# Patient Record
Sex: Female | Born: 1945
Health system: Southern US, Community
[De-identification: ages and names within clinical notes are randomized; demographics above are authoritative.]

## PROBLEM LIST (undated history)

## (undated) DIAGNOSIS — K219 Gastro-esophageal reflux disease without esophagitis: Secondary | ICD-10-CM

## (undated) DIAGNOSIS — C801 Malignant (primary) neoplasm, unspecified: Secondary | ICD-10-CM

## (undated) DIAGNOSIS — R112 Nausea with vomiting, unspecified: Secondary | ICD-10-CM

## (undated) DIAGNOSIS — F419 Anxiety disorder, unspecified: Secondary | ICD-10-CM

## (undated) DIAGNOSIS — J189 Pneumonia, unspecified organism: Secondary | ICD-10-CM

## (undated) DIAGNOSIS — R7303 Prediabetes: Secondary | ICD-10-CM

## (undated) DIAGNOSIS — K635 Polyp of colon: Secondary | ICD-10-CM

## (undated) DIAGNOSIS — E785 Hyperlipidemia, unspecified: Secondary | ICD-10-CM

## (undated) DIAGNOSIS — K5790 Diverticulosis of intestine, part unspecified, without perforation or abscess without bleeding: Secondary | ICD-10-CM

## (undated) DIAGNOSIS — Z9889 Other specified postprocedural states: Secondary | ICD-10-CM

## (undated) DIAGNOSIS — R002 Palpitations: Secondary | ICD-10-CM

## (undated) DIAGNOSIS — R000 Tachycardia, unspecified: Secondary | ICD-10-CM

## (undated) DIAGNOSIS — T7840XA Allergy, unspecified, initial encounter: Secondary | ICD-10-CM

## (undated) DIAGNOSIS — K222 Esophageal obstruction: Secondary | ICD-10-CM

## (undated) DIAGNOSIS — D649 Anemia, unspecified: Secondary | ICD-10-CM

## (undated) HISTORY — PX: UPPER GASTROINTESTINAL ENDOSCOPY: SHX188

## (undated) HISTORY — DX: Anxiety disorder, unspecified: F41.9

## (undated) HISTORY — DX: Prediabetes: R73.03

## (undated) HISTORY — PX: OTHER SURGICAL HISTORY: SHX169

## (undated) HISTORY — PX: COLONOSCOPY: SHX174

## (undated) HISTORY — DX: Tachycardia, unspecified: R00.0

## (undated) HISTORY — DX: Anemia, unspecified: D64.9

## (undated) HISTORY — PX: POLYPECTOMY: SHX149

## (undated) HISTORY — DX: Allergy, unspecified, initial encounter: T78.40XA

## (undated) HISTORY — DX: Hyperlipidemia, unspecified: E78.5

## (undated) HISTORY — PX: BREAST BIOPSY: SHX20

## (undated) HISTORY — PX: EYE SURGERY: SHX253

## (undated) HISTORY — PX: TONSILLECTOMY: SUR1361

## (undated) HISTORY — DX: Malignant (primary) neoplasm, unspecified: C80.1

## (undated) HISTORY — DX: Gastro-esophageal reflux disease without esophagitis: K21.9

## (undated) HISTORY — DX: Esophageal obstruction: K22.2

## (undated) HISTORY — DX: Polyp of colon: K63.5

## (undated) HISTORY — DX: Diverticulosis of intestine, part unspecified, without perforation or abscess without bleeding: K57.90

## (undated) HISTORY — DX: Palpitations: R00.2

---

## 1998-10-20 ENCOUNTER — Other Ambulatory Visit: Admission: RE | Admit: 1998-10-20 | Discharge: 1998-10-20 | Payer: Self-pay | Admitting: Obstetrics and Gynecology

## 1998-12-25 ENCOUNTER — Encounter: Payer: Self-pay | Admitting: Obstetrics and Gynecology

## 1998-12-25 ENCOUNTER — Ambulatory Visit (HOSPITAL_COMMUNITY): Admission: RE | Admit: 1998-12-25 | Discharge: 1998-12-25 | Payer: Self-pay | Admitting: Obstetrics and Gynecology

## 1999-05-16 ENCOUNTER — Encounter: Payer: Self-pay | Admitting: Obstetrics and Gynecology

## 1999-05-16 ENCOUNTER — Ambulatory Visit (HOSPITAL_COMMUNITY): Admission: RE | Admit: 1999-05-16 | Discharge: 1999-05-16 | Payer: Self-pay | Admitting: Obstetrics and Gynecology

## 1999-06-29 ENCOUNTER — Encounter (INDEPENDENT_AMBULATORY_CARE_PROVIDER_SITE_OTHER): Payer: Self-pay

## 1999-06-29 ENCOUNTER — Other Ambulatory Visit: Admission: RE | Admit: 1999-06-29 | Discharge: 1999-06-29 | Payer: Self-pay | Admitting: Obstetrics and Gynecology

## 1999-12-17 ENCOUNTER — Other Ambulatory Visit: Admission: RE | Admit: 1999-12-17 | Discharge: 1999-12-17 | Payer: Self-pay | Admitting: Obstetrics and Gynecology

## 2000-12-18 ENCOUNTER — Other Ambulatory Visit: Admission: RE | Admit: 2000-12-18 | Discharge: 2000-12-18 | Payer: Self-pay | Admitting: Obstetrics and Gynecology

## 2001-12-21 ENCOUNTER — Other Ambulatory Visit: Admission: RE | Admit: 2001-12-21 | Discharge: 2001-12-21 | Payer: Self-pay | Admitting: Obstetrics and Gynecology

## 2002-03-17 ENCOUNTER — Encounter: Payer: Self-pay | Admitting: Internal Medicine

## 2002-12-15 ENCOUNTER — Encounter: Payer: Self-pay | Admitting: Internal Medicine

## 2002-12-15 ENCOUNTER — Ambulatory Visit (HOSPITAL_COMMUNITY): Admission: RE | Admit: 2002-12-15 | Discharge: 2002-12-15 | Payer: Self-pay | Admitting: Internal Medicine

## 2002-12-23 ENCOUNTER — Ambulatory Visit (HOSPITAL_COMMUNITY): Admission: RE | Admit: 2002-12-23 | Discharge: 2002-12-23 | Payer: Self-pay | Admitting: Internal Medicine

## 2002-12-23 ENCOUNTER — Encounter: Payer: Self-pay | Admitting: Internal Medicine

## 2002-12-27 ENCOUNTER — Other Ambulatory Visit: Admission: RE | Admit: 2002-12-27 | Discharge: 2002-12-27 | Payer: Self-pay | Admitting: Obstetrics and Gynecology

## 2004-03-01 ENCOUNTER — Other Ambulatory Visit: Admission: RE | Admit: 2004-03-01 | Discharge: 2004-03-01 | Payer: Self-pay | Admitting: Obstetrics and Gynecology

## 2005-03-27 ENCOUNTER — Encounter: Payer: Self-pay | Admitting: Internal Medicine

## 2005-09-04 ENCOUNTER — Other Ambulatory Visit: Admission: RE | Admit: 2005-09-04 | Discharge: 2005-09-04 | Payer: Self-pay | Admitting: Obstetrics and Gynecology

## 2006-09-08 ENCOUNTER — Other Ambulatory Visit: Admission: RE | Admit: 2006-09-08 | Discharge: 2006-09-08 | Payer: Self-pay | Admitting: Obstetrics & Gynecology

## 2007-01-12 ENCOUNTER — Encounter (HOSPITAL_COMMUNITY): Admission: RE | Admit: 2007-01-12 | Discharge: 2007-03-25 | Payer: Self-pay | Admitting: Internal Medicine

## 2007-09-11 ENCOUNTER — Other Ambulatory Visit: Admission: RE | Admit: 2007-09-11 | Discharge: 2007-09-11 | Payer: Self-pay | Admitting: Obstetrics & Gynecology

## 2008-04-08 ENCOUNTER — Ambulatory Visit (HOSPITAL_COMMUNITY): Admission: RE | Admit: 2008-04-08 | Discharge: 2008-04-08 | Payer: Self-pay | Admitting: Internal Medicine

## 2008-04-08 ENCOUNTER — Encounter (INDEPENDENT_AMBULATORY_CARE_PROVIDER_SITE_OTHER): Payer: Self-pay | Admitting: *Deleted

## 2008-09-15 ENCOUNTER — Other Ambulatory Visit: Admission: RE | Admit: 2008-09-15 | Discharge: 2008-09-15 | Payer: Self-pay | Admitting: Obstetrics & Gynecology

## 2009-06-28 ENCOUNTER — Ambulatory Visit: Payer: Self-pay | Admitting: Internal Medicine

## 2009-06-28 DIAGNOSIS — K219 Gastro-esophageal reflux disease without esophagitis: Secondary | ICD-10-CM

## 2009-06-28 DIAGNOSIS — Z860100 Personal history of colon polyps, unspecified: Secondary | ICD-10-CM | POA: Insufficient documentation

## 2009-06-29 ENCOUNTER — Encounter: Payer: Self-pay | Admitting: Internal Medicine

## 2009-06-29 ENCOUNTER — Ambulatory Visit: Payer: Self-pay | Admitting: Internal Medicine

## 2009-06-30 ENCOUNTER — Encounter: Payer: Self-pay | Admitting: Internal Medicine

## 2010-06-25 ENCOUNTER — Ambulatory Visit (HOSPITAL_COMMUNITY): Admission: EM | Admit: 2010-06-25 | Discharge: 2010-06-25 | Payer: Self-pay | Admitting: Emergency Medicine

## 2010-06-25 ENCOUNTER — Ambulatory Visit: Payer: Self-pay | Admitting: Gastroenterology

## 2010-06-26 ENCOUNTER — Telehealth: Payer: Self-pay | Admitting: Internal Medicine

## 2010-06-26 ENCOUNTER — Telehealth (INDEPENDENT_AMBULATORY_CARE_PROVIDER_SITE_OTHER): Payer: Self-pay | Admitting: *Deleted

## 2010-06-27 ENCOUNTER — Encounter (INDEPENDENT_AMBULATORY_CARE_PROVIDER_SITE_OTHER): Payer: Self-pay | Admitting: *Deleted

## 2010-06-27 ENCOUNTER — Encounter: Payer: Self-pay | Admitting: Gastroenterology

## 2010-06-27 DIAGNOSIS — K222 Esophageal obstruction: Secondary | ICD-10-CM | POA: Insufficient documentation

## 2010-07-12 ENCOUNTER — Encounter: Payer: Self-pay | Admitting: Gastroenterology

## 2010-07-12 ENCOUNTER — Ambulatory Visit (HOSPITAL_COMMUNITY): Admission: RE | Admit: 2010-07-12 | Discharge: 2010-07-12 | Payer: Self-pay | Admitting: Gastroenterology

## 2010-07-12 ENCOUNTER — Encounter (INDEPENDENT_AMBULATORY_CARE_PROVIDER_SITE_OTHER): Payer: Self-pay | Admitting: *Deleted

## 2010-08-24 ENCOUNTER — Ambulatory Visit: Payer: Self-pay | Admitting: Internal Medicine

## 2011-01-08 NOTE — Assessment & Plan Note (Signed)
Summary: GERD and stricture dilation followup   History of Present Illness Visit Type: Follow-up Visit Primary GI MD: Yancey Flemings MD Primary Provider: Lucky Cowboy, MD Requesting Provider: n/a Chief Complaint: follow up after procedure, pt began taking only one prilosec daily while on a cruise.  She is doing well on only one prilosec History of Present Illness:   65 year old female withhistory of hypertension, hyperlipidemia, and GERD. She was seen by Dr. Christella Hartigan on an emergent basis June 24, 2010 with an esophageal meat impaction. This was treated endoscopically. She does have chronic GERD symptoms and was placed on Prilosec. She subsequently underwent upper endoscopy with balloon dilation to 18 mm, by Dr. Christella Hartigan, on August 4. Since that time she has done well without difficulty swallowing. She continues on Prilosec 20 mg daily. She is glad to report no heartburn or indigestion as well as new recurrent dysphagia. Her most recent colonoscopy was performed July 2010. Hyperplastic polyp and mild diverticulosis only.   GI Review of Systems    Reports acid reflux.      Denies abdominal pain, belching, bloating, chest pain, dysphagia with liquids, dysphagia with solids, heartburn, loss of appetite, nausea, vomiting, vomiting blood, weight loss, and  weight gain.      Reports diverticulosis.     Denies anal fissure, black tarry stools, change in bowel habit, constipation, diarrhea, fecal incontinence, heme positive stool, hemorrhoids, irritable bowel syndrome, jaundice, light color stool, liver problems, rectal bleeding, and  rectal pain.    Current Medications (verified): 1)  Atenolol 25 Mg Tabs (Atenolol) .... 1/2 Tablet By Mouth Once Daily 2)  Klonopin 1 Mg Tabs (Clonazepam) .... One Tablet By Mouth At Night 3)  Calcium 500 Mg Tabs (Calcium Carbonate) .... One Tablet By Mouth Once Daily 4)  Vitamin D 400 Unit Tabs (Cholecalciferol) .... One Tablet By Mouth Once Daily 5)  Fish Oil   Oil (Fish  Oil) .... One Tablet By Mouth Once Daily 6)  Multivitamins   Tabs (Multiple Vitamin) .... One Tablet By Mouth Once Daily 7)  Zetia 10 Mg Tabs (Ezetimibe) .... One Tablet By Mouth Once Daily 8)  Anafranil 50 Mg Caps (Clomipramine Hcl) .... Take 1 Capsule By Mouth Once A Day 9)  Prilosec 20 Mg Cpdr (Omeprazole) .... Take 1 Capsule By Mouth Once A Day 10)  Magnesium 30 Mg Tabs (Magnesium) .... Take 1 Tablet By Mouth Once A Day  Allergies (verified): 1)  ! Penicillin  Past History:  Past Medical History: Adenomatous Colon Polyps Hypertension Hyperthyroidism Irritable Bowel Syndrome Urinary Tract Infection Esophageal Stricture  Past Surgical History: c-section eye lift  Tonsillectomy  Family History: Reviewed history from 06/28/2009 and no changes required. Family History of Colon Cancer:Maternal Aunt and Paternal Aunt  Family History of Diabetes: Half sister  Family History of Kidney Disease:Father  Family History of Heart Disease: Mother   Social History: Reviewed history from 06/28/2009 and no changes required. Occupation: Retired Married 1 boy Patient has never smoked.  Alcohol Use - no Daily Caffeine Use Illicit Drug Use - no Patient does not get regular exercise.   Review of Systems  The patient denies allergy/sinus, anemia, anxiety-new, arthritis/joint pain, back pain, blood in urine, breast changes/lumps, confusion, cough, coughing up blood, depression-new, fainting, fatigue, fever, headaches-new, hearing problems, heart murmur, heart rhythm changes, itching, menstrual pain, muscle pains/cramps, night sweats, nosebleeds, pregnancy symptoms, shortness of breath, skin rash, sleeping problems, sore throat, swelling of feet/legs, swollen lymph glands, thirst - excessive, urination - excessive, urination changes/pain,  urine leakage, vision changes, and voice change.    Vital Signs:  Patient profile:   65 year old female Height:      67 inches Weight:      166  pounds BMI:     26.09 Pulse rate:   76 / minute Pulse rhythm:   regular BP sitting:   86 / 60  (left arm) Cuff size:   regular  Vitals Entered By: Francee Piccolo CMA Duncan Dull) (August 24, 2010 2:39 PM)  Physical Exam  General:  Well developed, well nourished, no acute distress. Head:  Normocephalic and atraumatic. Eyes:  PERRLA, no icterus. Nose:  No deformity, discharge,  or lesions. Mouth:  No deformity or lesions, dentition normal. Neck:  Supple; no masses or thyromegaly. Lungs:  Clear throughout to auscultation. Heart:  Regular rate and rhythm; no murmurs, rubs,  or bruits. Abdomen:  Soft, nontender and nondistended. No masses, hepatosplenomegaly or hernias noted. Normal bowel sounds. Msk:  normal posture Pulses:  Normal pulses noted. Extremities:  no edema Neurologic:  Alert and  oriented x4. Skin:  Intact without significant lesions or rashes. Psych:  Alert and cooperative. Normal mood and affect.   Impression & Recommendations:  Problem # 1:  GERD (ICD-530.81) chronic GERD complicated by peptic stricture. Symptoms of heartburn resolved on PPI therapy.  Plan: #1. Omeprazole 20 mg daily. A prescription with multiple refills submitted electronically #2. Discussion on the pathophysiology and treatment of GERD #3. Literature on GERD provided for review #4. Reflux precautions #5. Routine followup in one year. Sooner for breakthrough symptoms or other problems  Problem # 2:  ESOPHAGEAL STRICTURE (ICD-530.3) peptic stricture complicated by a food impaction. Status post endoscopic removal of food impaction followed by endoscopic balloon dilation of the esophagus  Plan: #1. Continue PPI daily to reduce the risk of premature stricture recurrence #2. Chew food well #3. Contact the office should problems with dysphagia return  Problem # 3:  SCREENING COLORECTAL-CANCER (ICD-V76.51) up-to-date. Hyperplastic polyp and mild diverticulosis on colonoscopy July 2010  Patient  Instructions: 1)  Omeprazole 20 mg #30 x 11 RFs. take 1 by mouth once daily 2)  GI Reflux brochure given.  3)  Esophagitis and Stricture brochure given. 4)  Copy sent to : Lucky Cowboy, MD 5)  The medication list was reviewed and reconciled.  All changed / newly prescribed medications were explained.  A complete medication list was provided to the patient / caregiver. Prescriptions: PRILOSEC 20 MG CPDR (OMEPRAZOLE) Take 1 capsule by mouth once a day  #30 x 11   Entered by:   Milford Cage NCMA   Authorized by:   Hilarie Fredrickson MD   Signed by:   Milford Cage NCMA on 08/24/2010   Method used:   Electronically to        [facility]* (retail)       [address]       [place], [place]  [id number]       Ph: [phone]       Fax: [phone]   RxID:   [id number]

## 2011-01-08 NOTE — Progress Notes (Signed)
Summary: [facility]  ---- Converted from flag ---- ---- 06/26/2010 1:42 PM, Hilarie Fredrickson MD wrote: yes, thank you. Make sure she stays on PPI twice daily. Schedule her for EGD with dilation and [facility] in 2-4 weeks. Thanks  ---- 06/26/2010 1:29 PM, Chales Abrahams CMA (AAMA) wrote: Lorain Childes:  Dr Marina Goodell this is a pt of yours that Dr Christella Hartigan saw in the hospital would you like to have her scheduled with you.  ---- 06/26/2010 12:55 PM, Rachael Fee MD wrote: yes, can you show him the EGD report, he will probably want to dilate her but often does these at [facility].  I wasn't aware Marina Goodell was her doc.  ---- 06/26/2010 9:09 AM, Chales Abrahams CMA (AAMA) wrote: Dr Christella Hartigan this is a Dr Marina Goodell pt do you want to schedule the repeat EGD DIL with you or with Dr Marina Goodell? ------------------------------

## 2011-01-08 NOTE — Letter (Signed)
Summary: EGD Instructions  [facility]  [address] [place], [place] [address]   Phone: [phone]  Fax: [phone]       [name]    21-Jul-1946    MRN: [id number]       Procedure Day /Date:07/12/10     Arrival Time: 8am     Procedure Time:9am     Location of Procedure:                    _  _ [facility] (4th Floor) _ X _ [facility] ( Outpatient Registration) _  _ [facility] (Short Stay on [address]   PREPARATION FOR ENDOSCOPY   On Thursday THE DAY OF THE PROCEDURE:  1.   No solid foods, milk or milk products are allowed after midnight the night before your procedure.  2.   Do not drink anything colored red or purple.  Avoid juices with pulp.  No orange juice.  3.  You may drink clear liquids unti 5 a.m. which is 4 hours before your procedure.                                                                                                CLEAR LIQUIDS INCLUDE: Water Jello Ice Popsicles Tea (sugar ok, no milk/cream) Powdered fruit flavored drinks Coffee (sugar ok, no milk/cream) Gatorade Juice: apple, white grape, white cranberry  Lemonade Clear bullion, consomm, broth Carbonated beverages (any kind) Strained chicken noodle soup Hard Candy   MEDICATION INSTRUCTIONS  Unless otherwise instructed, you should take regular prescription medications with a small sip of water as early as possible the morning of your procedure.            OTHER INSTRUCTIONS  You will need a responsible adult at least 64 years of age to accompany you and drive you home.   This person must remain in the waiting room during your procedure.  Wear loose fitting clothing that is easily removed.  Leave jewelry and other valuables at home.  However, you may wish to bring a book to read or an iPod/MP3 player to listen to music as you wait for your procedure to start.  Remove all body piercing jewelry and leave at  home.  Total time from sign-in until discharge is approximately 2-3 hours.  You should go home directly after your procedure and rest.  You can resume normal activities the day after your procedure.  The day of your procedure you should not:   Drive   Make legal decisions   Operate machinery   Drink alcohol   Return to work  You will receive specific instructions about eating, activities and medications before you leave.    The above instructions have been reviewed and explained to me by   Fischer Halley-mailed to pt.-06/27/10

## 2011-01-08 NOTE — Letter (Signed)
Summary: Appt Reminder 2  [facility]  [address] [place], [place] [address]   Phone: [phone]  Fax: [phone]        July 12, 2010 MRN: [id number]    Donna Dillon [address] [place], [place]  [address]    Dear [name],   You have a return appointment with Dr. Marina Goodell on 08/20/10 at 10:15 am.  Please remember to bring a complete list of the medicines you are taking, your insurance card and your co-pay.  If you have to cancel or reschedule this appointment, please call before 5:00 pm the evening before to avoid a cancellation fee.  If you have any questions or concerns, please call [phone].    Sincerely,    Chales Abrahams CMA (AAMA)  Appended Document: Appt Reminder 2 letter mailed

## 2011-01-08 NOTE — Procedures (Signed)
Summary: Upper Endoscopy  Patient: Donna Dillon Note: All result statuses are Final unless otherwise noted.  Tests: (1) Upper Endoscopy (EGD)   EGD Upper Endoscopy       DONE     [facility]     [address]     [place], [place]  [address]           ENDOSCOPY PROCEDURE REPORT           PATIENT:  [name], [name]  MR#:  [id number]     BIRTHDATE:  01-22-1946  GENDER:  female     ENDOSCOPIST:  Rachael Fee, MD     PROCEDURE DATE:  06/25/2010     PROCEDURE:  EGD with foreign body removal     ASA CLASS:  Class II     INDICATIONS:  esophageal food impaction     MEDICATIONS:  Versed 6 mg IV, Fentanyl 50 mcg IV, Benadryl 50 mg     IV     TOPICAL ANESTHETIC:  Exactacain Spray           DESCRIPTION OF PROCEDURE:   After the risks benefits and     alternatives of the procedure were thoroughly explained, informed     consent was obtained.  The  endoscope was introduced through the     mouth and advanced to the second portion of the duodenum, without     limitations.  The instrument was slowly withdrawn as the mucosa     was fully examined.     <<PROCEDUREIMAGES>>           There was a large amount of soft, whitish meat impacted in her mid     to distal esophagus. This was gently pushed into the stomach with     passage of gastroscope. There was noted to be a 3-4cm hiatal     hernia. Above the hernia, there was a thick Schatzki's ring vs a     focal peptic stricture. The GE junction was quite tortuous and     there was clearly inflammed GE junction, very friable from recent     food impaction (see image1, image2, image3, image4, image5,     image6, and image7).  Otherwise the examination was normal (see     image8, image9, image10, and image11).    Retroflexed views     revealed no abnormalities.    The scope was then withdrawn from     the patient and the procedure completed.           COMPLICATIONS:  None           ENDOSCOPIC IMPRESSION:     1) Food impaction  in esophagus, treated.   Schaztki's ring vs.     thick peptic stricture.  Hiatal hernia.     2) Otherwise normal examination           RECOMMENDATIONS:     Liquid diet for next 24-48 hours.     Twice daily prilosec (OTC), best taken 20-30 min before     breakfast and dinner meals.     EAT SLOWLY AND CHEW VERY WELL.     Dr. Christella Hartigan office will get in touch to repeat EGD with dilation     in 2-3 weeks at [facility].           ______________________________     Rachael Fee, MD           cc:  Chrissie Noa  [place]           n.     eSIGNED:   Rachael Fee at 06/25/2010 09:35 PM           Page 2 of 3   [place], [id number]  Note: An exclamation mark (!) indicates a result that was not dispersed into the flowsheet. Document Creation Date: 06/25/2010 9:50 PM _______________________________________________________________________  (1) Order result status: Final Collection or observation date-time: 06/25/2010 21:26 Requested date-time:  Receipt date-time:  Reported date-time:  Referring Physician:   Ordering Physician: Rob Bunting [phone]) Specimen Source:  Source: Launa Grill Order Number: [id number] Lab site:   Appended Document: Upper Endoscopy Dr Marina Goodell pt see phone note from 06/26/10

## 2011-01-08 NOTE — Miscellaneous (Signed)
Summary: egd/dil  Clinical Lists Changes  Problems: Added new problem of ESOPHAGEAL STRICTURE (ICD-530.3) Orders: Added new Test order of ZEGD Balloon Dil (ZEGD Balloon) - Signed

## 2011-01-08 NOTE — Progress Notes (Signed)
Summary: f/u EGD  Phone Note Call from Patient Call back at Home Phone [phone]   Caller: Patient Call For: Dr. Marina Goodell Reason for Call: Talk to Nurse Summary of Call: would like to sch f/u EGD asap Initial call taken by: Vallarie Mare,  June 26, 2010 2:55 PM  Follow-up for Phone Call        I scheduled this pt. for next available which is 07/30/10 in [facility] however she is going out of  the country on 07/23/10 and also is saying it has to be done at hospital because she had to pay $600.00 when you did her colonoscopy here. Follow-up by: Teryl Lucy RN,  June 26, 2010 3:18 PM  Additional Follow-up for Phone Call Additional follow up Details #1::        have our business people look into the payment issues to see if the [facility] is an option. I have no routine hospital availability until October. Additional Follow-up by: Hilarie Fredrickson MD,  June 26, 2010 3:33 PM    Additional Follow-up for Phone Call Additional follow up Details #2::    I checked with [facility] in pre-cert. and pt. has $600.00 deductible regardless of  whether procedure is done here or at [facility] procedure and her deductible just started over 06/08/2010. Pt. is flying to [place] then a 3 week cruise in the [place] and is asking if someone else could do dil. as she is very concerned that if choking would re-occur on ship she would not get adequate care  there or in another country. Follow-up by: Teryl Lucy RN,  June 26, 2010 4:04 PM  Additional Follow-up for Phone Call Additional follow up Details #3:: Details for Additional Follow-up Action Taken: I guess that you could check with Dr. Christella Hartigan since he saw her recently Additional Follow-up by: Hilarie Fredrickson MD,  June 26, 2010 4:17 PM  Dr.Jacobs would you consider doing this pt's. dil before she leaves the country on 812/11 since Dr.Cayenne Breault has no openings?    I can do this on thursday, august 4th, EGD with balloon dilation at [facility]. Rachael Fee MD  June 27, 2010 10:42  AM  Appended Document: f/u EGD Pt. scheduled at [facility] for dil with Dr.Jacobs.Is aware and instructions reviwed and mailed to pt.

## 2011-01-08 NOTE — Procedures (Signed)
Summary: Upper Endoscopy  Patient: Donna Dillon Note: All result statuses are Final unless otherwise noted.  Tests: (1) Upper Endoscopy (EGD)   EGD Upper Endoscopy       DONE     [facility]     [address] [place], [place]  [address]           ENDOSCOPY PROCEDURE REPORT           PATIENT:  Donna Dillon, Donna Dillon  MR#:  [id number]     BIRTHDATE:  1946-07-14, 64 yrs. old  GENDER:  female     ENDOSCOPIST:  Rachael Fee, MD     PROCEDURE DATE:  07/12/2010     PROCEDURE:  EGD with balloon dilatation     ASA CLASS:  Class II     INDICATIONS:  intermittent dysphagia, chronic GERD; recent food     impaction 3 weeks ago treated endoscopically     MEDICATIONS:  Fentanyl 75 mcg IV, Versed 8 mg IV, Benadryl 50 mg     IV     TOPICAL ANESTHETIC:  Cetacaine Spray           DESCRIPTION OF PROCEDURE:   After the risks benefits and     alternatives of the procedure were thoroughly explained, informed     consent was obtained.  The  endoscope was introduced through the     mouth and advanced to the stomach antrum, without limitations.     The instrument was slowly withdrawn as the mucosa was fully     examined.     <<PROCEDUREIMAGES>>     A Schatzki's ring was found. This was dilated with CRE TTS balloon     held inflated to 18mm for 60 seconds. There was the usual minor     superficial mucosal tear and self limited oozing of blood (see     image3, image10, and image11).  A hiatal hernia was found. This     was 4-5cm (see image5 and image2). There was a moderate amount of     retained solid food in stomach without anatomic gastric outlet     obstruction (see image4).    Retroflexed views revealed no     abnormalities.    The scope was then withdrawn from the patient     and the procedure completed.     COMPLICATIONS:  None           ENDOSCOPIC IMPRESSION:     1) Schatzki's ring, dilated up to 18mm with CRE TTS balloon     2) 4-5cm hiatal hernia     3) Moderate amount of  retained solid food in stomach without     anatomic gastric outlet obstruction           RECOMMENDATIONS:     Continue on twice daily PPI (prilosec taken 20-30 min prior to     breakfast and dinner meals).     Continue to chew food well, eat slowly.     Will arrange for follow up appt with Dr. Marina Goodell in the office in     5-6 weeks to check on response to this dilation and schedule     repeat dilation if needed.  This will be after your vacation trip.                 ______________________________     Rachael Fee, MD           cc: Yancey Flemings,  MD; Lucky Cowboy, MD           n.     Rosalie Doctor:   Rachael Fee at 07/12/2010 08:51 AM           [place], [id number]  Note: An exclamation mark (!) indicates a result that was not dispersed into the flowsheet. Document Creation Date: 07/12/2010 8:52 AM _______________________________________________________________________  (1) Order result status: Final Collection or observation date-time: 07/12/2010 08:42 Requested date-time:  Receipt date-time:  Reported date-time:  Referring Physician:   Ordering Physician: Rob Bunting [phone]) Specimen Source:  Source: Launa Grill Order Number: [id number] Lab site:   Appended Document: Upper Endoscopy patty, can you set up ROV with Dr. Marina Goodell in 5-6 weeks  Appended Document: Upper Endoscopy Appt scheduled and letter mailed to pt

## 2011-02-23 LAB — COMPREHENSIVE METABOLIC PANEL
AST: 16 U/L (ref 0–37)
Albumin: 4.1 g/dL (ref 3.5–5.2)
Alkaline Phosphatase: 73 U/L (ref 39–117)
Chloride: 105 mEq/L (ref 96–112)
GFR calc Af Amer: >60 mL/min (ref >60)
Potassium: 3.6 mEq/L (ref 3.5–5.1)
Total Bilirubin: 0.5 mg/dL (ref 0.3–1.2)
Total Protein: 7 g/dL (ref 6.0–8.3)

## 2011-02-23 LAB — URINALYSIS, ROUTINE W REFLEX MICROSCOPIC
Bilirubin Urine: NEGATIVE
Glucose, UA: NEGATIVE mg/dL
Hgb urine dipstick: NEGATIVE
Ketones, ur: NEGATIVE mg/dL
pH: 8.5 — ABNORMAL HIGH (ref 5.0–8.0)

## 2011-02-23 LAB — URINE CULTURE
Colony Count: NO GROWTH
Culture: NO GROWTH

## 2011-02-23 LAB — POCT CARDIAC MARKERS
Myoglobin, poc: 52 ng/mL (ref 12–200)
Myoglobin, poc: 58.5 ng/mL (ref 12–200)
Troponin i, poc: <0.05 ng/mL (ref 0.00–0.09)

## 2011-02-23 LAB — DIFFERENTIAL
Basophils Absolute: 0 10*3/uL (ref 0.0–0.1)
Basophils Relative: 1 % (ref 0–1)
Eosinophils Relative: 2 % (ref 0–5)
Lymphocytes Relative: 31 % (ref 12–46)
Monocytes Absolute: 0.5 10*3/uL (ref 0.1–1.0)
Monocytes Relative: 8 % (ref 3–12)

## 2011-02-23 LAB — CBC
Platelets: 217 10*3/uL (ref 150–400)
RBC: 4.51 MIL/uL (ref 3.87–5.11)
RDW: 13.1 % (ref 11.5–15.5)
WBC: 6.1 10*3/uL (ref 4.0–10.5)

## 2011-07-30 ENCOUNTER — Ambulatory Visit (HOSPITAL_COMMUNITY)
Admission: RE | Admit: 2011-07-30 | Discharge: 2011-07-30 | Disposition: A | Discharge status: Home / Self Care | Payer: BC Managed Care – PPO | Source: Ambulatory Visit | Attending: Internal Medicine | Admitting: Internal Medicine

## 2011-07-30 ENCOUNTER — Other Ambulatory Visit (HOSPITAL_COMMUNITY): Payer: Self-pay | Admitting: Internal Medicine

## 2011-07-30 DIAGNOSIS — W19XXXA Unspecified fall, initial encounter: Secondary | ICD-10-CM | POA: Insufficient documentation

## 2011-07-30 DIAGNOSIS — T1490XA Injury, unspecified, initial encounter: Secondary | ICD-10-CM

## 2011-07-30 DIAGNOSIS — S8000XA Contusion of unspecified knee, initial encounter: Secondary | ICD-10-CM | POA: Insufficient documentation

## 2011-07-30 DIAGNOSIS — M25569 Pain in unspecified knee: Secondary | ICD-10-CM | POA: Insufficient documentation

## 2011-09-15 ENCOUNTER — Other Ambulatory Visit: Payer: Self-pay | Admitting: Internal Medicine

## 2011-12-20 IMAGING — CR DG CHEST 1V PORT
1 series · 1 of 1 positions shown · non-contrast
Comparison: None.

CLINICAL DATA: Chest pain

PORTABLE CHEST - 1 VIEW

[view not recorded]
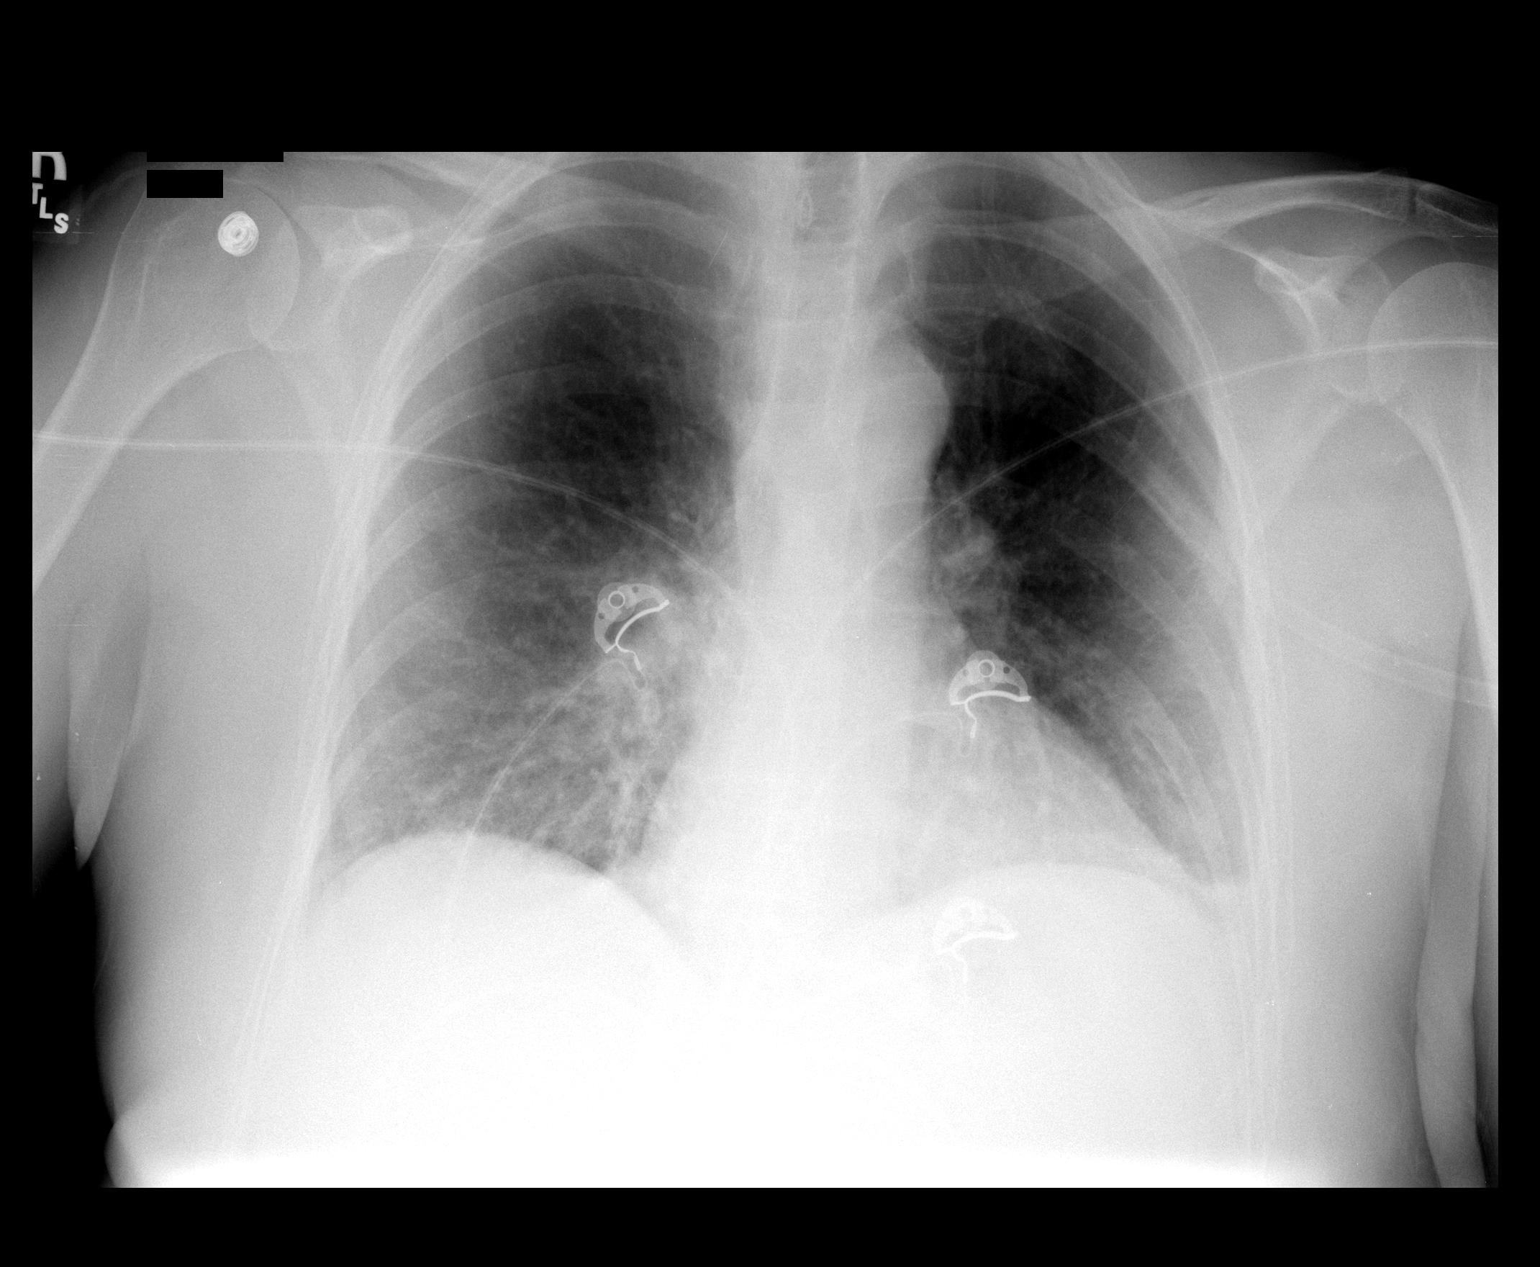

[1 of 1 positions shown; findings below may reference images not displayed]

FINDINGS: Heart size within normal limits.  Accentuated basilar
markings which are likely chronic.  No definite heart failure or
pneumonia in one-view.
IMPRESSION: Prominent basilar markings - most likely chronic.  No definite
active disease in one-view.

## 2012-01-22 LAB — HM PAP SMEAR

## 2012-07-13 ENCOUNTER — Other Ambulatory Visit: Payer: Self-pay | Admitting: Internal Medicine

## 2012-07-17 ENCOUNTER — Other Ambulatory Visit: Payer: Self-pay

## 2012-07-17 MED ORDER — OMEPRAZOLE MAGNESIUM 20 MG PO TBEC: 20.0000 mg | DELAYED_RELEASE_TABLET | Freq: Every day | ORAL | Status: DC

## 2012-07-17 NOTE — Telephone Encounter (Signed)
Refilled Prilosec

## 2012-08-28 ENCOUNTER — Telehealth: Payer: Self-pay | Admitting: Internal Medicine

## 2012-08-28 NOTE — Telephone Encounter (Signed)
Pt states that she has been having rectal bleeding. States that she has had some episodes of bleeding after long walks, just bright red blood. Pt states she has some external hemorrhoids but they are not irritated. Pt very concerned. Scheduled her to see Dr. Marina Goodell 08/31/12@11am . Pt aware of appt date and time.

## 2012-08-31 ENCOUNTER — Encounter: Payer: Self-pay | Admitting: Internal Medicine

## 2012-08-31 ENCOUNTER — Ambulatory Visit (INDEPENDENT_AMBULATORY_CARE_PROVIDER_SITE_OTHER): Payer: BC Managed Care – PPO | Admitting: Internal Medicine

## 2012-08-31 VITALS — BP 100/62 | HR 68 | Ht 67.0 in | Wt 157.2 lb

## 2012-08-31 DIAGNOSIS — K625 Hemorrhage of anus and rectum: Secondary | ICD-10-CM

## 2012-08-31 DIAGNOSIS — Z86010 Personal history of colonic polyps: Secondary | ICD-10-CM

## 2012-08-31 DIAGNOSIS — K219 Gastro-esophageal reflux disease without esophagitis: Secondary | ICD-10-CM

## 2012-08-31 DIAGNOSIS — K222 Esophageal obstruction: Secondary | ICD-10-CM

## 2012-08-31 MED ORDER — HYDROCORTISONE ACETATE 25 MG RE SUPP: 25.0000 mg | Freq: Every evening | RECTAL | Status: DC | PRN

## 2012-08-31 NOTE — Patient Instructions (Addendum)
We have sent the following medications to your pharmacy for you to pick up at your convenience: Anusol Suppositories  

## 2012-08-31 NOTE — Progress Notes (Signed)
HISTORY OF PRESENT ILLNESS:  Donna Dillon is a 66 y.o. female who is followed in this office for GERD complicated by peptic stricture and colon polyps. She presents today with a chief complaint of intermittent rectal bleeding that she has had for several years. Actually, had colonoscopy in 2010 to evaluate the same. She does have a history of colonic adenomas on prior colonoscopy with Dr. Barnett Abu in 2003. Her followup with him was in 2006. As mentioned, last colonoscopy July 2010 with hyperplastic polyp. She mentions know that she will noticed some red blood on her undergarments as well as fecal soilage after taking long walks. She generally walks to 3 miles per day. She occasionally has diarrhea. She denies abdominal pain or weight loss. For GERD she continues on Prilosec. No dysphagia. No breakthrough symptoms  REVIEW OF SYSTEMS:  All non-GI ROS negative except for  No past medical history on file.  No past surgical history on file.  Social History Normie Birks  reports that she has never smoked. She has never used smokeless tobacco. She reports that she does not drink alcohol or use illicit drugs.  family history is not on file.  Allergies  Allergen Reactions  . Penicillins   . Prednisone     Altered mental state       PHYSICAL EXAMINATION: Vital signs: BP 100/62  Pulse 68  Ht 5\' 7"  (1.702 m)  Wt 157 lb 3.2 oz (71.305 kg)  BMI 24.62 kg/m2  Constitutional: generally well-appearing, no acute distress Psychiatric: alert and oriented x3, cooperative Eyes: extraocular movements intact, anicteric, conjunctiva pink Mouth: oral pharynx moist, no lesions Neck: supple no lymphadenopathy Cardiovascular: heart regular rate and rhythm, no murmur Lungs: clear to auscultation bilaterally Abdomen: soft, nontender, nondistended, no obvious ascites, no peritoneal signs, normal bowel sounds, no organomegaly Rectal:external hemorrhoids. No tenderness or mass. Stools brown and  Hemoccult negative Extremities: no lower extremity edema bilaterally Skin: no lesions on visible extremities Neuro: No focal deficits.   ASSESSMENT:  #1. Chronic minor rectal bleeding due to hemorrhoids #2. History of adenomatous colon polyps #3. GERD complicated by peptic stricture   PLAN:  #1. Metamucil 1 tablespoon daily #2. Anusol-HC suppositories at night as needed. Prescribed #3. Reflux precautions #4. Continue PPI #5. Surveillance colonoscopy around July 2015. Interval followup as needed

## 2012-09-11 ENCOUNTER — Telehealth: Payer: Self-pay | Admitting: Internal Medicine

## 2012-09-11 NOTE — Telephone Encounter (Signed)
Patient is still having rectal bleeding.  She was started on anusol suppositories on 08/31/12.  Patient also reports that she is not able to completely evacuate her bowels and has noticed mucous in the last few days.  She wants to come back and discuss a colonoscopy.  She is offered an appt for 09/25/12 with Dr. Marina Goodell or come see Willette Cluster RNP on 09/14/12.  She will come in and be seen on 09/14/12 1:30 with Willette Cluster RNP

## 2012-09-14 ENCOUNTER — Ambulatory Visit: Payer: Medicare Other | Admitting: Nurse Practitioner

## 2012-10-06 ENCOUNTER — Other Ambulatory Visit: Payer: Self-pay

## 2012-10-06 MED ORDER — OMEPRAZOLE MAGNESIUM 20 MG PO TBEC: 20.0000 mg | DELAYED_RELEASE_TABLET | Freq: Every day | ORAL | Status: DC

## 2012-10-06 NOTE — Telephone Encounter (Signed)
Refilled Prilosec

## 2012-12-31 LAB — HM MAMMOGRAPHY

## 2013-02-24 ENCOUNTER — Encounter: Payer: Self-pay | Admitting: Obstetrics & Gynecology

## 2013-02-24 ENCOUNTER — Encounter: Payer: Self-pay | Admitting: Obstetrics and Gynecology

## 2013-02-25 ENCOUNTER — Ambulatory Visit (INDEPENDENT_AMBULATORY_CARE_PROVIDER_SITE_OTHER): Payer: 59 | Admitting: Obstetrics and Gynecology

## 2013-02-25 ENCOUNTER — Encounter: Payer: Self-pay | Admitting: Obstetrics and Gynecology

## 2013-02-25 VITALS — BP 122/78 | Ht 66.5 in | Wt 162.0 lb

## 2013-02-25 DIAGNOSIS — M8580 Other specified disorders of bone density and structure, unspecified site: Secondary | ICD-10-CM | POA: Insufficient documentation

## 2013-02-25 NOTE — Patient Instructions (Signed)

## 2013-02-25 NOTE — Progress Notes (Signed)
Patient ID: Donna Dillon, female   DOB: [birth year], 67 y.o.   MRN: [id number]  67 y.o. MarriedCaucasian female   G1P1001 here for annual exam.  Denies problems.  No bladder or bowel function problems.  Has rectal bleeding after exercise.  Saw Dr. Marina Goodell, GI, who did anoscopy.  Dx with hemorrhoids.  Patient's last menstrual period was 12/09/1994.          Sexually active: no  Husband with health problems.  Patient with vaginal dryness.  Used vaginal estrogen in the past.   The current method of family planning is post menopausal status.    Exercising: yes  Home exercise routine includes treadmill.  Will start weights. Last mammogram:  12/31/12 WNL.  Does not do self breast exam.  Wants check of left breast. Last pap smear:  01/16/12 WNL.  No HR HPV done. Last colonoscopy: 5 - 6 years ago, Dr. Marina Goodell Last Bone Density:  October 2008 - osteopenia.  Urine:    Health Maintenance  Topic Date Due  . Influenza Vaccine  08/10/1946  . Pneumococcal Polysaccharide Vaccine Age 59 And Over  02/19/2012  . Mammogram  12/31/2014  . Tetanus/tdap  12/10/2015  . Colonoscopy  06/30/2019  . Zostavax  Completed    Family History  Problem Relation Age of Onset  . Kidney cancer Father   . Hypertension Mother   . Heart disease Mother     Patient Active Problem List  Diagnosis  . ESOPHAGEAL STRICTURE  . GERD  . RECTAL BLEEDING  . FLATULENCE-GAS-BLOATING  . PERSONAL HX COLONIC POLYPS    Past Medical History  Diagnosis Date  . Hyperlipidemia   . GERD (gastroesophageal reflux disease)   . Esophageal stricture   . Diverticulosis   . Colon polyp     hyperplastic  . Anemia     mild  . Tachycardia     Past Surgical History  Procedure Laterality Date  . Tonsillectomy    . Breast biopsy Left   . Cesarean section      x1    Allergies: Penicillins and Prednisone  Current Outpatient Prescriptions  Medication Sig Dispense Refill  . atenolol (TENORMIN) 25 MG tablet Take 12.5 mg by mouth  daily.      . Calcium 1500 MG tablet Take 1,500 mg by mouth.      . cholecalciferol (VITAMIN D) 1000 UNITS tablet Take 1,000 Units by mouth daily.      . clomiPRAMINE (ANAFRANIL) 50 MG capsule Take 50 mg by mouth at bedtime.      . clonazePAM (KLONOPIN) 1 MG tablet Take 1 mg by mouth at bedtime.      Marland Kitchen ezetimibe (ZETIA) 10 MG tablet Take 10 mg by mouth daily.      . fish oil-omega-3 fatty acids 1000 MG capsule Take 1 g by mouth daily.      . Loratadine (CLARITIN PO) Take by mouth as needed.      . magnesium 30 MG tablet Take 30 mg by mouth 2 (two) times daily.      . Multiple Vitamin (MULTIVITAMIN) tablet Take 1 tablet by mouth daily.      Marland Kitchen omeprazole (PRILOSEC OTC) 20 MG tablet Take 1 tablet (20 mg total) by mouth daily.  30 tablet  3  . vitamin C (ASCORBIC ACID) 500 MG tablet Take 500 mg by mouth daily.      . hydrocortisone (ANUSOL-HC) 25 MG suppository Place 1 suppository (25 mg total) rectally at bedtime as needed for  hemorrhoids.  30 suppository  4   No current facility-administered medications for this visit.    ROS: Pertinent items are noted in HPI.  Exam:    BP 122/78  Ht 5' 6.5" (1.689 m)  Wt 162 lb (73.483 kg)  BMI 25.76 kg/m2  LMP 12/09/1994   Wt Readings from Last 3 Encounters:  02/25/13 162 lb (73.483 kg)  08/31/12 157 lb 3.2 oz (71.305 kg)  08/24/10 166 lb (75.297 kg)     Ht Readings from Last 3 Encounters:  02/25/13 5' 6.5" (1.689 m)  08/31/12 5\' 7"  (1.702 m)  08/24/10 5\' 7"  (1.702 m)    General appearance: alert, cooperative and appears stated age Head: Normocephalic, without obvious abnormality, atraumatic Neck: no adenopathy, supple, symmetrical, trachea midline and thyroid not enlarged, symmetric, no tenderness/mass/nodules Lungs: clear to auscultation bilaterally Breasts: Inspection negative, No nipple retraction or dimpling, No nipple discharge or bleeding, No axillary or supraclavicular adenopathy, Normal to palpation without dominant masses Heart:  regular rate and rhythm Abdomen: soft, non-tender; bowel sounds normal; no masses,  no organomegaly Extremities: extremities normal, atraumatic, no cyanosis or edema Skin: Skin color, texture, turgor normal. No rashes or lesions Lymph nodes: Cervical, supraclavicular, and axillary nodes normal. No abnormal inguinal nodes palpated Neurologic: Grossly normal   Pelvic: External genitalia:  no lesions              Urethra:  normal appearing urethra with no masses, tenderness or lesions              Bartholins and Skenes: normal                 Vagina: normal appearing vagina with normal color and discharge, no lesions              Cervix: normal appearance              Pap taken: no        Bimanual Exam:  Uterus:  uterus is normal size, shape, consistency and nontender                                      Adnexa: normal adnexa in size, nontender and no masses                                      Rectovaginal: Confirms                                      Anus:  normal sphincter tone, no lesions  Urine dip - negative.  A: well woman     Osteopenia  P: Self breast exams monthly. Bone density at [facility]. return annually or prn     An After Visit Summary was printed and given to the patient.

## 2013-03-03 ENCOUNTER — Telehealth: Payer: Self-pay | Admitting: *Deleted

## 2013-03-03 NOTE — Telephone Encounter (Signed)
Per Amy faxed form to [facility] for appt. For Bone Density. Diagnosis Osteopenia. Form faxed 03/02/2013 @ 1350. [employer] to notify patient of date and time.

## 2013-03-08 ENCOUNTER — Telehealth: Payer: Self-pay

## 2013-03-08 MED ORDER — OMEPRAZOLE MAGNESIUM 20 MG PO TBEC: 20.0000 mg | DELAYED_RELEASE_TABLET | Freq: Every day | ORAL | Status: DC

## 2013-03-08 NOTE — Telephone Encounter (Signed)
Refilled Omeprazole through [facility]

## 2013-08-16 ENCOUNTER — Telehealth: Payer: Self-pay

## 2013-08-16 MED ORDER — OMEPRAZOLE MAGNESIUM 20 MG PO TBEC: 20.0000 mg | DELAYED_RELEASE_TABLET | Freq: Every day | ORAL | Status: DC

## 2013-08-16 NOTE — Telephone Encounter (Signed)
Refilled Omeprazole 

## 2013-11-09 ENCOUNTER — Telehealth: Payer: Self-pay | Admitting: Internal Medicine

## 2013-11-09 ENCOUNTER — Other Ambulatory Visit: Payer: Self-pay | Admitting: Physician Assistant

## 2013-11-09 MED ORDER — ATENOLOL 25 MG PO TABS: ORAL_TABLET | ORAL | Status: DC

## 2013-11-09 NOTE — Telephone Encounter (Signed)
New rx of Atenolol 25 mg 1/2 to 1 tab daily 30 day qty. To [facility].  No longer using [facility] mail order. Please advise pt and pharm.  Next appt: 03-08-14 CPE Dr Madison Hickman  Thanks Katrina

## 2013-12-07 ENCOUNTER — Ambulatory Visit (INDEPENDENT_AMBULATORY_CARE_PROVIDER_SITE_OTHER): Payer: 59 | Admitting: Internal Medicine

## 2013-12-07 ENCOUNTER — Encounter: Payer: Self-pay | Admitting: Internal Medicine

## 2013-12-07 VITALS — BP 116/70 | HR 72 | Temp 98.4°F | Resp 16 | Wt 162.0 lb

## 2013-12-07 DIAGNOSIS — J0100 Acute maxillary sinusitis, unspecified: Secondary | ICD-10-CM

## 2013-12-07 MED ORDER — LEVOFLOXACIN 500 MG PO TABS: 500.0000 mg | ORAL_TABLET | Freq: Every day | ORAL | Status: AC

## 2013-12-07 MED ORDER — DEXAMETHASONE 0.5 MG PO TABS: ORAL_TABLET | ORAL | Status: DC

## 2013-12-07 NOTE — Patient Instructions (Signed)

## 2013-12-07 NOTE — Progress Notes (Signed)
   Subjective:    Patient ID: Donna Dillon, female    DOB: 02-27-1946, 67 y.o.   MRN: [id number]  Sinusitis This is a chronic problem. The current episode started 1 to 4 weeks ago. The problem has been rapidly improving since onset. There has been no fever. Her pain is at a severity of 4/10. Associated symptoms include congestion, coughing, headaches, sinus pressure and a sore throat. Pertinent negatives include no chills, diaphoresis, ear pain, hoarse voice, neck pain, shortness of breath, sneezing or swollen glands. Past treatments include nothing.  Ear Fullness  Associated symptoms include coughing, headaches and a sore throat. Pertinent negatives include no neck pain or rhinorrhea.  Cough Associated symptoms include headaches and a sore throat. Pertinent negatives include no chills, ear pain, fever, postnasal drip, rhinorrhea, shortness of breath or wheezing.      Review of Systems  Constitutional: Negative.  Negative for fever, chills and diaphoresis.  HENT: Positive for congestion, sinus pressure and sore throat. Negative for ear pain, hoarse voice, mouth sores, nosebleeds, postnasal drip, rhinorrhea, sneezing, tinnitus, trouble swallowing and voice change.   Respiratory: Positive for cough. Negative for chest tightness, shortness of breath and wheezing.   Cardiovascular: Negative.   Musculoskeletal: Negative for neck pain.  Neurological: Positive for dizziness and headaches. Negative for light-headedness and numbness.       Objective:   Physical Exam  Constitutional: She appears well-developed and well-nourished.  HENT:  Head: Normocephalic.  Right Ear: External ear normal.  Left Ear: External ear normal.  Nose: Nose normal.  Mouth/Throat: Oropharynx is clear and moist. No oropharyngeal exudate.  Bilat tender over frontal and maxillary areas  Eyes: EOM are normal. Pupils are equal, round, and reactive to light. Right eye exhibits no discharge. Left eye exhibits no  discharge.  Neck: Normal range of motion. Neck supple. No JVD present. No thyromegaly present.  Cardiovascular: Normal rate, regular rhythm and normal heart sounds.   No murmur heard. Pulmonary/Chest: Effort normal. She has no wheezes. She has rales.  Lymphadenopathy:    She has no cervical adenopathy.  Skin: She is not diaphoretic.          Assessment & Plan:     1. Acute maxillary sinusitis  Plan Tx with Levaquin 500 mg x2 weeks , pulse /taper steroids with decadron and recc mucolytics

## 2014-02-06 ENCOUNTER — Other Ambulatory Visit: Payer: Self-pay | Admitting: Internal Medicine

## 2014-03-01 DIAGNOSIS — R7309 Other abnormal glucose: Secondary | ICD-10-CM | POA: Insufficient documentation

## 2014-03-01 DIAGNOSIS — E785 Hyperlipidemia, unspecified: Secondary | ICD-10-CM | POA: Insufficient documentation

## 2014-03-01 DIAGNOSIS — R000 Tachycardia, unspecified: Secondary | ICD-10-CM | POA: Insufficient documentation

## 2014-03-08 ENCOUNTER — Ambulatory Visit (INDEPENDENT_AMBULATORY_CARE_PROVIDER_SITE_OTHER): Payer: Medicare Other | Admitting: Internal Medicine

## 2014-03-08 ENCOUNTER — Encounter: Payer: Self-pay | Admitting: Internal Medicine

## 2014-03-08 VITALS — BP 106/64 | HR 76 | Temp 97.9°F | Resp 16 | Ht 67.25 in | Wt 163.6 lb

## 2014-03-08 DIAGNOSIS — E559 Vitamin D deficiency, unspecified: Secondary | ICD-10-CM | POA: Insufficient documentation

## 2014-03-08 DIAGNOSIS — I10 Essential (primary) hypertension: Secondary | ICD-10-CM

## 2014-03-08 DIAGNOSIS — R7303 Prediabetes: Secondary | ICD-10-CM

## 2014-03-08 DIAGNOSIS — E785 Hyperlipidemia, unspecified: Secondary | ICD-10-CM

## 2014-03-08 DIAGNOSIS — Z789 Other specified health status: Secondary | ICD-10-CM

## 2014-03-08 DIAGNOSIS — Z1212 Encounter for screening for malignant neoplasm of rectum: Secondary | ICD-10-CM

## 2014-03-08 DIAGNOSIS — Z79899 Other long term (current) drug therapy: Secondary | ICD-10-CM

## 2014-03-08 DIAGNOSIS — Z1331 Encounter for screening for depression: Secondary | ICD-10-CM

## 2014-03-08 LAB — CBC WITH DIFFERENTIAL/PLATELET
BASOS ABS: 0 10*3/uL (ref 0.0–0.1)
Basophils Relative: 0 % (ref 0–1)
Eosinophils Absolute: 0.2 10*3/uL (ref 0.0–0.7)
Eosinophils Relative: 3 % (ref 0–5)
HCT: 39.2 % (ref 36.0–46.0)
Hemoglobin: 13.4 g/dL (ref 12.0–15.0)
LYMPHS PCT: 32 % (ref 12–46)
Lymphs Abs: 1.8 10*3/uL (ref 0.7–4.0)
MCH: 28.8 pg (ref 26.0–34.0)
MCHC: 34.2 g/dL (ref 30.0–36.0)
MCV: 84.1 fL (ref 78.0–100.0)
Monocytes Absolute: 0.4 10*3/uL (ref 0.1–1.0)
Monocytes Relative: 7 % (ref 3–12)
NEUTROS ABS: 3.2 10*3/uL (ref 1.7–7.7)
NEUTROS PCT: 58 % (ref 43–77)
PLATELETS: 236 10*3/uL (ref 150–400)
RBC: 4.66 MIL/uL (ref 3.87–5.11)
RDW: 14.1 % (ref 11.5–15.5)
WBC: 5.5 10*3/uL (ref 4.0–10.5)

## 2014-03-08 MED ORDER — TRIAMCINOLONE ACETONIDE 0.1 % EX CREA: TOPICAL_CREAM | Freq: Four times a day (QID) | CUTANEOUS | Status: DC

## 2014-03-08 NOTE — Progress Notes (Signed)
Patient ID: Donna Dillon, female   DOB: 1946/10/12, 68 y.o.   MRN: [id number]   Annual Comprehensive Examination  This very nice 68 y.o. MWF presents for complete physical.  Patient has been followed for labile HTN, Diabetes  Prediabetes, Hyperlipidemia, and Vitamin D Deficiency.    Labile HTN predates since 2010. Patient's BP has been controlled at home. Today's BP: 106/64 mmHg. Patient denies any cardiac symptoms as chest pain, palpitations, shortness of breath, dizziness or ankle swelling.   Patient's hyperlipidemia is controlled with diet. Patient denies myalgias or other medication SE's. Last cholesterol last visit was 184, triglycerides 177, HDL 55 and LDL 94 in Mar 2014 - at goal.     Patient has prediabetes with A1c 5.5% and elevated insulin 50 in  2009 and A1c 6.0% in 2012  and  last A1c was 5.7%  In Mar 2014. Patient denies reactive hypoglycemic symptoms, visual blurring, diabetic polys, or paresthesias.    Patient has Chronic Anxiety and Depression and is followed by Dr Letta Moynahan.   Finally, patient has history of Vitamin D Deficiency of 36 in 2008 with last vitamin D 61 in Mar 2014.  Medication Sig  . atenolol (TENORMIN) 25 MG tablet Take 1/2 to 1 pill daily for blood pressure  . Calcium 1500 MG tablet Take 1,500 mg by mouth.  . cholecalciferol (VITAMIN D) 1000 UNITS tablet Take 1,000 Units by mouth daily.  . clomiPRAMINE (ANAFRANIL) 50 MG capsule Take 50 mg by mouth at bedtime.  . clonazePAM (KLONOPIN) 1 MG tablet Take 1 mg by mouth at bedtime.  Marland Kitchen ezetimibe (ZETIA) 10 MG tablet Take 10 mg by mouth daily.  . fish oil-omega-3 fatty acids 1000 MG capsule Take 1 g by mouth daily.  . magnesium 30 MG tablet Take 30 mg by mouth 2 (two) times daily.  . Multiple Vitamin (MULTIVITAMIN) tablet Take 1 tablet by mouth daily.  Marland Kitchen omeprazole (PRILOSEC OTC) 20 MG tablet Take 1 tablet (20 mg total) by mouth daily.  . vitamin C (ASCORBIC ACID) 500 MG tablet Take 500 mg by mouth  daily.    Allergies  Allergen Reactions  . Asa [Aspirin] Diarrhea  . Macrolides And Ketolides Nausea And Vomiting  . Paxil [Paroxetine Hcl] Nausea And Vomiting  . Penicillins Hives  . Prednisone     Altered mental state  . Tetracyclines & Related Nausea And Vomiting  . Zoloft [Sertraline Hcl] Nausea And Vomiting  . Sulfa Antibiotics Rash  . Trimethoprim Rash    Past Medical History  Diagnosis Date  . GERD (gastroesophageal reflux disease)   . Esophageal stricture   . Diverticulosis   . Colon polyp     hyperplastic  . Tachycardia   . Anemia     mild  . Hyperlipidemia   . Hypertension   . Prediabetes   . Palpitations   . Anxiety     Past Surgical History  Procedure Laterality Date  . Tonsillectomy    . Breast biopsy Left   . Cesarean section      x1    Family History  Problem Relation Age of Onset  . Kidney cancer Father   . Hypertension Mother   . Heart disease Mother     History  Substance Use Topics  . Smoking status: Never Smoker   . Smokeless tobacco: Never Used  . Alcohol Use: No    ROS Constitutional: Denies fever, chills, weight loss/gain, headaches, insomnia, fatigue, night sweats, and change in appetite. Eyes: Denies redness,  blurred vision, diplopia, discharge, itchy, watery eyes.  ENT: Denies discharge, congestion, post nasal drip, epistaxis, sore throat, earache, hearing loss, dental pain, Tinnitus, Vertigo, Sinus pain, snoring.  Cardio: Denies chest pain, palpitations, irregular heartbeat, syncope, dyspnea, diaphoresis, orthopnea, PND, claudication, edema Respiratory: denies cough, dyspnea, DOE, pleurisy, hoarseness, laryngitis, wheezing.  Gastrointestinal: Denies dysphagia, heartburn, reflux, water brash, pain, cramps, nausea, vomiting, bloating, diarrhea, constipation, hematemesis, melena, hematochezia, jaundice, hemorrhoids Genitourinary: Denies dysuria, frequency, urgency, nocturia, hesitancy, discharge, hematuria, flank  pain Breast:Breast lumps, nipple discharge, bleeding.  Musculoskeletal: Denies arthralgia, myalgia, stiffness, Jt. Swelling, pain, limp, and strain/sprain. Skin: Denies puritis, rash, hives, warts, acne, eczema, changing in skin lesion Neuro: No weakness, tremor, incoordination, spasms, paresthesia, pain Psychiatric: Denies confusion, memory loss, sensory loss Endocrine: Denies change in weight, skin, hair change, nocturia, and paresthesia, diabetic polys, visual blurring, hyper / hypo glycemic episodes.  Heme/Lymph: No excessive bleeding, bruising, enlarged lymph nodes.   Physical Exam  BP 106/64  Pulse 76  Temp 97.9 F   Resp 16  Ht 5' 7.25"   Wt 163 lb 9.6 oz    BMI 25.44 kg/m2  LMP 12/09/1994  General Appearance: Well nourished, in no apparent distress. Eyes: PERRLA, EOMs, conjunctiva no swelling or erythema, normal fundi and vessels. Sinuses: No frontal/maxillary tenderness ENT/Mouth: EACs patent / TMs  nl. Nares clear without erythema, swelling, mucoid exudates. Oral hygiene is good. No erythema, swelling, or exudate. Tongue normal, non-obstructing. Tonsils not swollen or erythematous. Hearing normal.  Neck: Supple, thyroid normal. No bruits, nodes or JVD. Respiratory: Respiratory effort normal.  BS equal and clear bilateral without rales, rhonci, wheezing or stridor. Cardio: Heart sounds are normal with regular rate and rhythm and no murmurs, rubs or gallops. Peripheral pulses are normal and equal bilaterally without edema. No aortic or femoral bruits. Chest: symmetric with normal excursions and percussion. Breasts: Symmetric, without lumps, nipple discharge, retractions, or fibrocystic changes.  Abdomen: Flat, soft, with bowl sounds. Nontender, no guarding, rebound, hernias, masses, or organomegaly.  Lymphatics: Non tender without lymphadenopathy.  Genitourinary:  Musculoskeletal: Full ROM all peripheral extremities, joint stability, 5/5 strength, and normal gait. Skin: Warm  and dry without rashes, lesions, cyanosis, clubbing or  ecchymosis.  Neuro: Cranial nerves intact, reflexes equal bilaterally. Normal muscle tone, no cerebellar symptoms. Sensation intact.  Pysch: Awake and oriented X 3, normal affect, Insight and Judgment appropriate.   Assessment and Plan  1. Annual Comprehensive Examination 2. Hypertension, labile  3. Hyperlipidemia, diet controlled 4. Pre Diabetes, diet controlled 5. Vitamin D Deficiency  Continue prudent diet as discussed, weight control, BP monitoring, regular exercise, and medications. Discussed med's effects and SE's. Screening labs and tests as requested with regular follow-up as recommended.

## 2014-03-08 NOTE — Patient Instructions (Signed)

## 2014-03-09 LAB — HEPATIC FUNCTION PANEL
ALBUMIN: 4.2 g/dL (ref 3.5–5.2)
ALT: 14 U/L (ref 0–35)
AST: 10 U/L (ref 0–37)
Alkaline Phosphatase: 69 U/L (ref 39–117)
BILIRUBIN TOTAL: 0.4 mg/dL (ref 0.2–1.2)
Bilirubin, Direct: 0.1 mg/dL (ref 0.0–0.3)
Indirect Bilirubin: 0.3 mg/dL (ref 0.2–1.2)
Total Protein: 6.3 g/dL (ref 6.0–8.3)

## 2014-03-09 LAB — LIPID PANEL
Cholesterol: 171 mg/dL (ref 0–200)
HDL: 61 mg/dL (ref >39)
LDL Cholesterol: 86 mg/dL (ref 0–99)
TRIGLYCERIDES: 122 mg/dL (ref <150)
Total CHOL/HDL Ratio: 2.8 Ratio
VLDL: 24 mg/dL (ref 0–40)

## 2014-03-09 LAB — BASIC METABOLIC PANEL WITH GFR
BUN: 13 mg/dL (ref 6–23)
CHLORIDE: 105 meq/L (ref 96–112)
CO2: 28 mEq/L (ref 19–32)
CREATININE: 0.77 mg/dL (ref 0.50–1.10)
Calcium: 9.2 mg/dL (ref 8.4–10.5)
GFR, EST NON AFRICAN AMERICAN: 80 mL/min
GFR, Est African American: >89 mL/min
Glucose, Bld: 91 mg/dL (ref 70–99)
Potassium: 4.1 mEq/L (ref 3.5–5.3)
Sodium: 139 mEq/L (ref 135–145)

## 2014-03-09 LAB — TSH: TSH: 1.218 u[IU]/mL (ref 0.350–4.500)

## 2014-03-09 LAB — URINALYSIS, MICROSCOPIC ONLY
BACTERIA UA: NONE SEEN
Casts: NONE SEEN
Crystals: NONE SEEN
Squamous Epithelial / LPF: NONE SEEN

## 2014-03-09 LAB — VITAMIN D 25 HYDROXY (VIT D DEFICIENCY, FRACTURES): VIT D 25 HYDROXY: 59 ng/mL (ref 30–89)

## 2014-03-09 LAB — MAGNESIUM: Magnesium: 2 mg/dL (ref 1.5–2.5)

## 2014-03-09 LAB — HEMOGLOBIN A1C
Hgb A1c MFr Bld: 5.8 % — ABNORMAL HIGH (ref <5.7)
Mean Plasma Glucose: 120 mg/dL — ABNORMAL HIGH (ref <117)

## 2014-03-09 LAB — INSULIN, FASTING: INSULIN FASTING, SERUM: 28 u[IU]/mL (ref 3–28)

## 2014-03-09 LAB — MICROALBUMIN / CREATININE URINE RATIO
CREATININE, URINE: 48.6 mg/dL
MICROALB UR: 0.5 mg/dL (ref 0.00–1.89)
MICROALB/CREAT RATIO: 10.3 mg/g (ref 0.0–30.0)

## 2014-04-25 ENCOUNTER — Other Ambulatory Visit (INDEPENDENT_AMBULATORY_CARE_PROVIDER_SITE_OTHER): Payer: 59

## 2014-04-25 DIAGNOSIS — Z1212 Encounter for screening for malignant neoplasm of rectum: Secondary | ICD-10-CM

## 2014-04-25 LAB — POC HEMOCCULT BLD/STL (HOME/3-CARD/SCREEN)
Card #2 Fecal Occult Blod, POC: NEGATIVE
Card #3 Fecal Occult Blood, POC: NEGATIVE
FECAL OCCULT BLD: NEGATIVE

## 2014-06-09 ENCOUNTER — Ambulatory Visit (INDEPENDENT_AMBULATORY_CARE_PROVIDER_SITE_OTHER): Payer: Medicare Other | Admitting: Physician Assistant

## 2014-06-09 ENCOUNTER — Encounter: Payer: Self-pay | Admitting: Internal Medicine

## 2014-06-09 VITALS — BP 106/62 | HR 72 | Temp 97.9°F | Resp 16 | Ht 66.75 in | Wt 164.2 lb

## 2014-06-09 DIAGNOSIS — Z79899 Other long term (current) drug therapy: Secondary | ICD-10-CM

## 2014-06-09 DIAGNOSIS — I10 Essential (primary) hypertension: Secondary | ICD-10-CM

## 2014-06-09 DIAGNOSIS — R7303 Prediabetes: Secondary | ICD-10-CM

## 2014-06-09 DIAGNOSIS — E785 Hyperlipidemia, unspecified: Secondary | ICD-10-CM

## 2014-06-09 DIAGNOSIS — R7309 Other abnormal glucose: Secondary | ICD-10-CM

## 2014-06-09 DIAGNOSIS — E559 Vitamin D deficiency, unspecified: Secondary | ICD-10-CM

## 2014-06-09 NOTE — Patient Instructions (Signed)
Knee Bracing Knee braces are supports to help stabilize and protect an injured or painful knee. They come in many different styles. They should support and protect the knee without increasing the chance of other injuries to yourself or others. It is important not to have a false sense of security when using a brace. Knee braces that help you to keep using your knee:  Do not restore normal knee stability under high stress forces.  May decrease some aspects of athletic performance. Some of the different types of knee braces are:  Prophylactic knee braces are designed to prevent or reduce the severity of knee injuries during sports that make injury to the knee more likely.  Rehabilitative knee braces are designed to allow protected motion of:  Injured knees.  Knees that have been treated with or without surgery. There is no evidence that the use of a supportive knee brace protects the graft following a successful anterior cruciate ligament (ACL) reconstruction. However, braces are sometimes used to:   Protect injured ligaments.  Control knee movement during the initial healing period. They may be used as part of the treatment program for the various injured ligaments or cartilage of the knee including the:  Anterior cruciate ligament.  Medial collateral ligament.  Medial or lateral cartilage (meniscus).  Posterior cruciate ligament.  Lateral collateral ligament. Rehabilitative knee braces are most commonly used:  During crutch-assisted walking right after injury.  During crutch-assisted walking right after surgery to repair the cartilage and/or cruciate ligament injury.  For a short period of time, 2-8 weeks, after the injury or surgery. The value of a rehabilitative brace as opposed to a cast or splint includes the:  Ability to adjust the brace for swelling.  Ability to remove the brace for examinations, icing or showering.  Ability to allow for movement in a controlled  range of motion. Functional knee braces give support to knees that have already been injured. They are designed to provide stability for the injured knee and provide protection after repair. Functional knee braces may not affect performance much. Lower extremity muscle strengthening, flexibility, and improvement in technique are more important than bracing in treating ligamentous knee injuries. Functional braces are not a substitute for rehabilitation or surgical procedures. Unloader/offloader braces are designed to provide pain relief in arthritic knees. Patients with wear and tear arthritis from growing old or from an old cartilage injury (osteoarthritis) of the knee, and bow legged (varus) or knock knee (valgus) deformities, often develop increased pain in the arthritic side due to increased loading. Unloader/offloader braces are made to reduce uneven loading in such knees. There is reduction in bowing out movement in bow legged knees when the correct unloader brace is used. Patients with advanced osteoarthritis or severe varus or valgus alignment problems would not likely benefit from bracing. Patellofemoral braces help the kneecap to move smoothly and well centered over the end of the femur in the knee.  Most people who wear knee braces feel that they help. However, there is a lack of scientific evidence that knee braces are helpful at the level needed for athletic participation to prevent injury. In spite of this, athletes report an increase in knee stability, pain relief, performance improvement, and confidence during athletics when using a brace.  Different knee problems require different knee braces:  Your caregiver may suggest one kind of knee brace after knee surgery.  A caregiver may choose another kind of knee brace for support instead of surgery for some types of torn ligaments.  You may also need one for pain in the front of your knee that is not getting better with strengthening and  flexibility exercises. Get your caregiver's advice if you want to try a knee brace. The caregiver will advise you on where to get them and provide a prescription when it is needed to fashion and/or fit the brace. Knee braces are the least important part of preventing knee injuries or getting better following injury. Stretching, strengthening and technique improvement are far more important in caring for and preventing knee injuries. When strengthening your knee, increase your activities a little at a time so as not to develop injuries from over use. Work out an exercise plan with your caregiver and/or physical therapist to get the best program for you. Do not let a knee brace become a crutch. Always remember, there are no braces which support the knee as well as your original ligaments and cartilage you were born with. Conditioning, proper warm-up and stretching remain the most important parts of keeping your knees healthy. HOW TO USE A KNEE BRACE  During sports, knee braces should be used as directed by your caregiver.  Make sure that the hinges are where the knee bends.  Straps, tapes, or hook-and-loop tapes should be fastened around your leg as instructed.  You should check the placement of the brace during activities to make sure that it has not moved. Poorly positioned braces can hurt rather than help you.  To work well, a knee brace should be worn during all activities that put you at risk of knee injury.  Warm up properly before beginning athletic activities. HOME CARE INSTRUCTIONS  Knee braces often get damaged during normal use. Replace worn-out braces for maximum benefit.  Clean regularly with soap and water.  Inspect your brace often for wear and tear.  Cover exposed metal to protect others from injury.  Durable materials may cost more, but last longer. SEEK IMMEDIATE MEDICAL CARE IF:   Your knee seems to be getting worse rather than better.  You have increasing pain or  swelling in the knee.  You have problems caused by the knee brace.  You have increased swelling or inflammation (redness or soreness) in your knee.  Your knee becomes warm and more painful and you develop an unexplained temperature over 101 F (38.3 C). MAKE SURE YOU:   Understand these instructions.  Will watch your condition.  Will get help right away if you are not doing well or get worse. See your caregiver, physical therapist or orthopedic surgeon for additional information. Document Released: 02/15/2004 Document Revised: 02/17/2012 Document Reviewed: 05/24/2009 [facility] Patient Information 2015 [place], [place]. This information is not intended to replace advice given to you by your health care provider. Make sure you discuss any questions you have with your health care provider.  Back Exercises Back exercises help treat and prevent back injuries. The goal of back exercises is to increase the strength of your abdominal and back muscles and the flexibility of your back. These exercises should be started when you no longer have back pain. Back exercises include:  Pelvic Tilt. Lie on your back with your knees bent. Tilt your pelvis until the lower part of your back is against the floor. Hold this position 5 to 10 sec and repeat 5 to 10 times.  Knee to Chest. Pull first 1 knee up against your chest and hold for 20 to 30 seconds, repeat this with the other knee, and then both knees. This may be  done with the other leg straight or bent, whichever feels better.  Sit-Ups or Curl-Ups. Bend your knees 90 degrees. Start with tilting your pelvis, and do a partial, slow sit-up, lifting your trunk only 30 to 45 degrees off the floor. Take at least 2 to 3 seconds for each sit-up. Do not do sit-ups with your knees out straight. If partial sit-ups are difficult, simply do the above but with only tightening your abdominal muscles and holding it as directed.  Hip-Lift. Lie on your back with your knees  flexed 90 degrees. Push down with your feet and shoulders as you raise your hips a couple inches off the floor; hold for 10 seconds, repeat 5 to 10 times.  Back arches. Lie on your stomach, propping yourself up on bent elbows. Slowly press on your hands, causing an arch in your low back. Repeat 3 to 5 times. Any initial stiffness and discomfort should lessen with repetition over time.  Shoulder-Lifts. Lie face down with arms beside your body. Keep hips and torso pressed to floor as you slowly lift your head and shoulders off the floor. Do not overdo your exercises, especially in the beginning. Exercises may cause you some mild back discomfort which lasts for a few minutes; however, if the pain is more severe, or lasts for more than 15 minutes, do not continue exercises until you see your caregiver. Improvement with exercise therapy for back problems is slow.  See your caregivers for assistance with developing a proper back exercise program. Document Released: 01/02/2005 Document Revised: 02/17/2012 Document Reviewed: 09/26/2011 [facility] Patient Information 2015 [place], [place]. This information is not intended to replace advice given to you by your health care provider. Make sure you discuss any questions you have with your health care provider.

## 2014-06-09 NOTE — Progress Notes (Signed)
Assessment and Plan:  Hypertension: Continue medication, monitor blood pressure at home. Continue DASH diet. Cholesterol: Continue diet and exercise. Check cholesterol.  Pre-diabetes-Continue diet and exercise. Check A1C Vitamin D Def- check level and continue medications. Left knee pain/giving way with walking- stretch lower back, wear knee brace   Continue diet and meds as discussed. Further disposition pending results of labs.  HPI 68 y.o. female  presents for 3 month follow up with hypertension, hyperlipidemia, prediabetes and vitamin D. Her blood pressure has been controlled at home, today their BP is BP: 106/62 mmHg She does workout. She denies chest pain, shortness of breath, dizziness.  She is on cholesterol medication, zetia, and denies myalgias. Her cholesterol is at goal. The cholesterol last visit was:   Lab Results  Component Value Date   CHOL 171 03/08/2014   HDL 61 03/08/2014   LDLCALC 86 03/08/2014   TRIG 122 03/08/2014   CHOLHDL 2.8 03/08/2014   She has been working on diet and exercise for prediabetes, and denies polydipsia and polyuria. Last A1C in the office was:  Lab Results  Component Value Date   HGBA1C 5.8* 03/08/2014   Patient is on Vitamin D supplement.   Lab Results  Component Value Date   VD25OH 59 03/08/2014       Current Medications:  Current Outpatient Prescriptions on File Prior to Visit  Medication Sig Dispense Refill  . atenolol (TENORMIN) 25 MG tablet Take 1/2 to 1 pill daily for blood pressure  30 tablet  1  . Calcium 1500 MG tablet Take 1,500 mg by mouth.      . cholecalciferol (VITAMIN D) 1000 UNITS tablet Take 1,000 Units by mouth daily.      . clomiPRAMINE (ANAFRANIL) 50 MG capsule Take 50 mg by mouth at bedtime.      . clonazePAM (KLONOPIN) 1 MG tablet Take 1 mg by mouth at bedtime.      Marland Kitchen ezetimibe (ZETIA) 10 MG tablet Take 10 mg by mouth daily.      . fish oil-omega-3 fatty acids 1000 MG capsule Take 1 g by mouth daily.      . magnesium  30 MG tablet Take 30 mg by mouth 2 (two) times daily.      . Multiple Vitamin (MULTIVITAMIN) tablet Take 1 tablet by mouth daily.      Marland Kitchen omeprazole (PRILOSEC OTC) 20 MG tablet Take 1 tablet (20 mg total) by mouth daily.  90 tablet  1  . triamcinolone cream (KENALOG) 0.1 % Apply topically 4 (four) times daily.  85.2 g  99  . vitamin C (ASCORBIC ACID) 500 MG tablet Take 500 mg by mouth daily.       No current facility-administered medications on file prior to visit.   Medical History:  Past Medical History  Diagnosis Date  . GERD (gastroesophageal reflux disease)   . Esophageal stricture   . Diverticulosis   . Colon polyp     hyperplastic  . Tachycardia   . Anemia     mild  . Hyperlipidemia   . Hypertension   . Prediabetes   . Palpitations   . Anxiety    Allergies:  Allergies  Allergen Reactions  . Asa [Aspirin] Diarrhea  . Macrolides And Ketolides Nausea And Vomiting  . Paxil [Paroxetine Hcl] Nausea And Vomiting  . Penicillins Hives  . Prednisone     Altered mental state  . Tetracyclines & Related Nausea And Vomiting  . Zoloft [Sertraline Hcl] Nausea And Vomiting  .  Sulfa Antibiotics Rash  . Trimethoprim Rash     Review of Systems: [X]  = complains of  [ ]  = denies  General: Fatigue [ ]  Fever [ ]  Chills [ ]  Weakness [ ]   Insomnia [ ]  Eyes: Redness [ ]  Blurred vision [ ]  Diplopia [ ]   ENT: Congestion [ ]  Sinus Pain [ ]  Post Nasal Drip [ ]  Sore Throat [ ]  Earache [ ]   Cardiac: Chest pain/pressure [ ]  SOB [ ]  Orthopnea [ ]   Palpitations [ ]   Paroxysmal nocturnal dyspnea[ ]  Claudication [ ]  Edema [ ]   Pulmonary: Cough [ ]  Wheezing[ ]   SOB [ ]   Snoring [ ]   GI: Nausea [ ]  Vomiting[ ]  Dysphagia[ ]  Heartburn[ ]  Abdominal pain [ ]  Constipation [ ] ; Diarrhea [ ] ; BRBPR [ ]  Melena[ ]  GU: Hematuria[ ]  Dysuria [ ]  Nocturia[ ]  Urgency [ ]   Hesitancy [ ]  Discharge [ ]  Neuro: Headaches[ ]  Vertigo[ ]  Paresthesias[ ]  Spasm [ ]  Speech changes [ ]  Incoordination [ ]   Ortho: Arthritis [ ]   Joint pain Valu.Nieves ] Muscle pain [ ]  Joint swelling [ ]  Back Pain [ ]  Skin:  Rash [ ]   Pruritis [ ]  Change in skin lesion [ ]   Psych: Depression[ ]  Anxiety[ ]  Confusion [ ]  Memory loss [ ]   Heme/Lypmh: Bleeding [ ]  Bruising [ ]  Enlarged lymph nodes [ ]   Endocrine: Visual blurring [ ]  Paresthesia [ ]  Polyuria [ ]  Polydypsea [ ]    Heat/cold intolerance [ ]  Hypoglycemia [ ]   Family history- Review and unchanged Social history- Review and unchanged Physical Exam: BP 106/62  Pulse 72  Temp(Src) 97.9 F (36.6 C) (Temporal)  Resp 16  Ht 5' 6.75" (1.695 m)  Wt 164 lb 3.2 oz (74.481 kg)  BMI 25.92 kg/m2  LMP 12/09/1994 Wt Readings from Last 3 Encounters:  06/09/14 164 lb 3.2 oz (74.481 kg)  03/08/14 163 lb 9.6 oz (74.208 kg)  12/07/13 162 lb (73.483 kg)   General Appearance: Well nourished, in no apparent distress. Eyes: PERRLA, EOMs, conjunctiva no swelling or erythema Sinuses: No Frontal/maxillary tenderness ENT/Mouth: Ext aud canals clear, TMs without erythema, bulging. No erythema, swelling, or exudate on post pharynx.  Tonsils not swollen or erythematous. Hearing normal.  Neck: Supple, thyroid normal.  Respiratory: Respiratory effort normal, BS equal bilaterally without rales, rhonchi, wheezing or stridor.  Cardio: RRR with no MRGs. Brisk peripheral pulses without edema.  Abdomen: Soft, + BS.  Non tender, no guarding, rebound, hernias, masses. Lymphatics: Non tender without lymphadenopathy.  Musculoskeletal: Full ROM, 5/5 strength, normal gait. Left knee no laxity, + crepitus and mild medial joint line tenderness Skin: Warm, dry without rashes, lesions, ecchymosis.  Neuro: Cranial nerves intact. Normal muscle tone, no cerebellar symptoms. Sensation intact.  Psych: Awake and oriented X 3, normal affect, Insight and Judgment appropriate.    Vicie Mutters 5:10 PM

## 2014-06-10 LAB — BASIC METABOLIC PANEL WITH GFR
BUN: 19 mg/dL (ref 6–23)
CHLORIDE: 105 meq/L (ref 96–112)
CO2: 26 mEq/L (ref 19–32)
CREATININE: 0.89 mg/dL (ref 0.50–1.10)
Calcium: 9.2 mg/dL (ref 8.4–10.5)
GFR, EST AFRICAN AMERICAN: 78 mL/min
GFR, Est Non African American: 67 mL/min
Glucose, Bld: 82 mg/dL (ref 70–99)
Potassium: 4.2 mEq/L (ref 3.5–5.3)
Sodium: 139 mEq/L (ref 135–145)

## 2014-06-10 LAB — LIPID PANEL
Cholesterol: 190 mg/dL (ref 0–200)
HDL: 54 mg/dL (ref >39)
LDL Cholesterol: 113 mg/dL — ABNORMAL HIGH (ref 0–99)
Total CHOL/HDL Ratio: 3.5 Ratio
Triglycerides: 114 mg/dL (ref <150)
VLDL: 23 mg/dL (ref 0–40)

## 2014-06-10 LAB — HEPATIC FUNCTION PANEL
ALBUMIN: 4.1 g/dL (ref 3.5–5.2)
ALK PHOS: 68 U/L (ref 39–117)
ALT: 13 U/L (ref 0–35)
AST: 9 U/L (ref 0–37)
Bilirubin, Direct: 0.1 mg/dL (ref 0.0–0.3)
Indirect Bilirubin: 0.4 mg/dL (ref 0.2–1.2)
TOTAL PROTEIN: 6.3 g/dL (ref 6.0–8.3)
Total Bilirubin: 0.5 mg/dL (ref 0.2–1.2)

## 2014-06-10 LAB — CBC WITH DIFFERENTIAL/PLATELET
BASOS ABS: 0.1 10*3/uL (ref 0.0–0.1)
Basophils Relative: 1 % (ref 0–1)
Eosinophils Absolute: 0.3 10*3/uL (ref 0.0–0.7)
Eosinophils Relative: 6 % — ABNORMAL HIGH (ref 0–5)
HEMATOCRIT: 39.4 % (ref 36.0–46.0)
Hemoglobin: 13.3 g/dL (ref 12.0–15.0)
Lymphocytes Relative: 34 % (ref 12–46)
Lymphs Abs: 1.9 10*3/uL (ref 0.7–4.0)
MCH: 28.9 pg (ref 26.0–34.0)
MCHC: 33.8 g/dL (ref 30.0–36.0)
MCV: 85.5 fL (ref 78.0–100.0)
Monocytes Absolute: 0.6 10*3/uL (ref 0.1–1.0)
Monocytes Relative: 10 % (ref 3–12)
Neutro Abs: 2.7 10*3/uL (ref 1.7–7.7)
Neutrophils Relative %: 49 % (ref 43–77)
Platelets: 216 10*3/uL (ref 150–400)
RBC: 4.61 MIL/uL (ref 3.87–5.11)
RDW: 14 % (ref 11.5–15.5)
WBC: 5.6 10*3/uL (ref 4.0–10.5)

## 2014-06-10 LAB — INSULIN, FASTING: INSULIN FASTING, SERUM: 25 u[IU]/mL (ref 3–28)

## 2014-06-10 LAB — VITAMIN D 25 HYDROXY (VIT D DEFICIENCY, FRACTURES): VIT D 25 HYDROXY: 63 ng/mL (ref 30–89)

## 2014-06-10 LAB — HEMOGLOBIN A1C
Hgb A1c MFr Bld: 5.9 % — ABNORMAL HIGH (ref <5.7)
MEAN PLASMA GLUCOSE: 123 mg/dL — AB (ref <117)

## 2014-06-10 LAB — TSH: TSH: 1.322 u[IU]/mL (ref 0.350–4.500)

## 2014-06-10 LAB — MAGNESIUM: Magnesium: 2 mg/dL (ref 1.5–2.5)

## 2014-06-22 ENCOUNTER — Encounter: Payer: Self-pay | Admitting: Internal Medicine

## 2014-09-15 ENCOUNTER — Ambulatory Visit: Payer: Self-pay | Admitting: Internal Medicine

## 2014-09-23 ENCOUNTER — Other Ambulatory Visit: Payer: Self-pay

## 2014-10-10 ENCOUNTER — Encounter: Payer: Self-pay | Admitting: Internal Medicine

## 2014-10-11 ENCOUNTER — Ambulatory Visit: Payer: Self-pay | Admitting: Internal Medicine

## 2014-10-13 ENCOUNTER — Ambulatory Visit (INDEPENDENT_AMBULATORY_CARE_PROVIDER_SITE_OTHER): Payer: Medicare Other | Admitting: Physician Assistant

## 2014-10-13 ENCOUNTER — Encounter: Payer: Self-pay | Admitting: Physician Assistant

## 2014-10-13 VITALS — BP 114/70 | HR 80 | Temp 97.8°F | Resp 18 | Ht 67.0 in | Wt 163.0 lb

## 2014-10-13 DIAGNOSIS — J0100 Acute maxillary sinusitis, unspecified: Secondary | ICD-10-CM

## 2014-10-13 MED ORDER — AZITHROMYCIN 250 MG PO TABS: ORAL_TABLET | ORAL | Status: DC

## 2014-10-13 NOTE — Progress Notes (Signed)
Subjective:    Patient ID: Donna Dillon, female    DOB: May 05, 1946, 68 y.o.   MRN: [id number]  Sinusitis This is a new problem. Episode onset: 1 and 1/2 weeks ago when she was on a cruise.  She states that when she gets exposed to hot and cold temperatures back and forth, she gets sinus infections. The problem is unchanged. There has been no fever. She is experiencing no pain. Associated symptoms include congestion, headaches and sinus pressure. Pertinent negatives include no chills, coughing, diaphoresis, ear pain, hoarse voice, neck pain, shortness of breath, sneezing, sore throat or swollen glands. (No cough but states she did have green mucous and it had a "sick taste".) Treatments tried: Patient came walking in with old prescription bottle for Levaquin  that was not finished (tablets in bottle). The treatment provided no relief.   Patient states that Levaquin causes her joints to hurt.    Review of Systems  Constitutional: Positive for fatigue. Negative for fever, chills, diaphoresis and appetite change.  HENT: Positive for congestion and sinus pressure. Negative for dental problem, ear discharge, ear pain, facial swelling, hoarse voice, postnasal drip, rhinorrhea, sneezing, sore throat, tinnitus, trouble swallowing and voice change.   Eyes: Negative.   Respiratory: Negative for cough, chest tightness, shortness of breath, wheezing and stridor.   Cardiovascular: Negative.  Negative for chest pain.  Gastrointestinal: Negative.  Negative for nausea, vomiting, abdominal pain, diarrhea and constipation.  Genitourinary: Negative.   Musculoskeletal: Negative.  Negative for neck pain.  Skin: Negative.  Negative for rash.  Neurological: Positive for headaches.  Hematological: Negative.   Psychiatric/Behavioral: Negative.    Past Medical History  Diagnosis Date  . GERD (gastroesophageal reflux disease)   . Esophageal stricture   . Diverticulosis   . Colon polyp     hyperplastic  .  Tachycardia   . Anemia     mild  . Hyperlipidemia   . Hypertension   . Prediabetes   . Palpitations   . Anxiety    Current Outpatient Prescriptions on File Prior to Visit  Medication Sig Dispense Refill  . atenolol (TENORMIN) 25 MG tablet Take 1/2 to 1 pill daily for blood pressure 30 tablet 1  . Calcium 1500 MG tablet Take 1,500 mg by mouth.    . cholecalciferol (VITAMIN D) 1000 UNITS tablet Take 1,000 Units by mouth daily.    . clomiPRAMINE (ANAFRANIL) 50 MG capsule Take 50 mg by mouth at bedtime.    . clonazePAM (KLONOPIN) 1 MG tablet Take 1 mg by mouth at bedtime.    Marland Kitchen ezetimibe (ZETIA) 10 MG tablet Take 10 mg by mouth daily.    . fish oil-omega-3 fatty acids 1000 MG capsule Take 1 g by mouth daily.    . magnesium 30 MG tablet Take 30 mg by mouth 2 (two) times daily.    . Multiple Vitamin (MULTIVITAMIN) tablet Take 1 tablet by mouth daily.    Marland Kitchen triamcinolone cream (KENALOG) 0.1 % Apply topically 4 (four) times daily. 85.2 g 99  . vitamin C (ASCORBIC ACID) 500 MG tablet Take 500 mg by mouth daily.     No current facility-administered medications on file prior to visit.   Allergies  Allergen Reactions  . Asa [Aspirin] Diarrhea  . Levaquin [Levofloxacin] Other (See Comments)    Joint Pain  . Macrolides And Ketolides Nausea And Vomiting  . Paxil [Paroxetine Hcl] Nausea And Vomiting  . Penicillins Hives  . Prednisone     Altered mental state  .  Tetracyclines & Related Nausea And Vomiting  . Zoloft [Sertraline Hcl] Nausea And Vomiting  . Sulfa Antibiotics Rash  . Trimethoprim Rash     BP 114/70 mmHg  Pulse 80  Temp(Src) 97.8 F (36.6 C) (Temporal)  Resp 18  Ht 5\' 7"  (1.702 m)  Wt 163 lb (73.936 kg)  BMI 25.52 kg/m2  SpO2 96%  LMP 12/09/1994 Objective:   Physical Exam  Constitutional: She is oriented to person, place, and time. She appears well-developed and well-nourished. She has a sickly appearance. No distress.  HENT:  Head: Normocephalic and atraumatic.  Right  Ear: Tympanic membrane, external ear and ear canal normal. No drainage, swelling or tenderness. Tympanic membrane is not injected, not scarred, not perforated, not erythematous, not retracted and not bulging. No middle ear effusion.  Left Ear: External ear normal. No drainage, swelling or tenderness. Tympanic membrane is not injected, not scarred, not perforated, not erythematous, not retracted and not bulging.  No middle ear effusion.  Nose: No mucosal edema, rhinorrhea or sinus tenderness. Right sinus exhibits maxillary sinus tenderness. Right sinus exhibits no frontal sinus tenderness. Left sinus exhibits maxillary sinus tenderness. Left sinus exhibits no frontal sinus tenderness.  Mouth/Throat: Uvula is midline and mucous membranes are normal. Mucous membranes are not pale and not dry. No uvula swelling. Posterior oropharyngeal erythema present. No oropharyngeal exudate, posterior oropharyngeal edema or tonsillar abscesses.  Turbinates were mildly erythematous bilaterally.  Mild posterior oropharyngeal erythema.  Eyes: Conjunctivae and lids are normal. Pupils are equal, round, and reactive to light. Right eye exhibits no discharge. Left eye exhibits no discharge. No scleral icterus.  Neck: Trachea normal, normal range of motion and phonation normal. Neck supple. No tracheal tenderness present. No tracheal deviation present.  Cardiovascular: Normal rate, regular rhythm, S1 normal, S2 normal, normal heart sounds, intact distal pulses and normal pulses.  Exam reveals no gallop, no distant heart sounds and no friction rub.   No murmur heard. Pulmonary/Chest: Effort normal and breath sounds normal. No accessory muscle usage or stridor. No tachypnea and no bradypnea. No respiratory distress. She has no decreased breath sounds. She has no wheezes. She has no rhonchi. She has no rales. She exhibits no tenderness.  Abdominal: Soft. She exhibits no distension and no mass. There is no tenderness. There is no  rebound and no guarding.  Musculoskeletal: Normal range of motion.  Lymphadenopathy:       Head (right side): No submental, no submandibular, no tonsillar, no preauricular, no posterior auricular and no occipital adenopathy present.       Head (left side): No submental, no submandibular, no tonsillar, no preauricular, no posterior auricular and no occipital adenopathy present.    She has no cervical adenopathy.       Right: No supraclavicular adenopathy present.       Left: No supraclavicular adenopathy present.  Neurological: She is alert and oriented to person, place, and time. She has normal strength.  Skin: Skin is warm, dry and intact. No rash noted. She is not diaphoretic. No cyanosis. Nails show no clubbing.  Psychiatric: She has a normal mood and affect. Her speech is normal and behavior is normal. Judgment and thought content normal. Cognition and memory are normal.  Vitals reviewed.     Assessment & Plan:  1. Acute maxillary sinusitis, recurrence not specified -Take the Z-Pak as prescribed- times 2.  Patient states she tolerated the Z-Pak before and does not have any side effects.  - azithromycin (ZITHROMAX Z-PAK) 250 MG tablet; Take  2 tablets PO on day 1, then take 1 tablet PO QDaily for 4 days.  Dispense: 6 tablet; Refill: 1 -Continue Claritin for allergies. -While drinking fluids, pinch and hold nose close and swallow.  This will help open up your eustachian tubes to drain the fluid behind your ear drums. -Try steam showers to open your nasal passages.  Drink lots of water to stay hydrated and to thin mucous. -Saline nasal spray- Take 2 sprays in each nostril at bedtime.  Make sure you spray towards the outside of each nostril towards the outer corner of your eye, hold nose close and tilt head back.   -Discussed medication effects and SE's.  Pt agreed to treatment plan. -It can take up to 2 weeks to feel better.  Sinusitis is mostly caused by viruses. If you are not better in  10-14 days after 2 Z-Paks, then it was most likely a virus. Please call the office if you have any questions.  Meleane Selinger, Stephani Police, PA-C 5:20 PM [facility]

## 2014-10-13 NOTE — Patient Instructions (Addendum)
- Continue Claritin for allergies -While drinking fluids, pinch and hold nose close and swallow.  This will help open up your eustachian tubes to drain the fluid behind your ear drums. -Try steam showers to open your nasal passages.  Drink lots of water to stay hydrated and to thin mucous. -Saline nasal spray- Take 2 sprays in each nostril at bedtime.  Make sure you spray towards the outside of each nostril towards the outer corner of your eye, hold nose close and tilt head back.   -It can take up to 2 weeks to feel better.  Sinusitis is mostly caused by viruses.  Take Z-Pak as prescribed.  If you are not better in 10-14 days after 2 Z-Paks, then it was most likely a virus.  Please call the office if you have any questions.  Sinusitis Sinusitis is redness, soreness, and inflammation of the paranasal sinuses. Paranasal sinuses are air pockets within the bones of your face (beneath the eyes, the middle of the forehead, or above the eyes). In healthy paranasal sinuses, mucus is able to drain out, and air is able to circulate through them by way of your nose. However, when your paranasal sinuses are inflamed, mucus and air can become trapped. This can allow bacteria and other germs to grow and cause infection. Sinusitis can develop quickly and last only a short time (acute) or continue over a long period (chronic). Sinusitis that lasts for more than 12 weeks is considered chronic.  CAUSES  Causes of sinusitis include:  Allergies.  Structural abnormalities, such as displacement of the cartilage that separates your nostrils (deviated septum), which can decrease the air flow through your nose and sinuses and affect sinus drainage.  Functional abnormalities, such as when the small hairs (cilia) that line your sinuses and help remove mucus do not work properly or are not present. SIGNS AND SYMPTOMS  Symptoms of acute and chronic sinusitis are the same. The primary symptoms are pain and pressure around  the affected sinuses. Other symptoms include:  Upper toothache.  Earache.  Headache.  Bad breath.  Decreased sense of smell and taste.  A cough, which worsens when you are lying flat.  Fatigue.  Fever.  Thick drainage from your nose, which often is green and may contain pus (purulent).  Swelling and warmth over the affected sinuses. DIAGNOSIS  Your health care provider will perform a physical exam. During the exam, your health care provider may:  Look in your nose for signs of abnormal growths in your nostrils (nasal polyps).  Tap over the affected sinus to check for signs of infection.  View the inside of your sinuses (endoscopy) using an imaging device that has a light attached (endoscope). If your health care provider suspects that you have chronic sinusitis, one or more of the following tests may be recommended:  Allergy tests.  Nasal culture. A sample of mucus is taken from your nose, sent to a lab, and screened for bacteria.  Nasal cytology. A sample of mucus is taken from your nose and examined by your health care provider to determine if your sinusitis is related to an allergy. TREATMENT  Most cases of acute sinusitis are related to a viral infection and will resolve on their own within 10 days. Sometimes medicines are prescribed to help relieve symptoms (pain medicine, decongestants, nasal steroid sprays, or saline sprays).  However, for sinusitis related to a bacterial infection, your health care provider will prescribe antibiotic medicines. These are medicines that will help kill  the bacteria causing the infection.  Rarely, sinusitis is caused by a fungal infection. In theses cases, your health care provider will prescribe antifungal medicine. For some cases of chronic sinusitis, surgery is needed. Generally, these are cases in which sinusitis recurs more than 3 times per year, despite other treatments. HOME CARE INSTRUCTIONS   Drink plenty of water. Water helps  thin the mucus so your sinuses can drain more easily.  Use a humidifier.  Inhale steam 3 to 4 times a day (for example, sit in the bathroom with the shower running).  Apply a warm, moist washcloth to your face 3 to 4 times a day, or as directed by your health care provider.  Use saline nasal sprays to help moisten and clean your sinuses.  Take medicines only as directed by your health care provider.  If you were prescribed either an antibiotic or antifungal medicine, finish it all even if you start to feel better. SEEK IMMEDIATE MEDICAL CARE IF:  You have increasing pain or severe headaches.  You have nausea, vomiting, or drowsiness.  You have swelling around your face.  You have vision problems.  You have a stiff neck.  You have difficulty breathing. MAKE SURE YOU:   Understand these instructions.  Will watch your condition.  Will get help right away if you are not doing well or get worse. Document Released: 11/25/2005 Document Revised: 04/11/2014 Document Reviewed: 12/10/2011 [facility] Patient Information 2015 [place], [place]. This information is not intended to replace advice given to you by your health care provider. Make sure you discuss any questions you have with your health care provider.

## 2014-11-10 ENCOUNTER — Ambulatory Visit (INDEPENDENT_AMBULATORY_CARE_PROVIDER_SITE_OTHER): Payer: Medicare Other | Admitting: Internal Medicine

## 2014-11-10 ENCOUNTER — Encounter: Payer: Self-pay | Admitting: Internal Medicine

## 2014-11-10 VITALS — BP 100/62 | HR 68 | Temp 98.2°F | Resp 16 | Ht 67.0 in | Wt 164.2 lb

## 2014-11-10 DIAGNOSIS — E785 Hyperlipidemia, unspecified: Secondary | ICD-10-CM

## 2014-11-10 DIAGNOSIS — R7309 Other abnormal glucose: Secondary | ICD-10-CM

## 2014-11-10 DIAGNOSIS — I10 Essential (primary) hypertension: Secondary | ICD-10-CM

## 2014-11-10 DIAGNOSIS — R7303 Prediabetes: Secondary | ICD-10-CM

## 2014-11-10 DIAGNOSIS — Z79899 Other long term (current) drug therapy: Secondary | ICD-10-CM

## 2014-11-10 DIAGNOSIS — E559 Vitamin D deficiency, unspecified: Secondary | ICD-10-CM

## 2014-11-10 DIAGNOSIS — Z23 Encounter for immunization: Secondary | ICD-10-CM

## 2014-11-10 NOTE — Patient Instructions (Signed)

## 2014-11-11 LAB — BASIC METABOLIC PANEL WITH GFR
BUN: 17 mg/dL (ref 6–23)
CO2: 26 mEq/L (ref 19–32)
Calcium: 9.2 mg/dL (ref 8.4–10.5)
Chloride: 106 mEq/L (ref 96–112)
Creat: 0.69 mg/dL (ref 0.50–1.10)
GLUCOSE: 122 mg/dL — AB (ref 70–99)
Potassium: 4.4 mEq/L (ref 3.5–5.3)
SODIUM: 139 meq/L (ref 135–145)

## 2014-11-11 LAB — LIPID PANEL
Cholesterol: 167 mg/dL (ref 0–200)
HDL: 60 mg/dL (ref >39)
LDL Cholesterol: 82 mg/dL (ref 0–99)
TRIGLYCERIDES: 126 mg/dL (ref <150)
Total CHOL/HDL Ratio: 2.8 Ratio
VLDL: 25 mg/dL (ref 0–40)

## 2014-11-11 LAB — CBC WITH DIFFERENTIAL/PLATELET
BASOS ABS: 0.1 10*3/uL (ref 0.0–0.1)
BASOS PCT: 1 % (ref 0–1)
EOS ABS: 0.2 10*3/uL (ref 0.0–0.7)
Eosinophils Relative: 4 % (ref 0–5)
HEMATOCRIT: 40.6 % (ref 36.0–46.0)
Hemoglobin: 13.4 g/dL (ref 12.0–15.0)
Lymphocytes Relative: 37 % (ref 12–46)
Lymphs Abs: 2.1 10*3/uL (ref 0.7–4.0)
MCH: 29.2 pg (ref 26.0–34.0)
MCHC: 33 g/dL (ref 30.0–36.0)
MCV: 88.5 fL (ref 78.0–100.0)
MONOS PCT: 6 % (ref 3–12)
MPV: 9.4 fL (ref 9.4–12.4)
Monocytes Absolute: 0.3 10*3/uL (ref 0.1–1.0)
NEUTROS PCT: 52 % (ref 43–77)
Neutro Abs: 3 10*3/uL (ref 1.7–7.7)
PLATELETS: 210 10*3/uL (ref 150–400)
RBC: 4.59 MIL/uL (ref 3.87–5.11)
RDW: 13.7 % (ref 11.5–15.5)
WBC: 5.7 10*3/uL (ref 4.0–10.5)

## 2014-11-11 LAB — VITAMIN D 25 HYDROXY (VIT D DEFICIENCY, FRACTURES): VIT D 25 HYDROXY: 44 ng/mL (ref 30–100)

## 2014-11-11 LAB — HEPATIC FUNCTION PANEL
ALT: 13 U/L (ref 0–35)
AST: 9 U/L (ref 0–37)
Albumin: 3.8 g/dL (ref 3.5–5.2)
Alkaline Phosphatase: 65 U/L (ref 39–117)
BILIRUBIN DIRECT: 0.1 mg/dL (ref 0.0–0.3)
Indirect Bilirubin: 0.3 mg/dL (ref 0.2–1.2)
Total Bilirubin: 0.4 mg/dL (ref 0.2–1.2)
Total Protein: 6.4 g/dL (ref 6.0–8.3)

## 2014-11-11 LAB — INSULIN, FASTING: INSULIN FASTING, SERUM: 24 u[IU]/mL — AB (ref 2.0–19.6)

## 2014-11-11 LAB — HEMOGLOBIN A1C
HEMOGLOBIN A1C: 5.7 % — AB (ref <5.7)
MEAN PLASMA GLUCOSE: 117 mg/dL — AB (ref <117)

## 2014-11-11 LAB — TSH: TSH: 1.872 u[IU]/mL (ref 0.350–4.500)

## 2014-11-11 LAB — MAGNESIUM: MAGNESIUM: 2 mg/dL (ref 1.5–2.5)

## 2014-11-14 DIAGNOSIS — Z23 Encounter for immunization: Secondary | ICD-10-CM

## 2014-11-14 NOTE — Addendum Note (Signed)
Addended by: Melbourne Abts C on: 11/14/2014 08:20 AM   Modules accepted: Orders

## 2014-11-14 NOTE — Progress Notes (Signed)
Patient ID: Donna Dillon, female   DOB: 01-02-1946, 68 y.o.   MRN: [id number]   This very nice 68 y.o.MWF presents for 3 month follow up with Hypertension, Hyperlipidemia, Pre-Diabetes and Vitamin D Deficiency.    Patient is treated for HTN & BP has been controlled at home. Today's BP: 100/62 mmHg. Patient has had no complaints of any cardiac type chest pain, palpitations, dyspnea/orthopnea/PND, dizziness, claudication, or dependent edema.   Hyperlipidemia is controlled with diet & meds. Patient denies myalgias or other med SE's. Last Lipids were near goal  Total Chol 190; HDL 54; LDL 113; Triglycerides 114 in July 2015.    Also, the patient has history of  PreDiabetes and has had no symptoms of reactive hypoglycemia, diabetic polys, paresthesias or visual blurring.  Last A1c was 5.9% in July 2015.    Further, the patient also has history of Vitamin D Deficiency and supplements vitamin D without any suspected side-effects. Last vitamin D was 63 in July 2015.   Medication List   atenolol 25 MG tablet  Commonly known as:  TENORMIN  Take 1/2 to 1 pill daily for blood pressure     Calcium 1500 MG tablet  Take 1,500 mg by mouth.     cholecalciferol 1000 UNITS tablet  Commonly known as:  VITAMIN D  Take 1,000 Units by mouth daily.     clonazePAM 1 MG tablet  Commonly known as:  KLONOPIN  Take 1 mg by mouth at bedtime.     ezetimibe 10 MG tablet  Commonly known as:  ZETIA  Take 10 mg by mouth daily.     fish oil-omega-3 fatty acids 1000 MG capsule  Take 1 g by mouth daily.     magnesium 30 MG tablet  Take 30 mg by mouth 2 (two) times daily.     multivitamin tablet  Take 1 tablet by mouth daily.     triamcinolone cream 0.1 %  Commonly known as:  KENALOG  Apply topically 4 (four) times daily.     vitamin C 500 MG tablet  Commonly known as:  ASCORBIC ACID  Take 500 mg by mouth daily.     Allergies  Allergen Reactions  . Asa [Aspirin] Diarrhea  . Levaquin [Levofloxacin]  Other (See Comments)    Joint Pain  . Macrolides And Ketolides Nausea And Vomiting  . Paxil [Paroxetine Hcl] Nausea And Vomiting  . Penicillins Hives  . Prednisone     Altered mental state  . Tetracyclines & Related Nausea And Vomiting  . Zoloft [Sertraline Hcl] Nausea And Vomiting  . Sulfa Antibiotics Rash  . Trimethoprim Rash    PMHx:   Past Medical History  Diagnosis Date  . GERD (gastroesophageal reflux disease)   . Esophageal stricture   . Diverticulosis   . Colon polyp     hyperplastic  . Tachycardia   . Anemia     mild  . Hyperlipidemia   . Hypertension   . Prediabetes   . Palpitations   . Anxiety    Immunization History  Administered Date(s) Administered  . Pneumococcal Polysaccharide-23 02/14/2009  . Td 12/11/2006  . Tdap 12/09/2005  . Zoster 02/27/2011   Past Surgical History  Procedure Laterality Date  . Tonsillectomy    . Breast biopsy Left   . Cesarean section      x1   FHx:    Reviewed / unchanged  SHx:    Reviewed / unchanged  Systems Review:  Constitutional: Denies fever, chills, wt changes,  headaches, insomnia, fatigue, night sweats, change in appetite. Eyes: Denies redness, blurred vision, diplopia, discharge, itchy, watery eyes.  ENT: Denies discharge, congestion, post nasal drip, epistaxis, sore throat, earache, hearing loss, dental pain, tinnitus, vertigo, sinus pain, snoring.  CV: Denies chest pain, palpitations, irregular heartbeat, syncope, dyspnea, diaphoresis, orthopnea, PND, claudication or edema. Respiratory: denies cough, dyspnea, DOE, pleurisy, hoarseness, laryngitis, wheezing.  Gastrointestinal: Denies dysphagia, odynophagia, heartburn, reflux, water brash, abdominal pain or cramps, nausea, vomiting, bloating, diarrhea, constipation, hematemesis, melena, hematochezia  or hemorrhoids. Genitourinary: Denies dysuria, frequency, urgency, nocturia, hesitancy, discharge, hematuria or flank pain. Musculoskeletal: Denies arthralgias,  myalgias, stiffness, jt. swelling, pain, limping or strain/sprain.  Skin: Denies pruritus, rash, hives, warts, acne, eczema or change in skin lesion(s). Neuro: No weakness, tremor, incoordination, spasms, paresthesia or pain. Psychiatric: Denies confusion, memory loss or sensory loss. Endo: Denies change in weight, skin or hair change.  Heme/Lymph: No excessive bleeding, bruising or enlarged lymph nodes.  Physical Exam  BP 100/62   Pulse 68  Temp 98.2 F   Resp 16  Ht 5\' 7"    Wt 164 lb 3.2 oz  BMI 25.71   LMP 1996  Appears over nourished with central obesity and in no distress.  Eyes: PERRLA, EOMs, conjunctiva no swelling or erythema. Sinuses: No frontal/maxillary tenderness ENT/Mouth: EAC's clear, TM's nl w/o erythema, bulging. Nares clear w/o erythema, swelling, exudates. Oropharynx clear without erythema or exudates. Oral hygiene is good. Tongue normal, non obstructing. Hearing intact.  Neck: Supple. Thyroid nl. Car 2+/2+ without bruits, nodes or JVD. Chest: Respirations nl with BS clear & equal w/o rales, rhonchi, wheezing or stridor.  Cor: Heart sounds normal w/ regular rate and rhythm without sig. murmurs, gallops, clicks, or rubs. Peripheral pulses normal and equal  without edema.  Abdomen: Soft, Obese w/ bowel sounds normal. Non-tender w/o guarding, rebound, hernias, masses, or organomegaly.  Lymphatics: Unremarkable.  Musculoskeletal: Full ROM all peripheral extremities, joint stability, 5/5 strength, and normal gait.  Skin: Warm, dry without exposed rashes, lesions or ecchymosis apparent.  Neuro: Cranial nerves intact, reflexes equal bilaterally. Sensory-motor testing grossly intact. Tendon reflexes grossly intact.  Pysch: Alert & oriented x 3.  Insight and judgement nl & appropriate. No ideations.  Assessment and Plan:  1. Hypertension - Continue monitor blood pressure at home. Continue diet/meds same.  2. Hyperlipidemia - Continue diet/meds, exercise,& lifestyle  modifications. Continue monitor periodic cholesterol/liver & renal functions   3. Pre-Diabetes - Continue diet, exercise, lifestyle modifications. Monitor appropriate labs.  4. Vitamin D Deficiency - Continue supplementation.   Recommended regular exercise, BP monitoring, weight control, and discussed med and SE's. Recommended labs to assess and monitor clinical status. Further disposition pending results of labs.

## 2014-12-07 ENCOUNTER — Encounter: Payer: Self-pay | Admitting: Physician Assistant

## 2014-12-07 ENCOUNTER — Ambulatory Visit (INDEPENDENT_AMBULATORY_CARE_PROVIDER_SITE_OTHER): Payer: Medicare Other | Admitting: Physician Assistant

## 2014-12-07 VITALS — BP 118/64 | HR 78 | Temp 98.0°F | Resp 16 | Ht 67.0 in | Wt 163.0 lb

## 2014-12-07 DIAGNOSIS — J339 Nasal polyp, unspecified: Secondary | ICD-10-CM

## 2014-12-07 DIAGNOSIS — Z91048 Other nonmedicinal substance allergy status: Secondary | ICD-10-CM

## 2014-12-07 DIAGNOSIS — Z9109 Other allergy status, other than to drugs and biological substances: Secondary | ICD-10-CM

## 2014-12-07 MED ORDER — MOMETASONE FUROATE 50 MCG/ACT NA SUSP: 2.0000 | Freq: Every day | NASAL | Status: DC

## 2014-12-07 NOTE — Patient Instructions (Signed)
-Continue Claritin as prescribed.  -Take Nasonex as prescribed.  -Nasonex-  Take 2 sprays in each nostril at bedtime.  Make sure you spray towards the outside of each nostril towards the outer corner of your eye, hold nose close and tilt head back.  This will help the medication get into your sinuses.  If you do not like this medication, then use saline nasal sprays same directions as above for Nasonex.  Donna Dillon will call you with date and time for appt with Dr. Radene Journey.   Allergies Allergies may happen from anything your body is sensitive to. This may be food, medicines, pollens, chemicals, and nearly anything around you in everyday life that produces allergens. An allergen is anything that causes an allergy producing substance. Heredity is often a factor in causing these problems. This means you may have some of the same allergies as your parents. Food allergies happen in all age groups. Food allergies are some of the most severe and life threatening. Some common food allergies are cow's milk, seafood, eggs, nuts, wheat, and soybeans. SYMPTOMS   Swelling around the mouth.  An itchy red rash or hives.  Vomiting or diarrhea.  Difficulty breathing. SEVERE ALLERGIC REACTIONS ARE LIFE-THREATENING. This reaction is called anaphylaxis. It can cause the mouth and throat to swell and cause difficulty with breathing and swallowing. In severe reactions only a trace amount of food (for example, peanut oil in a salad) may cause death within seconds. Seasonal allergies occur in all age groups. These are seasonal because they usually occur during the same season every year. They may be a reaction to molds, grass pollens, or tree pollens. Other causes of problems are house dust mite allergens, pet dander, and mold spores. The symptoms often consist of nasal congestion, a runny itchy nose associated with sneezing, and tearing itchy eyes. There is often an associated itching of the mouth and ears. The  problems happen when you come in contact with pollens and other allergens. Allergens are the particles in the air that the body reacts to with an allergic reaction. This causes you to release allergic antibodies. Through a chain of events, these eventually cause you to release histamine into the blood stream. Although it is meant to be protective to the body, it is this release that causes your discomfort. This is why you were given anti-histamines to feel better. If you are unable to pinpoint the offending allergen, it may be determined by skin or blood testing. Allergies cannot be cured but can be controlled with medicine. Hay fever is a collection of all or some of the seasonal allergy problems. It may often be treated with simple over-the-counter medicine such as diphenhydramine. Take medicine as directed. Do not drink alcohol or drive while taking this medicine. Check with your caregiver or package insert for child dosages. If these medicines are not effective, there are many new medicines your caregiver can prescribe. Stronger medicine such as nasal spray, eye drops, and corticosteroids may be used if the first things you try do not work well. Other treatments such as immunotherapy or desensitizing injections can be used if all else fails. Follow up with your caregiver if problems continue. These seasonal allergies are usually not life threatening. They are generally more of a nuisance that can often be handled using medicine. HOME CARE INSTRUCTIONS   If unsure what causes a reaction, keep a diary of foods eaten and symptoms that follow. Avoid foods that cause reactions.  If hives or rash are  present:  Take medicine as directed.  You may use an over-the-counter antihistamine (diphenhydramine) for hives and itching as needed.  Apply cold compresses (cloths) to the skin or take baths in cool water. Avoid hot baths or showers. Heat will make a rash and itching worse.  If you are severely  allergic:  Following a treatment for a severe reaction, hospitalization is often required for closer follow-up.  Wear a medic-alert bracelet or necklace stating the allergy.  You and your family must learn how to give adrenaline or use an anaphylaxis kit.  If you have had a severe reaction, always carry your anaphylaxis kit or EpiPen with you. Use this medicine as directed by your caregiver if a severe reaction is occurring. Failure to do so could have a fatal outcome. SEEK MEDICAL CARE IF:  You suspect a food allergy. Symptoms generally happen within 30 minutes of eating a food.  Your symptoms have not gone away within 2 days or are getting worse.  You develop new symptoms.  You want to retest yourself or your child with a food or drink you think causes an allergic reaction. Never do this if an anaphylactic reaction to that food or drink has happened before. Only do this under the care of a caregiver. SEEK IMMEDIATE MEDICAL CARE IF:   You have difficulty breathing, are wheezing, or have a tight feeling in your chest or throat.  You have a swollen mouth, or you have hives, swelling, or itching all over your body.  You have had a severe reaction that has responded to your anaphylaxis kit or an EpiPen. These reactions may return when the medicine has worn off. These reactions should be considered life threatening. MAKE SURE YOU:   Understand these instructions.  Will watch your condition.  Will get help right away if you are not doing well or get worse. Document Released: 02/18/2003 Document Revised: 03/22/2013 Document Reviewed: 07/25/2008 [facility] Patient Information 2015 [place], [place]. This information is not intended to replace advice given to you by your health care provider. Make sure you discuss any questions you have with your health care provider.  Sinusitis Sinusitis is redness, soreness, and inflammation of the paranasal sinuses. Paranasal sinuses are air pockets  within the bones of your face (beneath the eyes, the middle of the forehead, or above the eyes). In healthy paranasal sinuses, mucus is able to drain out, and air is able to circulate through them by way of your nose. However, when your paranasal sinuses are inflamed, mucus and air can become trapped. This can allow bacteria and other germs to grow and cause infection. Sinusitis can develop quickly and last only a short time (acute) or continue over a long period (chronic). Sinusitis that lasts for more than 12 weeks is considered chronic.  CAUSES  Causes of sinusitis include:  Allergies.  Structural abnormalities, such as displacement of the cartilage that separates your nostrils (deviated septum), which can decrease the air flow through your nose and sinuses and affect sinus drainage.  Functional abnormalities, such as when the small hairs (cilia) that line your sinuses and help remove mucus do not work properly or are not present. SIGNS AND SYMPTOMS  Symptoms of acute and chronic sinusitis are the same. The primary symptoms are pain and pressure around the affected sinuses. Other symptoms include:  Upper toothache.  Earache.  Headache.  Bad breath.  Decreased sense of smell and taste.  A cough, which worsens when you are lying flat.  Fatigue.  Fever.  Thick drainage from your nose, which often is green and may contain pus (purulent).  Swelling and warmth over the affected sinuses. DIAGNOSIS  Your health care provider will perform a physical exam. During the exam, your health care provider may:  Look in your nose for signs of abnormal growths in your nostrils (nasal polyps).  Tap over the affected sinus to check for signs of infection.  View the inside of your sinuses (endoscopy) using an imaging device that has a light attached (endoscope). If your health care provider suspects that you have chronic sinusitis, one or more of the following tests may be  recommended:  Allergy tests.  Nasal culture. A sample of mucus is taken from your nose, sent to a lab, and screened for bacteria.  Nasal cytology. A sample of mucus is taken from your nose and examined by your health care provider to determine if your sinusitis is related to an allergy. TREATMENT  Most cases of acute sinusitis are related to a viral infection and will resolve on their own within 10 days. Sometimes medicines are prescribed to help relieve symptoms (pain medicine, decongestants, nasal steroid sprays, or saline sprays).  However, for sinusitis related to a bacterial infection, your health care provider will prescribe antibiotic medicines. These are medicines that will help kill the bacteria causing the infection.  Rarely, sinusitis is caused by a fungal infection. In theses cases, your health care provider will prescribe antifungal medicine. For some cases of chronic sinusitis, surgery is needed. Generally, these are cases in which sinusitis recurs more than 3 times per year, despite other treatments. HOME CARE INSTRUCTIONS   Drink plenty of water. Water helps thin the mucus so your sinuses can drain more easily.  Use a humidifier.  Inhale steam 3 to 4 times a day (for example, sit in the bathroom with the shower running).  Apply a warm, moist washcloth to your face 3 to 4 times a day, or as directed by your health care provider.  Use saline nasal sprays to help moisten and clean your sinuses.  Take medicines only as directed by your health care provider.  If you were prescribed either an antibiotic or antifungal medicine, finish it all even if you start to feel better. SEEK IMMEDIATE MEDICAL CARE IF:  You have increasing pain or severe headaches.  You have nausea, vomiting, or drowsiness.  You have swelling around your face.  You have vision problems.  You have a stiff neck.  You have difficulty breathing. MAKE SURE YOU:   Understand these  instructions.  Will watch your condition.  Will get help right away if you are not doing well or get worse. Document Released: 11/25/2005 Document Revised: 04/11/2014 Document Reviewed: 12/10/2011 [facility] Patient Information 2015 [place], [place]. This information is not intended to replace advice given to you by your health care provider. Make sure you discuss any questions you have with your health care provider.

## 2014-12-07 NOTE — Progress Notes (Signed)
Subjective:    Patient ID: Donna Dillon, female    DOB: 1946-08-22, 68 y.o.   MRN: [id number]  HPI  A Caucasian 68yo female presents to the office today due to possible nasal polyp in right nostril.  Patient states she just noticed it on 12/05/14.  Patient states she feels like she has something in there and is rubbing it all the time.  She states that weather changes make her sinus issues worse.  Patient is concerned that it can be cancer.  Reassured patient that it most likely is not and most likely from allergies.  Patient states she has been taking Claritin over the counter and denies environmental allergies, except for dust. GFR= >89 on 11/10/14 Review of Systems  Constitutional: Negative.   HENT: Positive for congestion, ear pain and rhinorrhea. Negative for postnasal drip, sinus pressure, sore throat, trouble swallowing and voice change.        Congestion in throat. Right ear pain.  Eyes: Negative.   Respiratory: Negative.   Cardiovascular: Negative.   Gastrointestinal: Negative.   Skin: Negative.  Negative for rash.  Neurological: Positive for headaches. Negative for dizziness and light-headedness.   Past Medical History  Diagnosis Date  . GERD (gastroesophageal reflux disease)   . Esophageal stricture   . Diverticulosis   . Colon polyp     hyperplastic  . Tachycardia   . Anemia     mild  . Hyperlipidemia   . Hypertension   . Prediabetes   . Palpitations   . Anxiety    Current Outpatient Prescriptions on File Prior to Visit  Medication Sig Dispense Refill  . atenolol (TENORMIN) 25 MG tablet Take 1/2 to 1 pill daily for blood pressure 30 tablet 1  . Calcium 1500 MG tablet Take 1,500 mg by mouth.    . cholecalciferol (VITAMIN D) 1000 UNITS tablet Take 1,000 Units by mouth daily.    . clomiPRAMINE (ANAFRANIL) 50 MG capsule Take 50 mg by mouth at bedtime.    . clonazePAM (KLONOPIN) 1 MG tablet Take 1 mg by mouth at bedtime.    Marland Kitchen ezetimibe (ZETIA) 10 MG tablet Take 10  mg by mouth daily.    . fish oil-omega-3 fatty acids 1000 MG capsule Take 1 g by mouth daily.    . magnesium 30 MG tablet Take 30 mg by mouth 2 (two) times daily.    . Multiple Vitamin (MULTIVITAMIN) tablet Take 1 tablet by mouth daily.    . vitamin C (ASCORBIC ACID) 500 MG tablet Take 500 mg by mouth daily.    Marland Kitchen triamcinolone cream (KENALOG) 0.1 % Apply topically 4 (four) times daily. (Patient not taking: Reported on 12/07/2014) 85.2 g 99   No current facility-administered medications on file prior to visit.   Allergies  Allergen Reactions  . Asa [Aspirin] Diarrhea  . Levaquin [Levofloxacin] Other (See Comments)    Joint Pain  . Macrolides And Ketolides Nausea And Vomiting  . Paxil [Paroxetine Hcl] Nausea And Vomiting  . Penicillins Hives  . Prednisone     Altered mental state  . Tetracyclines & Related Nausea And Vomiting  . Zoloft [Sertraline Hcl] Nausea And Vomiting  . Sulfa Antibiotics Rash  . Trimethoprim Rash     BP 118/64 mmHg  Pulse 78  Temp(Src) 98 F (36.7 C) (Temporal)  Resp 16  Ht 5\' 7"  (1.702 m)  Wt 163 lb (73.936 kg)  BMI 25.52 kg/m2  SpO2 98%  LMP 12/09/1994 Wt Readings from Last 3 Encounters:  12/07/14 163 lb (73.936 kg)  11/10/14 164 lb 3.2 oz (74.481 kg)  10/13/14 163 lb (73.936 kg)   Objective:   Physical Exam  Constitutional: She is oriented to person, place, and time. She appears well-developed and well-nourished. She does not have a sickly appearance. No distress.  HENT:  Head: Normocephalic.  Right Ear: Tympanic membrane, external ear and ear canal normal.  Left Ear: Tympanic membrane, external ear and ear canal normal.  Nose: Mucosal edema present. Right sinus exhibits no maxillary sinus tenderness and no frontal sinus tenderness. Left sinus exhibits no maxillary sinus tenderness and no frontal sinus tenderness.  Mouth/Throat: Uvula is midline, oropharynx is clear and moist and mucous membranes are normal. Mucous membranes are not pale and not  dry. No trismus in the jaw. No uvula swelling. No oropharyngeal exudate, posterior oropharyngeal edema or posterior oropharyngeal erythema.  Nasal polyp seen in right nare on lateral side.  1-8mm closed comedone seen in right nare on upper rim on medial side.  Eyes: Conjunctivae and lids are normal. Pupils are equal, round, and reactive to light. Right eye exhibits no discharge. Left eye exhibits no discharge. No scleral icterus.  Neck: Trachea normal, normal range of motion and phonation normal. Neck supple. No tracheal tenderness present. No tracheal deviation present.  Cardiovascular: Normal rate, regular rhythm, S1 normal, S2 normal, normal heart sounds and normal pulses.  Exam reveals no gallop, no distant heart sounds and no friction rub.   No murmur heard. Pulmonary/Chest: Effort normal and breath sounds normal. No stridor. No respiratory distress. She has no decreased breath sounds. She has no wheezes. She has no rhonchi. She has no rales. She exhibits no tenderness.  Abdominal: Soft. Bowel sounds are normal.  Lymphadenopathy:  No tenderness or LAD.  Neurological: She is alert and oriented to person, place, and time. Gait normal.  Skin: Skin is warm, dry and intact. No rash noted. She is not diaphoretic. No pallor.  Psychiatric: She has a normal mood and affect. Her speech is normal and behavior is normal. Judgment and thought content normal. Cognition and memory are normal.  Vitals reviewed.  Assessment & Plan:  1. Nasal polyp and allergies? - Ambulatory referral to ENT (Dr. Radene Journey)- due to nasal polyp and chronic sinusitis -Take Nasonex as prescribed for inflammation-  mometasone (NASONEX) 50 MCG/ACT nasal spray; Place 2 sprays into the nose daily.  Dispense: 17 g; Refill: 2 Continue Claritin OTC- follow directions on container.  Discussed medication effects and SE's.  Pt agreed to treatment plan. If you are not feeling better in 10-14 days, then please call the office. Please  keep your physical appt on 03/09/15.  Burrell Hodapp, Stephani Police, PA-C 11:32 AM [facility]

## 2015-02-24 ENCOUNTER — Other Ambulatory Visit: Payer: Self-pay | Admitting: Internal Medicine

## 2015-03-09 ENCOUNTER — Encounter: Payer: Self-pay | Admitting: Internal Medicine

## 2015-03-09 ENCOUNTER — Ambulatory Visit (INDEPENDENT_AMBULATORY_CARE_PROVIDER_SITE_OTHER): Payer: Medicare Other | Admitting: Internal Medicine

## 2015-03-09 VITALS — BP 102/64 | HR 64 | Temp 97.7°F | Resp 16 | Ht 66.75 in | Wt 156.8 lb

## 2015-03-09 DIAGNOSIS — R7303 Prediabetes: Secondary | ICD-10-CM

## 2015-03-09 DIAGNOSIS — Z79899 Other long term (current) drug therapy: Secondary | ICD-10-CM

## 2015-03-09 DIAGNOSIS — Z1331 Encounter for screening for depression: Secondary | ICD-10-CM

## 2015-03-09 DIAGNOSIS — Z1212 Encounter for screening for malignant neoplasm of rectum: Secondary | ICD-10-CM

## 2015-03-09 DIAGNOSIS — Z0000 Encounter for general adult medical examination without abnormal findings: Secondary | ICD-10-CM

## 2015-03-09 DIAGNOSIS — E559 Vitamin D deficiency, unspecified: Secondary | ICD-10-CM

## 2015-03-09 DIAGNOSIS — I10 Essential (primary) hypertension: Secondary | ICD-10-CM

## 2015-03-09 DIAGNOSIS — K219 Gastro-esophageal reflux disease without esophagitis: Secondary | ICD-10-CM

## 2015-03-09 DIAGNOSIS — Z9181 History of falling: Secondary | ICD-10-CM

## 2015-03-09 DIAGNOSIS — R6889 Other general symptoms and signs: Secondary | ICD-10-CM

## 2015-03-09 DIAGNOSIS — Z0001 Encounter for general adult medical examination with abnormal findings: Secondary | ICD-10-CM

## 2015-03-09 DIAGNOSIS — F32A Depression, unspecified: Secondary | ICD-10-CM

## 2015-03-09 DIAGNOSIS — M8580 Other specified disorders of bone density and structure, unspecified site: Secondary | ICD-10-CM

## 2015-03-09 DIAGNOSIS — F329 Major depressive disorder, single episode, unspecified: Secondary | ICD-10-CM

## 2015-03-09 DIAGNOSIS — E785 Hyperlipidemia, unspecified: Secondary | ICD-10-CM

## 2015-03-09 DIAGNOSIS — R7309 Other abnormal glucose: Secondary | ICD-10-CM

## 2015-03-09 LAB — BASIC METABOLIC PANEL WITH GFR
BUN: 18 mg/dL (ref 6–23)
CHLORIDE: 104 meq/L (ref 96–112)
CO2: 25 mEq/L (ref 19–32)
CREATININE: 0.69 mg/dL (ref 0.50–1.10)
Calcium: 9.3 mg/dL (ref 8.4–10.5)
GFR, Est African American: >89 mL/min
GFR, Est Non African American: >89 mL/min
GLUCOSE: 85 mg/dL (ref 70–99)
Potassium: 4 mEq/L (ref 3.5–5.3)
Sodium: 138 mEq/L (ref 135–145)

## 2015-03-09 LAB — HEPATIC FUNCTION PANEL
ALBUMIN: 4.3 g/dL (ref 3.5–5.2)
ALK PHOS: 65 U/L (ref 39–117)
ALT: 13 U/L (ref 0–35)
AST: 9 U/L (ref 0–37)
Bilirubin, Direct: 0.1 mg/dL (ref 0.0–0.3)
Indirect Bilirubin: 0.3 mg/dL (ref 0.2–1.2)
Total Bilirubin: 0.4 mg/dL (ref 0.2–1.2)
Total Protein: 6.5 g/dL (ref 6.0–8.3)

## 2015-03-09 LAB — CBC WITH DIFFERENTIAL/PLATELET
BASOS ABS: 0.1 10*3/uL (ref 0.0–0.1)
BASOS PCT: 1 % (ref 0–1)
EOS ABS: 0.2 10*3/uL (ref 0.0–0.7)
EOS PCT: 4 % (ref 0–5)
HCT: 41.2 % (ref 36.0–46.0)
HEMOGLOBIN: 13.4 g/dL (ref 12.0–15.0)
LYMPHS ABS: 2.1 10*3/uL (ref 0.7–4.0)
Lymphocytes Relative: 38 % (ref 12–46)
MCH: 28.8 pg (ref 26.0–34.0)
MCHC: 32.5 g/dL (ref 30.0–36.0)
MCV: 88.4 fL (ref 78.0–100.0)
MPV: 9.6 fL (ref 8.6–12.4)
Monocytes Absolute: 0.5 10*3/uL (ref 0.1–1.0)
Monocytes Relative: 9 % (ref 3–12)
NEUTROS ABS: 2.6 10*3/uL (ref 1.7–7.7)
NEUTROS PCT: 48 % (ref 43–77)
PLATELETS: 219 10*3/uL (ref 150–400)
RBC: 4.66 MIL/uL (ref 3.87–5.11)
RDW: 13.7 % (ref 11.5–15.5)
WBC: 5.4 10*3/uL (ref 4.0–10.5)

## 2015-03-09 LAB — INSULIN, RANDOM: Insulin: 17.2 u[IU]/mL (ref 2.0–19.6)

## 2015-03-09 LAB — LIPID PANEL
CHOLESTEROL: 176 mg/dL (ref 0–200)
HDL: 51 mg/dL (ref >=46)
LDL Cholesterol: 107 mg/dL — ABNORMAL HIGH (ref 0–99)
Total CHOL/HDL Ratio: 3.5 Ratio
Triglycerides: 91 mg/dL (ref <150)
VLDL: 18 mg/dL (ref 0–40)

## 2015-03-09 LAB — MAGNESIUM: Magnesium: 2.2 mg/dL (ref 1.5–2.5)

## 2015-03-09 NOTE — Patient Instructions (Signed)
Recommend the book "The END of DIETING" by Dr Excell Seltzer   & the book "The END of DIABETES " by Dr Excell Seltzer  At [email or web address] - get book & Audio CD's      Being diabetic has a  300% increased risk for heart attack, stroke, cancer, and alzheimer- type vascular dementia. It is very important that you work harder with diet by avoiding all foods that are white. Avoid white rice (brown & wild rice is OK), white potatoes (sweetpotatoes in moderation is OK), White bread or wheat bread or anything made out of white flour like bagels, donuts, rolls, buns, biscuits, cakes, pastries, cookies, pizza crust, and pasta (made from white flour & egg whites) - vegetarian pasta or spinach or wheat pasta is OK. Multigrain breads like Arnold's or [employer], or multigrain sandwich thins or flatbreads.  Diet, exercise and weight loss can reverse and cure diabetes in the early stages.  Diet, exercise and weight loss is very important in the control and prevention of complications of diabetes which affects every system in your body, ie. Brain - dementia/stroke, eyes - glaucoma/blindness, heart - heart attack/heart failure, kidneys - dialysis, stomach - gastric paralysis, intestines - malabsorption, nerves - severe painful neuritis, circulation - gangrene & loss of a leg(s), and finally cancer and Alzheimers.    I recommend avoid fried & greasy foods,  sweets/candy, white rice (brown or wild rice or Quinoa is OK), white potatoes (sweet potatoes are OK) - anything made from white flour - bagels, doughnuts, rolls, buns, biscuits,white and wheat breads, pizza crust and traditional pasta made of white flour & egg white(vegetarian pasta or spinach or wheat pasta is OK).  Multi-grain bread is OK - like multi-grain flat bread or sandwich thins. Avoid alcohol in excess. Exercise is also important.    Eat all the vegetables you want - avoid meat, especially red meat and dairy - especially cheese.  Cheese is the most  concentrated form of trans-fats which is the worst thing to clog up our arteries. Veggie cheese is OK which can be found in the fresh produce section at [employer] or [employer] or [employer]  Preventive Care for Adults A healthy lifestyle and preventive care can promote health and wellness. Preventive health guidelines for men include the following key practices:  A routine yearly physical is a good way to check with your health care provider about your health and preventative screening. It is a chance to share any concerns and updates on your health and to receive a thorough exam.  Visit your dentist for a routine exam and preventative care every 6 months. Brush your teeth twice a day and floss once a day. Good oral hygiene prevents tooth decay and gum disease.  The frequency of eye exams is based on your age, health, family medical history, use of contact lenses, and other factors. Follow your health care provider's recommendations for frequency of eye exams.  Eat a healthy diet. Foods such as vegetables, fruits, whole grains, low-fat dairy products, and lean protein foods contain the nutrients you need without too many calories. Decrease your intake of foods high in solid fats, added sugars, and salt. Eat the right amount of calories for you.Get information about a proper diet from your health care provider, if necessary.  Regular physical exercise is one of the most important things you can do for your health. Most adults should get at least 150 minutes of moderate-intensity exercise (any activity that increases your heart rate  and causes you to sweat) each week. In addition, most adults need muscle-strengthening exercises on 2 or more days a week.  Maintain a healthy weight. The body mass index (BMI) is a screening tool to identify possible weight problems. It provides an estimate of body fat based on height and weight. Your health care provider can find your BMI and can help you achieve or  maintain a healthy weight.For adults 20 years and older:  A BMI below 18.5 is considered underweight.  A BMI of 18.5 to 24.9 is normal.  A BMI of 25 to 29.9 is considered overweight.  A BMI of 30 and above is considered obese.  Maintain normal blood lipids and cholesterol levels by exercising and minimizing your intake of saturated fat. Eat a balanced diet with plenty of fruit and vegetables. Blood tests for lipids and cholesterol should begin at age 47 and be repeated every 5 years. If your lipid or cholesterol levels are high, you are over 50, or you are at high risk for heart disease, you may need your cholesterol levels checked more frequently.Ongoing high lipid and cholesterol levels should be treated with medicines if diet and exercise are not working.  If you smoke, find out from your health care provider how to quit. If you do not use tobacco, do not start.  Lung cancer screening is recommended for adults aged 40-80 years who are at high risk for developing lung cancer because of a history of smoking. A yearly low-dose CT scan of the lungs is recommended for people who have at least a 30-pack-year history of smoking and are a current smoker or have quit within the past 15 years. A pack year of smoking is smoking an average of 1 pack of cigarettes a day for 1 year (for example: 1 pack a day for 30 years or 2 packs a day for 15 years). Yearly screening should continue until the smoker has stopped smoking for at least 15 years. Yearly screening should be stopped for people who develop a health problem that would prevent them from having lung cancer treatment.  If you choose to drink alcohol, do not have more than 2 drinks per day. One drink is considered to be 12 ounces (355 mL) of beer, 5 ounces (148 mL) of wine, or 1.5 ounces (44 mL) of liquor.  Avoid use of street drugs. Do not share needles with anyone. Ask for help if you need support or instructions about stopping the use of  drugs.  High blood pressure causes heart disease and increases the risk of stroke. Your blood pressure should be checked at least every 1-2 years. Ongoing high blood pressure should be treated with medicines, if weight loss and exercise are not effective.  If you are 58-53 years old, ask your health care provider if you should take aspirin to prevent heart disease.  Diabetes screening involves taking a blood sample to check your fasting blood sugar level. Testing should be considered at a younger age or be carried out more frequently if you are overweight and have at least 1 risk factor for diabetes.  Colorectal cancer can be detected and often prevented. Most routine colorectal cancer screening begins at the age of 17 and continues through age 37. However, your health care provider may recommend screening at an earlier age if you have risk factors for colon cancer. On a yearly basis, your health care provider may provide home test kits to check for hidden blood in the stool. Use of  a small camera at the end of a tube to directly examine the colon (sigmoidoscopy or colonoscopy) can detect the earliest forms of colorectal cancer. Talk to your health care provider about this at age 39, when routine screening begins. Direct exam of the colon should be repeated every 5-10 years through age 42, unless early forms of precancerous polyps or small growths are found.  Hepatitis C blood testing is recommended for all people born from [birth year] through 1965 and any individual with known risks for hepatitis C.  Screening for abdominal aortic aneurysm (AAA)  by ultrasound is recommended for people who have history of high blood pressure or who are current or former smokers.  Healthy men should  receive prostate-specific antigen (PSA) blood tests as part of routine cancer screening. Talk with your health care provider about prostate cancer screening.  Testicular cancer screening is  recommended for adult males.  Screening includes self-exam, a health care provider exam, and other screening tests. Consult with your health care provider about any symptoms you have or any concerns you have about testicular cancer.  Use sunscreen. Apply sunscreen liberally and repeatedly throughout the day. You should seek shade when your shadow is shorter than you. Protect yourself by wearing long sleeves, pants, a wide-brimmed hat, and sunglasses year round, whenever you are outdoors.  Once a month, do a whole-body skin exam, using a mirror to look at the skin on your back. Tell your health care provider about new moles, moles that have irregular borders, moles that are larger than a pencil eraser, or moles that have changed in shape or color.  Stay current with required vaccines (immunizations).  Influenza vaccine. All adults should be immunized every year.  Tetanus, diphtheria, and acellular pertussis (Td, Tdap) vaccine. An adult who has not previously received Tdap or who does not know his vaccine status should receive 1 dose of Tdap. This initial dose should be followed by tetanus and diphtheria toxoids (Td) booster doses every 10 years. Adults with an unknown or incomplete history of completing a 3-dose immunization series with Td-containing vaccines should begin or complete a primary immunization series including a Tdap dose. Adults should receive a Td booster every 10 years.  Zoster vaccine. One dose is recommended for adults aged 90 years or older unless certain conditions are present.    PREVNAR - Pneumococcal 13-valent conjugate (PCV13) vaccine. When indicated, a person who is uncertain of his immunization history and has no record of immunization should receive the PCV13 vaccine. An adult aged 13 years or older who has certain medical conditions and has not been previously immunized should receive 1 dose of PCV13 vaccine. This PCV13 should be followed with a dose of pneumococcal polysaccharide (PPSV23) vaccine. The  PPSV23 vaccine dose should be obtained at least 8 weeks after the dose of PCV13 vaccine. An adult aged 43 years or older who has certain medical conditions and previously received 1 or more doses of PPSV23 vaccine should receive 1 dose of PCV13. The PCV13 vaccine dose should be obtained 1 or more years after the last PPSV23 vaccine dose.    PNEUMOVAX - Pneumococcal polysaccharide (PPSV23) vaccine. When PCV13 is also indicated, PCV13 should be obtained first. All adults aged 57 years and older should be immunized. An adult younger than age 102 years who has certain medical conditions should be immunized. Any person who resides in a nursing home or long-term care facility should be immunized. An adult smoker should be immunized. People with an immunocompromised condition  and certain other conditions should receive both PCV13 and PPSV23 vaccines. People with human immunodeficiency virus (HIV) infection should be immunized as soon as possible after diagnosis. Immunization during chemotherapy or radiation therapy should be avoided. Routine use of PPSV23 vaccine is not recommended for American Indians, [place] Natives, or people younger than 65 years unless there are medical conditions that require PPSV23 vaccine. When indicated, people who have unknown immunization and have no record of immunization should receive PPSV23 vaccine. One-time revaccination 5 years after the first dose of PPSV23 is recommended for people aged 19-64 years who have chronic kidney failure, nephrotic syndrome, asplenia, or immunocompromised conditions. People who received 1-2 doses of PPSV23 before age 66 years should receive another dose of PPSV23 vaccine at age 3 years or later if at least 5 years have passed since the previous dose. Doses of PPSV23 are not needed for people immunized with PPSV23 at or after age 11 years.    Hepatitis A vaccine. Adults who wish to be protected from this disease, have certain high-risk conditions, work  with hepatitis A-infected animals, work in hepatitis A research labs, or travel to or work in countries with a high rate of hepatitis A should be immunized. Adults who were previously unvaccinated and who anticipate close contact with an international adoptee during the first 60 days after arrival in the [place] from a country with a high rate of hepatitis A should be immunized.    Hepatitis B vaccine. Adults should be immunized if they wish to be protected from this disease, have certain high-risk conditions, may be exposed to blood or other infectious body fluids, are household contacts or sex partners of hepatitis B positive people, are clients or workers in certain care facilities, or travel to or work in countries with a high rate of hepatitis B.   Preventive Service / Frequency   Ages 45 and over  Blood pressure check.  Lipid and cholesterol check.  Lung cancer screening. / Every year if you are aged 88-80 years and have a 30-pack-year history of smoking and currently smoke or have quit within the past 15 years. Yearly screening is stopped once you have quit smoking for at least 15 years or develop a health problem that would prevent you from having lung cancer treatment.  Fecal occult blood test (FOBT) of stool. You may not have to do this test if you get a colonoscopy every 10 years.  Flexible sigmoidoscopy** or colonoscopy.** / Every 5 years for a flexible sigmoidoscopy or every 10 years for a colonoscopy beginning at age 70 and continuing until age 60.  Hepatitis C blood test.** / For all people born from [birth year] through 1964 and any individual with known risks for hepatitis C.  Abdominal aortic aneurysm (AAA) screening./ Screening current or former smokers or have Hypertension.  Skin self-exam. / Monthly.  Influenza vaccine. / Every year.  Tetanus, diphtheria, and acellular pertussis (Tdap/Td) vaccine.** / 1 dose of Td every 10 years.   Zoster vaccine.** / 1 dose for  adults aged 35 years or older.         Pneumococcal 13-valent conjugate (PCV13) vaccine.    Pneumococcal polysaccharide (PPSV23) vaccine.     Hepatitis A vaccine.** / Consult your health care provider.  Hepatitis B vaccine.** / Consult your health care provider. Screening for abdominal aortic aneurysm (AAA)  by ultrasound is recommended for people who have history of high blood pressure or who are current or former smokers.

## 2015-03-09 NOTE — Progress Notes (Signed)
Patient ID: Donna Dillon, female   DOB: 12/29/1945, 69 y.o.   MRN: [id number]  [facility] VISIT AND CPE  Assessment:   1. Essential hypertension  - Microalbumin / creatinine urine ratio - EKG 12-Lead - TSH - [place], retroperitnl abd,  ltd  2. Hyperlipidemia  - Lipid panel  3. Prediabetes  - Hemoglobin A1c - Insulin, random  4. Vitamin D deficiency  - Vit D  25 hydroxy (rtn osteoporosis monitoring)  5. Gastroesophageal reflux disease, esophagitis presence not specified   6. Osteopenia   7. Screening for rectal cancer  - POC Hemoccult Bld/Stl (3-Cd Home Screen); Future  8. Depression screen   9. At low risk for fall   10. Medication management  - Urine Microscopic - CBC with Differential/Platelet - BASIC METABOLIC PANEL WITH GFR - Hepatic function panel - Magnesium  11. Routine general medical examination at a health care facility   12. Depression, controlled   Plan:   During the course of the visit the patient was educated and counseled about appropriate screening and preventive services including:    Pneumococcal vaccine   Influenza vaccine  Td vaccine  Screening electrocardiogram  Bone densitometry screening  Colorectal cancer screening  Diabetes screening  Glaucoma screening  Nutrition counseling   Advanced directives: requested  Screening recommendations, referrals: Vaccinations:  Immunization History  Administered Date(s) Administered  . Influenza, High Dose Seasonal PF 11/14/2014  . Pneumococcal Polysaccharide-23 02/14/2009  . Td 12/11/2006  . Tdap 12/09/2005  . Zoster 02/27/2011  Prevnar vaccine ordered Hep B vaccine not indicated  Nutrition assessed and recommended  Colonoscopy 02/2009 - > 10 yr f/u Recommended yearly ophthalmology/optometry visit for glaucoma screening and checkup Recommended yearly dental visit for hygiene and checkup Advanced directives - no - offered forms  Conditions/risks  identified: BMI: Discussed weight loss, diet, and increase physical activity.  Increase physical activity: AHA recommends 150 minutes of physical activity a week.  Medications reviewed PreDiabetes is not at goal, ACE/ARB therapy: No Indicated Urinary Incontinence is not an issue: discussed non pharmacology and pharmacology options.  Fall risk: low- discussed PT, home fall assessment, medications.   Subjective:    Donna Dillon  presents for [employer] Visit and complete physical.  Prior medicare wellness visit is unknown. This very nice 69 y.o. MWF presents for follow up with Hypertension, Hyperlipidemia, Pre-Diabetes and Vitamin D Deficiency.    Patient is treated for palpitations with labile HTN x years controlled with her atenolol  & BP has been controlled and today's BP: 102/64 mmHg. Patient has had no complaints of any cardiac type chest pain, palpitations, dyspnea/orthopnea/PND, dizziness, claudication, or dependent edema. She does exercise daily on a treadmill x 45 min/day.    Hyperlipidemia is controlled with diet & meds. Patient denies myalgias or other med SE's. Last Lipids were near goal w/ total Chol 176; HDL 51; sl elevated LDL 107; Trig 91 on 03/09/2015.   Also, the patient has history of PreDiabetes and has had no symptoms of reactive hypoglycemia, diabetic polys, paresthesias or visual blurring.  Last A1c was 5.8% on 03/09/2015.   Further, the patient also has history of Vitamin D Deficiency of 36 in 2008  and supplements vitamin D without any suspected side-effects. Last vitamin D was  49 on 03/09/2015.  Names of Other Physician/Practitioners you currently use: 1. [facility] here for primary care 2. Dr Katy Fitch, eye doctor, last visit 2015 3. Dr Jerold Coombe, dentist, last visit 2015  Patient Care Team: Unk Pinto, MD as PCP - General (Internal Medicine) Clent Jacks, MD as Consulting Physician (Ophthalmology) Irene Shipper, MD as Consulting Physician (Gastroenterology) Milus Banister, MD as Attending Physician (Gastroenterology) Megan Salon, MD as Consulting Physician (Gynecology)  Medication Review: Medication Sig  . atenolol (TENORMIN) 25 MG tablet Take 1/2 to 1 pill daily for blood pressure  . Calcium 1500 MG tablet Take 1,500 mg by mouth.  . cholecalciferol (VITAMIN D) 1000 UNITS tablet Take 1,000 Units by mouth daily.  . clomiPRAMINE (ANAFRANIL) 50 MG capsule Take 50 mg by mouth at bedtime.  . clonazePAM (KLONOPIN) 1 MG tablet Take 1 mg by mouth at bedtime.  . fish oil-omega-3 fatty acids 1000 MG capsule Take 1 g by mouth daily.  . magnesium 30 MG tablet Take 30 mg by mouth 2 (two) times daily.  . mometasone (NASONEX) 50 MCG/ACT nasal spray Place 2 sprays into the nose daily.  . Multiple Vitamin (MULTIVITAMIN) tablet Take 1 tablet by mouth daily.  Marland Kitchen triamcinolone cream (KENALOG) 0.1 % Apply topically 4 (four) times daily.  . vitamin C (ASCORBIC ACID) 500 MG tablet Take 500 mg by mouth daily.  Marland Kitchen ZETIA 10 MG tablet Take 1 tablet by mouth  every day for cholesterol   Current Problems (verified) Patient Active Problem List   Diagnosis Date Noted  . Medication management 11/10/2014  . Vitamin D deficiency 03/08/2014  . Hyperlipidemia   . Hypertension   . Prediabetes   . Palpitations   . Anxiety   . Osteopenia 02/25/2013  . ESOPHAGEAL STRICTURE 06/27/2010  . GERD 06/28/2009  . PERSONAL HX COLONIC POLYPS 06/28/2009   Screening Tests Health Maintenance  Topic Date Due  . PNA vac Low Risk Adult (1 of 2 - PCV13) 02/19/2012  . INFLUENZA VACCINE  07/10/2015  . MAMMOGRAM  01/14/2016  . TETANUS/TDAP  12/11/2016  . COLONOSCOPY  06/30/2019  . DEXA SCAN  Completed  . ZOSTAVAX  Completed   Immunization History  Administered Date(s) Administered  . Influenza, High Dose Seasonal PF 11/14/2014  . Pneumococcal Polysaccharide-23 02/14/2009  . Td 12/11/2006  . Tdap 12/09/2005  . Zoster  02/27/2011    Preventative care: Last colonoscopy: 02/2009- recc 10 yr f/u  History reviewed: allergies, current medications, past family history, past medical history, past social history, past surgical history and problem list  Risk Factors: Tobacco History  Substance Use Topics  . Smoking status: Never Smoker   . Smokeless tobacco: Never Used  . Alcohol Use: No   She does not smoke.  Patient is not a former smoker. Are there smokers in your home (other than you)?  No Alcohol Current alcohol use: none  Caffeine Current caffeine use: coffee 3 - 4 cups /day  Exercise Current exercise: walking and treadmill x 45 min every day  Nutrition/Diet Current diet: in general, a "healthy" diet    Cardiac risk factors: advanced age (older than 58 for men, 73 for women), dyslipidemia and hypertension.  Depression Screen (Note: if answer to either of the following is "Yes", a more complete depression screening is indicated)   Q1: Over the past two weeks, have you felt down, depressed or hopeless? No  Q2: Over the past two weeks, have you felt little interest or pleasure in doing things? No  Have you lost interest or pleasure in daily life? No  Do you often feel hopeless? No  Do you cry easily over simple problems? No  Activities of Daily Living In  your present state of health, do you have any difficulty performing the following activities?:  Driving? No Managing money?  No Feeding yourself? No Getting from bed to chair? No Climbing a flight of stairs? No Preparing food and eating?: No Bathing or showering? No Getting dressed: No Getting to the toilet? No Using the toilet:No Moving around from place to place: No In the past year have you fallen or had a near fall?:No   Are you sexually active?  Yes  Do you have more than one partner?  No  Vision Difficulties: No  Hearing Difficulties: No Do you often ask people to speak up or repeat themselves? No Do you experience  ringing or noises in your ears? No Do you have difficulty understanding soft or whispered voices? Sometimes.  Cognition  Do you feel that you have a problem with memory?No  Do you often misplace items? No  Do you feel safe at home?  Yes  Advanced directives Does patient have a [place]? No- offered forms Does patient have a Living Will? No - offered forms  Past Medical History  Diagnosis Date  . GERD (gastroesophageal reflux disease)   . Esophageal stricture   . Diverticulosis   . Colon polyp     hyperplastic  . Tachycardia   . Anemia     mild  . Hyperlipidemia   . Hypertension   . Prediabetes   . Palpitations   . Anxiety    Past Surgical History  Procedure Laterality Date  . Tonsillectomy    . Breast biopsy Left   . Cesarean section      x1    ROS: Constitutional: Denies fever, chills, weight loss/gain, headaches, insomnia, fatigue, night sweats, and change in appetite. Eyes: Denies redness, blurred vision, diplopia, discharge, itchy, watery eyes.  ENT: Denies discharge, congestion, post nasal drip, epistaxis, sore throat, earache, hearing loss, dental pain, Tinnitus, Vertigo, Sinus pain, snoring.  Cardio: Denies chest pain, palpitations, irregular heartbeat, syncope, dyspnea, diaphoresis, orthopnea, PND, claudication, edema Respiratory: denies cough, dyspnea, DOE, pleurisy, hoarseness, laryngitis, wheezing.  Gastrointestinal: Denies dysphagia, heartburn, reflux, water brash, pain, cramps, nausea, vomiting, bloating, diarrhea, constipation, hematemesis, melena, hematochezia, jaundice, hemorrhoids Genitourinary: Denies dysuria, frequency, urgency, nocturia, hesitancy, discharge, hematuria, flank pain Breast: Breast lumps, nipple discharge, bleeding.  Musculoskeletal: Denies arthralgia, myalgia, stiffness, Jt. Swelling, pain, limp, and strain/sprain. Denies falls. Skin: Denies puritis, rash, hives, warts, acne, eczema, changing in skin lesion Neuro:  No weakness, tremor, incoordination, spasms, paresthesia, pain Psychiatric: Denies confusion, memory loss, sensory loss. Denies Depression. Endocrine: Denies change in weight, skin, hair change, nocturia, and paresthesia, diabetic polys, visual blurring, hyper / hypo glycemic episodes.  Heme/Lymph: No excessive bleeding, bruising, enlarged lymph nodes  Objective:     BP 102/64   Pulse 64  Temp 97.7 F   Resp 16  Ht 5' 6.75"   Wt 156 lb 12.8 oz      BMI 24.76   LMP 1996  General Appearance: Well nourished, alert, WD/WN, female and in no apparent distress. Eyes: PERRLA, EOMs, conjunctiva no swelling or erythema, normal fundi and vessels. Sinuses: No frontal/maxillary tenderness ENT/Mouth: EACs patent / TMs  nl. Nares clear without erythema, swelling, mucoid exudates. Oral hygiene is good. No erythema, swelling, or exudate. Tongue normal, non-obstructing. Tonsils not swollen or erythematous. Hearing normal.  Neck: Supple, thyroid normal. No bruits, nodes or JVD. Respiratory: Respiratory effort normal.  BS equal and clear bilateral without rales, rhonci, wheezing or stridor. Cardio: Heart sounds are  normal with regular rate and rhythm and no murmurs, rubs or gallops. Peripheral pulses are normal and equal bilaterally without edema. No aortic or femoral bruits. Chest: symmetric with normal excursions and percussion. Breasts: Symmetric, without lumps, nipple discharge, retractions, or fibrocystic changes.  Abdomen: Flat, soft  with nl bowel sounds. Nontender, no guarding, rebound, hernias, masses, or organomegaly.  Lymphatics: Non tender without lymphadenopathy.  Musculoskeletal: Full ROM all peripheral extremities, joint stability, 5/5 strength, and normal gait. Skin: Warm and dry without rashes, lesions, cyanosis, clubbing or  ecchymosis.  Neuro: Cranial nerves intact, reflexes equal bilaterally. Normal muscle tone, no cerebellar symptoms. Sensation intact.  Pysch: Awake and oriented X 3,  normal affect, Insight and Judgment appropriate.   Cognitive Testing  Alert? Yes  Normal Appearance?Yes  Oriented to person? Yes  Place? Yes   Time? Yes  Recall of three objects?  Yes  Can perform simple calculations? Yes  Displays appropriate judgment? Yes  Can read the correct time from a watch/clock?Yes  Medicare Attestation I have personally reviewed: The patient's medical and social history Their use of alcohol, tobacco or illicit drugs Their current medications and supplements The patient's functional ability including ADLs,fall risks, home safety risks, cognitive, and hearing and visual impairment Diet and physical activities Evidence for depression or mood disorders  The patient's weight, height, BMI, and visual acuity have been recorded in the chart.  I have made referrals, counseling, and provided education to the patient based on review of the above and I have provided the patient with a written personalized care plan for preventive services.  Over 40 minutes of exam, counseling, chart review was performed.   Dahmir Epperly DAVID, MD   03/12/2015

## 2015-03-10 LAB — URINALYSIS, MICROSCOPIC ONLY
Bacteria, UA: NONE SEEN
CRYSTALS: NONE SEEN
Casts: NONE SEEN
SQUAMOUS EPITHELIAL / LPF: NONE SEEN

## 2015-03-10 LAB — HEMOGLOBIN A1C
HEMOGLOBIN A1C: 5.8 % — AB (ref <5.7)
Mean Plasma Glucose: 120 mg/dL — ABNORMAL HIGH (ref <117)

## 2015-03-10 LAB — TSH: TSH: 1.554 u[IU]/mL (ref 0.350–4.500)

## 2015-03-10 LAB — MICROALBUMIN / CREATININE URINE RATIO
Creatinine, Urine: 17.8 mg/dL
Microalb Creat Ratio: 16.9 mg/g (ref 0.0–30.0)
Microalb, Ur: 0.3 mg/dL (ref <2.0)

## 2015-03-10 LAB — VITAMIN D 25 HYDROXY (VIT D DEFICIENCY, FRACTURES): Vit D, 25-Hydroxy: 49 ng/mL (ref 30–100)

## 2015-04-10 ENCOUNTER — Other Ambulatory Visit: Payer: Self-pay | Admitting: *Deleted

## 2015-04-10 DIAGNOSIS — Z1212 Encounter for screening for malignant neoplasm of rectum: Secondary | ICD-10-CM

## 2015-04-10 LAB — POC HEMOCCULT BLD/STL (HOME/3-CARD/SCREEN)
Card #3 Fecal Occult Blood, POC: NEGATIVE
FECAL OCCULT BLD: NEGATIVE
Fecal Occult Blood, POC: NEGATIVE

## 2015-06-02 ENCOUNTER — Other Ambulatory Visit: Payer: Self-pay | Admitting: Internal Medicine

## 2015-06-05 ENCOUNTER — Encounter: Payer: Self-pay | Admitting: Internal Medicine

## 2015-06-05 ENCOUNTER — Other Ambulatory Visit: Payer: Self-pay

## 2015-06-15 ENCOUNTER — Ambulatory Visit (INDEPENDENT_AMBULATORY_CARE_PROVIDER_SITE_OTHER): Payer: Medicare Other | Admitting: Internal Medicine

## 2015-06-15 ENCOUNTER — Encounter: Payer: Self-pay | Admitting: Internal Medicine

## 2015-06-15 VITALS — BP 104/62 | HR 70 | Temp 98.0°F | Resp 16 | Ht 67.0 in | Wt 157.0 lb

## 2015-06-15 DIAGNOSIS — R7309 Other abnormal glucose: Secondary | ICD-10-CM

## 2015-06-15 DIAGNOSIS — I10 Essential (primary) hypertension: Secondary | ICD-10-CM

## 2015-06-15 DIAGNOSIS — R7303 Prediabetes: Secondary | ICD-10-CM

## 2015-06-15 DIAGNOSIS — E559 Vitamin D deficiency, unspecified: Secondary | ICD-10-CM

## 2015-06-15 DIAGNOSIS — Z79899 Other long term (current) drug therapy: Secondary | ICD-10-CM

## 2015-06-15 DIAGNOSIS — E785 Hyperlipidemia, unspecified: Secondary | ICD-10-CM

## 2015-06-15 DIAGNOSIS — E669 Obesity, unspecified: Secondary | ICD-10-CM

## 2015-06-15 LAB — CBC WITH DIFFERENTIAL/PLATELET
Basophils Absolute: 0.1 10*3/uL (ref 0.0–0.1)
Basophils Relative: 1 % (ref 0–1)
Eosinophils Absolute: 0.3 10*3/uL (ref 0.0–0.7)
Eosinophils Relative: 4 % (ref 0–5)
HCT: 42.1 % (ref 36.0–46.0)
Hemoglobin: 13.8 g/dL (ref 12.0–15.0)
LYMPHS ABS: 2.6 10*3/uL (ref 0.7–4.0)
LYMPHS PCT: 36 % (ref 12–46)
MCH: 29 pg (ref 26.0–34.0)
MCHC: 32.8 g/dL (ref 30.0–36.0)
MCV: 88.4 fL (ref 78.0–100.0)
MPV: 9.2 fL (ref 8.6–12.4)
Monocytes Absolute: 0.6 10*3/uL (ref 0.1–1.0)
Monocytes Relative: 8 % (ref 3–12)
NEUTROS ABS: 3.7 10*3/uL (ref 1.7–7.7)
NEUTROS PCT: 51 % (ref 43–77)
PLATELETS: 244 10*3/uL (ref 150–400)
RBC: 4.76 MIL/uL (ref 3.87–5.11)
RDW: 13.6 % (ref 11.5–15.5)
WBC: 7.2 10*3/uL (ref 4.0–10.5)

## 2015-06-15 LAB — BASIC METABOLIC PANEL WITH GFR
BUN: 18 mg/dL (ref 6–23)
CHLORIDE: 101 meq/L (ref 96–112)
CO2: 27 meq/L (ref 19–32)
Calcium: 9.7 mg/dL (ref 8.4–10.5)
Creat: 0.76 mg/dL (ref 0.50–1.10)
GFR, Est Non African American: 81 mL/min
Glucose, Bld: 94 mg/dL (ref 70–99)
Potassium: 4.1 mEq/L (ref 3.5–5.3)
SODIUM: 140 meq/L (ref 135–145)

## 2015-06-15 LAB — LIPID PANEL
CHOLESTEROL: 203 mg/dL — AB (ref 0–200)
HDL: 66 mg/dL (ref >=46)
LDL CALC: 102 mg/dL — AB (ref 0–99)
Total CHOL/HDL Ratio: 3.1 Ratio
Triglycerides: 174 mg/dL — ABNORMAL HIGH (ref <150)
VLDL: 35 mg/dL (ref 0–40)

## 2015-06-15 LAB — MAGNESIUM: Magnesium: 2.2 mg/dL (ref 1.5–2.5)

## 2015-06-15 LAB — HEPATIC FUNCTION PANEL
ALBUMIN: 4.3 g/dL (ref 3.5–5.2)
ALK PHOS: 72 U/L (ref 39–117)
ALT: 14 U/L (ref 0–35)
AST: 9 U/L (ref 0–37)
BILIRUBIN INDIRECT: 0.4 mg/dL (ref 0.2–1.2)
BILIRUBIN TOTAL: 0.5 mg/dL (ref 0.2–1.2)
Bilirubin, Direct: 0.1 mg/dL (ref 0.0–0.3)
Total Protein: 6.8 g/dL (ref 6.0–8.3)

## 2015-06-15 NOTE — Patient Instructions (Signed)
We want weight loss that will last so you should lose 1-2 pounds a week.  THAT IS IT! Please pick THREE things a month to change. Once it is a habit check off the item. Then pick another three items off the list to become habits.  If you are already doing a habit on the list GREAT!  Cross that item off! o Don't drink your calories. Ie, alcohol, soda, fruit juice, and sweet tea.  o Drink more water. Drink a glass when you feel hungry or before each meal.  o Eat breakfast - Complex carb and protein (likeDannon light and fit yogurt, oatmeal, fruit, eggs, [place] bacon). o Measure your cereal.  Eat no more than one cup a day. (ie [place]) o Eat an apple a day. o Add a vegetable a day. o Try a new vegetable a month. o Use Pam! Stop using oil or butter to cook. o Don't finish your plate or use smaller plates. o Share your dessert. o Eat sugar free Jello for dessert or frozen grapes. o Don't eat 2-3 hours before bed. o Switch to whole wheat bread, pasta, and brown rice. o Make healthier choices when you eat out. No fries! o Pick baked chicken, NOT fried. o Don't forget to SLOW DOWN when you eat. It is not going anywhere.  o Take the stairs. o Park far away in the parking lot o [employer] (or weights) for 10 minutes while watching TV. o Walk at work for 10 minutes during break. o Walk outside 1 time a week with your friend, kids, dog, or significant other. o Start a walking group at [place] the mall as much as you can tolerate.  o Keep a food diary. o Weigh yourself daily. o Walk for 15 minutes 3 days per week. o Cook at home more often and eat out less.  If life happens and you go back to old habits, it is okay.  Just start over. You can do it!   If you experience chest pain, get short of breath, or tired during the exercise, please stop immediately and inform your doctor.   Ways to cut 100 calories  1. Eat your eggs with hot sauce OR salsa instead of cheese.  Eggs are great for  breakfast, but many people consider eggs and cheese to be BFFs. Instead of cheese-1 oz. of cheddar has 114 calories-top your eggs with hot sauce, which contains no calories and helps with satiety and metabolism. Salsa is also a great option!!  2. Top your toast, waffles or pancakes with mashed berries instead of jelly or syrup. Half a cup of berries-fresh, frozen or thawed-has about 40 calories, compared with 2 tbsp. of maple syrup or jelly, which both have about 100 calories. The berries will also give you a good punch of fiber, which helps keep you full and satisfied and won't spike blood sugar quickly like the jelly or syrup. 3. Swap the non-fat latte for black coffee with a splash of half-and-half. Contrary to its name, that non-fat latte has 130 calories and a startling 19g of carbohydrates per 16 oz. serving. Replacing that 'light' drinkable dessert with a black coffee with a splash of half-and-half saves you more than 100 calories per 16 oz. serving. 4. Sprinkle salads with freeze-dried raspberries instead of dried cranberries. If you want a sweet addition to your nutritious salad, stay away from dried cranberries. They have a whopping 130 calories per  cup and 30g carbohydrates. Instead, sprinkle  freeze-dried raspberries guilt-free and save more than 100 calories per  cup serving, adding 3g of belly-filling fiber. 5. Go for mustard in place of mayo on your sandwich. Mustard can add really nice flavor to any sandwich, and there are tons of varieties, from spicy to honey. A serving of mayo is 95 calories, versus 10 calories in a serving of mustard. 6. Choose a DIY salad dressing instead of the store-bought kind. Mix Dijon or whole grain mustard with low-fat Kefir or red wine vinegar and garlic. 7. Use hummus as a spread instead of a dip. Use hummus as a spread on a high-fiber cracker or tortilla with a sandwich and save on calories without sacrificing taste. 8. Pick just one salad  "accessory." Salad isn't automatically a calorie winner. It's easy to over-accessorize with toppings. Instead of topping your salad with nuts, avocado and cranberries (all three will clock in at 313 calories), just pick one. The next day, choose a different accessory, which will also keep your salad interesting. You don't wear all your jewelry every day, right? 9. Ditch the white pasta in favor of spaghetti squash. One cup of cooked spaghetti squash has about 40 calories, compared with traditional spaghetti, which comes with more than 200. Spaghetti squash is also nutrient-dense. It's a good source of fiber and Vitamins A and C, and it can be eaten just like you would eat pasta-with a great tomato sauce and [place] meatballs or with pesto, tofu and spinach, for example. 10. Dress up your chili, soups and stews with non-fat [place] yogurt instead of sour cream. Just a 'dollop' of sour cream can set you back 115 calories and a whopping 12g of fat-seven of which are of the artery-clogging variety. Added bonus: [place] yogurt is packed with muscle-building protein, calcium and B Vitamins. 11. Mash cauliflower instead of mashed potatoes. One cup of traditional mashed potatoes-in all their creamy goodness-has more than 200 calories, compared to mashed cauliflower, which you can typically eat for less than 100 calories per 1 cup serving. Cauliflower is a great source of the antioxidant indole-3-carbinol (I3C), which may help reduce the risk of some cancers, like breast cancer. 12. Ditch the ice cream sundae in favor of a [place] yogurt parfait. Instead of a cup of ice cream or fro-yo for dessert, try 1 cup of nonfat Greek yogurt topped with fresh berries and a sprinkle of cacao nibs. Both toppings are packed with antioxidants, which can help reduce cellular inflammation and oxidative damage. And the comparison is a no-brainer: One cup of ice cream has about 275 calories; one cup of frozen yogurt has about 230; and a cup  of Greek yogurt has just 130, plus twice the protein, so you're less likely to return to the freezer for a second helping. 13. Put olive oil in a spray container instead of using it directly from the bottle. Each tablespoon of olive oil is 120 calories and 15g of fat. Use a mister instead of pouring it straight into the pan or onto a salad. This allows for portion control and will save you more than 100 calories. 14. When baking, substitute canned pumpkin for butter or oil. Canned pumpkin-not pumpkin pie mix-is loaded with Vitamin A, which is important for skin and eye health, as well as immunity. And the comparisons are pretty crazy:  cup of canned pumpkin has about 40 calories, compared to butter or oil, which has more than 800 calories. Yes, 800 calories. Applesauce and mashed banana can also serve as  good substitutions for butter or oil, usually in a 1:1 ratio. 15. Top casseroles with high-fiber cereal instead of breadcrumbs. Breadcrumbs are typically made with white bread, while breakfast cereals contain 5-9g of fiber per serving. Not only will you save more than 150 calories per  cup serving, the swap will also keep you more full and you'll get a metabolism boost from the added fiber. 16. Snack on pistachios instead of macadamia nuts. Believe it or not, you get the same amount of calories from 35 pistachios (100 calories) as you would from only five macadamia nuts. 17. Chow down on kale chips rather than potato chips. This is my favorite 'don't knock it 'till you try it' swap. Kale chips are so easy to make at home, and you can spice them up with a little grated parmesan or chili powder. Plus, they're a mere fraction of the calories of potato chips, but with the same crunch factor we crave so often. 18. Add seltzer and some fruit slices to your cocktail instead of soda or fruit juice. One cup of soda or fruit juice can pack on as much as 140 calories. Instead, use seltzer and fruit slices. The  fruit provides valuable phytochemicals, such as flavonoids and anthocyanins, which help to combat cancer and stave off the aging process.

## 2015-06-15 NOTE — Progress Notes (Signed)
Patient ID: Donna Dillon, female   DOB: 1946/11/29, 69 y.o.   MRN: [id number]  Assessment and Plan:  Hypertension:  -Continue medication,  -monitor blood pressure at home.  -Continue DASH diet.   -Reminder to go to the ER if any CP, SOB, nausea, dizziness, severe HA, changes vision/speech, left arm numbness and tingling, and jaw pain.  Cholesterol: -Continue diet and exercise.  -Check cholesterol.   Pre-diabetes: -Continue diet and exercise.  -Check A1C  Vitamin D Def: -check level -continue medications.   Continue diet and meds as discussed. Further disposition pending results of labs.  HPI 69 y.o. female  presents for 3 month follow up with hypertension, hyperlipidemia, prediabetes and vitamin D.   Her blood pressure has been controlled at home, today their BP is BP: 104/62 mmHg.   She does workout. She denies chest pain, shortness of breath, dizziness.   She is on cholesterol medication and denies myalgias. Her cholesterol is not at goal. The cholesterol last visit was:   Lab Results  Component Value Date   CHOL 176 03/09/2015   HDL 51 03/09/2015   LDLCALC 107* 03/09/2015   TRIG 91 03/09/2015   CHOLHDL 3.5 03/09/2015     She has been working on diet and exercise for prediabetes, and denies foot ulcerations, hyperglycemia, hypoglycemia , increased appetite, nausea, paresthesia of the feet, polydipsia, polyuria, visual disturbances, vomiting and weight loss. Last A1C in the office was:  Lab Results  Component Value Date   HGBA1C 5.8* 03/09/2015    Patient is on Vitamin D supplement.  Lab Results  Component Value Date   VD25OH 49 03/09/2015      Current Medications:  Current Outpatient Prescriptions on File Prior to Visit  Medication Sig Dispense Refill  . atenolol (TENORMIN) 25 MG tablet Take 1/2 to 1 pill daily for blood pressure 30 tablet 1  . Calcium 1500 MG tablet Take 1,500 mg by mouth.    . cholecalciferol (VITAMIN D) 1000 UNITS tablet Take 1,000 Units  by mouth daily.    . clomiPRAMINE (ANAFRANIL) 50 MG capsule Take 50 mg by mouth at bedtime.    . clonazePAM (KLONOPIN) 1 MG tablet Take 1 mg by mouth at bedtime.    . fish oil-omega-3 fatty acids 1000 MG capsule Take 1 g by mouth daily.    . magnesium 30 MG tablet Take 30 mg by mouth 2 (two) times daily.    . mometasone (NASONEX) 50 MCG/ACT nasal spray Place 2 sprays into the nose daily. 17 g 2  . Multiple Vitamin (MULTIVITAMIN) tablet Take 1 tablet by mouth daily.    Marland Kitchen triamcinolone cream (KENALOG) 0.1 % Apply topically 4 (four) times daily. 85.2 g 99  . vitamin C (ASCORBIC ACID) 500 MG tablet Take 500 mg by mouth daily.    Marland Kitchen ZETIA 10 MG tablet Take 1 tablet by mouth  every day for cholesterol 90 tablet 1   No current facility-administered medications on file prior to visit.    Medical History:  Past Medical History  Diagnosis Date  . GERD (gastroesophageal reflux disease)   . Esophageal stricture   . Diverticulosis   . Colon polyp     hyperplastic  . Tachycardia   . Anemia     mild  . Hyperlipidemia   . Hypertension   . Prediabetes   . Palpitations   . Anxiety     Allergies:  Allergies  Allergen Reactions  . Asa [Aspirin] Diarrhea  . Levaquin [Levofloxacin] Other (See  Comments)    Joint Pain  . Macrolides And Ketolides Nausea And Vomiting  . Paxil [Paroxetine Hcl] Nausea And Vomiting  . Penicillins Hives  . Prednisone     Altered mental state  . Tetracyclines & Related Nausea And Vomiting  . Zoloft [Sertraline Hcl] Nausea And Vomiting  . Sulfa Antibiotics Rash  . Trimethoprim Rash     Review of Systems:  Review of Systems  Constitutional: Negative for fever, chills and malaise/fatigue.  Respiratory: Negative for cough, shortness of breath and wheezing.   Cardiovascular: Negative for chest pain, palpitations and leg swelling.  Gastrointestinal: Positive for constipation. Negative for heartburn, diarrhea, blood in stool and melena.  Genitourinary: Negative.    Skin: Negative.   Neurological: Negative for dizziness and loss of consciousness.  Psychiatric/Behavioral: Negative for depression. The patient is not nervous/anxious and does not have insomnia.     Family history- Review and unchanged  Social history- Review and unchanged  Physical Exam: BP 104/62 mmHg  Pulse 70  Temp(Src) 98 F (36.7 C) (Temporal)  Resp 16  Ht '5\' 7"'$  (1.702 m)  Wt 157 lb (71.215 kg)  BMI 24.58 kg/m2  LMP 12/09/1994 Wt Readings from Last 3 Encounters:  06/15/15 157 lb (71.215 kg)  03/09/15 156 lb 12.8 oz (71.124 kg)  12/07/14 163 lb (73.936 kg)    General Appearance: Well nourished well developed, in no apparent distress. Eyes: PERRLA, EOMs, conjunctiva no swelling or erythema ENT/Mouth: Ear canals normal without obstruction, swelling, erythma, discharge.  TMs normal bilaterally.  Oropharynx moist, clear, without exudate, or postoropharyngeal swelling. Neck: Supple, thyroid normal,no cervical adenopathy  Respiratory: Respiratory effort normal, Breath sounds clear A&P without rhonchi, wheeze, or rale.  No retractions, no accessory usage. Cardio: RRR with no MRGs. Brisk peripheral pulses without edema.  Abdomen: Soft, + BS,  Non tender, no guarding, rebound, hernias, masses. Musculoskeletal: Full ROM, 5/5 strength, Normal gait Skin: Warm, dry without rashes, lesions, ecchymosis.  Neuro: Awake and oriented X 3, Cranial nerves intact. Normal muscle tone, no cerebellar symptoms. Psych: Normal affect, Insight and Judgment appropriate.    Starlyn Skeans, PA-C 4:54 PM [facility]

## 2015-06-16 LAB — INSULIN, RANDOM: Insulin: 4 u[IU]/mL (ref 2.0–19.6)

## 2015-06-16 LAB — HEMOGLOBIN A1C
Hgb A1c MFr Bld: 5.7 % — ABNORMAL HIGH (ref <5.7)
MEAN PLASMA GLUCOSE: 117 mg/dL — AB (ref <117)

## 2015-06-16 LAB — TSH: TSH: 1.466 u[IU]/mL (ref 0.350–4.500)

## 2015-06-16 LAB — VITAMIN D 25 HYDROXY (VIT D DEFICIENCY, FRACTURES): Vit D, 25-Hydroxy: 47 ng/mL (ref 30–100)

## 2015-07-28 ENCOUNTER — Other Ambulatory Visit: Payer: Self-pay | Admitting: Internal Medicine

## 2015-07-28 DIAGNOSIS — I10 Essential (primary) hypertension: Secondary | ICD-10-CM

## 2015-07-28 MED ORDER — ATENOLOL 25 MG PO TABS: ORAL_TABLET | ORAL | Status: DC

## 2015-08-01 ENCOUNTER — Other Ambulatory Visit: Payer: Self-pay | Admitting: Physician Assistant

## 2015-08-01 DIAGNOSIS — I10 Essential (primary) hypertension: Secondary | ICD-10-CM

## 2015-08-01 MED ORDER — ATENOLOL 25 MG PO TABS: ORAL_TABLET | ORAL | Status: DC

## 2015-09-21 ENCOUNTER — Encounter: Payer: Self-pay | Admitting: Internal Medicine

## 2015-09-21 ENCOUNTER — Ambulatory Visit (INDEPENDENT_AMBULATORY_CARE_PROVIDER_SITE_OTHER): Payer: Medicare Other | Admitting: Internal Medicine

## 2015-09-21 VITALS — BP 96/62 | HR 72 | Temp 97.7°F | Resp 16 | Ht 66.75 in | Wt 151.2 lb

## 2015-09-21 DIAGNOSIS — I10 Essential (primary) hypertension: Secondary | ICD-10-CM | POA: Diagnosis not present

## 2015-09-21 DIAGNOSIS — Z6824 Body mass index (BMI) 24.0-24.9, adult: Secondary | ICD-10-CM | POA: Insufficient documentation

## 2015-09-21 DIAGNOSIS — E559 Vitamin D deficiency, unspecified: Secondary | ICD-10-CM

## 2015-09-21 DIAGNOSIS — E785 Hyperlipidemia, unspecified: Secondary | ICD-10-CM

## 2015-09-21 DIAGNOSIS — K219 Gastro-esophageal reflux disease without esophagitis: Secondary | ICD-10-CM | POA: Diagnosis not present

## 2015-09-21 DIAGNOSIS — Z79899 Other long term (current) drug therapy: Secondary | ICD-10-CM | POA: Diagnosis not present

## 2015-09-21 DIAGNOSIS — R7303 Prediabetes: Secondary | ICD-10-CM

## 2015-09-21 DIAGNOSIS — Z23 Encounter for immunization: Secondary | ICD-10-CM | POA: Diagnosis not present

## 2015-09-21 DIAGNOSIS — Z6823 Body mass index (BMI) 23.0-23.9, adult: Secondary | ICD-10-CM | POA: Diagnosis not present

## 2015-09-21 LAB — LIPID PANEL
CHOL/HDL RATIO: 3.1 ratio (ref <=5.0)
CHOLESTEROL: 185 mg/dL (ref 125–200)
HDL: 60 mg/dL (ref >=46)
LDL Cholesterol: 99 mg/dL (ref <130)
Triglycerides: 131 mg/dL (ref <150)
VLDL: 26 mg/dL (ref <30)

## 2015-09-21 LAB — MAGNESIUM: MAGNESIUM: 2.1 mg/dL (ref 1.5–2.5)

## 2015-09-21 LAB — CBC WITH DIFFERENTIAL/PLATELET
BASOS PCT: 0 % (ref 0–1)
Basophils Absolute: 0 10*3/uL (ref 0.0–0.1)
EOS ABS: 0.2 10*3/uL (ref 0.0–0.7)
EOS PCT: 4 % (ref 0–5)
HCT: 41.1 % (ref 36.0–46.0)
Hemoglobin: 13.7 g/dL (ref 12.0–15.0)
Lymphocytes Relative: 36 % (ref 12–46)
Lymphs Abs: 2.1 10*3/uL (ref 0.7–4.0)
MCH: 29.3 pg (ref 26.0–34.0)
MCHC: 33.3 g/dL (ref 30.0–36.0)
MCV: 88 fL (ref 78.0–100.0)
MONO ABS: 0.6 10*3/uL (ref 0.1–1.0)
MONOS PCT: 10 % (ref 3–12)
MPV: 9.2 fL (ref 8.6–12.4)
Neutro Abs: 2.9 10*3/uL (ref 1.7–7.7)
Neutrophils Relative %: 50 % (ref 43–77)
PLATELETS: 237 10*3/uL (ref 150–400)
RBC: 4.67 MIL/uL (ref 3.87–5.11)
RDW: 13.5 % (ref 11.5–15.5)
WBC: 5.8 10*3/uL (ref 4.0–10.5)

## 2015-09-21 LAB — HEPATIC FUNCTION PANEL
ALT: 13 U/L (ref 6–29)
AST: 9 U/L — ABNORMAL LOW (ref 10–35)
Albumin: 4.1 g/dL (ref 3.6–5.1)
Alkaline Phosphatase: 69 U/L (ref 33–130)
BILIRUBIN DIRECT: 0.1 mg/dL (ref <=0.2)
BILIRUBIN TOTAL: 0.5 mg/dL (ref 0.2–1.2)
Indirect Bilirubin: 0.4 mg/dL (ref 0.2–1.2)
Total Protein: 6.4 g/dL (ref 6.1–8.1)

## 2015-09-21 LAB — BASIC METABOLIC PANEL WITH GFR
BUN: 16 mg/dL (ref 7–25)
CALCIUM: 9.2 mg/dL (ref 8.6–10.4)
CO2: 25 mmol/L (ref 20–31)
CREATININE: 0.73 mg/dL (ref 0.50–0.99)
Chloride: 104 mmol/L (ref 98–110)
GFR, Est African American: >89 mL/min (ref >=60)
GFR, Est Non African American: 85 mL/min (ref >=60)
Glucose, Bld: 105 mg/dL — ABNORMAL HIGH (ref 65–99)
Potassium: 4.2 mmol/L (ref 3.5–5.3)
SODIUM: 137 mmol/L (ref 135–146)

## 2015-09-21 LAB — TSH: TSH: 1.227 u[IU]/mL (ref 0.350–4.500)

## 2015-09-21 NOTE — Progress Notes (Signed)
Patient ID: Donna Dillon, female   DOB: 27-Oct-1946, 69 y.o.   MRN: [id number]   This very nice 69 y.o. MWF presents for 6 month follow up with Hypertension, palpitations,  Hyperlipidemia, Pre-Diabetes and Vitamin D Deficiency.  Labile   Patient is treated for labile HTN and palpitations circa 2008 & BP has been controlled at home. Today's BP: 96/62 mmHg. Patient has had no complaints of any cardiac type chest pain, no recent palpitations, dyspnea/orthopnea/PND, dizziness, claudication, or dependent edema.   Hyperlipidemia is controlled with diet & meds. Patient denies myalgias or other med SE's. Last Lipids were were at goal with Cholesterol 185; HDL 60; LDL  99; & Triglycerides 131 on 09/21/2015   Also, the patient has history of PreDiabetes since 2010 with A1c 5.7% and has had no symptoms of reactive hypoglycemia, diabetic polys, paresthesias or visual blurring.  Last A1c still was 5.7% on 06/15/2015.   Further, the patient also has history of Vitamin D Deficiency of 36 in 2008 and supplements vitamin D without any suspected side-effects. Last vitamin D was 47 on 06/15/2015.  Medication Sig  . atenolol  25 MG tablet Take 1/2 to 1 pill daily for blood pressure & palpitations  . Calcium 1500 MG tablet Take 1,500 mg by mouth.  Marland Kitchen VITAMIN D 1000 UNITS Take 1,000 Units by mouth daily.  . clomiPRAMINE (ANAFRANIL) 50 MG  Take 50 mg by mouth at bedtime.  . clonazePAM  1 MG tablet Take 1 mg by mouth at bedtime.  . fish oil 1000 MG capsule Take 1 g by mouth daily.  . magnesium 30 MG tablet Take 30 mg by mouth 2 (two) times daily.  Marland Kitchen NASONEX nasal spray Place 2 sprays into the nose daily.  . Multiple Vitamin  Take 1 tablet by mouth daily.  Marland Kitchen triamcinolone crm (KENALOG) 0.1 % Apply topically 4 (four) times daily.  . vitamin C  500 MG tablet Take 500 mg by mouth daily.  Marland Kitchen ZETIA 10 MG tablet Take 1 tablet by mouth  every day for cholesterol   Allergies  Allergen Reactions  . Asa [Aspirin] Diarrhea  .  Levaquin [Levofloxacin] Other (See Comments)    Joint Pain  . Macrolides And Ketolides Nausea And Vomiting  . Paxil [Paroxetine Hcl] Nausea And Vomiting  . Penicillins Hives  . Prednisone     Altered mental state  . Tetracyclines & Related Nausea And Vomiting  . Zoloft [Sertraline Hcl] Nausea And Vomiting  . Sulfa Antibiotics Rash  . Trimethoprim Rash   PMHx:   Past Medical History  Diagnosis Date  . GERD (gastroesophageal reflux disease)   . Esophageal stricture   . Diverticulosis   . Colon polyp     hyperplastic  . Tachycardia   . Anemia     mild  . Hyperlipidemia   . Hypertension   . Prediabetes   . Palpitations   . Anxiety    Immunization History  Administered Date(s) Administered  . Influenza, High Dose Seasonal PF 11/14/2014  . Pneumococcal Conjugate-13 09/21/2015  . Pneumococcal Polysaccharide-23 02/14/2009  . Td 12/11/2006  . Tdap 12/09/2005  . Zoster 02/27/2011   Past Surgical History  Procedure Laterality Date  . Tonsillectomy    . Breast biopsy Left   . Cesarean section      x1   FHx:    Reviewed / unchanged  SHx:    Reviewed / unchanged  Systems Review:  Constitutional: Denies fever, chills, wt changes, headaches, insomnia, fatigue, night sweats,  change in appetite. Eyes: Denies redness, blurred vision, diplopia, discharge, itchy, watery eyes.  ENT: Denies discharge, congestion, post nasal drip, epistaxis, sore throat, earache, hearing loss, dental pain, tinnitus, vertigo, sinus pain, snoring.  CV: Denies chest pain, palpitations, irregular heartbeat, syncope, dyspnea, diaphoresis, orthopnea, PND, claudication or edema. Respiratory: denies cough, dyspnea, DOE, pleurisy, hoarseness, laryngitis, wheezing.  Gastrointestinal: Denies dysphagia, odynophagia, heartburn, reflux, water brash, abdominal pain or cramps, nausea, vomiting, bloating, diarrhea, constipation, hematemesis, melena, hematochezia  or hemorrhoids. Genitourinary: Denies dysuria,  frequency, urgency, nocturia, hesitancy, discharge, hematuria or flank pain. Musculoskeletal: Denies arthralgias, myalgias, stiffness, jt. swelling, pain, limping or strain/sprain.  Skin: Denies pruritus, rash, hives, warts, acne, eczema or change in skin lesion(s). Neuro: No weakness, tremor, incoordination, spasms, paresthesia or pain. Psychiatric: Denies confusion, memory loss or sensory loss. Endo: Denies change in weight, skin or hair change.  Heme/Lymph: No excessive bleeding, bruising or enlarged lymph nodes.  Physical Exam  BP 96/62   Pulse 72  Temp 97.7 F   Resp 16  Ht 5' 6.75"   Wt 151 lb     BMI 23.87  Appears well nourished and in no distress. Eyes: PERRLA, EOMs, conjunctiva no swelling or erythema. Sinuses: No frontal/maxillary tenderness ENT/Mouth: EAC's clear, TM's nl w/o erythema, bulging. Nares clear w/o erythema, swelling, exudates. Oropharynx clear without erythema or exudates. Oral hygiene is good. Tongue normal, non obstructing. Hearing intact.  Neck: Supple. Thyroid nl. Car 2+/2+ without bruits, nodes or JVD. Chest: Respirations nl with BS clear & equal w/o rales, rhonchi, wheezing or stridor.  Cor: Heart sounds normal w/ regular rate and rhythm without sig. murmurs, gallops, clicks, or rubs. Peripheral pulses normal and equal  without edema.  Abdomen: Soft & bowel sounds normal. Non-tender w/o guarding, rebound, hernias, masses, or organomegaly.  Lymphatics: Unremarkable.  Musculoskeletal: Full ROM all peripheral extremities, joint stability, 5/5 strength, and normal gait.  Skin: Warm, dry without exposed rashes, lesions or ecchymosis apparent.  Neuro: Cranial nerves intact, reflexes equal bilaterally. Sensory-motor testing grossly intact. Tendon reflexes grossly intact.  Pysch: Alert & oriented x 3.  Insight and judgement nl & appropriate. No ideations.  Assessment and Plan:  1. Essential hypertension  - TSH  2. Hyperlipidemia  - Lipid panel  3.  Prediabetes  - Hemoglobin A1c - Insulin, random  4. Vitamin D deficiency  - Vit D  25 hydroxy   5. Gastroesophageal reflux disease   6. Medication management  - CBC with Differential/Platelet - BASIC METABOLIC PANEL WITH GFR - Hepatic function panel - Magnesium  7. Need for prophylactic vaccination against Streptococcus pneumoniae (pneumococcus)  - Pneumococcal conjugate vaccine 13-valent   Recommended regular exercise, BP monitoring, weight control, and discussed med and SE's. Recommended labs to assess and monitor clinical status. Further disposition pending results of labs. Over 30 minutes of exam, counseling, chart review was performed

## 2015-09-21 NOTE — Patient Instructions (Signed)
Recommend Adult Low Dose Aspirin or   coated  Aspirin 81 mg daily   To reduce risk of Colon Cancer 20 %,   Skin Cancer 26 % ,   Melanoma 46%   and   Pancreatic cancer 60%   ++++++++++++++++++++++++++++++++++++++++++++++++++++++  Vitamin D goal   is between 70-100.   Please make sure that you are taking your Vitamin D as directed.   It is very important as a natural anti-inflammatory   helping hair, skin, and nails, as well as reducing stroke and heart attack risk.   It helps your bones and helps with mood.  It also decreases numerous cancer risks so please take it as directed.   Low Vit D is associated with a 200-300% higher risk for CANCER   and 200-300% higher risk for HEART   ATTACK  &  STROKE.   ......................................  It is also associated with higher death rate at younger ages,   autoimmune diseases like Rheumatoid arthritis, Lupus, Multiple Sclerosis.     Also many other serious conditions, like depression, Alzheimer's  Dementia, infertility, muscle aches, fatigue, fibromyalgia - just to name a few.  ++++++++++++++++++++++++++++++++++++++++++++++++  Recommend the book "The END of DIETING" by Dr Joel Fuhrman   & the book "The END of DIABETES " by Dr Joel Fuhrman  At [employer] - get book & Audio CD's     Being diabetic has a  300% increased risk for heart attack, stroke, cancer, and alzheimer- type vascular dementia. It is very important that you work harder with diet by avoiding all foods that are white. Avoid white rice (brown & wild rice is OK), white potatoes (sweetpotatoes in moderation is OK), White bread or wheat bread or anything made out of white flour like bagels, donuts, rolls, buns, biscuits, cakes, pastries, cookies, pizza crust, and pasta (made from white flour & egg whites) - vegetarian pasta or spinach or wheat pasta is OK. Multigrain breads like Arnold's or Pepperidge Farm, or multigrain sandwich thins or flatbreads.  Diet,  exercise and weight loss can reverse and cure diabetes in the early stages.  Diet, exercise and weight loss is very important in the control and prevention of complications of diabetes which affects every system in your body, ie. Brain - dementia/stroke, eyes - glaucoma/blindness, heart - heart attack/heart failure, kidneys - dialysis, stomach - gastric paralysis, intestines - malabsorption, nerves - severe painful neuritis, circulation - gangrene & loss of a leg(s), and finally cancer and Alzheimers.    I recommend avoid fried & greasy foods,  sweets/candy, white rice (brown or wild rice or Quinoa is OK), white potatoes (sweet potatoes are OK) - anything made from white flour - bagels, doughnuts, rolls, buns, biscuits,white and wheat breads, pizza crust and traditional pasta made of white flour & egg white(vegetarian pasta or spinach or wheat pasta is OK).  Multi-grain bread is OK - like multi-grain flat bread or sandwich thins. Avoid alcohol in excess. Exercise is also important.    Eat all the vegetables you want - avoid meat, especially red meat and dairy - especially cheese.  Cheese is the most concentrated form of trans-fats which is the worst thing to clog up our arteries. Veggie cheese is OK which can be found in the fresh produce section at [employer] or [employer] or [employer]  ++++++++++++++++++++++++++++++++++++++++++++++++++ DASH Eating Plan  DASH stands for "Dietary Approaches to Stop Hypertension."   The DASH eating plan is a healthy eating plan that has been shown to reduce high   blood pressure (hypertension). Additional health benefits Kunz include reducing the risk of type 2 diabetes mellitus, heart disease, and stroke. The DASH eating plan Poet also help with weight loss.  WHAT DO I NEED TO KNOW ABOUT THE DASH EATING PLAN? For the DASH eating plan, you will follow these general guidelines:  Choose foods with a percent daily value for sodium of less than 5% (as listed on the food  label).  Use salt-free seasonings or herbs instead of table salt or sea salt.  Check with your health care provider or pharmacist before using salt substitutes.  Eat lower-sodium products, often labeled as "lower sodium" or "no salt added."  Eat fresh foods.  Eat more vegetables, fruits, and low-fat dairy products.    Choose whole grains. Look for the word "whole" as the first word in the ingredient list.  Choose fish   Limit sweets, desserts, sugars, and sugary drinks.  Choose heart-healthy fats.  Eat veggie cheese   Eat more home-cooked food and less restaurant, buffet, and fast food.  Limit fried foods.  Cook foods using methods other than frying.  Limit canned vegetables. If you do use them, rinse them well to decrease the sodium.  When eating at a restaurant, ask that your food be prepared with less salt, or no salt if possible.                      WHAT FOODS CAN I EAT?  Seek help from a dietitian for individual calorie needs. Grains Whole grain or whole wheat bread. Brown rice. Whole grain or whole wheat pasta. Quinoa, bulgur, and whole grain cereals. Low-sodium cereals. Corn or whole wheat flour tortillas. Whole grain cornbread. Whole grain crackers. Low-sodium crackers.  Vegetables Fresh or frozen vegetables (raw, steamed, roasted, or grilled). Low-sodium or reduced-sodium tomato and vegetable juices. Low-sodium or reduced-sodium tomato sauce and paste. Low-sodium or reduced-sodium canned vegetables.   Fruits All fresh, canned (in natural juice), or frozen fruits.  Meat and Other Protein Products  All fish and seafood.  Dried beans, peas, or lentils. Unsalted nuts and seeds. Unsalted canned beans. Dairy Low-fat dairy products, such as skim or 1% milk, 2% or reduced-fat cheeses, low-fat ricotta or cottage cheese, or plain low-fat yogurt. Low-sodium or reduced-sodium cheeses.  Fats and Oils Tub margarines without trans fats. Light or reduced-fat mayonnaise  and salad dressings (reduced sodium). Avocado. Safflower, olive, or canola oils. Natural peanut or almond butter.  Other Unsalted popcorn and pretzels. The items listed above Thueson not be a complete list of recommended foods or beverages. Contact your dietitian for more options.  +++++++++++++++++++++++++++++++++++++++++++  WHAT FOODS ARE NOT RECOMMENDED?  Grains/ White flour or wheat flour  White bread. White pasta. White rice. Refined cornbread. Bagels and croissants. Crackers that contain trans fat.  Vegetables  Creamed or fried vegetables. Vegetables in a . Regular canned vegetables. Regular canned tomato sauce and paste. Regular tomato and vegetable juices.  Fruits Dried fruits. Canned fruit in light or heavy syrup. Fruit juice.  Meat and Other Protein Products Meat in general. Fatty cuts of meat. Ribs, chicken wings, bacon, sausage, bologna, salami, chitterlings, fatback, hot dogs, bratwurst, and packaged luncheon meats. Salted nuts and seeds. Canned beans with salt.  Dairy Whole or 2% milk, cream, half-and-half, and cream cheese. Whole-fat or sweetened yogurt. Full-fat cheeses or blue cheese. Nondairy creamers and whipped toppings. Processed cheese, cheese spreads, or cheese curds.  Condiments Onion and garlic salt, seasoned salt, table salt, and sea  salt. Canned and packaged gravies. Worcestershire sauce. Tartar sauce. Barbecue sauce. Teriyaki sauce. Soy sauce, including reduced sodium. Steak sauce. Fish sauce. Oyster sauce. Cocktail sauce. Horseradish. Ketchup and mustard. Meat flavorings and tenderizers. Bouillon cubes. Hot sauce. Tabasco sauce. Marinades. Taco seasonings. Relishes.  Fats and Oils Butter, stick margarine, lard, shortening, ghee, and bacon fat. Coconut, palm kernel, or palm oils. Regular salad dressings.  Pickles and olives. Salted popcorn and pretzels. The items listed above may not be a complete list of foods and beverages to avoid.

## 2015-09-22 LAB — HEMOGLOBIN A1C
HEMOGLOBIN A1C: 5.6 % (ref <5.7)
MEAN PLASMA GLUCOSE: 114 mg/dL (ref <117)

## 2015-09-22 LAB — INSULIN, RANDOM: INSULIN: 8.1 u[IU]/mL (ref 2.0–19.6)

## 2015-09-22 LAB — VITAMIN D 25 HYDROXY (VIT D DEFICIENCY, FRACTURES): VIT D 25 HYDROXY: 51 ng/mL (ref 30–100)

## 2015-12-10 HISTORY — PX: COLONOSCOPY: SHX174

## 2015-12-28 ENCOUNTER — Ambulatory Visit (INDEPENDENT_AMBULATORY_CARE_PROVIDER_SITE_OTHER): Payer: Medicare Other | Admitting: Physician Assistant

## 2015-12-28 ENCOUNTER — Encounter: Payer: Self-pay | Admitting: Physician Assistant

## 2015-12-28 ENCOUNTER — Other Ambulatory Visit: Payer: Self-pay

## 2015-12-28 VITALS — BP 100/60 | HR 83 | Temp 97.0°F | Resp 16 | Ht 66.75 in | Wt 152.0 lb

## 2015-12-28 DIAGNOSIS — E785 Hyperlipidemia, unspecified: Secondary | ICD-10-CM

## 2015-12-28 DIAGNOSIS — Z86010 Personal history of colonic polyps: Secondary | ICD-10-CM

## 2015-12-28 DIAGNOSIS — R6889 Other general symptoms and signs: Secondary | ICD-10-CM | POA: Diagnosis not present

## 2015-12-28 DIAGNOSIS — R002 Palpitations: Secondary | ICD-10-CM | POA: Diagnosis not present

## 2015-12-28 DIAGNOSIS — F419 Anxiety disorder, unspecified: Secondary | ICD-10-CM | POA: Diagnosis not present

## 2015-12-28 DIAGNOSIS — M8580 Other specified disorders of bone density and structure, unspecified site: Secondary | ICD-10-CM | POA: Diagnosis not present

## 2015-12-28 DIAGNOSIS — I10 Essential (primary) hypertension: Secondary | ICD-10-CM | POA: Diagnosis not present

## 2015-12-28 DIAGNOSIS — Z79899 Other long term (current) drug therapy: Secondary | ICD-10-CM | POA: Diagnosis not present

## 2015-12-28 DIAGNOSIS — Z0001 Encounter for general adult medical examination with abnormal findings: Secondary | ICD-10-CM | POA: Diagnosis not present

## 2015-12-28 DIAGNOSIS — R7303 Prediabetes: Secondary | ICD-10-CM | POA: Diagnosis not present

## 2015-12-28 DIAGNOSIS — Z0000 Encounter for general adult medical examination without abnormal findings: Secondary | ICD-10-CM

## 2015-12-28 DIAGNOSIS — E559 Vitamin D deficiency, unspecified: Secondary | ICD-10-CM | POA: Diagnosis not present

## 2015-12-28 DIAGNOSIS — K222 Esophageal obstruction: Secondary | ICD-10-CM

## 2015-12-28 DIAGNOSIS — K219 Gastro-esophageal reflux disease without esophagitis: Secondary | ICD-10-CM

## 2015-12-28 LAB — CBC WITH DIFFERENTIAL/PLATELET
BASOS PCT: 1 % (ref 0–1)
Basophils Absolute: 0.1 10*3/uL (ref 0.0–0.1)
EOS ABS: 0.2 10*3/uL (ref 0.0–0.7)
EOS PCT: 3 % (ref 0–5)
HCT: 39.7 % (ref 36.0–46.0)
HEMOGLOBIN: 13.4 g/dL (ref 12.0–15.0)
Lymphocytes Relative: 40 % (ref 12–46)
Lymphs Abs: 2.6 10*3/uL (ref 0.7–4.0)
MCH: 29.5 pg (ref 26.0–34.0)
MCHC: 33.8 g/dL (ref 30.0–36.0)
MCV: 87.3 fL (ref 78.0–100.0)
MONO ABS: 0.7 10*3/uL (ref 0.1–1.0)
MPV: 9.3 fL (ref 8.6–12.4)
Monocytes Relative: 10 % (ref 3–12)
NEUTROS ABS: 3 10*3/uL (ref 1.7–7.7)
Neutrophils Relative %: 46 % (ref 43–77)
PLATELETS: 224 10*3/uL (ref 150–400)
RBC: 4.55 MIL/uL (ref 3.87–5.11)
RDW: 13.5 % (ref 11.5–15.5)
WBC: 6.6 10*3/uL (ref 4.0–10.5)

## 2015-12-28 NOTE — Patient Instructions (Signed)
Benefiber is good for constipation/diarrhea/irritable bowel syndrome, it helps with weight loss and can help lower your bad cholesterol. Please do 1-2 TBSP in the morning in water, coffee, or tea. It can take up to a month before you can see a difference with your bowel movements. It is cheapest from [employer], [employer], [employer].    10 Tips on Belching, Bloating, and Flatulence 1. Belching is caused by swallowed air from:  Eating or drinking too fast  Poorly fitting dentures; not chewing food completely  Carbonated beverages  Chewing gum or sucking on hard candies  Excessive swallowing due to nervous tension or postnasal drip  Forced belching to relieve abdominal discomfort 2. To prevent excessive belching, avoid:  Carbonated beverages  Chewing gum  Hard candies  Simethicone/GasX may be helpful  3. Abdominal bloating and discomfort may be due to intestinal sensitivity or symptoms of irritable bowel syndrome. To relieve symptoms, avoid:  Broccoli  Baked beans  Cabbage  Carbonated drinks  Cauliflower  Chewing gum  Hard candy 4. Abdominal distention resulting from weak abdominal muscles:  Is better in the morning  Gets worse as the day progresses  Is relieved by lying down 5. To prevent Abdominal distention:  Tighten abdominal muscles by pulling in your stomach several times during the day  Do sit-up exercises if possible  Wear an abdominal support garment if exercise is too difficult 6. Flatulence is gas created through bacterial action in the bowel and passed rectally. Keep in mind that:  10-18 passages per day are normal  Primary gases are harmless and odorless  Noticeable smells are trace gases related to food intake 7. Foods that are likely to form gas include:  Milk, dairy products, and medications that contain lactose--If your body doesn't produce the enzyme (lactase) to break it down.  Certain vegetables--baked beans, cauliflower, broccoli, cabbage  Certain starches--wheat,  oats, corn, potatoes. Rice is a good substitute. 8. Identify offending foods. Reduce or eliminate these gas-forming foods from your diet. 9.   Preventive Care for Adults A healthy lifestyle and preventive care can promote health and wellness. Preventive health guidelines for women include the following key practices.  A routine yearly physical is a good way to check with your health care provider about your health and preventive screening. It is a chance to share any concerns and updates on your health and to receive a thorough exam.  Visit your dentist for a routine exam and preventive care every 6 months. Brush your teeth twice a day and floss once a day. Good oral hygiene prevents tooth decay and gum disease.  The frequency of eye exams is based on your age, health, family medical history, use of contact lenses, and other factors. Follow your health care provider's recommendations for frequency of eye exams.  Eat a healthy diet. Foods like vegetables, fruits, whole grains, low-fat dairy products, and lean protein foods contain the nutrients you need without too many calories. Decrease your intake of foods high in solid fats, added sugars, and salt. Eat the right amount of calories for you.Get information about a proper diet from your health care provider, if necessary.  Regular physical exercise is one of the most important things you can do for your health. Most adults should get at least 150 minutes of moderate-intensity exercise (any activity that increases your heart rate and causes you to sweat) each week. In addition, most adults need muscle-strengthening exercises on 2 or more days a week.  Maintain a healthy weight. The body  mass index (BMI) is a screening tool to identify possible weight problems. It provides an estimate of body fat based on height and weight. Your health care provider can find your BMI and can help you achieve or maintain a healthy weight.For adults 20 years and  older:  A BMI below 18.5 is considered underweight.  A BMI of 18.5 to 24.9 is normal.  A BMI of 25 to 29.9 is considered overweight.  A BMI of 30 and above is considered obese.  Maintain normal blood lipids and cholesterol levels by exercising and minimizing your intake of saturated fat. Eat a balanced diet with plenty of fruit and vegetables. If your lipid or cholesterol levels are high, you are over 50, or you are at high risk for heart disease, you may need your cholesterol levels checked more frequently.Ongoing high lipid and cholesterol levels should be treated with medicines if diet and exercise are not working.  If you smoke, find out from your health care provider how to quit. If you do not use tobacco, do not start.  Lung cancer screening is recommended for adults aged 11-80 years who are at high risk for developing lung cancer because of a history of smoking. A yearly low-dose CT scan of the lungs is recommended for people who have at least a 30-pack-year history of smoking and are a current smoker or have quit within the past 15 years. A pack year of smoking is smoking an average of 1 pack of cigarettes a day for 1 year (for example: 1 pack a day for 30 years or 2 packs a day for 15 years). Yearly screening should continue until the smoker has stopped smoking for at least 15 years. Yearly screening should be stopped for people who develop a health problem that would prevent them from having lung cancer treatment.  Avoid use of street drugs. Do not share needles with anyone. Ask for help if you need support or instructions about stopping the use of drugs.  High blood pressure causes heart disease and increases the risk of stroke.  Ongoing high blood pressure should be treated with medicines if weight loss and exercise do not work.  If you are 3-81 years old, ask your health care provider if you should take aspirin to prevent strokes.  Diabetes screening involves taking a blood  sample to check your fasting blood sugar level. This should be done once every 3 years, after age 72, if you are within normal weight and without risk factors for diabetes. Testing should be considered at a younger age or be carried out more frequently if you are overweight and have at least 1 risk factor for diabetes.  Breast cancer screening is essential preventive care for women. You should practice "breast self-awareness." This means understanding the normal appearance and feel of your breasts and may include breast self-examination. Any changes detected, no matter how small, should be reported to a health care provider. Women in their 80s and 30s should have a clinical breast exam (CBE) by a health care provider as part of a regular health exam every 1 to 3 years. After age 71, women should have a CBE every year. Starting at age 71, women should consider having a mammogram (breast X-ray test) every year. Women who have a family history of breast cancer should talk to their health care provider about genetic screening. Women at a high risk of breast cancer should talk to their health care providers about having an MRI and a mammogram  every year.  Breast cancer gene (BRCA)-related cancer risk assessment is recommended for women who have family members with BRCA-related cancers. BRCA-related cancers include breast, ovarian, tubal, and peritoneal cancers. Having family members with these cancers may be associated with an increased risk for harmful changes (mutations) in the breast cancer genes BRCA1 and BRCA2. Results of the assessment will determine the need for genetic counseling and BRCA1 and BRCA2 testing.  Routine pelvic exams to screen for cancer are no longer recommended for nonpregnant women who are considered low risk for cancer of the pelvic organs (ovaries, uterus, and vagina) and who do not have symptoms. Ask your health care provider if a screening pelvic exam is right for you.  If you have had  past treatment for cervical cancer or a condition that could lead to cancer, you need Pap tests and screening for cancer for at least 20 years after your treatment. If Pap tests have been discontinued, your risk factors (such as having a new sexual partner) need to be reassessed to determine if screening should be resumed. Some women have medical problems that increase the chance of getting cervical cancer. In these cases, your health care provider may recommend more frequent screening and Pap tests.    Colorectal cancer can be detected and often prevented. Most routine colorectal cancer screening begins at the age of 76 years and continues through age 19 years. However, your health care provider may recommend screening at an earlier age if you have risk factors for colon cancer. On a yearly basis, your health care provider may provide home test kits to check for hidden blood in the stool. Use of a small camera at the end of a tube, to directly examine the colon (sigmoidoscopy or colonoscopy), can detect the earliest forms of colorectal cancer. Talk to your health care provider about this at age 62, when routine screening begins. Direct exam of the colon should be repeated every 5-10 years through age 14 years, unless early forms of pre-cancerous polyps or small growths are found.  Osteoporosis is a disease in which the bones lose minerals and strength with aging. This can result in serious bone fractures or breaks. The risk of osteoporosis can be identified using a bone density scan. Women ages 7 years and over and women at risk for fractures or osteoporosis should discuss screening with their health care providers. Ask your health care provider whether you should take a calcium supplement or vitamin D to reduce the rate of osteoporosis.  Menopause can be associated with physical symptoms and risks. Hormone replacement therapy is available to decrease symptoms and risks. You should talk to your health  care provider about whether hormone replacement therapy is right for you.  Use sunscreen. Apply sunscreen liberally and repeatedly throughout the day. You should seek shade when your shadow is shorter than you. Protect yourself by wearing long sleeves, pants, a wide-brimmed hat, and sunglasses year round, whenever you are outdoors.  Once a month, do a whole body skin exam, using a mirror to look at the skin on your back. Tell your health care provider of new moles, moles that have irregular borders, moles that are larger than a pencil eraser, or moles that have changed in shape or color.  Stay current with required vaccines (immunizations).  Influenza vaccine. All adults should be immunized every year.  Tetanus, diphtheria, and acellular pertussis (Td, Tdap) vaccine. Pregnant women should receive 1 dose of Tdap vaccine during each pregnancy. The dose should be obtained  regardless of the length of time since the last dose. Immunization is preferred during the 27th-36th week of gestation. An adult who has not previously received Tdap or who does not know her vaccine status should receive 1 dose of Tdap. This initial dose should be followed by tetanus and diphtheria toxoids (Td) booster doses every 10 years. Adults with an unknown or incomplete history of completing a 3-dose immunization series with Td-containing vaccines should begin or complete a primary immunization series including a Tdap dose. Adults should receive a Td booster every 10 years.    Zoster vaccine. One dose is recommended for adults aged 21 years or older unless certain conditions are present.    Pneumococcal 13-valent conjugate (PCV13) vaccine. When indicated, a person who is uncertain of her immunization history and has no record of immunization should receive the PCV13 vaccine. An adult aged 22 years or older who has certain medical conditions and has not been previously immunized should receive 1 dose of PCV13 vaccine. This  PCV13 should be followed with a dose of pneumococcal polysaccharide (PPSV23) vaccine. The PPSV23 vaccine dose should be obtained at least 8 weeks after the dose of PCV13 vaccine. An adult aged 51 years or older who has certain medical conditions and previously received 1 or more doses of PPSV23 vaccine should receive 1 dose of PCV13. The PCV13 vaccine dose should be obtained 1 or more years after the last PPSV23 vaccine dose.    Pneumococcal polysaccharide (PPSV23) vaccine. When PCV13 is also indicated, PCV13 should be obtained first. All adults aged 48 years and older should be immunized. An adult younger than age 1 years who has certain medical conditions should be immunized. Any person who resides in a nursing home or long-term care facility should be immunized. An adult smoker should be immunized. People with an immunocompromised condition and certain other conditions should receive both PCV13 and PPSV23 vaccines. People with human immunodeficiency virus (HIV) infection should be immunized as soon as possible after diagnosis. Immunization during chemotherapy or radiation therapy should be avoided. Routine use of PPSV23 vaccine is not recommended for American Indians, [place] Natives, or people younger than 65 years unless there are medical conditions that require PPSV23 vaccine. When indicated, people who have unknown immunization and have no record of immunization should receive PPSV23 vaccine. One-time revaccination 5 years after the first dose of PPSV23 is recommended for people aged 19-64 years who have chronic kidney failure, nephrotic syndrome, asplenia, or immunocompromised conditions. People who received 1-2 doses of PPSV23 before age 71 years should receive another dose of PPSV23 vaccine at age 29 years or later if at least 5 years have passed since the previous dose. Doses of PPSV23 are not needed for people immunized with PPSV23 at or after age 84 years.   Preventive Services /  Frequency  Ages 27 years and over  Blood pressure check.  Lipid and cholesterol check.  Lung cancer screening. / Every year if you are aged 41-80 years and have a 30-pack-year history of smoking and currently smoke or have quit within the past 15 years. Yearly screening is stopped once you have quit smoking for at least 15 years or develop a health problem that would prevent you from having lung cancer treatment.  Clinical breast exam.** / Every year after age 63 years.  BRCA-related cancer risk assessment.** / For women who have family members with a BRCA-related cancer (breast, ovarian, tubal, or peritoneal cancers).  Mammogram.** / Every year beginning at age 59 years  and continuing for as long as you are in good health. Consult with your health care provider.  Pap test.** / Every 3 years starting at age 44 years through age 77 or 23 years with 3 consecutive normal Pap tests. Testing can be stopped between 65 and 70 years with 3 consecutive normal Pap tests and no abnormal Pap or HPV tests in the past 10 years.  Fecal occult blood test (FOBT) of stool. / Every year beginning at age 66 years and continuing until age 53 years. You may not need to do this test if you get a colonoscopy every 10 years.  Flexible sigmoidoscopy or colonoscopy.** / Every 5 years for a flexible sigmoidoscopy or every 10 years for a colonoscopy beginning at age 58 years and continuing until age 21 years.  Hepatitis C blood test.** / For all people born from [birth year] through 1964 and any individual with known risks for hepatitis C.  Osteoporosis screening.** / A one-time screening for women ages 19 years and over and women at risk for fractures or osteoporosis.  Skin self-exam. / Monthly.  Influenza vaccine. / Every year.  Tetanus, diphtheria, and acellular pertussis (Tdap/Td) vaccine.** / 1 dose of Td every 10 years.  Zoster vaccine.** / 1 dose for adults aged 67 years or older.  Pneumococcal 13-valent  conjugate (PCV13) vaccine.** / Consult your health care provider.  Pneumococcal polysaccharide (PPSV23) vaccine.** / 1 dose for all adults aged 42 years and older. Screening for abdominal aortic aneurysm (AAA)  by ultrasound is recommended for people who have history of high blood pressure or who are current or former smokers.

## 2015-12-28 NOTE — Progress Notes (Signed)
MEDICARE ANNUAL WELLNESS VISIT AND FOLLOW UP Assessment:   1. Essential hypertension - continue medications, DASH diet, exercise and monitor at home. Call if greater than 130/80.  - CBC with Differential/Platelet - BASIC METABOLIC PANEL WITH GFR - Hepatic function panel - TSH  2. Gastroesophageal reflux disease, esophagitis presence not specified Continue PPI/H2 blocker, diet discussed  3. Prediabetes - Hemoglobin A1c  4. Hyperlipidemia - Lipid panel  5. Medication management - Magnesium  6. Vitamin D deficiency - VITAMIN D 25 Hydroxy (Vit-D Deficiency, Fractures)  7. Osteopenia Get DEXA  8. ESOPHAGEAL STRICTURE Continue PPI/H2 blocker, diet discussed  9. PERSONAL HX COLONIC POLYPS Due 2020  10. palpitations Continue atenolol  Over 30 minutes of exam, counseling, chart review, and critical decision making was performed Future Appointments Date Time Provider [place]  04/03/2016 3:00 PM Unk Pinto, MD [facility] None    Plan:   During the course of the visit the patient was educated and counseled about appropriate screening and preventive services including:    Pneumococcal vaccine   Influenza vaccine  Prevnar 13  Td vaccine  Screening electrocardiogram  Colorectal cancer screening  Diabetes screening  Glaucoma screening  Nutrition counseling   Conditions/risks identified: BMI: Discussed weight loss, diet, and increase physical activity.  Increase physical activity: AHA recommends 150 minutes of physical activity a week.  Medications reviewed Diabetes is at goal, ACE/ARB therapy: No, Reason not on Ace Inhibitor/ARB therapy:  preDM Urinary Incontinence is not an issue: discussed non pharmacology and pharmacology options.  Fall risk: low- discussed PT, home fall assessment, medications.    Subjective:  Donna Dillon is a 70 y.o. female who presents for Medicare Annual Wellness Visit and 3 month follow up for HTN, hyperlipidemia,  prediabetes, and vitamin D Def.  Date of last medicare wellness visit was 02/2015   Her blood pressure has been controlled at home, today their BP is BP: 100/60 mmHg She does workout, walking treadmill 30 mins a day. She denies chest pain, shortness of breath, dizziness.  She is on cholesterol medication, on zetia, and denies myalgias. Her cholesterol is at goal. The cholesterol last visit was:   Lab Results  Component Value Date   CHOL 185 09/21/2015   HDL 60 09/21/2015   [place] 99 09/21/2015   TRIG 131 09/21/2015   CHOLHDL 3.1 09/21/2015   She has been working on diet and exercise for prediabetes, and denies paresthesia of the feet, polydipsia, polyuria and visual disturbances. Last A1C in the office was:  Lab Results  Component Value Date   HGBA1C 5.6 09/21/2015   Patient is on Vitamin D supplement.   Lab Results  Component Value Date   VD25OH 51 09/21/2015   She uses klonopin for sleep at night.   Medication Review: Current Outpatient Prescriptions on File Prior to Visit  Medication Sig Dispense Refill  . atenolol (TENORMIN) 25 MG tablet Take 1/2 to 1 pill daily for blood pressure 90 tablet 1  . Calcium 1500 MG tablet Take 1,500 mg by mouth.    . cholecalciferol (VITAMIN D) 1000 UNITS tablet Take 1,000 Units by mouth daily.    . clomiPRAMINE (ANAFRANIL) 50 MG capsule Take 50 mg by mouth at bedtime.    . clonazePAM (KLONOPIN) 1 MG tablet Take 1 mg by mouth at bedtime.    . fish oil-omega-3 fatty acids 1000 MG capsule Take 1 g by mouth daily.    . magnesium 30 MG tablet Take 30 mg by mouth 2 (two) times  daily.    . Multiple Vitamin (MULTIVITAMIN) tablet Take 1 tablet by mouth daily.    Marland Kitchen triamcinolone cream (KENALOG) 0.1 % Apply topically 4 (four) times daily. 85.2 g 99  . vitamin C (ASCORBIC ACID) 500 MG tablet Take 500 mg by mouth daily.    Marland Kitchen ZETIA 10 MG tablet Take 1 tablet by mouth  every day for cholesterol 90 tablet 1  . mometasone (NASONEX) 50 MCG/ACT nasal spray  Place 2 sprays into the nose daily. 17 g 2   No current facility-administered medications on file prior to visit.    Current Problems (verified) Patient Active Problem List   Diagnosis Date Noted  . Medication management 11/10/2014  . Vitamin D deficiency 03/08/2014  . Hyperlipidemia   . Hypertension   . Prediabetes   . Palpitations   . Anxiety   . Osteopenia 02/25/2013  . ESOPHAGEAL STRICTURE 06/27/2010  . GERD 06/28/2009  . PERSONAL HX COLONIC POLYPS 06/28/2009    Screening Tests Immunization History  Administered Date(s) Administered  . Influenza, High Dose Seasonal PF 11/14/2014  . Pneumococcal Conjugate-13 09/21/2015  . Pneumococcal Polysaccharide-23 02/14/2009  . Td 12/11/2006  . Tdap 12/09/2005  . Zoster 02/27/2011   Preventative care: Last colonoscopy: 2010 MGM 08/2015 DEXA 2014 Pap: 2016  Prior vaccinations: TD or Tdap: 2007  Influenza: 2016  Pneumococcal: 2010 Prevnar13: 2016 Shingles/Zostavax: 2012  Names of Other Physician/Practitioners you currently use: 1. [facility] here for primary care 2. Dr. Katy Fitch, eye doctor, last visit 2016 3. Dr. Barrie Dunker, dentist, last visit 2016 Patient Care Team: Unk Pinto, MD as PCP - General (Internal Medicine) Clent Jacks, MD as Consulting Physician (Ophthalmology) Irene Shipper, MD as Consulting Physician (Gastroenterology) Milus Banister, MD as Attending Physician (Gastroenterology) Megan Salon, MD as Consulting Physician (Gynecology)  Past Surgical History  Procedure Laterality Date  . Tonsillectomy    . Breast biopsy Left   . Cesarean section      x1   Family History  Problem Relation Age of Onset  . Kidney cancer Father   . Hypertension Mother   . Heart disease Mother    Social History  Substance Use Topics  . Smoking status: Never Smoker   . Smokeless tobacco: Never Used  . Alcohol Use: No    MEDICARE WELLNESS OBJECTIVES: Tobacco use: She does not  smoke.  Patient is not a former smoker. If yes, counseling given Alcohol Current alcohol use: none Osteoporosis: postmenopausal estrogen deficiency and dietary calcium and/or vitamin D deficiency, History of fracture in the past year: no Fall risk: Low Risk Diet: in general, a "healthy" diet   Physical activity: Current Exercise Habits:: Home exercise routine, Type of exercise: walking, Time (Minutes): 30, Frequency (Times/Week): 6, Weekly Exercise (Minutes/Week): 180, Intensity: Moderate Cardiac risk factors: Cardiac Risk Factors include: advanced age (>47mn, >>85women);female gender Depression/mood screen:   [facility] 12/28/2015  Decreased Interest 0  Down, Depressed, Hopeless 0  PHQ - 2 Score 0    ADLs:  In your present state of health, do you have any difficulty performing the following activities: 12/28/2015 03/09/2015  Hearing? N N  Vision? N N  Difficulty concentrating or making decisions? N N  Walking or climbing stairs? N N  Dressing or bathing? N N  Doing errands, shopping? N N  Preparing Food and eating ? N -  Using the Toilet? N -  In the past six months, have you accidently leaked urine? N -  Do you have problems with loss of bowel control? N -  Managing your Medications? N -  Managing your Finances? N -  Housekeeping or managing your Housekeeping? N -     Cognitive Testing  Alert? Yes  Normal Appearance?Yes  Oriented to person? Yes  Place? Yes   Time? Yes  Recall of three objects?  Yes  Can perform simple calculations? Yes  Displays appropriate judgment?Yes  Can read the correct time from a watch face?Yes  EOL planning: Does patient have an advance directive?: No Would patient like information on creating an advanced directive?: Yes - Educational materials given   Objective:   Today's Vitals   12/28/15 1619  BP: 100/60  Pulse: 83  Temp: 97 F (36.1 C)  TempSrc: Temporal  Resp: 16  Height: 5' 6.75" (1.695 m)  Weight: 152 lb (68.947 kg)   SpO2: 96%   Body mass index is 24 kg/(m^2).  General appearance: alert, no distress, WD/WN, female HEENT: normocephalic, sclerae anicteric, TMs pearly, nares patent, no discharge or erythema, pharynx normal Oral cavity: MMM, no lesions Neck: supple, no lymphadenopathy, no thyromegaly, no masses Heart: RRR, normal S1, S2, no murmurs Lungs: CTA bilaterally, no wheezes, rhonchi, or rales Abdomen: +bs, soft, non tender, non distended, no masses, no hepatomegaly, no splenomegaly Musculoskeletal: nontender, no swelling, no obvious deformity Extremities: no edema, no cyanosis, no clubbing Pulses: 2+ symmetric, upper and lower extremities, normal cap refill Neurological: alert, oriented x 3, CN2-12 intact, strength normal upper extremities and lower extremities, sensation normal throughout, DTRs 2+ throughout, no cerebellar signs, gait normal Psychiatric: normal affect, behavior normal, pleasant   Medicare Attestation I have personally reviewed: The patient's medical and social history Their use of alcohol, tobacco or illicit drugs Their current medications and supplements The patient's functional ability including ADLs,fall risks, home safety risks, cognitive, and hearing and visual impairment Diet and physical activities Evidence for depression or mood disorders  The patient's weight, height, BMI, and visual acuity have been recorded in the chart.  I have made referrals, counseling, and provided education to the patient based on review of the above and I have provided the patient with a written personalized care plan for preventive services.     Vicie Mutters, PA-C   12/28/2015

## 2015-12-29 LAB — BASIC METABOLIC PANEL WITH GFR
BUN: 14 mg/dL (ref 7–25)
CALCIUM: 9.7 mg/dL (ref 8.6–10.4)
CO2: 26 mmol/L (ref 20–31)
Chloride: 103 mmol/L (ref 98–110)
Creat: 0.7 mg/dL (ref 0.50–0.99)
GFR, EST NON AFRICAN AMERICAN: 89 mL/min (ref >=60)
Glucose, Bld: 93 mg/dL (ref 65–99)
Potassium: 4 mmol/L (ref 3.5–5.3)
SODIUM: 138 mmol/L (ref 135–146)

## 2015-12-29 LAB — LIPID PANEL
CHOL/HDL RATIO: 2.8 ratio (ref <=5.0)
CHOLESTEROL: 179 mg/dL (ref 125–200)
HDL: 64 mg/dL (ref >=46)
LDL Cholesterol: 79 mg/dL (ref <130)
Triglycerides: 182 mg/dL — ABNORMAL HIGH (ref <150)
VLDL: 36 mg/dL — ABNORMAL HIGH (ref <30)

## 2015-12-29 LAB — HEMOGLOBIN A1C
Hgb A1c MFr Bld: 5.6 % (ref <5.7)
Mean Plasma Glucose: 114 mg/dL (ref <117)

## 2015-12-29 LAB — HEPATIC FUNCTION PANEL
ALT: 15 U/L (ref 6–29)
AST: 9 U/L — AB (ref 10–35)
Albumin: 4.2 g/dL (ref 3.6–5.1)
Alkaline Phosphatase: 65 U/L (ref 33–130)
BILIRUBIN DIRECT: 0.1 mg/dL (ref <=0.2)
BILIRUBIN INDIRECT: 0.4 mg/dL (ref 0.2–1.2)
Total Bilirubin: 0.5 mg/dL (ref 0.2–1.2)
Total Protein: 6.5 g/dL (ref 6.1–8.1)

## 2015-12-29 LAB — TSH: TSH: 1.729 u[IU]/mL (ref 0.350–4.500)

## 2015-12-29 LAB — MAGNESIUM: MAGNESIUM: 2.1 mg/dL (ref 1.5–2.5)

## 2015-12-29 LAB — VITAMIN D 25 HYDROXY (VIT D DEFICIENCY, FRACTURES): Vit D, 25-Hydroxy: 47 ng/mL (ref 30–100)

## 2016-01-11 ENCOUNTER — Other Ambulatory Visit: Payer: Self-pay | Admitting: Internal Medicine

## 2016-01-11 ENCOUNTER — Other Ambulatory Visit: Payer: Self-pay | Admitting: Physician Assistant

## 2016-01-25 ENCOUNTER — Encounter: Payer: Self-pay | Admitting: Internal Medicine

## 2016-02-20 ENCOUNTER — Ambulatory Visit (AMBULATORY_SURGERY_CENTER): Payer: Self-pay | Admitting: *Deleted

## 2016-02-20 VITALS — Ht 67.0 in | Wt 146.0 lb

## 2016-02-20 DIAGNOSIS — Z86010 Personal history of colonic polyps: Secondary | ICD-10-CM

## 2016-02-20 MED ORDER — NA SULFATE-K SULFATE-MG SULF 17.5-3.13-1.6 GM/177ML PO SOLN: 1.0000 | Freq: Once | ORAL | Status: DC

## 2016-02-20 NOTE — Progress Notes (Signed)
No egg or soy allergy known to patient  No issues with past sedation with any surgeries  or procedures, no intubation problems  No diet pills per patient No home 02 use per patient  No blood thinners per patient  Pt denies issues with constipation- pt states she alternates between diarrhea and constipation- occ will go 4 days and then have a formed stool but not hard and this leads to diarrhea

## 2016-03-05 ENCOUNTER — Encounter: Payer: Self-pay | Admitting: Internal Medicine

## 2016-03-05 ENCOUNTER — Ambulatory Visit (AMBULATORY_SURGERY_CENTER): Payer: Medicare Other | Admitting: Internal Medicine

## 2016-03-05 VITALS — BP 99/62 | HR 63 | Temp 97.3°F | Resp 13 | Ht 67.0 in | Wt 146.0 lb

## 2016-03-05 DIAGNOSIS — D122 Benign neoplasm of ascending colon: Secondary | ICD-10-CM

## 2016-03-05 DIAGNOSIS — Z86010 Personal history of colonic polyps: Secondary | ICD-10-CM

## 2016-03-05 MED ORDER — SODIUM CHLORIDE 0.9 % IV SOLN: 500.0000 mL | INTRAVENOUS | Status: DC

## 2016-03-05 NOTE — Op Note (Signed)
[facility] Patient Name: Donna Dillon Procedure Date: 03/05/2016 1:22 PM MRN: [id number] Endoscopist: Docia Chuck. Henrene Pastor , MD Age: 70 Referring MD:  Date of Birth: January 01, 1946 Gender: Female Procedure:                Colonoscopy with snare polypectomy -3 Indications:              High risk colon cancer surveillance: Personal                            history of colonic polyps. Index examination                            elsewhere with 10 mm right colon polyp (pathology                            unavailable); last examination July 2010 with                            hyperplastic polyp Medicines:                Monitored Anesthesia Care Procedure:                Pre-Anesthesia Assessment:                           - Prior to the procedure, a History and Physical                            was performed, and patient medications and                            allergies were reviewed. The patient's tolerance of                            previous anesthesia was also reviewed. The risks                            and benefits of the procedure and the sedation                            options and risks were discussed with the patient.                            All questions were answered, and informed consent                            was obtained. Prior Anticoagulants: The patient has                            taken no previous anticoagulant or antiplatelet                            agents. ASA Grade Assessment: II - A patient with  mild systemic disease. After reviewing the risks                            and benefits, the patient was deemed in                            satisfactory condition to undergo the procedure.                           After obtaining informed consent, the colonoscope                            was passed under direct vision. Throughout the                            procedure, the patient's blood pressure, pulse, and                             oxygen saturations were monitored continuously. The                            Model CF-HQ190L [id number]) scope was introduced                            through the anus and advanced to the the cecum,                            identified by appendiceal orifice and ileocecal                            valve. The colonoscopy was performed without                            difficulty. The patient tolerated the procedure                            well. The quality of the bowel preparation was                            good. The bowel preparation used was SUPREP. The                            ileocecal valve, appendiceal orifice, and rectum                            were photographed. Scope In: 1:46:29 PM Scope Out: 2:03:23 PM Scope Withdrawal Time: 0 hours 13 minutes 54 seconds  Total Procedure Duration: 0 hours 16 minutes 54 seconds  Findings:      The digital rectal exam was normal.      Three sessile polyps were found in the ascending colon. The polyps were       4 to 8 mm in size. These polyps were removed with a cold snare.       Resection and retrieval were complete.  A few small-mouthed diverticula were found in the sigmoid colon.      The exam was otherwise without abnormality on direct and retroflexion       views. Complications:            No immediate complications. Estimated Blood Loss:     Estimated blood loss: none. Impression:               - Three 4 to 8 mm polyps in the ascending colon,                            removed with a cold snare. Resected and retrieved.                           - Diverticulosis in the sigmoid colon.                           - The examination was otherwise normal on direct                            and retroflexion views. Recommendation:           - Patient has a contact number available for                            emergencies. The signs and symptoms of potential                            delayed  complications were discussed with the                            patient. Return to normal activities tomorrow.                            Written discharge instructions were provided to the                            patient.                           - Resume previous diet.                           - Continue present medications.                           - Await pathology results.                           - Repeat colonoscopy in 5 years for surveillance. Procedure Code(s):        --- Professional ---                           [id number], Colonoscopy, flexible; with removal of                            tumor(s), polyp(s), or other lesion(s) by snare  technique CPT copyright 2016 [employer]. All rights reserved. Docia Chuck. Henrene Pastor, MD 03/05/2016 2:13:45 PM This report has been signed electronically. Number of Addenda: 0 Referring MD:      Unk Pinto

## 2016-03-05 NOTE — Progress Notes (Signed)
Called to room to assist during endoscopic procedure.  Patient ID and intended procedure confirmed with present staff. Received instructions for my participation in the procedure from the performing physician.  

## 2016-03-05 NOTE — Progress Notes (Signed)
Patient awakening,vss,report to rn 

## 2016-03-05 NOTE — Patient Instructions (Signed)
YOU HAD AN ENDOSCOPIC PROCEDURE TODAY AT [facility]:   Refer to the procedure report that was given to you for any specific questions about what was found during the examination.  If the procedure report does not answer your questions, please call your gastroenterologist to clarify.  If you requested that your care partner not be given the details of your procedure findings, then the procedure report has been included in a sealed envelope for you to review at your convenience later.  YOU SHOULD EXPECT: Some feelings of bloating in the abdomen. Passage of more gas than usual.  Walking can help get rid of the air that was put into your GI tract during the procedure and reduce the bloating. If you had a lower endoscopy (such as a colonoscopy or flexible sigmoidoscopy) you may notice spotting of blood in your stool or on the toilet paper. If you underwent a bowel prep for your procedure, you may not have a normal bowel movement for a few days.  Please Note:  You might notice some irritation and congestion in your nose or some drainage.  This is from the oxygen used during your procedure.  There is no need for concern and it should clear up in a day or so.  SYMPTOMS TO REPORT IMMEDIATELY:   Following lower endoscopy (colonoscopy or flexible sigmoidoscopy):  Excessive amounts of blood in the stool  Significant tenderness or worsening of abdominal pains  Swelling of the abdomen that is new, acute  Fever of 100F or higher   For urgent or emergent issues, a gastroenterologist can be reached at any hour by calling [phone].   DIET: Your first meal following the procedure should be a small meal and then it is ok to progress to your normal diet. Heavy or fried foods are harder to digest and may make you feel nauseous or bloated.  Likewise, meals heavy in dairy and vegetables can increase bloating.  Drink plenty of fluids but you should avoid alcoholic beverages for 24  hours.  ACTIVITY:  You should plan to take it easy for the rest of today and you should NOT DRIVE or use heavy machinery until tomorrow (because of the sedation medicines used during the test).    FOLLOW UP: Our staff will call the number listed on your records the next business day following your procedure to check on you and address any questions or concerns that you may have regarding the information given to you following your procedure. If we do not reach you, we will leave a message.  However, if you are feeling well and you are not experiencing any problems, there is no need to return our call.  We will assume that you have returned to your regular daily activities without incident.  If any biopsies were taken you will be contacted by phone or by letter within the next 1-3 weeks.  Please call us at [phone] if you have not heard about the biopsies in 3 weeks.    SIGNATURES/CONFIDENTIALITY: You and/or your care partner have signed paperwork which will be entered into your electronic medical record.  These signatures attest to the fact that that the information above on your After Visit Summary has been reviewed and is understood.  Full responsibility of the confidentiality of this discharge information lies with you and/or your care-partner.  Polyp, diverticulosis and high fiber diet information given.

## 2016-03-06 ENCOUNTER — Telehealth: Payer: Self-pay

## 2016-03-06 NOTE — Telephone Encounter (Signed)
Left a message at [phone] for the pt to call us back if any questions or concerns. maw

## 2016-03-11 ENCOUNTER — Encounter: Payer: Self-pay | Admitting: Internal Medicine

## 2016-04-03 ENCOUNTER — Ambulatory Visit (INDEPENDENT_AMBULATORY_CARE_PROVIDER_SITE_OTHER): Payer: Medicare Other | Admitting: Internal Medicine

## 2016-04-03 ENCOUNTER — Encounter: Payer: Self-pay | Admitting: Internal Medicine

## 2016-04-03 VITALS — BP 112/70 | HR 80 | Temp 97.5°F | Resp 16 | Ht 66.5 in | Wt 149.6 lb

## 2016-04-03 DIAGNOSIS — M8580 Other specified disorders of bone density and structure, unspecified site: Secondary | ICD-10-CM

## 2016-04-03 DIAGNOSIS — Z0001 Encounter for general adult medical examination with abnormal findings: Secondary | ICD-10-CM | POA: Diagnosis not present

## 2016-04-03 DIAGNOSIS — K219 Gastro-esophageal reflux disease without esophagitis: Secondary | ICD-10-CM | POA: Diagnosis not present

## 2016-04-03 DIAGNOSIS — R7303 Prediabetes: Secondary | ICD-10-CM | POA: Diagnosis not present

## 2016-04-03 DIAGNOSIS — I10 Essential (primary) hypertension: Secondary | ICD-10-CM

## 2016-04-03 DIAGNOSIS — E785 Hyperlipidemia, unspecified: Secondary | ICD-10-CM | POA: Diagnosis not present

## 2016-04-03 DIAGNOSIS — E559 Vitamin D deficiency, unspecified: Secondary | ICD-10-CM

## 2016-04-03 DIAGNOSIS — F329 Major depressive disorder, single episode, unspecified: Secondary | ICD-10-CM

## 2016-04-03 DIAGNOSIS — R6889 Other general symptoms and signs: Secondary | ICD-10-CM | POA: Diagnosis not present

## 2016-04-03 DIAGNOSIS — Z79899 Other long term (current) drug therapy: Secondary | ICD-10-CM

## 2016-04-03 DIAGNOSIS — F32A Depression, unspecified: Secondary | ICD-10-CM

## 2016-04-03 DIAGNOSIS — Z136 Encounter for screening for cardiovascular disorders: Secondary | ICD-10-CM

## 2016-04-03 DIAGNOSIS — Z1212 Encounter for screening for malignant neoplasm of rectum: Secondary | ICD-10-CM

## 2016-04-03 LAB — CBC WITH DIFFERENTIAL/PLATELET
BASOS PCT: 0 %
Basophils Absolute: 0 cells/uL (ref 0–200)
Eosinophils Absolute: 110 cells/uL (ref 15–500)
Eosinophils Relative: 2 %
HEMATOCRIT: 39.9 % (ref 35.0–45.0)
Hemoglobin: 13.2 g/dL (ref 11.7–15.5)
LYMPHS PCT: 36 %
Lymphs Abs: 1980 cells/uL (ref 850–3900)
MCH: 29.3 pg (ref 27.0–33.0)
MCHC: 33.1 g/dL (ref 32.0–36.0)
MCV: 88.5 fL (ref 80.0–100.0)
MONO ABS: 495 {cells}/uL (ref 200–950)
MONOS PCT: 9 %
MPV: 9.3 fL (ref 7.5–12.5)
Neutro Abs: 2915 cells/uL (ref 1500–7800)
Neutrophils Relative %: 53 %
Platelets: 220 10*3/uL (ref 140–400)
RBC: 4.51 MIL/uL (ref 3.80–5.10)
RDW: 13.4 % (ref 11.0–15.0)
WBC: 5.5 10*3/uL (ref 3.8–10.8)

## 2016-04-03 LAB — TSH: TSH: 1.51 mIU/L

## 2016-04-03 MED ORDER — CLOMIPRAMINE HCL 50 MG PO CAPS: 50.0000 mg | ORAL_CAPSULE | Freq: Every day | ORAL | Status: DC

## 2016-04-03 NOTE — Progress Notes (Signed)
Patient ID: Donna Dillon, female   DOB: 03-Aug-1946, 70 y.o.   MRN: [id number]  Annual Screening/Preventative Visit And Comprehensive Evaluation &  Examination     This very nice 70 y.o. MWF presents for a Wellness/Preventative Visit & comprehensive evaluation and management of multiple medical co-morbidities.  Patient has been followed for labile HTN, Prediabetes, Hyperlipidemia and Vitamin D Deficiency.     Patient has hx/o remote elevated BP and is followed expectantly for HTN. Patient's BP has been controlled at home and patient denies any cardiac symptoms as chest pain, palpitations, shortness of breath, dizziness or ankle swelling. Today's BP: 112/70 mmHg      Patient's hyperlipidemia is controlled with diet and medications. Patient denies myalgias or other medication SE's. Last lipids were at goal with Cholesterol 179; HDL 64; LDL 79; but sl elevated Triglycerides 182 on 12/28/2015.     Patient has prediabetes predating since 2011 with A1c 5.8%  and patient denies reactive hypoglycemic symptoms, visual blurring, diabetic polys, or paresthesias. Last A1c was back normal at goal with A1c 5.6% on 12/28/2015.     Finally, patient has history of Vitamin D Deficiency of "45" in 2008 and last Vitamin D was  47 on 12/28/2015.   Medication Sig  . atenolol  25 MG tablet Take 1/2 to 1 tablet by  mouth daily for blood  pressure  . Calcium 1500 MG Take 1,500 mg by mouth.  Marland Kitchen VITAMIN D 1000 UNITS  Take 1,000 Units by mouth daily.  . clonazePAM  1 MG  Take 1 mg by mouth at bedtime.  Marland Kitchen ezetimibe  10 MG  Take 1 tablet by mouth  every day for cholesterol  . fish oil-omega-3  1000 MG  Take 1 g by mouth daily.  . magnesium 30 MG  Take 30 mg by mouth 2 (two) times daily.  . Multiple Vitamin  Take 1 tablet by mouth daily.  Marland Kitchen EYE VITAMINS  Take 1 capsule by mouth daily. ocusight  . triamcinolone cream  0.1 % Apply topically 4 (four) times daily.  . vitamin C  500 MG Take 500 mg by mouth daily.  Marland Kitchen NASONEX  nasal spray Place 2 sprays into the nose daily.   Allergies  Allergen Reactions  . Asa [Aspirin] Diarrhea  . Levaquin [Levofloxacin] Other (See Comments)    Joint Pain  . Macrolides And Ketolides Nausea And Vomiting  . Paxil [Paroxetine Hcl] Nausea And Vomiting  . Penicillins Hives  . Prednisone     Altered mental state  . Tetracyclines & Related Nausea And Vomiting  . Zoloft [Sertraline Hcl] Nausea And Vomiting  . Sulfa Antibiotics Rash  . Trimethoprim Rash   Past Medical History  Diagnosis Date  . Esophageal stricture   . Diverticulosis   . Colon polyp     hyperplastic  . Tachycardia   . Anemia     mild  . Hyperlipidemia   . Prediabetes   . Palpitations   . Anxiety   . Hypertension     denies now   . GERD (gastroesophageal reflux disease)     no s/s now- off medicines   Health Maintenance  Topic Date Due  . Hepatitis C Screening  [birth year]  . INFLUENZA VACCINE  07/09/2016  . PNA vac Low Risk Adult (2 of 2 - PPSV23) 09/20/2016  . TETANUS/TDAP  12/11/2016  . MAMMOGRAM  08/10/2017  . COLONOSCOPY  03/05/2021  . DEXA SCAN  Completed  . ZOSTAVAX  Completed   Immunization History  Administered Date(s) Administered  . Influenza, High Dose Seasonal PF 11/14/2014  . Pneumococcal Conjugate-13 09/21/2015  . Pneumococcal Polysaccharide-23 02/14/2009  . Td 12/11/2006  . Tdap 12/09/2005  . Zoster 02/27/2011   Past Surgical History  Procedure Laterality Date  . Tonsillectomy    . Breast biopsy Left   . Cesarean section      x1  . Upper gastrointestinal endoscopy    . Polypectomy    . Colonoscopy    . Eye lift     Family History  Problem Relation Age of Onset  . Kidney cancer Father   . Hypertension Mother   . Heart disease Mother   . Colon cancer Paternal Aunt   . Colon polyps Neg Hx   . Esophageal cancer Neg Hx   . Rectal cancer Neg Hx   . Stomach cancer Neg Hx    Social History  Substance Use Topics  . Smoking status: Never Smoker   . Smokeless  tobacco: Never Used  . Alcohol Use: No    ROS Constitutional: Denies fever, chills, weight loss/gain, headaches, insomnia,  night sweats, and change in appetite. Does c/o fatigue. Eyes: Denies redness, blurred vision, diplopia, discharge, itchy, watery eyes.  ENT: Denies discharge, congestion, post nasal drip, epistaxis, sore throat, earache, hearing loss, dental pain, Tinnitus, Vertigo, Sinus pain, snoring.  Cardio: Denies chest pain, palpitations, irregular heartbeat, syncope, dyspnea, diaphoresis, orthopnea, PND, claudication, edema Respiratory: denies cough, dyspnea, DOE, pleurisy, hoarseness, laryngitis, wheezing.  Gastrointestinal: Denies dysphagia, heartburn, reflux, water brash, pain, cramps, nausea, vomiting, bloating, diarrhea, constipation, hematemesis, melena, hematochezia, jaundice, hemorrhoids Genitourinary: Denies dysuria, frequency, urgency, nocturia, hesitancy, discharge, hematuria, flank pain Breast: Breast lumps, nipple discharge, bleeding.  Musculoskeletal: Denies arthralgia, myalgia, stiffness, Jt. Swelling, pain, limp, and strain/sprain. Denies falls. Skin: Denies puritis, rash, hives, warts, acne, eczema, changing in skin lesion Neuro: No weakness, tremor, incoordination, spasms, paresthesia, pain Psychiatric: Denies confusion, memory loss, sensory loss. Denies Depression. Endocrine: Denies change in weight, skin, hair change, nocturia, and paresthesia, diabetic polys, visual blurring, hyper / hypo glycemic episodes.  Heme/Lymph: No excessive bleeding, bruising, enlarged lymph nodes.  Physical Exam  BP 112/70 mmHg  Pulse 80  Temp(Src) 97.5 F (36.4 C)  Resp 16  Ht 5' 6.5" (1.689 m)  Wt 149 lb 9.6 oz (67.858 kg)  BMI 23.79 kg/m2  LMP 12/09/1994  General Appearance: Well nourished and in no apparent distress. Eyes: PERRLA, EOMs, conjunctiva no swelling or erythema, normal fundi and vessels. Sinuses: No frontal/maxillary tenderness ENT/Mouth: EACs patent / TMs   nl. Nares clear without erythema, swelling, mucoid exudates. Oral hygiene is good. No erythema, swelling, or exudate. Tongue normal, non-obstructing. Tonsils not swollen or erythematous. Hearing normal.  Neck: Supple, thyroid normal. No bruits, nodes or JVD. Respiratory: Respiratory effort normal.  BS equal and clear bilateral without rales, rhonci, wheezing or stridor. Cardio: Heart sounds are normal with regular rate and rhythm and no murmurs, rubs or gallops. Peripheral pulses are normal and equal bilaterally without edema. No aortic or femoral bruits. Chest: symmetric with normal excursions and percussion. Breasts: Symmetric, without lumps, nipple discharge, retractions, or fibrocystic changes.  Abdomen: Flat, soft, with bowel sounds. Nontender, no guarding, rebound, hernias, masses, or organomegaly.  Lymphatics: Non tender without lymphadenopathy.  Musculoskeletal: Full ROM all peripheral extremities, joint stability, 5/5 strength, and normal gait. Skin: Warm and dry without rashes, lesions, cyanosis, clubbing or  ecchymosis.  Neuro: Cranial nerves intact, reflexes equal bilaterally. Normal muscle tone, no cerebellar symptoms. Sensation intact.  Pysch: Alert  and oriented X 3, normal affect, Insight and Judgment appropriate.   Assessment and Plan  1. Annual Preventative Screening Examination  2. Essential hypertension  - Microalbumin / creatinine urine ratio - EKG 12-Lead - [place], RETROPERITNL ABD,  LTD - TSH  3. Hyperlipidemia  - Lipid panel - TSH  4. Prediabetes  - Hemoglobin A1c - Insulin, random  5. Vitamin D deficiency  - VITAMIN D 25 Hydroxy   6. Gastroesophageal reflux disease   7. Osteopenia   8. Screening for rectal cancer  - POC Hemoccult Bld/Stl   9. Screening for AAA (aortic abdominal aneurysm)   10. Screening for ischemic heart disease   11. Medication management  - Urinalysis, Routine w reflex microscopic - CBC with Differential/Platelet -  BASIC METABOLIC PANEL WITH GFR - Hepatic function panel - Magnesium  12. Depression  - clomiPRAMINE (ANAFRANIL) 50 MG capsule; Take 1 capsule (50 mg total) by mouth at bedtime.   Continue prudent diet as discussed, weight control, BP monitoring, regular exercise, and medications. Discussed med's effects and SE's. Screening labs and tests as requested with regular follow-up as recommended. Over 40 minutes of exam, counseling, chart review and high complex critical decision making was performed.

## 2016-04-03 NOTE — Patient Instructions (Signed)
Recommend Adult Low Dose Aspirin or   coated  Aspirin 81 mg daily   To reduce risk of Colon Cancer 20 %,   Skin Cancer 26 % ,   Melanoma 46%   and   Pancreatic cancer 60%   ++++++++++++++++++++++++++++++++++++++++++++++++++++++ Vitamin D goal   is between 70-100.   Please make sure that you are taking your Vitamin D as directed.   It is very important as a natural anti-inflammatory   helping hair, skin, and nails, as well as reducing stroke and heart attack risk.   It helps your bones and helps with mood.  It also decreases numerous cancer risks so please take it as directed.   Low Vit D is associated with a 200-300% higher risk for CANCER   and 200-300% higher risk for HEART   ATTACK  &  STROKE.   .....................................Marland Kitchen  It is also associated with higher death rate at younger ages,   autoimmune diseases like Rheumatoid arthritis, Lupus, Multiple Sclerosis.     Also many other serious conditions, like depression, Alzheimer's  Dementia, infertility, muscle aches, fatigue, fibromyalgia - just to name a few.  ++++++++++++++++++++++++++++++++++++++++++++++++  Recommend the book "The END of DIETING" by Dr Excell Seltzer   & the book "The END of DIABETES " by Dr Excell Seltzer  At [email or web address] - get book & Audio CD's     Being diabetic has a  300% increased risk for heart attack, stroke, cancer, and alzheimer- type vascular dementia. It is very important that you work harder with diet by avoiding all foods that are white. Avoid white rice (brown & wild rice is OK), white potatoes (sweetpotatoes in moderation is OK), White bread or wheat bread or anything made out of white flour like bagels, donuts, rolls, buns, biscuits, cakes, pastries, cookies, pizza crust, and pasta (made from white flour & egg whites) - vegetarian pasta or spinach or wheat pasta is OK. Multigrain breads like Arnold's or Pepperidge [employer], or multigrain sandwich thins or flatbreads.  Diet,  exercise and weight loss can reverse and cure diabetes in the early stages.  Diet, exercise and weight loss is very important in the control and prevention of complications of diabetes which affects every system in your body, ie. Brain - dementia/stroke, eyes - glaucoma/blindness, heart - heart attack/heart failure, kidneys - dialysis, stomach - gastric paralysis, intestines - malabsorption, nerves - severe painful neuritis, circulation - gangrene & loss of a leg(s), and finally cancer and Alzheimers.    I recommend avoid fried & greasy foods,  sweets/candy, white rice (brown or wild rice or Quinoa is OK), white potatoes (sweet potatoes are OK) - anything made from white flour - bagels, doughnuts, rolls, buns, biscuits,white and wheat breads, pizza crust and traditional pasta made of white flour & egg white(vegetarian pasta or spinach or wheat pasta is OK).  Multi-grain bread is OK - like multi-grain flat bread or sandwich thins. Avoid alcohol in excess. Exercise is also important.    Eat all the vegetables you want - avoid meat, especially red meat and dairy - especially cheese.  Cheese is the most concentrated form of trans-fats which is the worst thing to clog up our arteries. Veggie cheese is OK which can be found in the fresh produce section at [employer] or [employer] or [employer]  ++++++++++++++++++++++++++++++++++++++++++++++++++ DASH Eating Plan  DASH stands for "Dietary Approaches to Stop Hypertension."   The DASH eating plan is a healthy eating plan that has been shown to reduce high blood  pressure (hypertension). Additional health benefits may include reducing the risk of type 2 diabetes mellitus, heart disease, and stroke. The DASH eating plan may also help with weight loss.  WHAT DO I NEED TO KNOW ABOUT THE DASH EATING PLAN?  For the DASH eating plan, you will follow these general guidelines:  Choose foods with a percent daily value for sodium of less than 5% (as listed on the food  label).  Use salt-free seasonings or herbs instead of table salt or sea salt.  Check with your health care provider or pharmacist before using salt substitutes.  Eat lower-sodium products, often labeled as "lower sodium" or "no salt added."  Eat fresh foods.  Eat more vegetables, fruits, and low-fat dairy products.    Choose whole grains. Look for the word "whole" as the first word in the ingredient list.  Choose fish   Limit sweets, desserts, sugars, and sugary drinks.  Choose heart-healthy fats.  Eat veggie cheese   Eat more home-cooked food and less restaurant, buffet, and fast food.  Limit fried foods.  [place]uffaker foods using methods other than frying.  Limit canned vegetables. If you do use them, rinse them well to decrease the sodium.  When eating at a restaurant, ask that your food be prepared with less salt, or no salt if possible.                      WHAT FOODS CAN I EAT?  Read Dr Fara Olden Fuhrman's books on The End of Dieting & The End of Diabetes  Grains  Whole grain or whole wheat bread. Brown rice. Whole grain or whole wheat pasta. Quinoa, bulgur, and whole grain cereals. Low-sodium cereals. Corn or whole wheat flour tortillas. Whole grain cornbread. Whole grain crackers. Low-sodium crackers.  Vegetables  Fresh or frozen vegetables (raw, steamed, roasted, or grilled). Low-sodium or reduced-sodium tomato and vegetable juices. Low-sodium or reduced-sodium tomato sauce and paste. Low-sodium or reduced-sodium canned vegetables.   Fruits  All fresh, canned (in natural juice), or frozen fruits.  Protein Products   All fish and seafood.  Dried beans, peas, or lentils. Unsalted nuts and seeds. Unsalted canned beans.  Dairy  Low-fat dairy products, such as skim or 1% milk, 2% or reduced-fat cheeses, low-fat ricotta or cottage cheese, or plain low-fat yogurt. Low-sodium or reduced-sodium cheeses.  Fats and Oils  Tub margarines without trans fats. Light or  reduced-fat mayonnaise and salad dressings (reduced sodium). Avocado. Safflower, olive, or canola oils. Natural peanut or almond butter.  Other  Unsalted popcorn and pretzels. The items listed above may not be a complete list of recommended foods or beverages. Contact your dietitian for more options.  +++++++++++++++++++++++++++++++++++++++++++  WHAT FOODS ARE NOT RECOMMENDED?  Grains/ White flour or wheat flour  White bread. White pasta. White rice. Refined cornbread. Bagels and croissants. Crackers that contain trans fat.  Vegetables  Creamed or fried vegetables. Vegetables in a . Regular canned vegetables. Regular canned tomato sauce and paste. Regular tomato and vegetable juices.  Fruits  Dried fruits. Canned fruit in light or heavy syrup. Fruit juice.  Meat and Other Protein Products  Meat in general - RED mwaet & White meat.  Fatty cuts of meat. Ribs, chicken wings, bacon, sausage, bologna, salami, chitterlings, fatback, hot dogs, bratwurst, and packaged luncheon meats.  Dairy  Whole or 2% milk, cream, half-and-half, and cream cheese. Whole-fat or sweetened yogurt. Full-fat cheeses or blue cheese. Nondairy creamers and whipped toppings. Processed cheese, cheese spreads, or  cheese curds.  Condiments  Onion and garlic salt, seasoned salt, table salt, and sea salt. Canned and packaged gravies. Worcestershire sauce. Tartar sauce. Barbecue sauce. Teriyaki sauce. Soy sauce, including reduced sodium. Steak sauce. Fish sauce. Oyster sauce. Cocktail sauce. Horseradish. Ketchup and mustard. Meat flavorings and tenderizers. Bouillon cubes. Hot sauce. Tabasco sauce. Marinades. Taco seasonings. Relishes.  Fats and Oils Butter, stick margarine, lard, shortening and bacon fat. Coconut, palm kernel, or palm oils. Regular salad dressings.  Pickles and olives. Salted popcorn and pretzels.  The items listed above may not be a complete list of foods and beverages to avoid.   Preventive  Care for Adults  A healthy lifestyle and preventive care can promote health and wellness. Preventive health guidelines for women include the following key practices.  A routine yearly physical is a good way to check with your health care provider about your health and preventive screening. It is a chance to share any concerns and updates on your health and to receive a thorough exam.  Visit your dentist for a routine exam and preventive care every 6 months. Brush your teeth twice a day and floss once a day. Good oral hygiene prevents tooth decay and gum disease.  The frequency of eye exams is based on your age, health, family medical history, use of contact lenses, and other factors. Follow your health care provider's recommendations for frequency of eye exams.  Eat a healthy diet. Foods like vegetables, fruits, whole grains, low-fat dairy products, and lean protein foods contain the nutrients you need without too many calories. Decrease your intake of foods high in solid fats, added sugars, and salt. Eat the right amount of calories for you.Get information about a proper diet from your health care provider, if necessary.  Regular physical exercise is one of the most important things you can do for your health. Most adults should get at least 150 minutes of moderate-intensity exercise (any activity that increases your heart rate and causes you to sweat) each week. In addition, most adults need muscle-strengthening exercises on 2 or more days a week.  Maintain a healthy weight. The body mass index (BMI) is a screening tool to identify possible weight problems. It provides an estimate of body fat based on height and weight. Your health care provider can find your BMI and can help you achieve or maintain a healthy weight.For adults 20 years and older:  A BMI below 18.5 is considered underweight.  A BMI of 18.5 to 24.9 is normal.  A BMI of 25 to 29.9 is considered overweight.  A BMI of 30 and  above is considered obese.  Maintain normal blood lipids and cholesterol levels by exercising and minimizing your intake of saturated fat. Eat a balanced diet with plenty of fruit and vegetables. If your lipid or cholesterol levels are high, you are over 50, or you are at high risk for heart disease, you may need your cholesterol levels checked more frequently.Ongoing high lipid and cholesterol levels should be treated with medicines if diet and exercise are not working.  If you smoke, find out from your health care provider how to quit. If you do not use tobacco, do not start.  Lung cancer screening is recommended for adults aged 56-80 years who are at high risk for developing lung cancer because of a history of smoking. A yearly low-dose CT scan of the lungs is recommended for people who have at least a 30-pack-year history of smoking and are a current smoker or  have quit within the past 15 years. A pack year of smoking is smoking an average of 1 pack of cigarettes a day for 1 year (for example: 1 pack a day for 30 years or 2 packs a day for 15 years). Yearly screening should continue until the smoker has stopped smoking for at least 15 years. Yearly screening should be stopped for people who develop a health problem that would prevent them from having lung cancer treatment.  Avoid use of street drugs. Do not share needles with anyone. Ask for help if you need support or instructions about stopping the use of drugs.  High blood pressure causes heart disease and increases the risk of stroke.  Ongoing high blood pressure should be treated with medicines if weight loss and exercise do not work.  If you are 52-67 years old, ask your health care provider if you should take aspirin to prevent strokes.  Diabetes screening involves taking a blood sample to check your fasting blood sugar level. This should be done once every 3 years, after age 9, if you are within normal weight and without risk factors for  diabetes. Testing should be considered at a younger age or be carried out more frequently if you are overweight and have at least 1 risk factor for diabetes.  Breast cancer screening is essential preventive care for women. You should practice "breast self-awareness." This means understanding the normal appearance and feel of your breasts and may include breast self-examination. Any changes detected, no matter how small, should be reported to a health care provider. Women in their 45s and 30s should have a clinical breast exam (CBE) by a health care provider as part of a regular health exam every 1 to 3 years. After age 76, women should have a CBE every year. Starting at age 54, women should consider having a mammogram (breast X-ray test) every year. Women who have a family history of breast cancer should talk to their health care provider about genetic screening. Women at a high risk of breast cancer should talk to their health care providers about having an MRI and a mammogram every year.  Breast cancer gene (BRCA)-related cancer risk assessment is recommended for women who have family members with BRCA-related cancers. BRCA-related cancers include breast, ovarian, tubal, and peritoneal cancers. Having family members with these cancers may be associated with an increased risk for harmful changes (mutations) in the breast cancer genes BRCA1 and BRCA2. Results of the assessment will determine the need for genetic counseling and BRCA1 and BRCA2 testing.  Routine pelvic exams to screen for cancer are no longer recommended for nonpregnant women who are considered low risk for cancer of the pelvic organs (ovaries, uterus, and vagina) and who do not have symptoms. Ask your health care provider if a screening pelvic exam is right for you.  If you have had past treatment for cervical cancer or a condition that could lead to cancer, you need Pap tests and screening for cancer for at least 20 years after your  treatment. If Pap tests have been discontinued, your risk factors (such as having a new sexual partner) need to be reassessed to determine if screening should be resumed. Some women have medical problems that increase the chance of getting cervical cancer. In these cases, your health care provider may recommend more frequent screening and Pap tests.    Colorectal cancer can be detected and often prevented. Most routine colorectal cancer screening begins at the age of 73 years and  continues through age 75 years. However, your health care provider may recommend screening at an earlier age if you have risk factors for colon cancer. On a yearly basis, your health care provider may provide home test kits to check for hidden blood in the stool. Use of a small camera at the end of a tube, to directly examine the colon (sigmoidoscopy or colonoscopy), can detect the earliest forms of colorectal cancer. Talk to your health care provider about this at age 50, when routine screening begins. Direct exam of the colon should be repeated every 5-10 years through age 75 years, unless early forms of pre-cancerous polyps or small growths are found.  Osteoporosis is a disease in which the bones lose minerals and strength with aging. This can result in serious bone fractures or breaks. The risk of osteoporosis can be identified using a bone density scan. Women ages 65 years and over and women at risk for fractures or osteoporosis should discuss screening with their health care providers. Ask your health care provider whether you should take a calcium supplement or vitamin D to reduce the rate of osteoporosis.  Menopause can be associated with physical symptoms and risks. Hormone replacement therapy is available to decrease symptoms and risks. You should talk to your health care provider about whether hormone replacement therapy is right for you.  Use sunscreen. Apply sunscreen liberally and repeatedly throughout the day. You  should seek shade when your shadow is shorter than you. Protect yourself by wearing long sleeves, pants, a wide-brimmed hat, and sunglasses year round, whenever you are outdoors.  Once a month, do a whole body skin exam, using a mirror to look at the skin on your back. Tell your health care provider of new moles, moles that have irregular borders, moles that are larger than a pencil eraser, or moles that have changed in shape or color.  Stay current with required vaccines (immunizations).  Influenza vaccine. All adults should be immunized every year.  Tetanus, diphtheria, and acellular pertussis (Td, Tdap) vaccine. Pregnant women should receive 1 dose of Tdap vaccine during each pregnancy. The dose should be obtained regardless of the length of time since the last dose. Immunization is preferred during the 27th-36th week of gestation. An adult who has not previously received Tdap or who does not know her vaccine status should receive 1 dose of Tdap. This initial dose should be followed by tetanus and diphtheria toxoids (Td) booster doses every 10 years. Adults with an unknown or incomplete history of completing a 3-dose immunization series with Td-containing vaccines should begin or complete a primary immunization series including a Tdap dose. Adults should receive a Td booster every 10 years.    Zoster vaccine. One dose is recommended for adults aged 60 years or older unless certain conditions are present.    Pneumococcal 13-valent conjugate (PCV13) vaccine. When indicated, a person who is uncertain of her immunization history and has no record of immunization should receive the PCV13 vaccine. An adult aged 19 years or older who has certain medical conditions and has not been previously immunized should receive 1 dose of PCV13 vaccine. This PCV13 should be followed with a dose of pneumococcal polysaccharide (PPSV23) vaccine. The PPSV23 vaccine dose should be obtained at least 8 weeks after the dose  of PCV13 vaccine. An adult aged 19 years or older who has certain medical conditions and previously received 1 or more doses of PPSV23 vaccine should receive 1 dose of PCV13. The PCV13 vaccine dose should   be obtained 1 or more years after the last PPSV23 vaccine dose.    Pneumococcal polysaccharide (PPSV23) vaccine. When PCV13 is also indicated, PCV13 should be obtained first. All adults aged 65 years and older should be immunized. An adult younger than age 65 years who has certain medical conditions should be immunized. Any person who resides in a nursing home or long-term care facility should be immunized. An adult smoker should be immunized. People with an immunocompromised condition and certain other conditions should receive both PCV13 and PPSV23 vaccines. People with human immunodeficiency virus (HIV) infection should be immunized as soon as possible after diagnosis. Immunization during chemotherapy or radiation therapy should be avoided. Routine use of PPSV23 vaccine is not recommended for American Indians, [place] Natives, or people younger than 65 years unless there are medical conditions that require PPSV23 vaccine. When indicated, people who have unknown immunization and have no record of immunization should receive PPSV23 vaccine. One-time revaccination 5 years after the first dose of PPSV23 is recommended for people aged 19-64 years who have chronic kidney failure, nephrotic syndrome, asplenia, or immunocompromised conditions. People who received 1-2 doses of PPSV23 before age 65 years should receive another dose of PPSV23 vaccine at age 65 years or later if at least 5 years have passed since the previous dose. Doses of PPSV23 are not needed for people immunized with PPSV23 at or after age 65 years.   Preventive Services / Frequency  Ages 65 years and over  Blood pressure check.  Lipid and cholesterol check.  Lung cancer screening. / Every year if you are aged 55-80 years and have a  30-pack-year history of smoking and currently smoke or have quit within the past 15 years. Yearly screening is stopped once you have quit smoking for at least 15 years or develop a health problem that would prevent you from having lung cancer treatment.  Clinical breast exam.** / Every year after age 40 years.  BRCA-related cancer risk assessment.** / For women who have family members with a BRCA-related cancer (breast, ovarian, tubal, or peritoneal cancers).  Mammogram.** / Every year beginning at age 40 years and continuing for as long as you are in good health. Consult with your health care provider.  Pap test.** / Every 3 years starting at age 30 years through age 65 or 70 years with 3 consecutive normal Pap tests. Testing can be stopped between 65 and 70 years with 3 consecutive normal Pap tests and no abnormal Pap or HPV tests in the past 10 years.  Fecal occult blood test (FOBT) of stool. / Every year beginning at age 50 years and continuing until age 75 years. You may not need to do this test if you get a colonoscopy every 10 years.  Flexible sigmoidoscopy or colonoscopy.** / Every 5 years for a flexible sigmoidoscopy or every 10 years for a colonoscopy beginning at age 50 years and continuing until age 75 years.  Hepatitis C blood test.** / For all people born from 1945 through 1965 and any individual with known risks for hepatitis C.  Osteoporosis screening.** / A one-time screening for women ages 65 years and over and women at risk for fractures or osteoporosis.  Skin self-exam. / Monthly.  Influenza vaccine. / Every year.  Tetanus, diphtheria, and acellular pertussis (Tdap/Td) vaccine.** / 1 dose of Td every 10 years.  Zoster vaccine.** / 1 dose for adults aged 60 years or older.  Pneumococcal 13-valent conjugate (PCV13) vaccine.** / Consult your health care provider.    Pneumococcal polysaccharide (PPSV23) vaccine.** / 1 dose for all adults aged 65 years and older. Screening  for abdominal aortic aneurysm (AAA)  by ultrasound is recommended for people who have history of high blood pressure or who are current or former smokers. 

## 2016-04-04 LAB — HEPATIC FUNCTION PANEL
ALT: 13 U/L (ref 6–29)
AST: 8 U/L — ABNORMAL LOW (ref 10–35)
Albumin: 3.8 g/dL (ref 3.6–5.1)
Alkaline Phosphatase: 64 U/L (ref 33–130)
BILIRUBIN INDIRECT: 0.4 mg/dL (ref 0.2–1.2)
Bilirubin, Direct: 0.1 mg/dL (ref <=0.2)
TOTAL PROTEIN: 6.5 g/dL (ref 6.1–8.1)
Total Bilirubin: 0.5 mg/dL (ref 0.2–1.2)

## 2016-04-04 LAB — BASIC METABOLIC PANEL WITH GFR
BUN: 13 mg/dL (ref 7–25)
CALCIUM: 9.3 mg/dL (ref 8.6–10.4)
CO2: 25 mmol/L (ref 20–31)
Chloride: 104 mmol/L (ref 98–110)
Creat: 0.82 mg/dL (ref 0.50–0.99)
GFR, EST AFRICAN AMERICAN: 84 mL/min (ref >=60)
GFR, EST NON AFRICAN AMERICAN: 73 mL/min (ref >=60)
GLUCOSE: 96 mg/dL (ref 65–99)
Potassium: 4.2 mmol/L (ref 3.5–5.3)
SODIUM: 139 mmol/L (ref 135–146)

## 2016-04-04 LAB — URINALYSIS, ROUTINE W REFLEX MICROSCOPIC
Bilirubin Urine: NEGATIVE
Glucose, UA: NEGATIVE
Hgb urine dipstick: NEGATIVE
KETONES UR: NEGATIVE
Leukocytes, UA: NEGATIVE
NITRITE: NEGATIVE
Protein, ur: NEGATIVE
SPECIFIC GRAVITY, URINE: 1.011 (ref 1.001–1.035)
pH: 8 (ref 5.0–8.0)

## 2016-04-04 LAB — HEMOGLOBIN A1C
Hgb A1c MFr Bld: 5.5 % (ref <5.7)
Mean Plasma Glucose: 111 mg/dL

## 2016-04-04 LAB — VITAMIN D 25 HYDROXY (VIT D DEFICIENCY, FRACTURES): Vit D, 25-Hydroxy: 48 ng/mL (ref 30–100)

## 2016-04-04 LAB — LIPID PANEL
Cholesterol: 202 mg/dL — ABNORMAL HIGH (ref 125–200)
HDL: 61 mg/dL (ref >=46)
LDL CALC: 120 mg/dL (ref <130)
Total CHOL/HDL Ratio: 3.3 Ratio (ref <=5.0)
Triglycerides: 104 mg/dL (ref <150)
VLDL: 21 mg/dL (ref <30)

## 2016-04-04 LAB — MAGNESIUM: Magnesium: 2.1 mg/dL (ref 1.5–2.5)

## 2016-04-04 LAB — INSULIN, RANDOM: Insulin: 20.6 u[IU]/mL — ABNORMAL HIGH (ref 2.0–19.6)

## 2016-04-04 LAB — MICROALBUMIN / CREATININE URINE RATIO
CREATININE, URINE: 42 mg/dL (ref 20–320)
Microalb Creat Ratio: 5 mcg/mg creat (ref <30)
Microalb, Ur: 0.2 mg/dL

## 2016-07-04 ENCOUNTER — Ambulatory Visit: Payer: Self-pay | Admitting: Internal Medicine

## 2016-07-08 NOTE — Progress Notes (Signed)
[facility]

## 2016-07-10 ENCOUNTER — Other Ambulatory Visit: Payer: Self-pay | Admitting: Internal Medicine

## 2016-10-06 ENCOUNTER — Other Ambulatory Visit: Payer: Self-pay | Admitting: Internal Medicine

## 2016-10-10 ENCOUNTER — Ambulatory Visit (INDEPENDENT_AMBULATORY_CARE_PROVIDER_SITE_OTHER): Payer: Medicare Other | Admitting: Internal Medicine

## 2016-10-10 ENCOUNTER — Encounter: Payer: Self-pay | Admitting: Internal Medicine

## 2016-10-10 VITALS — BP 110/72 | HR 64 | Temp 97.3°F | Resp 16 | Ht 66.5 in | Wt 153.4 lb

## 2016-10-10 DIAGNOSIS — K219 Gastro-esophageal reflux disease without esophagitis: Secondary | ICD-10-CM | POA: Diagnosis not present

## 2016-10-10 DIAGNOSIS — R7303 Prediabetes: Secondary | ICD-10-CM

## 2016-10-10 DIAGNOSIS — E782 Mixed hyperlipidemia: Secondary | ICD-10-CM

## 2016-10-10 DIAGNOSIS — Z79899 Other long term (current) drug therapy: Secondary | ICD-10-CM | POA: Diagnosis not present

## 2016-10-10 DIAGNOSIS — E559 Vitamin D deficiency, unspecified: Secondary | ICD-10-CM

## 2016-10-10 DIAGNOSIS — I10 Essential (primary) hypertension: Secondary | ICD-10-CM

## 2016-10-10 DIAGNOSIS — Z23 Encounter for immunization: Secondary | ICD-10-CM

## 2016-10-10 LAB — CBC WITH DIFFERENTIAL/PLATELET
BASOS PCT: 0 %
Basophils Absolute: 0 cells/uL (ref 0–200)
EOS PCT: 2 %
Eosinophils Absolute: 118 cells/uL (ref 15–500)
HCT: 40 % (ref 35.0–45.0)
HEMOGLOBIN: 13 g/dL (ref 11.7–15.5)
LYMPHS ABS: 2419 {cells}/uL (ref 850–3900)
Lymphocytes Relative: 41 %
MCH: 29.1 pg (ref 27.0–33.0)
MCHC: 32.5 g/dL (ref 32.0–36.0)
MCV: 89.7 fL (ref 80.0–100.0)
MONOS PCT: 8 %
MPV: 9.1 fL (ref 7.5–12.5)
Monocytes Absolute: 472 cells/uL (ref 200–950)
NEUTROS ABS: 2891 {cells}/uL (ref 1500–7800)
Neutrophils Relative %: 49 %
PLATELETS: 219 10*3/uL (ref 140–400)
RBC: 4.46 MIL/uL (ref 3.80–5.10)
RDW: 13.7 % (ref 11.0–15.0)
WBC: 5.9 10*3/uL (ref 3.8–10.8)

## 2016-10-10 LAB — TSH: TSH: 1.28 m[IU]/L

## 2016-10-10 NOTE — Patient Instructions (Signed)

## 2016-10-10 NOTE — Progress Notes (Signed)
[facility] Unk Pinto, M.D.        Uvaldo Bristle. Silverio Lay, P.A.-C       Starlyn Skeans, P.A.-C  [facility]                [address] [place] [address] Telephone [phone] Telefax [phone] ______________________________________________________________________     This very nice 70 y.o. MWF presents for 6 month follow up with Hypertension, Hyperlipidemia, Pre-Diabetes and Vitamin D Deficiency.      Patient is followed  for labile HTN for years & BP has been controlled at home. Today's BP is  110/72. Patient has had no complaints of any cardiac type chest pain, palpitations, dyspnea/orthopnea/PND, dizziness, claudication, or dependent edema.     Hyperlipidemia is controlled with diet & meds. Patient denies myalgias or other med SE's. Current  Lipids are at goal: Lab Results  Component Value Date   CHOL 182 10/10/2016   HDL 59 10/10/2016   LDLCALC 97 10/10/2016   TRIG 132 10/10/2016   CHOLHDL 3.1 10/10/2016      Also, the patient has history of PreDiabetes circa 2011 with A1c 5.8% and has had no symptoms of reactive hypoglycemia, diabetic polys, paresthesias or visual blurring.  Current A1c is at goal: Lab Results  Component Value Date   HGBA1C 5.3 10/10/2016      Further, the patient also has history of Vitamin D Deficiency in 2008 of "36"  and supplements vitamin D without any suspected side-effects. Current vitamin D is still low (goal 70-100):  Lab Results  Component Value Date   VD25OH 45 10/10/2016   Current Outpatient Prescriptions on File Prior to Visit  Medication Sig  . atenolol  25 MG  TAKE 1/2-1 TAB DAILY FOR BLOOD  PRESSURE  . Calcium 1500 MG  Take 1,500 mg by mouth.  Marland Kitchen VITAMIN D 1000 UNITS  Take 1,000 Units by mouth daily.  . ANAFRANIL 50 Mg Take 1 capsule (50 mg total) by mouth at bedtime.  Marland Kitchen ezetimibe (ZETIA) 10 MG Take 1 tablet by mouth  every day for  cholesterol  . fish oil-omega-3  1000 MG  Take 1 g by mouth daily.  . magnesium 30 MG Take 30 mg by mouth 2 (two) times daily.  . Multiple Vitamin Take 1 tablet by mouth daily.  Marland Kitchen EYE VITAMINS  Take 1 capsule by mouth daily. ocusight  . triamcinolone crm  0.1 % Apply topically 4 (four) times daily.  . vitamin C  500 MG tablet Take 500 mg by mouth daily.  Marland Kitchen NASONEX  nasal spray Place 2 sprays into the nose daily.   Allergies  Allergen Reactions  . Asa [Aspirin] Diarrhea  . Levaquin [Levofloxacin] Other (See Comments)    Joint Pain  . Macrolides And Ketolides Nausea And Vomiting  . Paxil [Paroxetine Hcl] Nausea And Vomiting  . Penicillins Hives  . Prednisone     Altered mental state  . Tetracyclines & Related Nausea And Vomiting  . Zoloft [Sertraline Hcl] Nausea And Vomiting  . Sulfa Antibiotics Rash  . Trimethoprim Rash   PMHx:   Past Medical History:  Diagnosis Date  . Anemia    mild  . Anxiety   . Colon polyp    hyperplastic  . Diverticulosis   . Esophageal stricture   . GERD (gastroesophageal reflux disease)    no s/s now- off  medicines  . Hyperlipidemia   . Hypertension    denies now   . Palpitations   . Prediabetes   . Tachycardia    Immunization History  Administered Date(s) Administered  . Influenza, High Dose Seasonal PF 11/14/2014, 10/10/2016  . Pneumococcal Conjugate-13 09/21/2015  . Pneumococcal Polysaccharide-23 02/14/2009  . Td 12/11/2006  . Tdap 12/09/2005  . Zoster 02/27/2011   Past Surgical History:  Procedure Laterality Date  . BREAST BIOPSY Left   . CESAREAN SECTION     x1  . COLONOSCOPY    . eye lift    . POLYPECTOMY    . TONSILLECTOMY    . UPPER GASTROINTESTINAL ENDOSCOPY     FHx:    Reviewed / unchanged  SHx:    Reviewed / unchanged  Systems Review:  Constitutional: Denies fever, chills, wt changes, headaches, insomnia, fatigue, night sweats, change in appetite. Eyes: Denies redness, blurred vision, diplopia, discharge, itchy,  watery eyes.  ENT: Denies discharge, congestion, post nasal drip, epistaxis, sore throat, earache, hearing loss, dental pain, tinnitus, vertigo, sinus pain, snoring.  CV: Denies chest pain, palpitations, irregular heartbeat, syncope, dyspnea, diaphoresis, orthopnea, PND, claudication or edema. Respiratory: denies cough, dyspnea, DOE, pleurisy, hoarseness, laryngitis, wheezing.  Gastrointestinal: Denies dysphagia, odynophagia, heartburn, reflux, water brash, abdominal pain or cramps, nausea, vomiting, bloating, diarrhea, constipation, hematemesis, melena, hematochezia  or hemorrhoids. Genitourinary: Denies dysuria, frequency, urgency, nocturia, hesitancy, discharge, hematuria or flank pain. Musculoskeletal: Denies arthralgias, myalgias, stiffness, jt. swelling, pain, limping or strain/sprain.  Skin: Denies pruritus, rash, hives, warts, acne, eczema or change in skin lesion(s). Neuro: No weakness, tremor, incoordination, spasms, paresthesia or pain. Psychiatric: Denies confusion, memory loss or sensory loss. Endo: Denies change in weight, skin or hair change.  Heme/Lymph: No excessive bleeding, bruising or enlarged lymph nodes.  Physical Exam BP 110/72   Pulse 64   Temp 97.3 F (36.3 C)   Resp 16   Ht 5' 6.5" (1.689 m)   Wt 153 lb 6.4 oz (69.6 kg)   LMP 12/09/1994   BMI 24.39 kg/m   Appears well nourished and in no distress.  Eyes: PERRLA, EOMs, conjunctiva no swelling or erythema. Sinuses: No frontal/maxillary tenderness ENT/Mouth: EAC's clear, TM's nl w/o erythema, bulging. Nares clear w/o erythema, swelling, exudates. Oropharynx clear without erythema or exudates. Oral hygiene is good. Tongue normal, non obstructing. Hearing intact.  Neck: Supple. Thyroid nl. Car 2+/2+ without bruits, nodes or JVD. Chest: Respirations nl with BS clear & equal w/o rales, rhonchi, wheezing or stridor.  Cor: Heart sounds normal w/ regular rate and rhythm without sig. murmurs, gallops, clicks, or rubs.  Peripheral pulses normal and equal  without edema.  Abdomen: Soft & bowel sounds normal. Non-tender w/o guarding, rebound, hernias, masses, or organomegaly.  Lymphatics: Unremarkable.  Musculoskeletal: Full ROM all peripheral extremities, joint stability, 5/5 strength, and normal gait.  Skin: Warm, dry without exposed rashes, lesions or ecchymosis apparent.  Neuro: Cranial nerves intact, reflexes equal bilaterally. Sensory-motor testing grossly intact. Tendon reflexes grossly intact.  Pysch: Alert & oriented x 3.  Insight and judgement nl & appropriate. No ideations.  Assessment and Plan:   1. Essential hypertension  - Continue medication, monitor blood pressure at home.  - Continue DASH diet. Reminder to go to the ER if any CP, SOB, nausea,  dizziness, severe HA, changes vision/speech, left arm numbness and tingling and jaw pain. - TSH  2. Mixed hyperlipidemia  - Continue diet/meds, exercise,& lifestyle modifications.  - Continue monitor periodic cholesterol/liver & renal functions   -  Lipid panel - TSH  3. Prediabetes  - Continue diet, exercise, lifestyle modifications.  - Monitor appropriate labs. - Hemoglobin A1c - Insulin, random  4. Vitamin D deficiency  - Continue supplementation. - VITAMIN D 25 Hydroxy  5. Gastroesophageal reflux disease   6. Medication management  - CBC with Differential/Platelet - BASIC METABOLIC PANEL WITH GFR - Hepatic function panel - Magnesium  7. Need for prophylactic vaccination and inoculation against influenza  - Flu vaccine HIGH DOSE PF (Fluzone High dose)       Recommended regular exercise, BP monitoring, weight control, and discussed med and SE's. Recommended labs to assess and monitor clinical status. Further disposition pending results of labs. Over 30 minutes of exam, counseling, chart review was performed

## 2016-10-11 LAB — BASIC METABOLIC PANEL WITH GFR
BUN: 12 mg/dL (ref 7–25)
CHLORIDE: 104 mmol/L (ref 98–110)
CO2: 25 mmol/L (ref 20–31)
Calcium: 9.5 mg/dL (ref 8.6–10.4)
Creat: 0.83 mg/dL (ref 0.50–0.99)
GFR, EST NON AFRICAN AMERICAN: 72 mL/min (ref >=60)
GFR, Est African American: 83 mL/min (ref >=60)
Glucose, Bld: 77 mg/dL (ref 65–99)
POTASSIUM: 4 mmol/L (ref 3.5–5.3)
SODIUM: 140 mmol/L (ref 135–146)

## 2016-10-11 LAB — HEPATIC FUNCTION PANEL
ALK PHOS: 64 U/L (ref 33–130)
ALT: 12 U/L (ref 6–29)
AST: 9 U/L — AB (ref 10–35)
Albumin: 4.1 g/dL (ref 3.6–5.1)
BILIRUBIN DIRECT: 0.1 mg/dL (ref <=0.2)
BILIRUBIN TOTAL: 0.5 mg/dL (ref 0.2–1.2)
Indirect Bilirubin: 0.4 mg/dL (ref 0.2–1.2)
Total Protein: 6.6 g/dL (ref 6.1–8.1)

## 2016-10-11 LAB — LIPID PANEL
CHOL/HDL RATIO: 3.1 ratio (ref <=5.0)
Cholesterol: 182 mg/dL (ref 125–200)
HDL: 59 mg/dL (ref >=46)
LDL CALC: 97 mg/dL (ref <130)
TRIGLYCERIDES: 132 mg/dL (ref <150)
VLDL: 26 mg/dL (ref <30)

## 2016-10-11 LAB — MAGNESIUM: Magnesium: 2 mg/dL (ref 1.5–2.5)

## 2016-10-11 LAB — HEMOGLOBIN A1C
Hgb A1c MFr Bld: 5.3 % (ref <5.7)
Mean Plasma Glucose: 105 mg/dL

## 2016-10-11 LAB — VITAMIN D 25 HYDROXY (VIT D DEFICIENCY, FRACTURES): VIT D 25 HYDROXY: 45 ng/mL (ref 30–100)

## 2016-10-11 LAB — INSULIN, RANDOM: Insulin: 6 u[IU]/mL (ref 2.0–19.6)

## 2016-10-12 ENCOUNTER — Encounter: Payer: Self-pay | Admitting: Internal Medicine

## 2016-12-24 ENCOUNTER — Other Ambulatory Visit: Payer: Self-pay | Admitting: Internal Medicine

## 2017-01-23 ENCOUNTER — Ambulatory Visit: Payer: Self-pay | Admitting: Physician Assistant

## 2017-05-13 ENCOUNTER — Encounter: Payer: Self-pay | Admitting: Internal Medicine

## 2017-05-13 ENCOUNTER — Ambulatory Visit (INDEPENDENT_AMBULATORY_CARE_PROVIDER_SITE_OTHER): Payer: Medicare Other | Admitting: Internal Medicine

## 2017-05-13 VITALS — BP 104/60 | HR 72 | Temp 97.7°F | Resp 16 | Ht 66.5 in | Wt 151.6 lb

## 2017-05-13 DIAGNOSIS — Z79899 Other long term (current) drug therapy: Secondary | ICD-10-CM

## 2017-05-13 DIAGNOSIS — I10 Essential (primary) hypertension: Secondary | ICD-10-CM

## 2017-05-13 DIAGNOSIS — Z136 Encounter for screening for cardiovascular disorders: Secondary | ICD-10-CM | POA: Diagnosis not present

## 2017-05-13 DIAGNOSIS — Z23 Encounter for immunization: Secondary | ICD-10-CM | POA: Diagnosis not present

## 2017-05-13 DIAGNOSIS — K219 Gastro-esophageal reflux disease without esophagitis: Secondary | ICD-10-CM

## 2017-05-13 DIAGNOSIS — Z0001 Encounter for general adult medical examination with abnormal findings: Secondary | ICD-10-CM

## 2017-05-13 DIAGNOSIS — E782 Mixed hyperlipidemia: Secondary | ICD-10-CM | POA: Diagnosis not present

## 2017-05-13 DIAGNOSIS — E559 Vitamin D deficiency, unspecified: Secondary | ICD-10-CM

## 2017-05-13 DIAGNOSIS — Z1212 Encounter for screening for malignant neoplasm of rectum: Secondary | ICD-10-CM

## 2017-05-13 DIAGNOSIS — R7303 Prediabetes: Secondary | ICD-10-CM

## 2017-05-13 LAB — CBC WITH DIFFERENTIAL/PLATELET
BASOS PCT: 0 %
Basophils Absolute: 0 cells/uL (ref 0–200)
EOS PCT: 3 %
Eosinophils Absolute: 207 cells/uL (ref 15–500)
HEMATOCRIT: 40.2 % (ref 35.0–45.0)
HEMOGLOBIN: 13.3 g/dL (ref 11.7–15.5)
LYMPHS ABS: 2208 {cells}/uL (ref 850–3900)
LYMPHS PCT: 32 %
MCH: 29.4 pg (ref 27.0–33.0)
MCHC: 33.1 g/dL (ref 32.0–36.0)
MCV: 88.9 fL (ref 80.0–100.0)
MONO ABS: 552 {cells}/uL (ref 200–950)
MPV: 9.4 fL (ref 7.5–12.5)
Monocytes Relative: 8 %
NEUTROS PCT: 57 %
Neutro Abs: 3933 cells/uL (ref 1500–7800)
Platelets: 231 10*3/uL (ref 140–400)
RBC: 4.52 MIL/uL (ref 3.80–5.10)
RDW: 13.8 % (ref 11.0–15.0)
WBC: 6.9 10*3/uL (ref 3.8–10.8)

## 2017-05-13 NOTE — Progress Notes (Signed)
[facility] Unk Pinto, M.D.      Uvaldo Bristle. Silverio Lay, P.A.-C [facility]                [address] [place], [place] [address] Telephone [phone] Telefax [phone]  Annual Screening/Preventative Visit & Comprehensive Evaluation &  Examination     This very nice 71 y.o. MWF presents for a Screening/Preventative Visit & comprehensive evaluation and management of multiple medical co-morbidities.  Patient has been followed for HTN, Prediabetes, Hyperlipidemia and Vitamin D Deficiency. Patient has hx/o of a strong tendency for poor compliance and stopping her medications. Today patient wanted to elaborate on her past lives and re-incarnations and was difficult to keep directed toward he physical issues. She states her husband & son do't understand her and her beliefs.       Labile HTN predates many years circa 1990's  and is controlled on low dose beta blocker.  Patient denies any cardiac symptoms as chest pain, palpitations, shortness of breath, dizziness or ankle swelling. Today's BP is at goal - 104/60.      Patient's hyperlipidemia is controlled with diet, but apparently she has stopped her Zetia. Patient denies myalgias or other medication SE's. Last lipids were at goal: Lab Results  Component Value Date   CHOL 182 10/10/2016   HDL 59 10/10/2016   LDLCALC 97 10/10/2016   TRIG 132 10/10/2016   CHOLHDL 3.1 10/10/2016      Patient has prediabetes (A1c 5.8% in 2011) and patient denies reactive hypoglycemic symptoms, visual blurring, diabetic polys, or paresthesias. Last A1c was at goal: Lab Results  Component Value Date   HGBA1C 5.3 10/10/2016      Finally, patient has history of Vitamin D Deficiency ("36" in 2008) and last Vitamin D was still low (goal 70-100): Lab Results  Component Value Date   VD25OH 45 10/10/2016   Current Outpatient Prescriptions on File Prior to Visit  Medication Sig   . atenolol  25 MG tablet TAKE 1/2 TO 1 TABLET BY  MOUTH DAILY FOR BLOOD  PRESSURE  . Calcium 1500 MG tablet Take 1,500 mg by mouth.  Marland Kitchen VITAMIN D 1000 UNITS  Take 1,000 Units by mouth daily.  . ANAFRANIL 50 MG cap Take 1 capsule (50 mg total) by mouth at bedtime.  . clonazePAM  2 MG tab   . fish oil-omega-3  1000 MG  Take 1 g by mouth daily.  . magnesium 30 MG Take 30 mg by mouth 2 (two) times daily.  . Multiple Vitamin t Take 1 tablet by mouth daily.  Marland Kitchen EYE VITAMINS Take 1 capsule by mouth daily. ocusight  . KENALOG  crm 0.1 % Apply topically 4 (four) times daily.  . vitamin C 500 MG tablet Take 500 mg by mouth daily.  Marland Kitchen ezetimibe  10 MG tablet Patient not taking: Reported on 05/13/2017)  . NASONEX nasal spray Place 2 sprays into the nose daily.   Allergies  Allergen Reactions  . Asa [Aspirin] Diarrhea  . Levaquin [Levofloxacin] Other (See Comments)    Joint Pain  . Macrolides And Ketolides Nausea And Vomiting  . Paxil [Paroxetine Hcl] Nausea And Vomiting  . Penicillins Hives  . Prednisone     Altered mental state  . Tetracyclines & Related Nausea And Vomiting  . Zoloft [Sertraline Hcl] Nausea And Vomiting  . Sulfa Antibiotics Rash  .  Trimethoprim Rash   Past Medical History:  Diagnosis Date  . Anemia    mild  . Anxiety   . Colon polyp    hyperplastic  . Diverticulosis   . Esophageal stricture   . GERD (gastroesophageal reflux disease)    no s/s now- off medicines  . Hyperlipidemia   . Hypertension    denies now   . Palpitations   . Prediabetes   . Tachycardia    Health Maintenance  Topic Date Due  . Hepatitis C Screening  11/15/1946  . PNA vac Low Risk Adult (2 of 2 - PPSV23) 09/20/2016  . TETANUS/TDAP  12/11/2016  . INFLUENZA VACCINE  07/09/2017  . MAMMOGRAM  08/10/2017  . COLONOSCOPY  03/05/2021  . DEXA SCAN  Completed   Immunization History  Administered Date(s) Administered  . Influenza, High Dose Seasonal PF 11/14/2014, 10/10/2016  . Pneumococcal  Conjugate-13 09/21/2015  . Pneumococcal Polysaccharide-23 02/14/2009  . Td 12/11/2006  . Tdap 12/09/2005  . Zoster 02/27/2011   Past Surgical History:  Procedure Laterality Date  . BREAST BIOPSY Left   . CESAREAN SECTION     x1  . COLONOSCOPY    . eye lift    . POLYPECTOMY    . TONSILLECTOMY    . UPPER GASTROINTESTINAL ENDOSCOPY     Family History  Problem Relation Age of Onset  . Kidney cancer Father   . Hypertension Mother   . Heart disease Mother   . Colon cancer Paternal Aunt   . Colon polyps Neg Hx   . Esophageal cancer Neg Hx   . Rectal cancer Neg Hx   . Stomach cancer Neg Hx    Social History  Substance Use Topics  . Smoking status: Never Smoker  . Smokeless tobacco: Never Used  . Alcohol use No    ROS Constitutional: Denies fever, chills, weight loss/gain, headaches, insomnia,  night sweats, and change in appetite. Does c/o fatigue. Eyes: Denies redness, blurred vision, diplopia, discharge, itchy, watery eyes.  ENT: Denies discharge, congestion, post nasal drip, epistaxis, sore throat, earache, hearing loss, dental pain, Tinnitus, Vertigo, Sinus pain, snoring.  Cardio: Denies chest pain, palpitations, irregular heartbeat, syncope, dyspnea, diaphoresis, orthopnea, PND, claudication, edema Respiratory: denies cough, dyspnea, DOE, pleurisy, hoarseness, laryngitis, wheezing.  Gastrointestinal: Denies dysphagia, heartburn, reflux, water brash, pain, cramps, nausea, vomiting, bloating, diarrhea, constipation, hematemesis, melena, hematochezia, jaundice, hemorrhoids Genitourinary: Denies dysuria, frequency, urgency, nocturia, hesitancy, discharge, hematuria, flank pain Breast: Breast lumps, nipple discharge, bleeding.  Musculoskeletal: Denies arthralgia, myalgia, stiffness, Jt. Swelling, pain, limp, and strain/sprain. Denies falls. Skin: Denies puritis, rash, hives, warts, acne, eczema, changing in skin lesion Neuro: No weakness, tremor, incoordination, spasms,  paresthesia, pain Psychiatric: Denies confusion, memory loss, sensory loss. Denies Depression. Endocrine: Denies change in weight, skin, hair change, nocturia, and paresthesia, diabetic polys, visual blurring, hyper / hypo glycemic episodes.  Heme/Lymph: No excessive bleeding, bruising, enlarged lymph nodes.  Physical Exam  BP 104/60   Pulse 72   Temp 97.7 F (36.5 C)   Resp 16   Ht 5' 6.5" (1.689 m)   Wt 151 lb 9.6 oz (68.8 kg)   LMP 12/09/1994   BMI 24.10 kg/m   General Appearance: Well nourished, well groomed and in no apparent distress.  Eyes: PERRLA, EOMs, conjunctiva no swelling or erythema, normal fundi and vessels. Sinuses: No frontal/maxillary tenderness ENT/Mouth: EACs patent / TMs  nl. Nares clear without erythema, swelling, mucoid exudates. Oral hygiene is good. No erythema, swelling, or exudate. Tongue normal, non-obstructing.  Tonsils not swollen or erythematous. Hearing normal.  Neck: Supple, thyroid normal. No bruits, nodes or JVD. Respiratory: Respiratory effort normal.  BS equal and clear bilateral without rales, rhonci, wheezing or stridor. Cardio: Heart sounds are normal with regular rate and rhythm and no murmurs, rubs or gallops. Peripheral pulses are normal and equal bilaterally without edema. No aortic or femoral bruits. Chest: symmetric with normal excursions and percussion. Breasts: Symmetric, without lumps, nipple discharge, retractions, or fibrocystic changes.  Abdomen: Flat, soft with bowel sounds active. Nontender, no guarding, rebound, hernias, masses, or organomegaly.  Lymphatics: Non tender without lymphadenopathy.  Genitourinary:  Musculoskeletal: Full ROM all peripheral extremities, joint stability, 5/5 strength, and normal gait. Skin: Warm and dry without rashes, lesions, cyanosis, clubbing or  ecchymosis.  Neuro: Cranial nerves intact, reflexes equal bilaterally. Normal muscle tone, no cerebellar symptoms. Sensation intact.  Pysch: Alert and  oriented X 3, normal affect, Insight and Judgment appropriate.   Assessment and Plan  1. Annual Preventative Screening Examination  2. Essential hypertension  - EKG 12-Lead - Urinalysis, Routine w reflex microscopic - Microalbumin / creatinine urine ratio - CBC with Differential/Platelet - BASIC METABOLIC PANEL WITH GFR - Magnesium - TSH  3. Hyperlipidemia, mixed  - EKG 12-Lead - Hepatic function panel - Lipid panel - TSH  4. Prediabetes  - EKG 12-Lead - Hemoglobin A1c - Insulin, random  5. Vitamin D deficiency  - VITAMIN D 25 Hydroxy   6. Gastroesophageal reflux disease   7. Screening for rectal cancer  - POC Hemoccult Bld/Stl   8. Screening for ischemic heart disease  - EKG 12-Lead  9. Medication management  - Urinalysis, Routine w reflex microscopic - Microalbumin / creatinine urine ratio - CBC with Differential/Platelet - BASIC METABOLIC PANEL WITH GFR - Hepatic function panel - Magnesium - Lipid panel - TSH - Hemoglobin A1c - Insulin, random - VITAMIN D 25 Hydroxy   10. Need for prophylactic vaccination against Streptococcus pneumoniae (pneumococcus)  - Pneumococcal polysaccharide vaccine 23-valent greater than or equal to 2 yo subcutaneous/IM       Patient was counseled in prudent diet to achieve/maintain BMI less than 25 for weight control, BP monitoring, regular exercise and medications. Discussed med's effects and SE's. Screening labs and tests as requested with regular follow-up as recommended. Over 40 minutes of exam, counseling, chart review and high complex critical decision making was performed.

## 2017-05-13 NOTE — Patient Instructions (Signed)

## 2017-05-14 LAB — INSULIN, RANDOM: Insulin: 18.6 u[IU]/mL (ref 2.0–19.6)

## 2017-05-14 LAB — URINALYSIS, ROUTINE W REFLEX MICROSCOPIC
BILIRUBIN URINE: NEGATIVE
GLUCOSE, UA: NEGATIVE
Hgb urine dipstick: NEGATIVE
Ketones, ur: NEGATIVE
LEUKOCYTES UA: NEGATIVE
Nitrite: NEGATIVE
PROTEIN: NEGATIVE
SPECIFIC GRAVITY, URINE: 1.01 (ref 1.001–1.035)
pH: 7 (ref 5.0–8.0)

## 2017-05-14 LAB — HEPATIC FUNCTION PANEL
ALK PHOS: 61 U/L (ref 33–130)
ALT: 10 U/L (ref 6–29)
AST: 7 U/L — ABNORMAL LOW (ref 10–35)
Albumin: 4 g/dL (ref 3.6–5.1)
BILIRUBIN DIRECT: 0.1 mg/dL (ref <=0.2)
BILIRUBIN INDIRECT: 0.3 mg/dL (ref 0.2–1.2)
Total Bilirubin: 0.4 mg/dL (ref 0.2–1.2)
Total Protein: 6.5 g/dL (ref 6.1–8.1)

## 2017-05-14 LAB — VITAMIN D 25 HYDROXY (VIT D DEFICIENCY, FRACTURES): Vit D, 25-Hydroxy: 50 ng/mL (ref 30–100)

## 2017-05-14 LAB — BASIC METABOLIC PANEL WITH GFR
BUN: 16 mg/dL (ref 7–25)
CALCIUM: 9.2 mg/dL (ref 8.6–10.4)
CO2: 21 mmol/L (ref 20–31)
Chloride: 106 mmol/L (ref 98–110)
Creat: 0.83 mg/dL (ref 0.60–0.93)
GFR, EST AFRICAN AMERICAN: 83 mL/min (ref >=60)
GFR, EST NON AFRICAN AMERICAN: 72 mL/min (ref >=60)
Glucose, Bld: 97 mg/dL (ref 65–99)
POTASSIUM: 4.2 mmol/L (ref 3.5–5.3)
SODIUM: 139 mmol/L (ref 135–146)

## 2017-05-14 LAB — LIPID PANEL
CHOL/HDL RATIO: 3.6 ratio (ref <5.0)
CHOLESTEROL: 224 mg/dL — AB (ref <200)
HDL: 62 mg/dL (ref >50)
LDL CALC: 129 mg/dL — AB (ref <100)
TRIGLYCERIDES: 165 mg/dL — AB (ref <150)
VLDL: 33 mg/dL — AB (ref <30)

## 2017-05-14 LAB — MICROALBUMIN / CREATININE URINE RATIO
Creatinine, Urine: 48 mg/dL (ref 20–320)
MICROALB/CREAT RATIO: 15 ug/mg{creat} (ref <30)
Microalb, Ur: 0.7 mg/dL

## 2017-05-14 LAB — TSH: TSH: 1.83 mIU/L

## 2017-05-14 LAB — HEMOGLOBIN A1C
Hgb A1c MFr Bld: 5.4 % (ref <5.7)
MEAN PLASMA GLUCOSE: 108 mg/dL

## 2017-05-14 LAB — MAGNESIUM: Magnesium: 2.1 mg/dL (ref 1.5–2.5)

## 2017-06-12 ENCOUNTER — Other Ambulatory Visit: Payer: Self-pay | Admitting: *Deleted

## 2017-06-12 DIAGNOSIS — Z1212 Encounter for screening for malignant neoplasm of rectum: Secondary | ICD-10-CM

## 2017-06-12 LAB — POC HEMOCCULT BLD/STL (HOME/3-CARD/SCREEN)
Card #2 Fecal Occult Blod, POC: NEGATIVE
FECAL OCCULT BLD: NEGATIVE
Fecal Occult Blood, POC: NEGATIVE

## 2017-09-01 NOTE — Progress Notes (Deleted)
MEDICARE ANNUAL WELLNESS VISIT AND FOLLOW UP Assessment:    Essential hypertension - continue medications, DASH diet, exercise and monitor at home. Call if greater than 130/80.  - CBC with Differential/Platelet - BASIC METABOLIC PANEL WITH GFR - Hepatic function panel - TSH   Gastroesophageal reflux disease, esophagitis presence not specified Continue PPI/H2 blocker, diet discussed  Prediabetes - Hemoglobin A1c   Hyperlipidemia - Lipid panel  Medication management - Magnesium  Vitamin D deficiency - VITAMIN D 25 Hydroxy (Vit-D Deficiency, Fractures)  Osteopenia Get DEXA   ESOPHAGEAL STRICTURE Continue PPI/H2 blocker, diet discussed  PERSONAL HX COLONIC POLYPS Due 2020   palpitations Continue atenolol  Over 30 minutes of exam, counseling, chart review, and critical decision making was performed Future Appointments Date Time Provider [place]  09/02/2017 4:30 PM Vicie Mutters, PA-C [facility] None  12/03/2017 4:30 PM Unk Pinto, MD [facility] None  06/09/2018 3:00 PM Unk Pinto, MD [facility] None    Plan:   During the course of the visit the patient was educated and counseled about appropriate screening and preventive services including:    Pneumococcal vaccine   Influenza vaccine  Prevnar 13  Td vaccine  Screening electrocardiogram  Colorectal cancer screening  Diabetes screening  Glaucoma screening  Nutrition counseling    Subjective:  Donna Dillon is a 71 y.o. female who presents for Medicare Annual Wellness Visit and 3 month follow up for HTN, hyperlipidemia, prediabetes, and vitamin D Def.   Her blood pressure has been controlled at home, today their BP is   She does workout, walking treadmill 30 mins a day. She denies chest pain, shortness of breath, dizziness.  She is on cholesterol medication, on zetia, and denies myalgias. Her cholesterol is at goal. The cholesterol last visit was:   Lab Results  Component  Value Date   CHOL 224 (H) 05/13/2017   HDL 62 05/13/2017   LDLCALC 129 (H) 05/13/2017   TRIG 165 (H) 05/13/2017   CHOLHDL 3.6 05/13/2017   She has been working on diet and exercise for prediabetes, and denies paresthesia of the feet, polydipsia, polyuria and visual disturbances. Last A1C in the office was:  Lab Results  Component Value Date   HGBA1C 5.4 05/13/2017   Patient is on Vitamin D supplement.   Lab Results  Component Value Date   VD25OH 50 05/13/2017   She uses klonopin for sleep at night.  BMI is There is no height or weight on file to calculate BMI., she is working on diet and exercise. Wt Readings from Last 3 Encounters:  05/13/17 151 lb 9.6 oz (68.8 kg)  10/10/16 153 lb 6.4 oz (69.6 kg)  04/03/16 149 lb 9.6 oz (67.9 kg)    Medication Review: Current Outpatient Prescriptions on File Prior to Visit  Medication Sig Dispense Refill  . atenolol (TENORMIN) 25 MG tablet TAKE 1/2 TO 1 TABLET BY  MOUTH DAILY FOR BLOOD  PRESSURE 90 tablet 1  . Calcium 1500 MG tablet Take 1,500 mg by mouth.    . cholecalciferol (VITAMIN D) 1000 UNITS tablet Take 1,000 Units by mouth daily.    . clomiPRAMINE (ANAFRANIL) 50 MG capsule Take 1 capsule (50 mg total) by mouth at bedtime.    . clonazePAM (KLONOPIN) 2 MG tablet     . ezetimibe (ZETIA) 10 MG tablet TAKE 1 TABLET BY MOUTH  EVERY DAY FOR CHOLESTEROL (Patient not taking: Reported on 05/13/2017) 90 tablet 1  . fish oil-omega-3 fatty acids 1000 MG capsule Take 1 g  by mouth daily.    . magnesium 30 MG tablet Take 30 mg by mouth 2 (two) times daily.    . mometasone (NASONEX) 50 MCG/ACT nasal spray Place 2 sprays into the nose daily. 17 g 2  . Multiple Vitamin (MULTIVITAMIN) tablet Take 1 tablet by mouth daily.    . Multiple Vitamins-Minerals (EYE VITAMINS PO) Take 1 capsule by mouth daily. ocusight    . triamcinolone cream (KENALOG) 0.1 % Apply topically 4 (four) times daily. 85.2 g 99  . vitamin C (ASCORBIC ACID) 500 MG tablet Take 500 mg by  mouth daily.     No current facility-administered medications on file prior to visit.     Current Problems (verified) Patient Active Problem List   Diagnosis Date Noted  . Medication management 11/10/2014  . Vitamin D deficiency 03/08/2014  . Hyperlipidemia   . Hypertension   . Prediabetes   . Osteopenia 02/25/2013  . ESOPHAGEAL STRICTURE 06/27/2010  . GERD 06/28/2009  . PERSONAL HX COLONIC POLYPS 06/28/2009    Screening Tests Immunization History  Administered Date(s) Administered  . Influenza, High Dose Seasonal PF 11/14/2014, 10/10/2016  . Pneumococcal Conjugate-13 09/21/2015  . Pneumococcal Polysaccharide-23 02/14/2009, 05/13/2017  . Td 12/11/2006  . Tdap 12/09/2005  . Zoster 02/27/2011   Preventative care: Last colonoscopy: 2010 MGM 08/2015 DEXA 2014 Pap: 2016  Prior vaccinations: TD or Tdap: 2007  Influenza: 2016  Pneumococcal: 2010 Prevnar13: 2016 Shingles/Zostavax: 2012  Names of Other Physician/Practitioners you currently use: 1. [facility] here for primary care 2. Dr. Katy Fitch, eye doctor, last visit 2016 3. Dr. Barrie Dunker, dentist, last visit 2016 Patient Care Team: Unk Pinto, MD as PCP - General (Internal Medicine) Clent Jacks, MD as Consulting Physician (Ophthalmology) Irene Shipper, MD as Consulting Physician (Gastroenterology) Milus Banister, MD as Attending Physician (Gastroenterology) Megan Salon, MD as Consulting Physician (Gynecology)  Allergies Allergies  Allergen Reactions  . Asa [Aspirin] Diarrhea  . Levaquin [Levofloxacin] Other (See Comments)    Joint Pain  . Macrolides And Ketolides Nausea And Vomiting  . Paxil [Paroxetine Hcl] Nausea And Vomiting  . Penicillins Hives  . Prednisone     Altered mental state  . Tetracyclines & Related Nausea And Vomiting  . Zoloft [Sertraline Hcl] Nausea And Vomiting  . Sulfa Antibiotics Rash  . Trimethoprim Rash    SURGICAL HISTORY She  has a  past surgical history that includes Tonsillectomy; Breast biopsy (Left); Cesarean section; Upper gastrointestinal endoscopy; Polypectomy; Colonoscopy; and eye lift. FAMILY HISTORY Her family history includes Colon cancer in her paternal aunt; Heart disease in her mother; Hypertension in her mother; Kidney cancer in her father. SOCIAL HISTORY She  reports that she has never smoked. She has never used smokeless tobacco. She reports that she does not drink alcohol or use drugs.  MEDICARE WELLNESS OBJECTIVES: Physical activity:   Cardiac risk factors:   Depression/mood screen:   Depression screen [facility] 2/9 05/13/2017  Decreased Interest 0  Down, Depressed, Hopeless 0  PHQ - 2 Score 0    ADLs:  In your present state of health, do you have any difficulty performing the following activities: 05/13/2017 10/12/2016  Hearing? N N  Vision? N N  Difficulty concentrating or making decisions? N N  Walking or climbing stairs? N N  Dressing or bathing? N N  Doing errands, shopping? N N  Some recent data might be hidden     Cognitive Testing  Alert? Yes  Normal Appearance?Yes  Oriented to  person? Yes  Place? Yes   Time? Yes  Recall of three objects?  Yes  Can perform simple calculations? Yes  Displays appropriate judgment?Yes  Can read the correct time from a watch face?Yes  EOL planning:     Objective:   There were no vitals filed for this visit. There is no height or weight on file to calculate BMI.  General appearance: alert, no distress, WD/WN, female HEENT: normocephalic, sclerae anicteric, TMs pearly, nares patent, no discharge or erythema, pharynx normal Oral cavity: MMM, no lesions Neck: supple, no lymphadenopathy, no thyromegaly, no masses Heart: RRR, normal S1, S2, no murmurs Lungs: CTA bilaterally, no wheezes, rhonchi, or rales Abdomen: +bs, soft, non tender, non distended, no masses, no hepatomegaly, no splenomegaly Musculoskeletal: nontender, no swelling, no obvious  deformity Extremities: no edema, no cyanosis, no clubbing Pulses: 2+ symmetric, upper and lower extremities, normal cap refill Neurological: alert, oriented x 3, CN2-12 intact, strength normal upper extremities and lower extremities, sensation normal throughout, DTRs 2+ throughout, no cerebellar signs, gait normal Psychiatric: normal affect, behavior normal, pleasant   Medicare Attestation I have personally reviewed: The patient's medical and social history Their use of alcohol, tobacco or illicit drugs Their current medications and supplements The patient's functional ability including ADLs,fall risks, home safety risks, cognitive, and hearing and visual impairment Diet and physical activities Evidence for depression or mood disorders  The patient's weight, height, BMI, and visual acuity have been recorded in the chart.  I have made referrals, counseling, and provided education to the patient based on review of the above and I have provided the patient with a written personalized care plan for preventive services.     Vicie Mutters, PA-C   09/01/2017

## 2017-09-02 ENCOUNTER — Ambulatory Visit: Payer: Self-pay | Admitting: Physician Assistant

## 2017-11-19 ENCOUNTER — Other Ambulatory Visit: Payer: Self-pay | Admitting: *Deleted

## 2017-11-19 MED ORDER — TRIAMCINOLONE ACETONIDE 0.1 % EX CREA: TOPICAL_CREAM | Freq: Four times a day (QID) | CUTANEOUS | 4 refills | Status: DC

## 2017-12-03 ENCOUNTER — Ambulatory Visit: Payer: Medicare Other | Admitting: Internal Medicine

## 2017-12-03 VITALS — BP 112/70 | HR 72 | Temp 97.3°F | Resp 16 | Ht 66.5 in | Wt 146.0 lb

## 2017-12-03 DIAGNOSIS — E782 Mixed hyperlipidemia: Secondary | ICD-10-CM

## 2017-12-03 DIAGNOSIS — R7309 Other abnormal glucose: Secondary | ICD-10-CM | POA: Diagnosis not present

## 2017-12-03 DIAGNOSIS — Z79899 Other long term (current) drug therapy: Secondary | ICD-10-CM | POA: Diagnosis not present

## 2017-12-03 DIAGNOSIS — R7303 Prediabetes: Secondary | ICD-10-CM

## 2017-12-03 DIAGNOSIS — E559 Vitamin D deficiency, unspecified: Secondary | ICD-10-CM

## 2017-12-03 DIAGNOSIS — I10 Essential (primary) hypertension: Secondary | ICD-10-CM

## 2017-12-03 DIAGNOSIS — Z23 Encounter for immunization: Secondary | ICD-10-CM | POA: Diagnosis not present

## 2017-12-03 NOTE — Progress Notes (Signed)
This very nice 71 y.o. MWF presents for 6 month follow up with Hypertension, Hyperlipidemia, Pre-Diabetes and Vitamin D Deficiency.      Patient is treated for HTN since the 1990's & BP has been controlled at home. Today's BP is at goal - 112/70. Patient has had no complaints of any cardiac type chest pain, palpitations, dyspnea / orthopnea / PND, dizziness, claudication, or dependent edema.     Hyperlipidemia is not controlled with diet as she has poor compliance with meds. Patient denies myalgias or other med SE's. Last Lipids were not at goal after she stopped her Zetia: Lab Results  Component Value Date   CHOL 224 (H) 05/13/2017   HDL 62 05/13/2017   LDLCALC 129 (H) 05/13/2017   TRIG 165 (H) 05/13/2017   CHOLHDL 3.6 05/13/2017      Also, the patient has history of PreDiabetes (A1c 5.7% / 2011) and has had no symptoms of reactive hypoglycemia, diabetic polys, paresthesias or visual blurring.  Last A1c was at goal: Lab Results  Component Value Date   HGBA1C 5.4 05/13/2017      Further, the patient also has history of Vitamin D Deficiency ("36"/2008) and supplements vitamin D without any suspected side-effects. Last vitamin D was still relatively low (goal 70-100): Lab Results  Component Value Date   VD25OH 50 05/13/2017   Current Outpatient Medications on File Prior to Visit  Medication Sig  . atenolol (TENORMIN) 25 MG tablet TAKE 1/2 TO 1 TABLET BY  MOUTH DAILY FOR BLOOD  PRESSURE  . Calcium 1500 MG tablet Take 1,500 mg by mouth.  . cholecalciferol (VITAMIN D) 1000 UNITS tablet Take 1,000 Units by mouth daily.  . clomiPRAMINE (ANAFRANIL) 50 MG capsule Take 1 capsule (50 mg total) by mouth at bedtime.  . clonazePAM (KLONOPIN) 2 MG tablet   . ezetimibe (ZETIA) 10 MG tablet TAKE 1 TABLET BY MOUTH  EVERY DAY FOR CHOLESTEROL  . fish oil-omega-3 fatty acids 1000 MG capsule Take 1 g by mouth daily.  . magnesium 30 MG tablet Take 30 mg by mouth 2 (two) times daily.  . Multiple  Vitamin (MULTIVITAMIN) tablet Take 1 tablet by mouth daily.  . Multiple Vitamins-Minerals (EYE VITAMINS PO) Take 1 capsule by mouth daily. ocusight  . triamcinolone cream (KENALOG) 0.1 % Apply topically 4 (four) times daily.  . vitamin C (ASCORBIC ACID) 500 MG tablet Take 500 mg by mouth daily.  . mometasone (NASONEX) 50 MCG/ACT nasal spray Place 2 sprays into the nose daily.   No current facility-administered medications on file prior to visit.    Allergies  Allergen Reactions  . Asa [Aspirin] Diarrhea  . Levaquin [Levofloxacin] Other (See Comments)    Joint Pain  . Macrolides And Ketolides Nausea And Vomiting  . Paxil [Paroxetine Hcl] Nausea And Vomiting  . Penicillins Hives  . Prednisone     Altered mental state  . Tetracyclines & Related Nausea And Vomiting  . Zoloft [Sertraline Hcl] Nausea And Vomiting  . Sulfa Antibiotics Rash  . Trimethoprim Rash   PMHx:   Past Medical History:  Diagnosis Date  . Anemia    mild  . Anxiety   . Colon polyp    hyperplastic  . Diverticulosis   . Esophageal stricture   . GERD (gastroesophageal reflux disease)    no s/s now- off medicines  . Hyperlipidemia   . Hypertension    denies now   . Palpitations   . Prediabetes   . Tachycardia  Immunization History  Administered Date(s) Administered  . Influenza, High Dose Seasonal PF 11/14/2014, 10/10/2016  . Pneumococcal Conjugate-13 09/21/2015  . Pneumococcal Polysaccharide-23 02/14/2009, 05/13/2017  . Td 12/11/2006  . Tdap 12/09/2005  . Zoster 02/27/2011   Past Surgical History:  Procedure Laterality Date  . BREAST BIOPSY Left   . CESAREAN SECTION     x1  . COLONOSCOPY    . eye lift    . POLYPECTOMY    . TONSILLECTOMY    . UPPER GASTROINTESTINAL ENDOSCOPY     FHx:    Reviewed / unchanged  SHx:    Reviewed / unchanged   Systems Review:  Constitutional: Denies fever, chills, wt changes, headaches, insomnia, fatigue, night sweats, change in appetite. Eyes: Denies  redness, blurred vision, diplopia, discharge, itchy, watery eyes.  ENT: Denies discharge, congestion, post nasal drip, epistaxis, sore throat, earache, hearing loss, dental pain, tinnitus, vertigo, sinus pain, snoring.  CV: Denies chest pain, palpitations, irregular heartbeat, syncope, dyspnea, diaphoresis, orthopnea, PND, claudication or edema. Respiratory: denies cough, dyspnea, DOE, pleurisy, hoarseness, laryngitis, wheezing.  Gastrointestinal: Denies dysphagia, odynophagia, heartburn, reflux, water brash, abdominal pain or cramps, nausea, vomiting, bloating, diarrhea, constipation, hematemesis, melena, hematochezia  or hemorrhoids. Genitourinary: Denies dysuria, frequency, urgency, nocturia, hesitancy, discharge, hematuria or flank pain. Musculoskeletal: Denies arthralgias, myalgias, stiffness, jt. swelling, pain, limping or strain/sprain.  Skin: Denies pruritus, rash, hives, warts, acne, eczema or change in skin lesion(s). Neuro: No weakness, tremor, incoordination, spasms, paresthesia or pain. Psychiatric: Denies confusion, memory loss or sensory loss. Endo: Denies change in weight, skin or hair change.  Heme/Lymph: No excessive bleeding, bruising or enlarged lymph nodes.  Physical Exam  BP 112/70   Pulse 72   Temp (!) 97.3 F (36.3 C)   Resp 16   Ht 5' 6.5" (1.689 m)   Wt 146 lb (66.2 kg)   LMP 12/09/1994   BMI 23.21 kg/m   Appears well nourished, well groomed  and in no distress.  Eyes: PERRLA, EOMs, conjunctiva no swelling or erythema. Sinuses: No frontal/maxillary tenderness ENT/Mouth: EAC's clear, TM's nl w/o erythema, bulging. Nares clear w/o erythema, swelling, exudates. Oropharynx clear without erythema or exudates. Oral hygiene is good. Tongue normal, non obstructing. Hearing intact.  Neck: Supple. Thyroid nl. Car 2+/2+ without bruits, nodes or JVD. Chest: Respirations nl with BS clear & equal w/o rales, rhonchi, wheezing or stridor.  Cor: Heart sounds normal w/  regular rate and rhythm without sig. murmurs, gallops, clicks or rubs. Peripheral pulses normal and equal  without edema.  Abdomen: Soft & bowel sounds normal. Non-tender w/o guarding, rebound, hernias, masses or organomegaly.  Lymphatics: Unremarkable.  Musculoskeletal: Full ROM all peripheral extremities, joint stability, 5/5 strength and normal gait.  Skin: Warm, dry without exposed rashes, lesions or ecchymosis apparent.  Neuro: Cranial nerves intact, reflexes equal bilaterally. Sensory-motor testing grossly intact. Tendon reflexes grossly intact.  Pysch: Alert & oriented x 3.  Insight and judgement nl & appropriate. No ideations.  Assessment and Plan:  1. Essential hypertension  - Continue medication, monitor blood pressure at home.  - Continue DASH diet. Reminder to go to the ER if any CP,  SOB, nausea, dizziness, severe HA, changes vision/speech. - CBC with Differential/Platelet - BASIC METABOLIC PANEL WITH GFR - Magnesium - TSH  2. Hyperlipidemia, mixed  - Continue diet/meds, exercise,& lifestyle modifications.  - Continue monitor periodic cholesterol/liver & renal functions  - Hepatic function panel - Lipid panel - TSH  3. Prediabetes  - Hemoglobin A1c - Insulin, random  4. Vitamin D deficiency  - VITAMIN D 25 Hydroxy  5. Abnormal glucose  - Hemoglobin A1c - Insulin, random  6. Medication management  - CBC with Differential/Platelet - BASIC METABOLIC PANEL WITH GFR - Hepatic function panel - Magnesium - Lipid panel - TSH - Hemoglobin A1c - Insulin, random - VITAMIN D 25 Hydroxy  - Continue diet, exercise, lifestyle modifications.  - Monitor appropriate labs. - Continue supplementation.       Discussed  regular exercise, BP monitoring, weight control to achieve/maintain BMI less than 25 and discussed med and SE's. Recommended labs to assess and monitor clinical status with further disposition pending results of labs. Over 30 minutes of exam,  counseling, chart review was performed.

## 2017-12-03 NOTE — Patient Instructions (Signed)

## 2017-12-04 ENCOUNTER — Encounter: Payer: Self-pay | Admitting: Internal Medicine

## 2017-12-04 LAB — CBC WITH DIFFERENTIAL/PLATELET
BASOS ABS: 58 {cells}/uL (ref 0–200)
Basophils Relative: 1 %
EOS ABS: 139 {cells}/uL (ref 15–500)
EOS PCT: 2.4 %
HEMATOCRIT: 40.8 % (ref 35.0–45.0)
HEMOGLOBIN: 13.7 g/dL (ref 11.7–15.5)
LYMPHS ABS: 2291 {cells}/uL (ref 850–3900)
MCH: 29.3 pg (ref 27.0–33.0)
MCHC: 33.6 g/dL (ref 32.0–36.0)
MCV: 87.4 fL (ref 80.0–100.0)
MPV: 9.7 fL (ref 7.5–12.5)
Monocytes Relative: 7.8 %
NEUTROS ABS: 2859 {cells}/uL (ref 1500–7800)
Neutrophils Relative %: 49.3 %
Platelets: 253 10*3/uL (ref 140–400)
RBC: 4.67 10*6/uL (ref 3.80–5.10)
RDW: 12.6 % (ref 11.0–15.0)
Total Lymphocyte: 39.5 %
WBC: 5.8 10*3/uL (ref 3.8–10.8)
WBCMIX: 452 {cells}/uL (ref 200–950)

## 2017-12-04 LAB — VITAMIN D 25 HYDROXY (VIT D DEFICIENCY, FRACTURES): Vit D, 25-Hydroxy: 49 ng/mL (ref 30–100)

## 2017-12-04 LAB — HEPATIC FUNCTION PANEL
AG RATIO: 2 (calc) (ref 1.0–2.5)
ALBUMIN MSPROF: 4.3 g/dL (ref 3.6–5.1)
ALKALINE PHOSPHATASE (APISO): 65 U/L (ref 33–130)
ALT: 13 U/L (ref 6–29)
AST: 10 U/L (ref 10–35)
BILIRUBIN TOTAL: 0.5 mg/dL (ref 0.2–1.2)
Bilirubin, Direct: 0.1 mg/dL (ref 0.0–0.2)
Globulin: 2.2 g/dL (calc) (ref 1.9–3.7)
Indirect Bilirubin: 0.4 mg/dL (calc) (ref 0.2–1.2)
Total Protein: 6.5 g/dL (ref 6.1–8.1)

## 2017-12-04 LAB — LIPID PANEL
CHOL/HDL RATIO: 2.7 (calc) (ref <5.0)
Cholesterol: 204 mg/dL — ABNORMAL HIGH (ref <200)
HDL: 75 mg/dL (ref >50)
LDL CHOLESTEROL (CALC): 108 mg/dL — AB
NON-HDL CHOLESTEROL (CALC): 129 mg/dL (ref <130)
TRIGLYCERIDES: 118 mg/dL (ref <150)

## 2017-12-04 LAB — BASIC METABOLIC PANEL WITH GFR
BUN: 12 mg/dL (ref 7–25)
CO2: 30 mmol/L (ref 20–32)
CREATININE: 0.82 mg/dL (ref 0.60–0.93)
Calcium: 9.8 mg/dL (ref 8.6–10.4)
Chloride: 105 mmol/L (ref 98–110)
GFR, EST NON AFRICAN AMERICAN: 72 mL/min/{1.73_m2} (ref >=60)
GFR, Est African American: 84 mL/min/{1.73_m2} (ref >=60)
Glucose, Bld: 91 mg/dL (ref 65–99)
Potassium: 5 mmol/L (ref 3.5–5.3)
SODIUM: 141 mmol/L (ref 135–146)

## 2017-12-04 LAB — HEMOGLOBIN A1C
Hgb A1c MFr Bld: 5.6 % of total Hgb (ref <5.7)
Mean Plasma Glucose: 114 (calc)
eAG (mmol/L): 6.3 (calc)

## 2017-12-04 LAB — TSH: TSH: 1.63 mIU/L (ref 0.40–4.50)

## 2017-12-04 LAB — INSULIN, RANDOM: INSULIN: 4.1 u[IU]/mL (ref 2.0–19.6)

## 2017-12-04 LAB — MAGNESIUM: Magnesium: 2.1 mg/dL (ref 1.5–2.5)

## 2017-12-04 NOTE — Addendum Note (Signed)
Addended by: Emelda Brothers on: 12/04/2017 08:44 AM   Modules accepted: Orders

## 2018-01-06 ENCOUNTER — Other Ambulatory Visit: Payer: Self-pay | Admitting: Internal Medicine

## 2018-02-25 ENCOUNTER — Ambulatory Visit: Payer: Medicare Other | Admitting: Physician Assistant

## 2018-02-25 ENCOUNTER — Ambulatory Visit (HOSPITAL_COMMUNITY)
Admission: RE | Admit: 2018-02-25 | Discharge: 2018-02-25 | Disposition: A | Discharge status: Home / Self Care | Payer: Medicare Other | Source: Ambulatory Visit | Attending: Physician Assistant | Admitting: Physician Assistant

## 2018-02-25 ENCOUNTER — Encounter: Payer: Self-pay | Admitting: Physician Assistant

## 2018-02-25 ENCOUNTER — Other Ambulatory Visit: Payer: Self-pay | Admitting: Internal Medicine

## 2018-02-25 VITALS — BP 116/72 | HR 71 | Temp 97.7°F | Resp 16 | Ht 66.5 in | Wt 149.4 lb

## 2018-02-25 DIAGNOSIS — R6889 Other general symptoms and signs: Secondary | ICD-10-CM | POA: Diagnosis not present

## 2018-02-25 DIAGNOSIS — Z0001 Encounter for general adult medical examination with abnormal findings: Secondary | ICD-10-CM | POA: Diagnosis not present

## 2018-02-25 DIAGNOSIS — J449 Chronic obstructive pulmonary disease, unspecified: Secondary | ICD-10-CM | POA: Insufficient documentation

## 2018-02-25 DIAGNOSIS — Z0000 Encounter for general adult medical examination without abnormal findings: Secondary | ICD-10-CM

## 2018-02-25 DIAGNOSIS — M8580 Other specified disorders of bone density and structure, unspecified site: Secondary | ICD-10-CM

## 2018-02-25 DIAGNOSIS — K219 Gastro-esophageal reflux disease without esophagitis: Secondary | ICD-10-CM | POA: Diagnosis not present

## 2018-02-25 DIAGNOSIS — R05 Cough: Secondary | ICD-10-CM

## 2018-02-25 DIAGNOSIS — K222 Esophageal obstruction: Secondary | ICD-10-CM | POA: Diagnosis not present

## 2018-02-25 DIAGNOSIS — R918 Other nonspecific abnormal finding of lung field: Secondary | ICD-10-CM | POA: Insufficient documentation

## 2018-02-25 DIAGNOSIS — R059 Cough, unspecified: Secondary | ICD-10-CM

## 2018-02-25 DIAGNOSIS — Z6823 Body mass index (BMI) 23.0-23.9, adult: Secondary | ICD-10-CM

## 2018-02-25 DIAGNOSIS — I10 Essential (primary) hypertension: Secondary | ICD-10-CM

## 2018-02-25 DIAGNOSIS — Z79899 Other long term (current) drug therapy: Secondary | ICD-10-CM | POA: Diagnosis not present

## 2018-02-25 DIAGNOSIS — E782 Mixed hyperlipidemia: Secondary | ICD-10-CM | POA: Diagnosis not present

## 2018-02-25 DIAGNOSIS — Z860100 Personal history of colon polyps, unspecified: Secondary | ICD-10-CM

## 2018-02-25 DIAGNOSIS — E559 Vitamin D deficiency, unspecified: Secondary | ICD-10-CM | POA: Diagnosis not present

## 2018-02-25 MED ORDER — ALBUTEROL SULFATE HFA 108 (90 BASE) MCG/ACT IN AERS: 2.0000 | INHALATION_SPRAY | Freq: Four times a day (QID) | RESPIRATORY_TRACT | 1 refills | Status: DC | PRN

## 2018-02-25 MED ORDER — DEXAMETHASONE SODIUM PHOSPHATE 100 MG/10ML IJ SOLN
10.0000 mg | Freq: Once | INTRAMUSCULAR | Status: AC
  Administered 2018-02-25: 10 mg via INTRAMUSCULAR

## 2018-02-25 MED ORDER — PROMETHAZINE-PHENYLEPH-CODEINE 6.25-5-10 MG/5ML PO SYRP: ORAL_SOLUTION | ORAL | 0 refills | Status: DC

## 2018-02-25 MED ORDER — AZITHROMYCIN 250 MG PO TABS: ORAL_TABLET | ORAL | 1 refills | Status: AC

## 2018-02-25 MED ORDER — IPRATROPIUM-ALBUTEROL 0.5-2.5 (3) MG/3ML IN SOLN
3.0000 mL | Freq: Once | RESPIRATORY_TRACT | Status: AC
  Administered 2018-02-25: 3 mL via RESPIRATORY_TRACT

## 2018-02-25 MED ORDER — PROMETHAZINE-DM 6.25-15 MG/5ML PO SYRP: 5.0000 mL | ORAL_SOLUTION | Freq: Four times a day (QID) | ORAL | 1 refills | Status: DC | PRN

## 2018-02-25 NOTE — Patient Instructions (Signed)
The majority of colds are caused by viruses and do not require antibiotics. Please read the rest of this hand out to learn more about the common cold and what you can do to help yourself as well as help prevent the over use of antibiotics.   COMMON COLD SIGNS AND SYMPTOMS - The common cold usually causes nasal congestion, runny nose, and sneezing. A sore throat may be present on the first day but usually resolves quickly. If a cough occurs, it generally develops on about the fourth or fifth day of symptoms, and is always the last thing to go away.   COMMON COLD COMPLICATIONS - In most cases, colds do not cause serious illness or complications. Most colds last for three to seven days, although many people continue to have symptoms (coughing, sneezing, congestion) for up to two weeks.  COMMON COLD TREATMENT - There is no specific treatment for the viruses that cause the common cold. Most treatments are aimed at relieving some of the symptoms of the cold, but do not shorten or cure the cold.   Antibiotics are not useful for treating the common cold; antibiotics are only used to treat illnesses caused by bacteria, not viruses. Unnecessary use of antibiotics for the treatment of the common cold can cause allergic reactions, diarrhea, or other gastrointestinal symptoms in some patients.   The symptoms of a cold will resolve over time, even without any treatment. People with underlying medical conditions and those who use other over-the-counter or prescription medications should speak with their healthcare provider or pharmacist to ensure that it is safe to use these treatments. The following are treatments that may reduce the symptoms caused by the common cold.  Nasal congestion - Decongestants are good for nasal congestion- if you feel very stuffy but no mucus is coming out, this is the medication that will help you the most.  Pseudoephedrine is a decongestant that can improve nasal congestion. Although a  prescription is not required, drugstores in the [place] keep pseudoephedrine behind the counter, so it must be requested from a pharmacist. If you have a heart condition or high blood pressure please use Coricidin BPH instead.   Runny nose - Antihistamines such as diphenhydramine (Benadryl), certazine (Zyrtec) which are best taking at night because they can make you tired OR loratadine (Claritin),  fexafinadine (Allegra) help with a runny nose.   Nasal sprays such an oxymetazoline (Afrin and others) may also give temporary relief of nasal congestion. However, these sprays should never be used for more than two to three days; use for more than three days use can worsen congestion.  Nasocort is now over the counter and can help decrease a runny nose. Please stop the medication if you have blurry vision or nose bleeds.   Sore throat and headache - Sore throat and headache are best treated with a mild pain reliever such as acetaminophen (Tylenol) or a non-steroidal anti-inflammatory agent such as ibuprofen or naproxen (Motrin or Aleve). These medications should be taken with food to prevent stomach problems. As well as gargling with warm water and salt.   Cough - Common cough medicine ingredients include guaifenesin and dextromethorphan; these are often combined with other medications in over-the-counter cold formulas. Often a cough is worse at night or first in the morning due to post nasal drip from you nose. You can try to sleep at an angle to decrease a cough.   Alternative treatments - Heated, humidified air can improve symptoms of nasal congestion  and runny nose, and causes few to no side effects. A number of alternative products, including vitamin C, doubling up on your vitamin D and herbal products such as echinacea, may help. Certain products, such as nasal gels that contain zinc (eg, Zicam), have been associated with a permanent loss of smell.  Antibiotics - Antibiotics should not be used to  treat an uncomplicated common cold. Often you need to give your body 7-10 days to fight off a common cold while treating the symptoms with the medications listed above. If after 7-10 days your symptoms are not improving, you are getting worse, you have shortness of breath, chest pain, a fever of over 103 you should seek medical help immediately.   PREVENTION IS THE BEST MEDICINE - Hand washing is an essential and highly effective way to prevent the spread of infection.  Alcohol-based hand rubs are a good alternative for disinfecting hands if a sink is not available.  Hands should be washed before preparing food and eating and after coughing, blowing the nose, or sneezing. While it is not always possible to limit contact with people who may be infected with a cold, touching the eyes, nose, or mouth after direct contact should be avoided when possible. Sneezing/coughing into the sleeve of one's clothing (at the inner elbow) is another means of containing sprays of saliva and secretions and does not contaminate the hands.   Community-Acquired Pneumonia, Adult Pneumonia is an infection of the lungs. One type of pneumonia can happen while a person is in a hospital. A different type can happen when a person is not in a hospital (community-acquired pneumonia). It is easy for this kind to spread from person to person. It can spread to you if you breathe near an infected person who coughs or sneezes. Some symptoms include:  A dry cough.  A wet (productive) cough.  Fever.  Sweating.  Chest pain.  Follow these instructions at home:  Take over-the-counter and prescription medicines only as told by your doctor. ? Only take cough medicine if you are losing sleep. ? If you were prescribed an antibiotic medicine, take it as told by your doctor. Do not stop taking the antibiotic even if you start to feel better.  Sleep with your head and neck raised (elevated). You can do this by putting a few pillows  under your head, or you can sleep in a recliner.  Do not use tobacco products. These include cigarettes, chewing tobacco, and e-cigarettes. If you need help quitting, ask your doctor.  Drink enough water to keep your pee (urine) clear or pale yellow. A shot (vaccine) can help prevent pneumonia. Shots are often suggested for:  People older than 71 years of age.  People older than 71 years of age: ? Who are having cancer treatment. ? Who have long-term (chronic) lung disease. ? Who have problems with their body's defense system (immune system).  You may also prevent pneumonia if you take these actions:  Get the flu (influenza) shot every year.  Go to the dentist as often as told.  Wash your hands often. If soap and water are not available, use hand sanitizer.  Contact a doctor if:  You have a fever.  You lose sleep because your cough medicine does not help. Get help right away if:  You are short of breath and it gets worse.  You have more chest pain.  Your sickness gets worse. This is very serious if: ? You are an older adult. ? Your  body's defense system is weak.  You cough up blood. This information is not intended to replace advice given to you by your health care provider. Make sure you discuss any questions you have with your health care provider. Document Released: 05/13/2008 Document Revised: 05/02/2016 Document Reviewed: 03/22/2015 Elsevier Interactive Patient Education  [employer].

## 2018-02-25 NOTE — Progress Notes (Signed)
MEDICARE ANNUAL WELLNESS VISIT AND FOLLOW UP Assessment:    BMI 23.0-23.9, adult Monitor, continue exercise  Encounter for Medicare annual wellness exam 1 year  Essential hypertension - continue medications, DASH diet, exercise and monitor at home. Call if greater than 130/80.   Mixed hyperlipidemia -continue medications, check lipids, decrease fatty foods, increase activity.   Medication management  PERSONAL HX COLONIC POLYPS UTD  Vitamin D deficiency Continue supplement  Osteopenia, unspecified location Monitor  ESOPHAGEAL STRICTURE Continue PPI/H2 blocker, diet discussed  Gastroesophageal reflux disease, esophagitis presence not specified Continue PPI/H2 blocker, diet discussed  Cough Get CXR,rule out pneuomnia -     dexamethasone (DECADRON) injection 10 mg -     ipratropium-albuterol (DUONEB) 0.5-2.5 (3) MG/3ML nebulizer solution 3 mL -     DG Chest 2 View; Future -     azithromycin (ZITHROMAX) 250 MG tablet; Take 2 tablets (500 mg) on  Day 1,  followed by 1 tablet (250 mg) once daily on Days 2 through 5. -     promethazine-dextromethorphan (PROMETHAZINE-DM) 6.25-15 MG/5ML syrup; Take 5 mLs by mouth 4 (four) times daily as needed for cough. -     albuterol (PROAIR HFA) 108 (90 Base) MCG/ACT inhaler; Inhale 2 puffs into the lungs every 6 (six) hours as needed for wheezing or shortness of breath.    Over 30 minutes of exam, counseling, chart review, and critical decision making was performed Future Appointments  Date Time Provider [place]  06/09/2018  3:00 PM Unk Pinto, MD [facility] None    Plan:   During the course of the visit the patient was educated and counseled about appropriate screening and preventive services including:    Pneumococcal vaccine   Influenza vaccine  Prevnar 13  Td vaccine  Screening electrocardiogram  Colorectal cancer screening  Diabetes screening  Glaucoma screening  Nutrition counseling      Subjective:  Donna Dillon is a 72 y.o. female who presents for Medicare Annual Wellness Visit and acute visit for ear pain, SOB, fever last night x Sunday.   She states 2 weeks ago, she had symptoms of cold but was feeling better but then got worse x Sunday. She has sinus congestion, SOB with cough/mucus, had fever last night, feeling weak, loss of taste, some gum/teeth pain. Chest pain with coughing only.   Her blood pressure has been controlled at home, today their BP is BP: 116/72 She does workout, walking treadmill 30 mins a day. She denies chest pain, shortness of breath, dizziness. She has history of palpitations, controlled with atenolol.  She is on cholesterol medication, on zetia, and denies myalgias. Her cholesterol is at goal. The cholesterol last visit was:   Lab Results  Component Value Date   CHOL 204 (H) 12/03/2017   HDL 75 12/03/2017   LDLCALC 108 (H) 12/03/2017   TRIG 118 12/03/2017   CHOLHDL 2.7 12/03/2017   She has been working on diet and exercise for prediabetes, and denies paresthesia of the feet, polydipsia, polyuria and visual disturbances. Last A1C in the office was:  Lab Results  Component Value Date   HGBA1C 5.6 12/03/2017   Patient is on Vitamin D supplement.   Lab Results  Component Value Date   VD25OH 49 12/03/2017   She uses klonopin for sleep at night. BMI is Body mass index is 23.75 kg/m., she is working on diet and exercise. Wt Readings from Last 3 Encounters:  02/25/18 149 lb 6.4 oz (67.8 kg)  12/03/17 146 lb (66.2 kg)  05/13/17 151 lb 9.6 oz (68.8 kg)      Medication Review: Current Outpatient Medications on File Prior to Visit  Medication Sig Dispense Refill  . atenolol (TENORMIN) 25 MG tablet TAKE 1/2 TO 1 TABLET BY  MOUTH DAILY FOR BLOOD  PRESSURE 90 tablet 1  . Calcium 1500 MG tablet Take 1,500 mg by mouth.    . cholecalciferol (VITAMIN D) 1000 UNITS tablet Take 1,000 Units by mouth daily.    . clomiPRAMINE (ANAFRANIL) 50  MG capsule Take 1 capsule (50 mg total) by mouth at bedtime.    . clonazePAM (KLONOPIN) 2 MG tablet     . ezetimibe (ZETIA) 10 MG tablet TAKE 1 TABLET BY MOUTH  EVERY DAY FOR CHOLESTEROL 90 tablet 1  . fish oil-omega-3 fatty acids 1000 MG capsule Take 1 g by mouth daily.    . magnesium 30 MG tablet Take 30 mg by mouth 2 (two) times daily.    . Multiple Vitamin (MULTIVITAMIN) tablet Take 1 tablet by mouth daily.    . Multiple Vitamins-Minerals (EYE VITAMINS PO) Take 1 capsule by mouth daily. ocusight    . triamcinolone cream (KENALOG) 0.1 % Apply topically 4 (four) times daily. 85.2 g 4  . vitamin C (ASCORBIC ACID) 500 MG tablet Take 500 mg by mouth daily.    . mometasone (NASONEX) 50 MCG/ACT nasal spray Place 2 sprays into the nose daily. 17 g 2   No current facility-administered medications on file prior to visit.     Current Problems (verified) Patient Active Problem List   Diagnosis Date Noted  . Medication management 11/10/2014  . Vitamin D deficiency 03/08/2014  . Hyperlipidemia   . Hypertension   . Prediabetes   . Osteopenia 02/25/2013  . ESOPHAGEAL STRICTURE 06/27/2010  . GERD 06/28/2009  . PERSONAL HX COLONIC POLYPS 06/28/2009    Screening Tests Immunization History  Administered Date(s) Administered  . Influenza, High Dose Seasonal PF 11/14/2014, 10/10/2016, 12/03/2017  . Pneumococcal Conjugate-13 09/21/2015  . Pneumococcal Polysaccharide-23 02/14/2009, 05/13/2017  . Td 12/11/2006, 12/03/2017  . Tdap 12/09/2005  . Zoster 02/27/2011   Preventative care: Last colonoscopy: 2017 Dr. Henrene Pastor [facility] 08/2015 DEXA 2014 Pap: 2016 declines another Thyroid scan 2008  Prior vaccinations: TD or Tdap: 2008  Influenza: 2018  Pneumococcal: 2018 Prevnar13: 2016 Shingles/Zostavax: 2012  Names of Other Physician/Practitioners you currently use: 1. [facility] here for primary care 2. Dr. Katy Fitch, eye doctor, last visit 2017 3. Dr. Barrie Dunker,  dentist, last visit fall 2018 Patient Care Team: Unk Pinto, MD as PCP - General (Internal Medicine) Clent Jacks, MD as Consulting Physician (Ophthalmology) Irene Shipper, MD as Consulting Physician (Gastroenterology) Milus Banister, MD as Attending Physician (Gastroenterology) Megan Salon, MD as Consulting Physician (Gynecology)  Allergies Allergies  Allergen Reactions  . Asa [Aspirin] Diarrhea  . Levaquin [Levofloxacin] Other (See Comments)    Joint Pain  . Macrolides And Ketolides Nausea And Vomiting  . Paxil [Paroxetine Hcl] Nausea And Vomiting  . Penicillins Hives  . Prednisone     Altered mental state  . Tetracyclines & Related Nausea And Vomiting  . Zoloft [Sertraline Hcl] Nausea And Vomiting  . Sulfa Antibiotics Rash  . Trimethoprim Rash    SURGICAL HISTORY She  has a past surgical history that includes Tonsillectomy; Breast biopsy (Left); Cesarean section; Upper gastrointestinal endoscopy; Polypectomy; Colonoscopy; and eye lift. FAMILY HISTORY Her family history includes Colon cancer in her paternal aunt; Heart disease in her mother; Hypertension in  her mother; Kidney cancer in her father. SOCIAL HISTORY She  reports that  has never smoked. she has never used smokeless tobacco. She reports that she does not drink alcohol or use drugs.  MEDICARE WELLNESS OBJECTIVES: Tobacco use: She does not smoke.  Patient is not a former smoker. Alcohol Current alcohol use: none Fall risk: Low Risk Diet: in general, a "healthy" diet   Physical activity:   Cardiac risk factors:   Depression/mood screen:   Depression screen [facility] 2/9 12/04/2017  Decreased Interest 0  Down, Depressed, Hopeless 0  PHQ - 2 Score 0    ADLs:  In your present state of health, do you have any difficulty performing the following activities: 12/04/2017 05/13/2017  Hearing? N N  Vision? N N  Difficulty concentrating or making decisions? N N  Walking or climbing stairs? N N  Dressing or  bathing? N N  Doing errands, shopping? N N  Some recent data might be hidden     Cognitive Testing  Alert? Yes  Normal Appearance?Yes  Oriented to person? Yes  Place? Yes   Time? Yes  Recall of three objects?  Yes  Can perform simple calculations? Yes  Displays appropriate judgment?Yes  Can read the correct time from a watch face?Yes  EOL planning: Does Patient Have a Medical Advance Directive?: No Would patient like information on creating a medical advance directive?: Yes (MAU/Ambulatory/Procedural Areas - Information given)   Objective:   Today's Vitals   02/25/18 1437  BP: 116/72  Pulse: 71  Resp: 16  Temp: 97.7 F (36.5 C)  SpO2: 97%  Weight: 149 lb 6.4 oz (67.8 kg)  Height: 5' 6.5" (1.689 m)  PainSc: 3   PainLoc: Ear   Body mass index is 23.75 kg/m.  General appearance: alert, no distress, WD/WN, female HEENT: normocephalic, sclerae anicteric, TMs pearly, nares patent, no discharge or erythema, pharynx normal Oral cavity: MMM, no lesions Neck: supple, no lymphadenopathy, no thyromegaly, no masses Heart: RRR, normal S1, S2, no murmurs Lungs: CTA bilaterally, no wheezes, rhonchi, or rales Abdomen: +bs, soft, non tender, non distended, no masses, no hepatomegaly, no splenomegaly Musculoskeletal: nontender, no swelling, no obvious deformity Extremities: no edema, no cyanosis, no clubbing Pulses: 2+ symmetric, upper and lower extremities, normal cap refill Neurological: alert, oriented x 3, CN2-12 intact, strength normal upper extremities and lower extremities, sensation normal throughout, DTRs 2+ throughout, no cerebellar signs, gait normal Psychiatric: normal affect, behavior normal, pleasant   Medicare Attestation I have personally reviewed: The patient's medical and social history Their use of alcohol, tobacco or illicit drugs Their current medications and supplements The patient's functional ability including ADLs,fall risks, home safety risks, cognitive,  and hearing and visual impairment Diet and physical activities Evidence for depression or mood disorders  The patient's weight, height, BMI, and visual acuity have been recorded in the chart.  I have made referrals, counseling, and provided education to the patient based on review of the above and I have provided the patient with a written personalized care plan for preventive services.     Vicie Mutters, PA-C   02/25/2018

## 2018-04-24 ENCOUNTER — Other Ambulatory Visit: Payer: Self-pay | Admitting: Internal Medicine

## 2018-04-24 MED ORDER — PROMETHAZINE-PHENYLEPHRINE 6.25-5 MG/5ML PO SYRP: ORAL_SOLUTION | ORAL | 1 refills | Status: DC

## 2018-04-24 MED ORDER — BENZONATATE 200 MG PO CAPS: ORAL_CAPSULE | ORAL | 1 refills | Status: DC

## 2018-04-24 MED ORDER — CEFUROXIME AXETIL 500 MG PO TABS: ORAL_TABLET | ORAL | 0 refills | Status: DC

## 2018-04-24 MED ORDER — DEXAMETHASONE 0.5 MG PO TABS
ORAL_TABLET | ORAL | 0 refills | Status: DC
Start: 2018-04-24 — End: 2018-05-06

## 2018-04-27 ENCOUNTER — Other Ambulatory Visit: Payer: Self-pay | Admitting: Internal Medicine

## 2018-04-28 ENCOUNTER — Ambulatory Visit (INDEPENDENT_AMBULATORY_CARE_PROVIDER_SITE_OTHER): Payer: Medicare Other | Admitting: Internal Medicine

## 2018-04-28 VITALS — BP 96/58 | HR 72 | Temp 97.5°F | Resp 18 | Ht 66.5 in | Wt 151.2 lb

## 2018-04-28 DIAGNOSIS — J40 Bronchitis, not specified as acute or chronic: Secondary | ICD-10-CM | POA: Diagnosis not present

## 2018-04-28 NOTE — Progress Notes (Signed)
Subjective:    Patient ID: Donna Dillon, female    DOB: 06-15-1946, 72 y.o.   MRN: [id number]  HPI  Patient is a very nice 72 yo MWF recently had been treated in March for a LLL PNA which had resolved resolved.  Then about 4 days ago had sx's of head & chest congestion  And was treated w Ceftin, Decadron and Benzonatate and Phenergan /DM syrup for a 10 day course.  She reports improvement over the interim. She denies fever, chills, sweats rash or dyspnea. Cough is minimally productive.   Medication Sig  . PROAIR HFA inhaler 2 puffs  every 6 (six) hours as needed   . atenolol 25 MG tablet TAKE 1/2 TO 1 TABLET BY  MOUTH DAILY FOR BLOOD  PRESSURE  . benzonatate 200 MG cap Take 1 perle 3 x / day to prevent cough  . Calcium 1500 MG tablet Take 1,500 mg by mouth.  . CEFTIN 500 MG tablet Take 1 tablet 2 x / day with food for infection  . VITAMIN D 1000 UNITS  Take 1,000 Units by mouth daily.  . ANAFRANIL 50 MG cap Take 1 capsule (50 mg total) by mouth at bedtime.  . clonazePAM  2 MG tablet   . ezetimibe  10 MG tablet TAKE 1 TABLET BY MOUTH  EVERY DAY FOR CHOLESTEROL  . fish oil-omega-3  1000 MG  Take 1 g by mouth daily.  . magnesium 30 MG  Take 30 mg by mouth 2 (two) times daily.  . Multiple Vitamin  Take 1 tablet by mouth daily.  Marland Kitchen EYE VITAMINS Take 1 capsule by mouth daily. ocusight  . promethazine-phenylephrine  6.25-5 MG/5ML SYRP Take 1 to 2 teaspoonfuls every 4 hours for cough & congestion  . triamcinolone crm 0.1 % Apply topically 4 (four) times daily.  . vitamin C  500 MG tablet Take 500 mg by mouth daily.  Marland Kitchen dexamethasone 0.5 MG t Patient not taking: Reported on 04/28/2018)  . NASONEX   nasal spray Place 2 sprays into the nose daily.   Allergies  Allergen Reactions  . Asa [Aspirin] Diarrhea  . Levaquin [Levofloxacin] Other (See Comments)    Joint Pain  . Macrolides And Ketolides Nausea And Vomiting  . Paxil [Paroxetine Hcl] Nausea And Vomiting  . Penicillins Hives  . Prednisone      Altered mental state  . Tetracyclines & Related Nausea And Vomiting  . Zoloft [Sertraline Hcl] Nausea And Vomiting  . Sulfa Antibiotics Rash  . Trimethoprim Rash  \ Past Medical History:  Diagnosis Date  . Anemia    mild  . Anxiety   . Colon polyp    hyperplastic  . Diverticulosis   . Esophageal stricture   . GERD (gastroesophageal reflux disease)    no s/s now- off medicines  . Hyperlipidemia   . Hypertension    denies now   . Palpitations   . Prediabetes   . Tachycardia    10 point systems review negative except as above.    Objective:   Physical Exam  BP (!) 96/58   Pulse 72   Temp (!) 97.5 F (36.4 C)   Resp 18   Ht 5' 6.5" (1.689 m)   Wt 151 lb 3.2 oz (68.6 kg)   LMP 12/09/1994   BMI 24.04 kg/m   Dry Cough. No stridor.   HEENT - WNL. Neck - supple.  Chest - Few scattered dry rales and few  forced post tussive end expiratory  wheezes.. Cor - Nl HS. RRR w/o  m. PP 1(+). No edema. MS- FROM w/o deformities.  Gait Nl. Neuro -  Nl w/o focal abnormalities. Shin - clear w/o rash    Assessment & Plan:    1. Tracheobronchitis  - advised to complete the 10 day course of medicine that she was prescribed &  encouraged her to take the Rx of Decadron.   - Discussed meds & SE's & ROV prn not improve

## 2018-05-04 ENCOUNTER — Encounter: Payer: Self-pay | Admitting: Internal Medicine

## 2018-05-06 ENCOUNTER — Ambulatory Visit: Payer: Medicare Other | Admitting: Internal Medicine

## 2018-05-06 ENCOUNTER — Encounter: Payer: Self-pay | Admitting: Internal Medicine

## 2018-05-06 ENCOUNTER — Ambulatory Visit (HOSPITAL_COMMUNITY)
Admission: RE | Admit: 2018-05-06 | Discharge: 2018-05-06 | Disposition: A | Discharge status: Home / Self Care | Payer: Medicare Other | Source: Ambulatory Visit | Attending: Internal Medicine | Admitting: Internal Medicine

## 2018-05-06 VITALS — BP 109/67 | HR 60 | Temp 97.9°F | Resp 16 | Ht 66.5 in | Wt 152.0 lb

## 2018-05-06 DIAGNOSIS — J40 Bronchitis, not specified as acute or chronic: Secondary | ICD-10-CM

## 2018-05-06 DIAGNOSIS — J189 Pneumonia, unspecified organism: Secondary | ICD-10-CM

## 2018-05-06 DIAGNOSIS — J449 Chronic obstructive pulmonary disease, unspecified: Secondary | ICD-10-CM | POA: Insufficient documentation

## 2018-05-06 DIAGNOSIS — J181 Lobar pneumonia, unspecified organism: Secondary | ICD-10-CM | POA: Diagnosis not present

## 2018-05-06 NOTE — Progress Notes (Signed)
Subjective:    Patient ID: Donna Dillon, female    DOB: 1946/06/25, 72 y.o.   MRN: [id number]  HPI  This nice 72 yo MWF is seen in 1 week f/u after treatment for a tracheobronchitis with Ceftin (due to hx/ of mu;ltiple antibiotic allergies). Patient was treated in March for a small LLL PNA. Patient reports sx's have essentially resolved with expectoration of only scant amounts of a clear sputum.  Medication Sig  . albuterol  HFA inhaler Inhale 2 puffs into the lungs every 6 (six) hours as needed for wheezing or shortness of breath.  Marland Kitchen atenolol25 MG tablet TAKE 1/2 TO 1 TAB  DAILY FOR BLOOD  PRESSURE  . benzonatate 200 MG capsule Take 1 perle 3 x / day to prevent cough  . Calcium 1500 MG tablet Take 1,500 mg by mouth.  Marland Kitchen VITAMIN D 1000 UNITS  Take 1,000 Units by mouth daily.  . ANAFRANIL 50 MG capsule Take 1 capsule (50 mg total) by mouth at bedtime.  . clonazePAM  2 MG tablet   . ezetimibe  10 MG tablet TAKE 1 TABLET BY MOUTH  EVERY DAY FOR CHOLESTEROL  . fish oil-omega-3  1000 MG  Take 1 g by mouth daily.  . magnesium 30 MG tablet Take 30 mg by mouth 2 (two) times daily.  . Multiple Vitamin (MULTIVITAMIN) tablet Take 1 tablet by mouth daily.  . Multiple Vitamins-Minerals (EYE VITAMINS PO) Take 1 capsule by mouth daily. ocusight  . promethazine-phenylephrine syrp Take 1 to 2 teaspoonfuls every 4 hours for cough & congestion  . triamcinolone cream  0.1 % Apply topically 4 (four) times daily.  . vitamin C  500 MG tablet Take 500 mg by mouth daily.  . Mometasone  nasal spray Place 2 sprays into the nose daily.  Marland Kitchen CEFTIN 500 MG tablet Take 1 tablet 2 x / day with food for infection completing 10 day course  . dexamethasone  0.5 MG tablet Take 3 tablets now , then 1 tab 3 x day - 2 days, then 2 x day - 3 days, then 1 tab daily (Patient not taking: Reported on 04/28/2018)   Allergies  Allergen Reactions  . Asa [Aspirin] Diarrhea  . Levaquin [Levofloxacin] Other (See Comments)    Joint  Pain  . Macrolides And Ketolides Nausea And Vomiting  . Paxil [Paroxetine Hcl] Nausea And Vomiting  . Penicillins Hives  . Prednisone     Altered mental state  . Tetracyclines & Related Nausea And Vomiting  . Zoloft [Sertraline Hcl] Nausea And Vomiting  . Sulfa Antibiotics Rash  . Trimethoprim Rash   Past Medical History:  Diagnosis Date  . Anemia    mild  . Anxiety   . Colon polyp    hyperplastic  . Diverticulosis   . Esophageal stricture   . GERD (gastroesophageal reflux disease)    no s/s now- off medicines  . Hyperlipidemia   . Hypertension    denies now   . Palpitations   . Prediabetes   . Tachycardia    Review of Systems  10 point systems review negative except as above.          Objective:   Physical Exam  BP 109/67   Pulse 60   Temp 97.9 F (36.6 C)   Resp 16   Ht 5' 6.5" (1.689 m)   Wt 152 lb (68.9 kg)   LMP 12/09/1994   BMI 24.17 kg/m   HEENT - WNL. Neck -  supple.  Chest - Clear equal BS. Cor - Nl HS. RRR w/o sig MGR. PP 1(+). No edema. MS- FROM w/o deformities.  Gait Nl. Neuro -  Nl w/o focal abnormalities.    Assessment & Plan:   1. Pneumonia of left lower lobe due to infectious organism [facility])  - DG Chest 2 View  - encouraged to complete Abx's and discussed meds & SE's .

## 2018-06-09 ENCOUNTER — Ambulatory Visit: Payer: Medicare Other | Admitting: Internal Medicine

## 2018-06-09 VITALS — BP 106/70 | HR 76 | Temp 97.6°F | Resp 16 | Ht 66.5 in | Wt 152.8 lb

## 2018-06-09 DIAGNOSIS — E782 Mixed hyperlipidemia: Secondary | ICD-10-CM

## 2018-06-09 DIAGNOSIS — Z0001 Encounter for general adult medical examination with abnormal findings: Secondary | ICD-10-CM

## 2018-06-09 DIAGNOSIS — R7303 Prediabetes: Secondary | ICD-10-CM

## 2018-06-09 DIAGNOSIS — Z0000 Encounter for general adult medical examination without abnormal findings: Secondary | ICD-10-CM | POA: Diagnosis not present

## 2018-06-09 DIAGNOSIS — Z136 Encounter for screening for cardiovascular disorders: Secondary | ICD-10-CM

## 2018-06-09 DIAGNOSIS — E559 Vitamin D deficiency, unspecified: Secondary | ICD-10-CM

## 2018-06-09 DIAGNOSIS — R7309 Other abnormal glucose: Secondary | ICD-10-CM

## 2018-06-09 DIAGNOSIS — Z1211 Encounter for screening for malignant neoplasm of colon: Secondary | ICD-10-CM

## 2018-06-09 DIAGNOSIS — Z1212 Encounter for screening for malignant neoplasm of rectum: Secondary | ICD-10-CM

## 2018-06-09 DIAGNOSIS — Z8249 Family history of ischemic heart disease and other diseases of the circulatory system: Secondary | ICD-10-CM

## 2018-06-09 DIAGNOSIS — Z79899 Other long term (current) drug therapy: Secondary | ICD-10-CM

## 2018-06-09 DIAGNOSIS — I10 Essential (primary) hypertension: Secondary | ICD-10-CM

## 2018-06-09 NOTE — Patient Instructions (Signed)

## 2018-06-09 NOTE — Progress Notes (Signed)
[facility] Unk Pinto, M.D.     Uvaldo Bristle. Silverio Lay, P.A.-C Liane Comber, [place] [address] [place], [place] [address] Telephone [phone] Telefax [phone] Annual Screening/Preventative Visit & Comprehensive Evaluation &  Examination     This very nice 72 y.o. MWF presents for a Screening /Preventative Visit & comprehensive evaluation and management of multiple medical co-morbidities.  Patient has been followed for HTN, HLD, Prediabetes  and Vitamin D Deficiency. Patient has a long hx/o poor compliance.      Patient has been followed for labile HTN since the 1990's. Patient denies any cardiac symptoms as chest pain, palpitations, shortness of breath, dizziness or ankle swelling. Today's BP is at goal - 106/70.      Patient's hyperlipidemia is not  controlled with diet as she has stopped her Zetia. Last lipids were near, but not at goal: Lab Results  Component Value Date   CHOL 204 (H) 12/03/2017   HDL 75 12/03/2017   LDLCALC 108 (H) 12/03/2017   TRIG 118 12/03/2017   CHOLHDL 2.7 12/03/2017      Patient has hx/o prediabetes (A1c5.8%/2011) and patient denies reactive hypoglycemic symptoms, visual blurring, diabetic polys, or paresthesias. Last A1c was at goal: Lab Results  Component Value Date   HGBA1C 5.6 12/03/2017      Finally, patient has history of Vitamin D Deficiency("36" in 2008) and last Vitamin D was not at goal (70-100): Lab Results  Component Value Date   VD25OH 49 12/03/2017   Current Outpatient Medications on File Prior to Visit  Medication Sig  . albuterol (PROAIR HFA) 108 (90 Base) MCG/ACT inhaler Inhale 2 puffs into the lungs every 6 (six) hours as needed for wheezing or shortness of breath.  Marland Kitchen atenolol (TENORMIN) 25 MG tablet TAKE 1/2 TO 1 TABLET BY  MOUTH DAILY FOR BLOOD  PRESSURE  . benzonatate (TESSALON) 200 MG capsule Take 1 perle 3 x / day to prevent cough  .  Calcium 1500 MG tablet Take 1,500 mg by mouth.  . cholecalciferol (VITAMIN D) 1000 UNITS tablet Take 1,000 Units by mouth daily.  . clomiPRAMINE (ANAFRANIL) 50 MG capsule Take 1 capsule (50 mg total) by mouth at bedtime.  . clonazePAM (KLONOPIN) 2 MG tablet   . ezetimibe (ZETIA) 10 MG tablet TAKE 1 TABLET BY MOUTH  EVERY DAY FOR CHOLESTEROL  . fish oil-omega-3 fatty acids 1000 MG capsule Take 1 g by mouth daily.  . magnesium 30 MG tablet Take 30 mg by mouth 2 (two) times daily.  . Multiple Vitamin (MULTIVITAMIN) tablet Take 1 tablet by mouth daily.  . Multiple Vitamins-Minerals (EYE VITAMINS PO) Take 1 capsule by mouth daily. ocusight  . promethazine-phenylephrine (PROMETHAZINE-PHENYLEPHRINE) 6.25-5 MG/5ML SYRP Take 1 to 2 teaspoonfuls every 4 hours for cough & congestion  . triamcinolone cream (KENALOG) 0.1 % Apply topically 4 (four) times daily.  . vitamin C (ASCORBIC ACID) 500 MG tablet Take 500 mg by mouth daily.   No current facility-administered medications on file prior to visit.    Allergies  Allergen Reactions  . Asa [Aspirin] Diarrhea  . Levaquin [Levofloxacin] Other (See Comments)    Joint Pain  . Macrolides And Ketolides Nausea And Vomiting  . Paxil [Paroxetine Hcl] Nausea And Vomiting  . Penicillins Hives  . Prednisone     Altered mental state  . Tetracyclines & Related Nausea And Vomiting  . Zoloft [Sertraline Hcl] Nausea And Vomiting  . Sulfa Antibiotics Rash  . Trimethoprim  Rash   Past Medical History:  Diagnosis Date  . Anemia    mild  . Anxiety   . Colon polyp    hyperplastic  . Diverticulosis   . Esophageal stricture   . GERD (gastroesophageal reflux disease)    no s/s now- off medicines  . Hyperlipidemia   . Hypertension    denies now   . Palpitations   . Prediabetes   . Tachycardia    Health Maintenance  Topic Date Due  . Hepatitis C Screening  05/18/1946  . MAMMOGRAM  08/10/2017  . INFLUENZA VACCINE  07/09/2018  . COLONOSCOPY  03/05/2021  .  TETANUS/TDAP  12/04/2027  . DEXA SCAN  Completed  . PNA vac Low Risk Adult  Completed   Immunization History  Administered Date(s) Administered  . Influenza, High Dose Seasonal PF 11/14/2014, 10/10/2016, 12/03/2017  . Pneumococcal Conjugate-13 09/21/2015  . Pneumococcal Polysaccharide-23 02/14/2009, 05/13/2017  . Td 12/11/2006, 12/03/2017  . Tdap 12/09/2005  . Zoster 02/27/2011   Last Colon - 03/05/2016 - Dr Henrene Pastor recc 5 yr f/u - due Mar 2022  Last MGM - 08/11/2015 at [facility] - Dr Edwinna Areola  Past Surgical History:  Procedure Laterality Date  . BREAST BIOPSY Left   . CESAREAN SECTION     x1  . COLONOSCOPY    . eye lift    . POLYPECTOMY    . TONSILLECTOMY    . UPPER GASTROINTESTINAL ENDOSCOPY     Family History  Problem Relation Age of Onset  . Kidney cancer Father   . Hypertension Mother   . Heart disease Mother   . Colon cancer Paternal Aunt   . Colon polyps Neg Hx   . Esophageal cancer Neg Hx   . Rectal cancer Neg Hx   . Stomach cancer Neg Hx    Social History   Tobacco Use  . Smoking status: Never Smoker  . Smokeless tobacco: Never Used  Substance Use Topics  . Alcohol use: No    Alcohol/week: 0.0 oz  . Drug use: No    ROS Constitutional: Denies fever, chills, weight loss/gain, headaches, insomnia,  night sweats, and change in appetite. Does c/o fatigue. Eyes: Denies redness, blurred vision, diplopia, discharge, itchy, watery eyes.  ENT: Denies discharge, congestion, post nasal drip, epistaxis, sore throat, earache, hearing loss, dental pain, Tinnitus, Vertigo, Sinus pain, snoring.  Cardio: Denies chest pain, palpitations, irregular heartbeat, syncope, dyspnea, diaphoresis, orthopnea, PND, claudication, edema Respiratory: denies cough, dyspnea, DOE, pleurisy, hoarseness, laryngitis, wheezing.  Gastrointestinal: Denies dysphagia, heartburn, reflux, water brash, pain, cramps, nausea, vomiting, bloating, diarrhea, constipation, hematemesis, melena, hematochezia,  jaundice, hemorrhoids Genitourinary: Denies dysuria, frequency, urgency, nocturia, hesitancy, discharge, hematuria, flank pain Breast: Breast lumps, nipple discharge, bleeding.  Musculoskeletal: Denies arthralgia, myalgia, stiffness, Jt. Swelling, pain, limp, and strain/sprain. Denies falls. Skin: Denies puritis, rash, hives, warts, acne, eczema, changing in skin lesion Neuro: No weakness, tremor, incoordination, spasms, paresthesia, pain Psychiatric: Denies confusion, memory loss, sensory loss. Denies Depression. Endocrine: Denies change in weight, skin, hair change, nocturia, and paresthesia, diabetic polys, visual blurring, hyper / hypo glycemic episodes.  Heme/Lymph: No excessive bleeding, bruising, enlarged lymph nodes.  Physical Exam  BP 106/70   Pulse 76   Temp 97.6 F (36.4 C)   Resp 16   Ht 5' 6.5" (1.689 m)   Wt 152 lb 12.8 oz (69.3 kg)   LMP 12/09/1994   BMI 24.29 kg/m   General Appearance: Well nourished, well groomed and in no apparent distress.  Eyes: PERRLA,  EOMs, conjunctiva no swelling or erythema, normal fundi and vessels. Sinuses: No frontal/maxillary tenderness ENT/Mouth: EACs patent / TMs  nl. Nares clear without erythema, swelling, mucoid exudates. Oral hygiene is good. No erythema, swelling, or exudate. Tongue normal, non-obstructing. Tonsils not swollen or erythematous. Hearing normal.  Neck: Supple, thyroid not palpable. No bruits, nodes or JVD. Respiratory: Respiratory effort normal.  BS equal and clear bilateral without rales, rhonci, wheezing or stridor. Cardio: Heart sounds are normal with regular rate and rhythm and no murmurs, rubs or gallops. Peripheral pulses are normal and equal bilaterally without edema. No aortic or femoral bruits. Chest: symmetric with normal excursions and percussion. Breasts: Symmetric, without lumps, nipple discharge, retractions, or fibrocystic changes.  Abdomen: Flat, soft with bowel sounds active. Nontender, no guarding,  rebound, hernias, masses, or organomegaly.  Lymphatics: Non tender without lymphadenopathy.  Genitourinary:  Musculoskeletal: Full ROM all peripheral extremities, joint stability, 5/5 strength, and normal gait. Skin: Warm and dry without rashes, lesions, cyanosis, clubbing or  ecchymosis.  Neuro: Cranial nerves intact, reflexes equal bilaterally. Normal muscle tone, no cerebellar symptoms. Sensation intact.  Pysch: Alert and oriented X 3, normal affect, Insight and Judgment appropriate.   Assessment and Plan  1. Annual Preventative Screening Examination  2. Essential hypertension  - EKG 12-Lead - Urinalysis, Routine w reflex microscopic - CBC with Differential/Platelet - COMPLETE METABOLIC PANEL WITH GFR - Magnesium - TSH - Microalbumin / creatinine urine ratio  3. Hyperlipidemia, mixed  - EKG 12-Lead - Lipid panel - TSH  4. Abnormal glucose  - EKG 12-Lead - Hemoglobin A1c - Insulin, random  5. Vitamin D deficiency  - VITAMIN D 25 Hydroxyl  6. Prediabetes  - EKG 12-Lead - Hemoglobin A1c - Insulin, random  7. Screening for colorectal cancer  - POC Hemoccult Bld/Stl  8. Screening for ischemic heart disease  - EKG 12-Lead  9. FHx: heart disease  - EKG 12-Lead  10. Medication management  - Urinalysis, Routine w reflex microscopic - CBC with Differential/Platelet - COMPLETE METABOLIC PANEL WITH GFR - Magnesium - Lipid panel - TSH - Hemoglobin A1c - Insulin, random - Microalbumin / creatinine urine ratio              Patient was counseled in prudent diet to achieve/maintain BMI less than 25 for weight control, BP monitoring, regular exercise and medications. Discussed med's effects and SE's. Screening labs and tests as requested with regular follow-up as recommended. Over 40 minutes of exam, counseling, chart review and high complex critical decision making was performed.

## 2018-06-10 LAB — COMPLETE METABOLIC PANEL WITH GFR
AG Ratio: 2.1 (calc) (ref 1.0–2.5)
ALBUMIN MSPROF: 4.4 g/dL (ref 3.6–5.1)
ALKALINE PHOSPHATASE (APISO): 65 U/L (ref 33–130)
ALT: 15 U/L (ref 6–29)
AST: 10 U/L (ref 10–35)
BILIRUBIN TOTAL: 0.4 mg/dL (ref 0.2–1.2)
BUN: 12 mg/dL (ref 7–25)
CHLORIDE: 104 mmol/L (ref 98–110)
CO2: 28 mmol/L (ref 20–32)
Calcium: 9.3 mg/dL (ref 8.6–10.4)
Creat: 0.8 mg/dL (ref 0.60–0.93)
GFR, Est African American: 86 mL/min/{1.73_m2} (ref >=60)
GFR, Est Non African American: 74 mL/min/{1.73_m2} (ref >=60)
GLUCOSE: 141 mg/dL — AB (ref 65–99)
Globulin: 2.1 g/dL (calc) (ref 1.9–3.7)
Potassium: 4.1 mmol/L (ref 3.5–5.3)
Sodium: 139 mmol/L (ref 135–146)
Total Protein: 6.5 g/dL (ref 6.1–8.1)

## 2018-06-10 LAB — CBC WITH DIFFERENTIAL/PLATELET
BASOS PCT: 0.8 %
Basophils Absolute: 50 cells/uL (ref 0–200)
Eosinophils Absolute: 273 cells/uL (ref 15–500)
Eosinophils Relative: 4.4 %
HCT: 41.2 % (ref 35.0–45.0)
Hemoglobin: 13.6 g/dL (ref 11.7–15.5)
Lymphs Abs: 2046 cells/uL (ref 850–3900)
MCH: 29.2 pg (ref 27.0–33.0)
MCHC: 33 g/dL (ref 32.0–36.0)
MCV: 88.4 fL (ref 80.0–100.0)
MPV: 9.9 fL (ref 7.5–12.5)
Monocytes Relative: 7.2 %
Neutro Abs: 3385 cells/uL (ref 1500–7800)
Neutrophils Relative %: 54.6 %
PLATELETS: 244 10*3/uL (ref 140–400)
RBC: 4.66 10*6/uL (ref 3.80–5.10)
RDW: 13.3 % (ref 11.0–15.0)
TOTAL LYMPHOCYTE: 33 %
WBC: 6.2 10*3/uL (ref 3.8–10.8)
WBCMIX: 446 {cells}/uL (ref 200–950)

## 2018-06-10 LAB — MICROALBUMIN / CREATININE URINE RATIO
CREATININE, URINE: 34 mg/dL (ref 20–275)
Microalb Creat Ratio: 6 mcg/mg creat (ref <30)
Microalb, Ur: 0.2 mg/dL

## 2018-06-10 LAB — URINALYSIS, ROUTINE W REFLEX MICROSCOPIC
BILIRUBIN URINE: NEGATIVE
Glucose, UA: NEGATIVE
HGB URINE DIPSTICK: NEGATIVE
KETONES UR: NEGATIVE
Leukocytes, UA: NEGATIVE
NITRITE: NEGATIVE
PROTEIN: NEGATIVE
Specific Gravity, Urine: 1.009 (ref 1.001–1.03)
pH: 8 (ref 5.0–8.0)

## 2018-06-10 LAB — INSULIN, RANDOM: INSULIN: 32.8 u[IU]/mL — AB (ref 2.0–19.6)

## 2018-06-10 LAB — HEMOGLOBIN A1C
HEMOGLOBIN A1C: 5.4 %{Hb} (ref <5.7)
Mean Plasma Glucose: 108 (calc)
eAG (mmol/L): 6 (calc)

## 2018-06-10 LAB — LIPID PANEL
CHOLESTEROL: 191 mg/dL (ref <200)
HDL: 68 mg/dL (ref >50)
LDL Cholesterol (Calc): 103 mg/dL (calc) — ABNORMAL HIGH
Non-HDL Cholesterol (Calc): 123 mg/dL (calc) (ref <130)
TRIGLYCERIDES: 108 mg/dL (ref <150)
Total CHOL/HDL Ratio: 2.8 (calc) (ref <5.0)

## 2018-06-10 LAB — VITAMIN D 25 HYDROXY (VIT D DEFICIENCY, FRACTURES): VIT D 25 HYDROXY: 46 ng/mL (ref 30–100)

## 2018-06-10 LAB — TSH: TSH: 1.95 mIU/L (ref 0.40–4.50)

## 2018-06-10 LAB — MAGNESIUM: MAGNESIUM: 2.1 mg/dL (ref 1.5–2.5)

## 2018-06-13 ENCOUNTER — Encounter: Payer: Self-pay | Admitting: Internal Medicine

## 2018-06-15 LAB — HM MAMMOGRAPHY

## 2018-06-22 ENCOUNTER — Encounter: Payer: Self-pay | Admitting: *Deleted

## 2018-07-06 ENCOUNTER — Other Ambulatory Visit: Payer: Self-pay

## 2018-07-06 DIAGNOSIS — Z1211 Encounter for screening for malignant neoplasm of colon: Secondary | ICD-10-CM | POA: Diagnosis not present

## 2018-07-06 DIAGNOSIS — Z1212 Encounter for screening for malignant neoplasm of rectum: Principal | ICD-10-CM

## 2018-07-06 LAB — POC HEMOCCULT BLD/STL (HOME/3-CARD/SCREEN)
Card #2 Fecal Occult Blod, POC: NEGATIVE
Card #3 Fecal Occult Blood, POC: NEGATIVE
FECAL OCCULT BLD: NEGATIVE

## 2018-08-08 ENCOUNTER — Other Ambulatory Visit: Payer: Self-pay | Admitting: Internal Medicine

## 2018-08-22 DIAGNOSIS — F428 Other obsessive-compulsive disorder: Secondary | ICD-10-CM

## 2018-08-22 DIAGNOSIS — F429 Obsessive-compulsive disorder, unspecified: Secondary | ICD-10-CM | POA: Insufficient documentation

## 2018-09-10 ENCOUNTER — Ambulatory Visit: Payer: Self-pay | Admitting: Psychiatry

## 2018-09-16 NOTE — Progress Notes (Deleted)
FOLLOW UP  Assessment and Plan:   Hypertension Well controlled with current medications  Monitor blood pressure at home; patient to call if consistently greater than 130/80 Continue DASH diet.   Reminder to go to the ER if any CP, SOB, nausea, dizziness, severe HA, changes vision/speech, left arm numbness and tingling and jaw pain.  Cholesterol Currently near goal on zetia; diet discussed Continue low cholesterol diet and exercise.  Check lipid panel.   Other abnormal glucose Recent A1Cs at goal Discussed diet/exercise, weight management  Defer A1C; check CMP  BMI *** Continue to recommend diet heavy in fruits and veggies and low in animal meats, cheeses, and dairy products, appropriate calorie intake Discuss exercise recommendations routinely Continue to monitor weight at each visit  Vitamin D Def Below goal at last visit; she has *** changed dose continue supplementation to maintain goal of 70-100 *** Vit D level  Continue diet and meds as discussed. Further disposition pending results of labs. Discussed med's effects and SE's.   Over 30 minutes of exam, counseling, chart review, and critical decision making was performed.   Future Appointments  Date Time Provider [place]  09/17/2018  4:30 PM Liane Comber, NP [facility] None  12/24/2018  4:30 PM Unk Pinto, MD [facility] None  07/12/2019  3:00 PM Unk Pinto, MD [facility] None    ----------------------------------------------------------------------------------------------------------------------  HPI 72 y.o. female  presents for 3 month follow up on hypertension, cholesterol, glucose management, weight and vitamin D deficiency.   She has OCD and on anafranil 50 mg at night; she is also prescribed klonopin  BMI is There is no height or weight on file to calculate BMI., she {HAS HAS ZOX:09604} been working on diet and exercise. Wt Readings from Last 3 Encounters:  06/09/18 152 lb 12.8 oz (69.3  kg)  05/06/18 152 lb (68.9 kg)  04/28/18 151 lb 3.2 oz (68.6 kg)    Her blood pressure {HAS HAS NOT:18834} been controlled at home, today their BP is    She {DOES_DOES VWU:98119} workout. She denies chest pain, shortness of breath, dizziness.   She is on cholesterol medication Zetia and denies myalgias. Her cholesterol is not at goal. The cholesterol last visit was:   Lab Results  Component Value Date   CHOL 191 06/09/2018   HDL 68 06/09/2018   LDLCALC 103 (H) 06/09/2018   TRIG 108 06/09/2018   CHOLHDL 2.8 06/09/2018    She {Has/has not:18111} been working on diet and exercise for glucose management, and denies {Symptoms; diabetes w/o none:19199}. Last A1C in the office was:  Lab Results  Component Value Date   HGBA1C 5.4 06/09/2018   Patient is on Vitamin D supplement but below goal, was advised to increase dose to 5000 IU at last visit *** Lab Results  Component Value Date   VD25OH 46 06/09/2018        Current Medications:  Current Outpatient Medications on File Prior to Visit  Medication Sig  . albuterol (PROAIR HFA) 108 (90 Base) MCG/ACT inhaler Inhale 2 puffs into the lungs every 6 (six) hours as needed for wheezing or shortness of breath.  Marland Kitchen atenolol (TENORMIN) 25 MG tablet TAKE 1/2 TO 1 TABLET BY  MOUTH DAILY FOR BLOOD  PRESSURE  . benzonatate (TESSALON) 200 MG capsule Take 1 perle 3 x / day to prevent cough  . Calcium 1500 MG tablet Take 1,500 mg by mouth.  . cholecalciferol (VITAMIN D) 1000 UNITS tablet Take 1,000 Units by mouth daily.  . clomiPRAMINE (ANAFRANIL)  50 MG capsule Take 1 capsule (50 mg total) by mouth at bedtime.  . clonazePAM (KLONOPIN) 2 MG tablet 2 mg at bedtime.   Marland Kitchen ezetimibe (ZETIA) 10 MG tablet TAKE 1 TABLET BY MOUTH  EVERY DAY FOR CHOLESTEROL  . fish oil-omega-3 fatty acids 1000 MG capsule Take 1 g by mouth daily.  . magnesium 30 MG tablet Take 30 mg by mouth 2 (two) times daily.  . Multiple Vitamin (MULTIVITAMIN) tablet Take 1 tablet by mouth  daily.  . Multiple Vitamins-Minerals (EYE VITAMINS PO) Take 1 capsule by mouth daily. ocusight  . promethazine-phenylephrine (PROMETHAZINE-PHENYLEPHRINE) 6.25-5 MG/5ML SYRP Take 1 to 2 teaspoonfuls every 4 hours for cough & congestion  . triamcinolone cream (KENALOG) 0.1 % Apply topically 4 (four) times daily.  . vitamin C (ASCORBIC ACID) 500 MG tablet Take 500 mg by mouth daily.   No current facility-administered medications on file prior to visit.      Allergies:  Allergies  Allergen Reactions  . Asa [Aspirin] Diarrhea  . Levaquin [Levofloxacin] Other (See Comments)    Joint Pain  . Macrolides And Ketolides Nausea And Vomiting  . Paxil [Paroxetine Hcl] Nausea And Vomiting  . Penicillins Hives  . Prednisone     Altered mental state  . Tetracyclines & Related Nausea And Vomiting  . Zoloft [Sertraline Hcl] Nausea And Vomiting  . Sulfa Antibiotics Rash  . Trimethoprim Rash     Medical History:  Past Medical History:  Diagnosis Date  . Anemia    mild  . Anxiety   . Colon polyp    hyperplastic  . Diverticulosis   . Esophageal stricture   . GERD (gastroesophageal reflux disease)    no s/s now- off medicines  . Hyperlipidemia   . Hypertension    denies now   . Palpitations   . Prediabetes   . Tachycardia    Family history- Reviewed and unchanged Social history- Reviewed and unchanged   Review of Systems:  Review of Systems  Constitutional: Negative for malaise/fatigue and weight loss.  HENT: Negative for hearing loss and tinnitus.   Eyes: Negative for blurred vision and double vision.  Respiratory: Negative for cough, shortness of breath and wheezing.   Cardiovascular: Negative for chest pain, palpitations, orthopnea, claudication and leg swelling.  Gastrointestinal: Negative for abdominal pain, blood in stool, constipation, diarrhea, heartburn, melena, nausea and vomiting.  Genitourinary: Negative.   Musculoskeletal: Negative for joint pain and myalgias.  Skin:  Negative for rash.  Neurological: Negative for dizziness, tingling, sensory change, weakness and headaches.  Endo/Heme/Allergies: Negative for polydipsia.  Psychiatric/Behavioral: Negative.   All other systems reviewed and are negative.     Physical Exam: LMP 12/09/1994  Wt Readings from Last 3 Encounters:  06/09/18 152 lb 12.8 oz (69.3 kg)  05/06/18 152 lb (68.9 kg)  04/28/18 151 lb 3.2 oz (68.6 kg)   General Appearance: Well nourished, in no apparent distress. Eyes: PERRLA, EOMs, conjunctiva no swelling or erythema Sinuses: No Frontal/maxillary tenderness ENT/Mouth: Ext aud canals clear, TMs without erythema, bulging. No erythema, swelling, or exudate on post pharynx.  Tonsils not swollen or erythematous. Hearing normal.  Neck: Supple, thyroid normal.  Respiratory: Respiratory effort normal, BS equal bilaterally without rales, rhonchi, wheezing or stridor.  Cardio: RRR with no MRGs. Brisk peripheral pulses without edema.  Abdomen: Soft, + BS.  Non tender, no guarding, rebound, hernias, masses. Lymphatics: Non tender without lymphadenopathy.  Musculoskeletal: Full ROM, 5/5 strength, {PSY - GAIT AND STATION:22860} gait Skin: Warm, dry without  rashes, lesions, ecchymosis.  Neuro: Cranial nerves intact. No cerebellar symptoms.  Psych: Awake and oriented X 3, normal affect, Insight and Judgment appropriate.    Izora Ribas, NP 5:45 PM [facility]

## 2018-09-17 ENCOUNTER — Ambulatory Visit: Payer: Self-pay | Admitting: Adult Health

## 2018-10-02 ENCOUNTER — Other Ambulatory Visit: Payer: Self-pay | Admitting: Adult Health

## 2018-10-28 ENCOUNTER — Encounter: Payer: Self-pay | Admitting: Emergency Medicine

## 2018-10-28 DIAGNOSIS — F40243 Fear of flying: Secondary | ICD-10-CM | POA: Insufficient documentation

## 2018-11-02 NOTE — Progress Notes (Signed)
FOLLOW UP  Assessment and Plan:   Tachycardia Well controlled with current medications  Continue DASH diet.   Reminder to go to the ER if any CP, SOB, nausea, dizziness, severe HA, changes vision/speech, left arm numbness and tingling and jaw pain.  Cholesterol Currently at goal; continue zetia, omega 3 Continue low cholesterol diet and exercise.  Check lipid panel.   Other abnormal glucose Recent A1Cs at goal Discussed diet/exercise, weight management  Defer A1C; check CMP  BMI 24 Continue to recommend diet heavy in fruits and veggies and low in animal meats, cheeses, and dairy products, appropriate calorie intake Discuss exercise recommendations routinely Continue to monitor weight at each visit  Vitamin D Def Below goal at last visit;  continue supplementation to maintain goal of 70-100 Check Vit D level  Continue diet and meds as discussed. Further disposition pending results of labs. Discussed med's effects and SE's.   Over 30 minutes of exam, counseling, chart review, and critical decision making was performed.   Future Appointments  Date Time Provider [place]  11/12/2018  2:45 PM Cottle, Billey Co., MD [facility] None  12/24/2018  4:30 PM Unk Pinto, MD [facility] None  07/12/2019  3:00 PM Unk Pinto, MD [facility] None    ----------------------------------------------------------------------------------------------------------------------  HPI 72 y.o. female  presents for 3 month follow up on hypertension, cholesterol, glucose management, weight and vitamin D deficiency.   she has a diagnosis of anxiety and is currently on klonopin, takes 1/2 tab daily in the evening, hasn't needed it during the day and plans to start tapering down, reports symptoms are well controlled on current regimen.   BMI is Body mass index is 23.85 kg/m., she has been working on diet and exercise, she jogs 7 days a week for 30 min.  Wt Readings from Last 3 Encounters:   11/03/18 150 lb (68 kg)  06/09/18 152 lb 12.8 oz (69.3 kg)  05/06/18 152 lb (68.9 kg)   Her blood pressure has been controlled at home, today their BP is BP: 96/62  She does workout. She denies chest pain, shortness of breath, dizziness.   She is on cholesterol medication Zetia and omega 3 and denies myalgias. Her cholesterol is at goal. The cholesterol last visit was:   Lab Results  Component Value Date   CHOL 191 06/09/2018   HDL 68 06/09/2018   LDLCALC 103 (H) 06/09/2018   TRIG 108 06/09/2018   CHOLHDL 2.8 06/09/2018    She has been working on diet and exercise for glucose management with hx of prediabetes, and denies increased appetite, nausea, paresthesia of the feet, polydipsia, polyuria and visual disturbances. Last A1C in the office was:  Lab Results  Component Value Date   HGBA1C 5.4 06/09/2018   Patient is on Vitamin D supplement, taking 2000 IU daily    Lab Results  Component Value Date   VD25OH 46 06/09/2018       Current Medications:  Current Outpatient Medications on File Prior to Visit  Medication Sig  . atenolol (TENORMIN) 25 MG tablet TAKE 1/2 TO 1 TABLET BY  MOUTH DAILY FOR BLOOD  PRESSURE  . Calcium 1500 MG tablet Take 1,500 mg by mouth.  . cholecalciferol (VITAMIN D) 1000 UNITS tablet Take 1,000 Units by mouth daily.  . clomiPRAMINE (ANAFRANIL) 50 MG capsule Take 1 capsule (50 mg total) by mouth at bedtime.  . clonazePAM (KLONOPIN) 2 MG tablet 2 mg at bedtime.   Marland Kitchen ezetimibe (ZETIA) 10 MG tablet TAKE 1 TABLET  BY MOUTH  EVERY DAY FOR CHOLESTEROL  . fish oil-omega-3 fatty acids 1000 MG capsule Take 1 g by mouth daily.  . magnesium 30 MG tablet Take 30 mg by mouth 2 (two) times daily.  . Multiple Vitamin (MULTIVITAMIN) tablet Take 1 tablet by mouth daily.  . Multiple Vitamins-Minerals (EYE VITAMINS PO) Take 1 capsule by mouth daily. ocusight  . triamcinolone cream (KENALOG) 0.1 % Apply topically 4 (four) times daily.  . vitamin C (ASCORBIC ACID) 500 MG  tablet Take 500 mg by mouth daily.   No current facility-administered medications on file prior to visit.      Allergies:  Allergies  Allergen Reactions  . Asa [Aspirin] Diarrhea  . Levaquin [Levofloxacin] Other (See Comments)    Joint Pain  . Macrolides And Ketolides Nausea And Vomiting  . Paxil [Paroxetine Hcl] Nausea And Vomiting  . Penicillins Hives  . Prednisone     Altered mental state  . Tetracyclines & Related Nausea And Vomiting  . Zoloft [Sertraline Hcl] Nausea And Vomiting  . Sulfa Antibiotics Rash  . Trimethoprim Rash     Medical History:  Past Medical History:  Diagnosis Date  . Anemia    mild  . Anxiety   . Colon polyp    hyperplastic  . Diverticulosis   . Esophageal stricture   . GERD (gastroesophageal reflux disease)    no s/s now- off medicines  . Hyperlipidemia   . Hypertension    denies now   . Palpitations   . Prediabetes   . Tachycardia    Family history- Reviewed and unchanged Social history- Reviewed and unchanged   Review of Systems:  Review of Systems  Constitutional: Negative for malaise/fatigue and weight loss.  HENT: Negative for hearing loss and tinnitus.   Eyes: Negative for blurred vision and double vision.  Respiratory: Negative for cough, shortness of breath and wheezing.   Cardiovascular: Negative for chest pain, palpitations, orthopnea, claudication and leg swelling.  Gastrointestinal: Negative for abdominal pain, blood in stool, constipation, diarrhea, heartburn, melena, nausea and vomiting.  Genitourinary: Negative.   Musculoskeletal: Negative for joint pain and myalgias.  Skin: Negative for rash.  Neurological: Negative for dizziness, tingling, sensory change, weakness and headaches.  Endo/Heme/Allergies: Negative for polydipsia.  Psychiatric/Behavioral: Negative.   All other systems reviewed and are negative.     Physical Exam: BP 96/62   Pulse 71   Temp (!) 97.3 F (36.3 C)   Wt 150 lb (68 kg)   LMP  12/09/1994   SpO2 96%   BMI 23.85 kg/m  Wt Readings from Last 3 Encounters:  11/03/18 150 lb (68 kg)  06/09/18 152 lb 12.8 oz (69.3 kg)  05/06/18 152 lb (68.9 kg)   General Appearance: Well nourished, in no apparent distress. Eyes: PERRLA, EOMs, conjunctiva no swelling or erythema Sinuses: No Frontal/maxillary tenderness ENT/Mouth: Ext aud canals clear, TMs without erythema, bulging. No erythema, swelling, or exudate on post pharynx.  Tonsils not swollen or erythematous. Hearing normal.  Neck: Supple, thyroid normal.  Respiratory: Respiratory effort normal, BS equal bilaterally without rales, rhonchi, wheezing or stridor.  Cardio: RRR with no MRGs. Brisk peripheral pulses without edema.  Abdomen: Soft, + BS.  Non tender, no guarding, rebound, hernias, masses. Lymphatics: Non tender without lymphadenopathy.  Musculoskeletal: Full ROM, 5/5 strength, Normal gait Skin: Warm, dry without rashes, lesions, ecchymosis.  Neuro: Cranial nerves intact. No cerebellar symptoms.  Psych: Awake and oriented X 3, normal affect, Insight and Judgment appropriate.    Caryl Pina  Talmadge Coventry, NP 4:20 PM [facility]

## 2018-11-03 ENCOUNTER — Ambulatory Visit: Payer: Medicare Other | Admitting: Adult Health

## 2018-11-03 ENCOUNTER — Encounter: Payer: Self-pay | Admitting: Adult Health

## 2018-11-03 VITALS — BP 96/62 | HR 71 | Temp 97.3°F | Wt 150.0 lb

## 2018-11-03 DIAGNOSIS — Z23 Encounter for immunization: Secondary | ICD-10-CM | POA: Diagnosis not present

## 2018-11-03 DIAGNOSIS — E782 Mixed hyperlipidemia: Secondary | ICD-10-CM

## 2018-11-03 DIAGNOSIS — Z6824 Body mass index (BMI) 24.0-24.9, adult: Secondary | ICD-10-CM

## 2018-11-03 DIAGNOSIS — R000 Tachycardia, unspecified: Secondary | ICD-10-CM | POA: Diagnosis not present

## 2018-11-03 DIAGNOSIS — R7309 Other abnormal glucose: Secondary | ICD-10-CM

## 2018-11-03 DIAGNOSIS — Z79899 Other long term (current) drug therapy: Secondary | ICD-10-CM

## 2018-11-03 DIAGNOSIS — E559 Vitamin D deficiency, unspecified: Secondary | ICD-10-CM | POA: Diagnosis not present

## 2018-11-03 NOTE — Addendum Note (Signed)
Addended by: Chancy Hurter on: 11/03/2018 04:53 PM   Modules accepted: Orders

## 2018-11-03 NOTE — Patient Instructions (Addendum)
Know what a healthy weight is for you (roughly BMI <25) and aim to maintain this  Aim for 7+ servings of fruits and vegetables daily  65-80+ fluid ounces of water or unsweet tea for healthy kidneys  Limit to max 1 drink of alcohol per day; avoid smoking/tobacco  Limit animal fats in diet for cholesterol and heart health - choose grass fed whenever available  Avoid highly processed foods, and foods high in saturated/trans fats  Aim for low stress - take time to unwind and care for your mental health  Aim for 150 min of moderate intensity exercise weekly for heart health, and weights twice weekly for bone health  Aim for 7-9 hours of sleep daily       When it comes to diets, agreement about the perfect plan isn't easy to find, even among the experts. Experts at the [employer] developed an idea known as the Healthy Eating Plate. Just imagine a plate divided into logical, healthy portions.  The emphasis is on diet quality:  Load up on vegetables and fruits - one-half of your plate: Aim for color and variety, and remember that potatoes don't count.  Go for whole grains - one-quarter of your plate: Whole wheat, barley, wheat berries, quinoa, oats, brown rice, and foods made with them. If you want pasta, go with whole wheat pasta.  Protein power - one-quarter of your plate: Fish, chicken, beans, and nuts are all healthy, versatile protein sources. Limit red meat.  The diet, however, does go beyond the plate, offering a few other suggestions.  Use healthy plant oils, such as olive, canola, soy, corn, sunflower and peanut. Check the labels, and avoid partially hydrogenated oil, which have unhealthy trans fats.  If you're thirsty, drink water. Coffee and tea are good in moderation, but skip sugary drinks and limit milk and dairy products to one or two daily servings.  The type of carbohydrate in the diet is more important than the amount. Some sources of  carbohydrates, such as vegetables, fruits, whole grains, and beans-are healthier than others.  Finally, stay active.

## 2018-11-04 LAB — COMPLETE METABOLIC PANEL WITH GFR
AG Ratio: 2.1 (calc) (ref 1.0–2.5)
ALT: 18 U/L (ref 6–29)
AST: 11 U/L (ref 10–35)
Albumin: 4.6 g/dL (ref 3.6–5.1)
Alkaline phosphatase (APISO): 67 U/L (ref 33–130)
BUN: 13 mg/dL (ref 7–25)
CO2: 29 mmol/L (ref 20–32)
Calcium: 9.9 mg/dL (ref 8.6–10.4)
Chloride: 102 mmol/L (ref 98–110)
Creat: 0.72 mg/dL (ref 0.60–0.93)
GFR, Est African American: 98 mL/min/{1.73_m2} (ref >=60)
GFR, Est Non African American: 84 mL/min/{1.73_m2} (ref >=60)
Globulin: 2.2 g/dL (calc) (ref 1.9–3.7)
Glucose, Bld: 90 mg/dL (ref 65–99)
Potassium: 4.3 mmol/L (ref 3.5–5.3)
Sodium: 138 mmol/L (ref 135–146)
Total Bilirubin: 0.5 mg/dL (ref 0.2–1.2)
Total Protein: 6.8 g/dL (ref 6.1–8.1)

## 2018-11-04 LAB — CBC WITH DIFFERENTIAL/PLATELET
Basophils Absolute: 50 cells/uL (ref 0–200)
Basophils Relative: 0.8 %
Eosinophils Absolute: 321 cells/uL (ref 15–500)
Eosinophils Relative: 5.1 %
HCT: 41.3 % (ref 35.0–45.0)
Hemoglobin: 13.7 g/dL (ref 11.7–15.5)
Lymphs Abs: 2369 cells/uL (ref 850–3900)
MCH: 29.5 pg (ref 27.0–33.0)
MCHC: 33.2 g/dL (ref 32.0–36.0)
MCV: 88.8 fL (ref 80.0–100.0)
MPV: 9.7 fL (ref 7.5–12.5)
Monocytes Relative: 9 %
Neutro Abs: 2993 cells/uL (ref 1500–7800)
Neutrophils Relative %: 47.5 %
Platelets: 255 10*3/uL (ref 140–400)
RBC: 4.65 10*6/uL (ref 3.80–5.10)
RDW: 12.6 % (ref 11.0–15.0)
Total Lymphocyte: 37.6 %
WBC mixed population: 567 cells/uL (ref 200–950)
WBC: 6.3 10*3/uL (ref 3.8–10.8)

## 2018-11-04 LAB — LIPID PANEL
Cholesterol: 200 mg/dL — ABNORMAL HIGH (ref <200)
HDL: 63 mg/dL (ref >50)
LDL Cholesterol (Calc): 110 mg/dL (calc) — ABNORMAL HIGH
Non-HDL Cholesterol (Calc): 137 mg/dL (calc) — ABNORMAL HIGH (ref <130)
Total CHOL/HDL Ratio: 3.2 (calc) (ref <5.0)
Triglycerides: 152 mg/dL — ABNORMAL HIGH (ref <150)

## 2018-11-04 LAB — TSH: TSH: 1.66 mIU/L (ref 0.40–4.50)

## 2018-11-04 LAB — VITAMIN D 25 HYDROXY (VIT D DEFICIENCY, FRACTURES): Vit D, 25-Hydroxy: 59 ng/mL (ref 30–100)

## 2018-11-12 ENCOUNTER — Ambulatory Visit: Payer: Medicare Other | Admitting: Psychiatry

## 2018-11-12 ENCOUNTER — Encounter: Payer: Self-pay | Admitting: Psychiatry

## 2018-11-12 DIAGNOSIS — F32A Depression, unspecified: Secondary | ICD-10-CM

## 2018-11-12 DIAGNOSIS — F329 Major depressive disorder, single episode, unspecified: Secondary | ICD-10-CM

## 2018-11-12 DIAGNOSIS — F422 Mixed obsessional thoughts and acts: Secondary | ICD-10-CM

## 2018-11-12 MED ORDER — CLOMIPRAMINE HCL 50 MG PO CAPS: 50.0000 mg | ORAL_CAPSULE | Freq: Every day | ORAL | 3 refills | Status: DC

## 2018-11-12 MED ORDER — CLONAZEPAM 2 MG PO TABS: 1.0000 mg | ORAL_TABLET | Freq: Every day | ORAL | 1 refills | Status: DC

## 2018-11-12 NOTE — Progress Notes (Signed)
Donna Dillon [id number] Jul 21, 1946 72 y.o.  Subjective:   Patient ID:  Donna Dillon is a 72 y.o. (DOB June 07, 1946) female.  Chief Complaint:  Chief Complaint  Patient presents with  . Anxiety    HPI Donna Dillon presents to the office today for follow-up of OCD.    Had pneumonia for 2 mos in April.  Resolved.  Long hx OCD which was severe and failed multiple SSRI but responded best to clomipramine and has maintained benefit from the med.  OCD generally controlled except when very tired.  Can sometimes reason her way out of the OCD, obsessions which she could not do before the meds.  No SE from meds.  Cog is intact.  Still working PT for mental health reasons.    Thinks the benefit of clonazepam is worth the cognitive risks.     Review of Systems:  Review of Systems  Neurological: Negative for tremors and weakness.  Psychiatric/Behavioral: Negative for agitation, behavioral problems, confusion, decreased concentration, dysphoric mood, hallucinations, self-injury, sleep disturbance and suicidal ideas. The patient is nervous/anxious. The patient is not hyperactive.     Medications: I have reviewed the patient's current medications.  Current Outpatient Medications  Medication Sig Dispense Refill  . atenolol (TENORMIN) 25 MG tablet TAKE 1/2 TO 1 TABLET BY  MOUTH DAILY FOR BLOOD  PRESSURE 90 tablet 1  . Calcium 1500 MG tablet Take 1,500 mg by mouth.    . cholecalciferol (VITAMIN D) 1000 UNITS tablet Take 1,000 Units by mouth daily.    . clomiPRAMINE (ANAFRANIL) 50 MG capsule Take 1 capsule (50 mg total) by mouth at bedtime.    . clonazePAM (KLONOPIN) 2 MG tablet 1-2 mg at bedtime.     Marland Kitchen ezetimibe (ZETIA) 10 MG tablet TAKE 1 TABLET BY MOUTH  EVERY DAY FOR CHOLESTEROL 90 tablet 1  . fish oil-omega-3 fatty acids 1000 MG capsule Take 1 g by mouth daily.    . magnesium 30 MG tablet Take 30 mg by mouth 2 (two) times daily.    . Multiple Vitamin (MULTIVITAMIN) tablet Take 1  tablet by mouth daily.    . vitamin C (ASCORBIC ACID) 500 MG tablet Take 500 mg by mouth daily.    . Multiple Vitamins-Minerals (EYE VITAMINS PO) Take 1 capsule by mouth daily. ocusight    . triamcinolone cream (KENALOG) 0.1 % Apply topically 4 (four) times daily. 85.2 g 4   No current facility-administered medications for this visit.     Medication Side Effects: None  Allergies:  Allergies  Allergen Reactions  . Asa [Aspirin] Diarrhea  . Levaquin [Levofloxacin] Other (See Comments)    Joint Pain  . Macrolides And Ketolides Nausea And Vomiting  . Paxil [Paroxetine Hcl] Nausea And Vomiting  . Penicillins Hives  . Prednisone     Altered mental state  . Tetracyclines & Related Nausea And Vomiting  . Zoloft [Sertraline Hcl] Nausea And Vomiting  . Sulfa Antibiotics Rash  . Trimethoprim Rash    Past Medical History:  Diagnosis Date  . Anemia    mild  . Anxiety   . Colon polyp    hyperplastic  . Diverticulosis   . Esophageal stricture   . GERD (gastroesophageal reflux disease)    no s/s now- off medicines  . Hyperlipidemia   . Palpitations   . Prediabetes   . Tachycardia     Family History  Problem Relation Age of Onset  . Kidney cancer Father   . Hypertension Mother   .  Heart disease Mother   . Colon cancer Paternal Aunt   . Colon polyps Neg Hx   . Esophageal cancer Neg Hx   . Rectal cancer Neg Hx   . Stomach cancer Neg Hx     Social History   Socioeconomic History  . Marital status: Married    Spouse name: Not on file  . Number of children: 1  . Years of education: Not on file  . Highest education level: Not on file  Occupational History  . Occupation: Associate Professor  . Financial resource strain: Not on file  . Food insecurity:    Worry: Not on file    Inability: Not on file  . Transportation needs:    Medical: Not on file    Non-medical: Not on file  Tobacco Use  . Smoking status: Never Smoker  . Smokeless tobacco: Never Used   Substance and Sexual Activity  . Alcohol use: No    Alcohol/week: 0.0 standard drinks  . Drug use: No  . Sexual activity: Not on file  Lifestyle  . Physical activity:    Days per week: Not on file    Minutes per session: Not on file  . Stress: Not on file  Relationships  . Social connections:    Talks on phone: Not on file    Gets together: Not on file    Attends religious service: Not on file    Active member of club or organization: Not on file    Attends meetings of clubs or organizations: Not on file    Relationship status: Not on file  . Intimate partner violence:    Fear of current or ex partner: Not on file    Emotionally abused: Not on file    Physically abused: Not on file    Forced sexual activity: Not on file  Other Topics Concern  . Not on file  Social History Narrative  . Not on file    Past Medical History, Surgical history, Social history, and Family history were reviewed and updated as appropriate.   Son has severe anxiety and is treated also.  Please see review of systems for further details on the patient's review from today.   Objective:   Physical Exam:  LMP 12/09/1994   Physical Exam  Constitutional: She is oriented to person, place, and time. She appears well-developed. No distress.  Musculoskeletal: She exhibits no deformity.  Neurological: She is alert and oriented to person, place, and time. She displays no tremor. Coordination and gait normal.  Psychiatric: She has a normal mood and affect. Her speech is normal and behavior is normal. Judgment and thought content normal. Her mood appears not anxious. Her affect is not angry, not blunt, not labile and not inappropriate. Cognition and memory are normal. She does not exhibit a depressed mood. She expresses no homicidal and no suicidal ideation. She expresses no suicidal plans and no homicidal plans.  Insight intact. Residual obsesssions manageable. No auditory or visual hallucinations. No  delusions.  She is attentive.    Lab Review:     Component Value Date/Time   NA 138 11/03/2018 1638   K 4.3 11/03/2018 1638   CL 102 11/03/2018 1638   CO2 29 11/03/2018 1638   GLUCOSE 90 11/03/2018 1638   BUN 13 11/03/2018 1638   CREATININE 0.72 11/03/2018 1638   CALCIUM 9.9 11/03/2018 1638   PROT 6.8 11/03/2018 1638   ALBUMIN 4.0 05/13/2017 1510   AST 11 11/03/2018 1638  ALT 18 11/03/2018 1638   ALKPHOS 61 05/13/2017 1510   BILITOT 0.5 11/03/2018 1638   GFRNONAA 84 11/03/2018 1638   GFRAA 98 11/03/2018 1638       Component Value Date/Time   WBC 6.3 11/03/2018 1638   RBC 4.65 11/03/2018 1638   HGB 13.7 11/03/2018 1638   HCT 41.3 11/03/2018 1638   PLT 255 11/03/2018 1638   MCV 88.8 11/03/2018 1638   MCH 29.5 11/03/2018 1638   MCHC 33.2 11/03/2018 1638   RDW 12.6 11/03/2018 1638   LYMPHSABS 2,369 11/03/2018 1638   MONOABS 552 05/13/2017 1510   EOSABS 321 11/03/2018 1638   BASOSABS 50 11/03/2018 1638    No results found for: POCLITH, LITHIUM   No results found for: PHENYTOIN, PHENOBARB, VALPROATE, CBMZ   .res Assessment: Plan:    Mixed obsessional thoughts and acts   We discussed the short-term risks associated with benzodiazepines including sedation and increased fall risk among others.  Discussed long-term side effect risk including dependence, potential withdrawal symptoms, and the potential eventual dose-related risk of dementia. She accepts the risk bc feels she must have the clonazepam and clomipramine.  Disc cardiac risks of the clomipramine.  Overall OCD is managed fairly well with low dose clomipramine.  Consider repeat blood level if she ever gets SE.  PT work also helps bc it's a distraction.  Disc CBT techniques to fight the obsessions that remain and occasionally recur.  This appt was 30 mins.  FU 6 mos  Lynder Parents, MD, DFAPA   Please see After Visit Summary for patient specific instructions.  Future Appointments  Date Time Provider  [place]  12/24/2018  4:30 PM Unk Pinto, MD [facility] None  07/12/2019  3:00 PM Unk Pinto, MD [facility] None    No orders of the defined types were placed in this encounter.     -------------------------------

## 2018-12-05 ENCOUNTER — Other Ambulatory Visit: Payer: Self-pay | Admitting: Internal Medicine

## 2018-12-18 ENCOUNTER — Other Ambulatory Visit: Payer: Self-pay

## 2018-12-18 ENCOUNTER — Emergency Department (HOSPITAL_COMMUNITY)
Admission: EM | Admit: 2018-12-18 | Discharge: 2018-12-18 | Disposition: A | Discharge status: Home / Self Care | Payer: Medicare Other | Attending: Emergency Medicine | Admitting: Emergency Medicine

## 2018-12-18 ENCOUNTER — Encounter (HOSPITAL_COMMUNITY): Payer: Self-pay | Admitting: Emergency Medicine

## 2018-12-18 ENCOUNTER — Telehealth: Payer: Self-pay | Admitting: Internal Medicine

## 2018-12-18 ENCOUNTER — Emergency Department (HOSPITAL_COMMUNITY): Payer: Medicare Other

## 2018-12-18 DIAGNOSIS — T18108A Unspecified foreign body in esophagus causing other injury, initial encounter: Secondary | ICD-10-CM | POA: Insufficient documentation

## 2018-12-18 DIAGNOSIS — Z79899 Other long term (current) drug therapy: Secondary | ICD-10-CM | POA: Diagnosis not present

## 2018-12-18 DIAGNOSIS — Y929 Unspecified place or not applicable: Secondary | ICD-10-CM | POA: Insufficient documentation

## 2018-12-18 DIAGNOSIS — R0989 Other specified symptoms and signs involving the circulatory and respiratory systems: Secondary | ICD-10-CM | POA: Diagnosis present

## 2018-12-18 DIAGNOSIS — Y939 Activity, unspecified: Secondary | ICD-10-CM | POA: Insufficient documentation

## 2018-12-18 DIAGNOSIS — X58XXXA Exposure to other specified factors, initial encounter: Secondary | ICD-10-CM | POA: Insufficient documentation

## 2018-12-18 DIAGNOSIS — Y999 Unspecified external cause status: Secondary | ICD-10-CM | POA: Diagnosis not present

## 2018-12-18 LAB — I-STAT TROPONIN, ED: TROPONIN I, POC: 0 ng/mL (ref 0.00–0.08)

## 2018-12-18 LAB — CBC WITH DIFFERENTIAL/PLATELET
Abs Immature Granulocytes: 0.02 10*3/uL (ref 0.00–0.07)
BASOS ABS: 0.1 10*3/uL (ref 0.0–0.1)
Basophils Relative: 1 %
EOS PCT: 4 %
Eosinophils Absolute: 0.3 10*3/uL (ref 0.0–0.5)
HCT: 41 % (ref 36.0–46.0)
Hemoglobin: 13 g/dL (ref 12.0–15.0)
IMMATURE GRANULOCYTES: 0 %
Lymphocytes Relative: 32 %
Lymphs Abs: 3 10*3/uL (ref 0.7–4.0)
MCH: 29.3 pg (ref 26.0–34.0)
MCHC: 31.7 g/dL (ref 30.0–36.0)
MCV: 92.3 fL (ref 80.0–100.0)
Monocytes Absolute: 0.8 10*3/uL (ref 0.1–1.0)
Monocytes Relative: 9 %
NEUTROS PCT: 54 %
NRBC: 0 % (ref 0.0–0.2)
Neutro Abs: 5 10*3/uL (ref 1.7–7.7)
Platelets: 236 10*3/uL (ref 150–400)
RBC: 4.44 MIL/uL (ref 3.87–5.11)
RDW: 12.8 % (ref 11.5–15.5)
WBC: 9.2 10*3/uL (ref 4.0–10.5)

## 2018-12-18 LAB — BASIC METABOLIC PANEL
ANION GAP: 9 (ref 5–15)
BUN: 11 mg/dL (ref 8–23)
CALCIUM: 9.4 mg/dL (ref 8.9–10.3)
CHLORIDE: 104 mmol/L (ref 98–111)
CO2: 26 mmol/L (ref 22–32)
Creatinine, Ser: 0.79 mg/dL (ref 0.44–1.00)
Glucose, Bld: 106 mg/dL — ABNORMAL HIGH (ref 70–99)
Potassium: 3.5 mmol/L (ref 3.5–5.1)
SODIUM: 139 mmol/L (ref 135–145)

## 2018-12-18 SURGERY — EGD (ESOPHAGOGASTRODUODENOSCOPY)
Anesthesia: Moderate Sedation

## 2018-12-18 MED ORDER — GLUCAGON HCL RDNA (DIAGNOSTIC) 1 MG IJ SOLR
1.0000 mg | Freq: Once | INTRAMUSCULAR | Status: AC
  Administered 2018-12-18: 1 mg via INTRAVENOUS
  Filled 2018-12-18: qty 1

## 2018-12-18 MED ORDER — SODIUM CHLORIDE 0.9 % IV BOLUS
1000.0000 mL | Freq: Once | INTRAVENOUS | Status: AC
  Administered 2018-12-18: 1000 mL via INTRAVENOUS

## 2018-12-18 MED ORDER — FENTANYL CITRATE (PF) 100 MCG/2ML IJ SOLN
50.0000 ug | Freq: Once | INTRAMUSCULAR | Status: AC
  Administered 2018-12-18: 50 ug via INTRAVENOUS
  Filled 2018-12-18: qty 2

## 2018-12-18 MED ORDER — ONDANSETRON HCL 4 MG/2ML IJ SOLN: 2.0000 mg | Freq: Once | INTRAMUSCULAR | Status: DC

## 2018-12-18 MED ORDER — SODIUM CHLORIDE 0.9 % IV BOLUS
500.0000 mL | Freq: Once | INTRAVENOUS | Status: AC
  Administered 2018-12-18: 500 mL via INTRAVENOUS

## 2018-12-18 MED ORDER — FENTANYL CITRATE (PF) 100 MCG/2ML IJ SOLN: 50.0000 ug | Freq: Once | INTRAMUSCULAR | Status: DC

## 2018-12-18 MED ORDER — OMEPRAZOLE 20 MG PO CPDR: 20.0000 mg | DELAYED_RELEASE_CAPSULE | Freq: Every day | ORAL | 0 refills | Status: DC

## 2018-12-18 NOTE — ED Provider Notes (Signed)
[facility] DEPT Provider Note   CSN: [id number] Arrival date & time: 12/18/18  1604     History   Chief Complaint No chief complaint on file.   HPI Donna Dillon is a 73 y.o. female presenting for foreign body sensation in the throat that began approximately 30 minutes prior to arrival.  Patient states that she was eating an apple when her pain began.  States a sudden pressure like feeling in the center of her chest is constant and worsened with swallowing.  Patient states that she has had similar pain in the before where she had had have gastroenterology remove food endoscopically.  Patient states that she has had dilation of her esophagus in the past however has not followed up with her GI specialist for dilation in the past few years.  Patient states that over the past few weeks she has felt food become stuck in this same area and has progressively worsened however she has not had something this severe in quite some time.  Patient states that she has trouble swallowing her saliva and is needing to spit it out every few minutes because she feels a buildup in the back of her throat.  Patient denies shortness of breath, fever, recent illness or any other concerns today.  HPI  Past Medical History:  Diagnosis Date  . Anemia    mild  . Anxiety   . Colon polyp    hyperplastic  . Diverticulosis   . Esophageal stricture   . GERD (gastroesophageal reflux disease)    no s/s now- off medicines  . Hyperlipidemia   . Palpitations   . Prediabetes   . Tachycardia     Patient Active Problem List   Diagnosis Date Noted  . Flying phobia 10/28/2018  . OCD (obsessive compulsive disorder) 08/22/2018  . FHx: heart disease 06/09/2018  . BMI 24.0-24.9, adult 09/21/2015  . Vitamin D deficiency 03/08/2014  . Hyperlipidemia   . Tachycardia   . Other abnormal glucose   . Osteopenia 02/25/2013  . ESOPHAGEAL STRICTURE 06/27/2010  . GERD 06/28/2009  .  PERSONAL HX COLONIC POLYPS 06/28/2009    Past Surgical History:  Procedure Laterality Date  . BREAST BIOPSY Left   . CESAREAN SECTION     x1  . COLONOSCOPY    . eye lift    . POLYPECTOMY    . TONSILLECTOMY    . UPPER GASTROINTESTINAL ENDOSCOPY       OB History    Gravida  1   Para  1   Term  1   Preterm      AB      Living  1     SAB      TAB      Ectopic      Multiple      Live Births  1            Home Medications    Prior to Admission medications   Medication Sig Start Date End Date Taking? Authorizing Provider  atenolol (TENORMIN) 25 MG tablet Take 1 tablet daily for BP Patient taking differently: Take 12.5 mg by mouth at bedtime.  12/06/18  Yes Unk Pinto, MD  Calcium 1500 MG tablet Take 1,500 mg by mouth daily.    Yes [provider]  cholecalciferol (VITAMIN D) 1000 UNITS tablet Take 1,000 Units by mouth daily.   Yes [provider]  clomiPRAMINE (ANAFRANIL) 50 MG capsule Take 1 capsule (50 mg total) by  mouth at bedtime. 11/12/18  Yes Cottle, Billey Co., MD  clonazePAM (KLONOPIN) 2 MG tablet Take 0.5-1 tablets (1-2 mg total) by mouth at bedtime. 11/12/18  Yes Cottle, Billey Co., MD  ezetimibe (ZETIA) 10 MG tablet TAKE 1 TABLET BY MOUTH  EVERY DAY FOR CHOLESTEROL Patient taking differently: Take 10 mg by mouth at bedtime.  10/03/18  Yes Unk Pinto, MD  fish oil-omega-3 fatty acids 1000 MG capsule Take 1 g by mouth daily.   Yes [provider]  magnesium 30 MG tablet Take 30 mg by mouth daily.    Yes [provider]  Multiple Vitamin (MULTIVITAMIN) tablet Take 1 tablet by mouth daily.   Yes [provider]  Multiple Vitamins-Minerals (EYE VITAMINS PO) Take 1 capsule by mouth daily. Ocusight Vitamins   Yes [provider]  vitamin C (ASCORBIC ACID) 500 MG tablet Take 500 mg by mouth daily.   Yes [provider]  omeprazole (PRILOSEC) 20 MG capsule Take 1 capsule (20 mg total)  by mouth daily. 12/18/18   Nuala Alpha A, PA-C  triamcinolone cream (KENALOG) 0.1 % Apply topically 4 (four) times daily. Patient not taking: Reported on 12/18/2018 11/19/17   Unk Pinto, MD    Family History Family History  Problem Relation Age of Onset  . Kidney cancer Father   . Hypertension Mother   . Heart disease Mother   . Colon cancer Paternal Aunt   . Colon polyps Neg Hx   . Esophageal cancer Neg Hx   . Rectal cancer Neg Hx   . Stomach cancer Neg Hx     Social History Social History   Tobacco Use  . Smoking status: Never Smoker  . Smokeless tobacco: Never Used  Substance Use Topics  . Alcohol use: No    Alcohol/week: 0.0 standard drinks  . Drug use: No     Allergies   Asa [aspirin]; Levaquin [levofloxacin]; Macrolides and ketolides; Paxil [paroxetine hcl]; Penicillins; Prednisone; Tetracyclines & related; Zoloft [sertraline hcl]; Sulfa antibiotics; and Trimethoprim   Review of Systems Review of Systems  Constitutional: Negative.  Negative for chills and fever.  HENT: Positive for drooling and trouble swallowing. Negative for sore throat.   Respiratory: Negative.  Negative for cough and shortness of breath.   Cardiovascular: Negative.  Negative for chest pain.  Gastrointestinal: Negative.  Negative for abdominal pain, diarrhea, nausea and vomiting.  Musculoskeletal: Negative.  Negative for arthralgias and myalgias.  Neurological: Negative.  Negative for dizziness, weakness and headaches.  All other systems reviewed and are negative.  Physical Exam Updated Vital Signs BP 118/66 (BP Location: Left Arm)   Pulse 67   Temp 98.3 F (36.8 C) (Oral)   Resp 18   Ht 5\' 6"  (1.676 m)   Wt 66.7 kg   LMP 12/09/1994   SpO2 100%   BMI 23.73 kg/m   Physical Exam Constitutional:      General: She is not in acute distress.    Appearance: She is well-developed. She is not ill-appearing or diaphoretic.     Comments: Uncomfortable appearing  HENT:      Head: Normocephalic and atraumatic.     Jaw: There is normal jaw occlusion.     Right Ear: External ear normal.     Left Ear: External ear normal.     Nose: Nose normal.     Mouth/Throat:     Lips: Pink.     Mouth: Mucous membranes are moist.     Pharynx: Oropharynx is  clear. Uvula midline.     Comments: Patient is drooling Eyes:     General: Vision grossly intact. Gaze aligned appropriately.     Extraocular Movements: Extraocular movements intact.     Conjunctiva/sclera: Conjunctivae normal.     Pupils: Pupils are equal, round, and reactive to light.  Neck:     Musculoskeletal: Full passive range of motion without pain, normal range of motion and neck supple.     Trachea: Trachea and phonation normal. No tracheal deviation.  Cardiovascular:     Rate and Rhythm: Normal rate and regular rhythm.     Pulses:          Dorsalis pedis pulses are 2+ on the right side and 2+ on the left side.       Posterior tibial pulses are 2+ on the right side and 2+ on the left side.     Heart sounds: Normal heart sounds.  Pulmonary:     Effort: Pulmonary effort is normal. No respiratory distress.     Breath sounds: Normal breath sounds and air entry.  Chest:     Chest wall: No tenderness.  Abdominal:     Palpations: Abdomen is soft.     Tenderness: There is no abdominal tenderness. There is no guarding or rebound.  Musculoskeletal: Normal range of motion.     Right lower leg: Normal.     Left lower leg: Normal.  Feet:     Right foot:     Protective Sensation: 3 sites tested. 3 sites sensed.     Left foot:     Protective Sensation: 3 sites tested. 3 sites sensed.  Skin:    General: Skin is warm and dry.     Capillary Refill: Capillary refill takes less than 2 seconds.  Neurological:     Mental Status: She is alert and oriented to person, place, and time.     GCS: GCS eye subscore is 4. GCS verbal subscore is 5. GCS motor subscore is 6.     Comments: Speech is clear and goal oriented, follows  commands Major Cranial nerves without deficit, no facial droop Normal strength in upper and lower extremities bilaterally including dorsiflexion and plantar flexion, strong and equal grip strength Sensation normal to light touch Moves extremities without ataxia, coordination intact Normal gait  Psychiatric:        Behavior: Behavior normal.    ED Treatments / Results  Labs (all labs ordered are listed, but only abnormal results are displayed) Labs Reviewed  BASIC METABOLIC PANEL - Abnormal; Notable for the following components:      Result Value   Glucose, Bld 106 (*)    All other components within normal limits  CBC WITH DIFFERENTIAL/PLATELET  I-STAT TROPONIN, ED    EKG EKG Interpretation  Date/Time:  Friday December 18 2018 16:38:48 EST Ventricular Rate:  69 PR Interval:    QRS Duration: 81 QT Interval:  423 QTC Calculation: 454 R Axis:   42 Text Interpretation:  Sinus rhythm When compared to prior, no signifiant cahnges seen.  No STEMI Confirmed by Antony Blackbird [phone]) on 12/18/2018 7:14:14 PM   Radiology Dg Chest Portable 1 View  Result Date: 12/18/2018 CLINICAL DATA:  Suspect impaction.  Food in throat. EXAM: PORTABLE CHEST 1 VIEW COMPARISON:  05/06/2018 FINDINGS: COPD and emphysema. Heart size upper normal. Negative for heart failure. Negative for infiltrate effusion or mass. IMPRESSION: COPD without acute abnormality. Electronically Signed   By: Franchot Gallo M.D.   On: 12/18/2018 17:15  Procedures Procedures (including critical care time)  Medications Ordered in ED Medications  sodium chloride 0.9 % bolus 1,000 mL (0 mLs Intravenous Stopped 12/18/18 1917)  glucagon (human recombinant) (GLUCAGEN) injection 1 mg (1 mg Intravenous Given 12/18/18 1643)  fentaNYL (SUBLIMAZE) injection 50 mcg (50 mcg Intravenous Given 12/18/18 1713)  sodium chloride 0.9 % bolus 500 mL (0 mLs Intravenous Stopped 12/18/18 2041)     Initial Impression / Assessment and Plan / ED  Course  I have reviewed the triage vital signs and the nursing notes.  Pertinent labs & imaging results that were available during my care of the patient were reviewed by me and considered in my medical decision making (see chart for details).    40:65 PM: 73 year old female presenting today for feeling of food caught in throat that occurred approximately 30 minutes prior to arrival after eating apple.  Upon initial evaluation patient uncomfortable appearing, unable to tolerate secretions.  Patient with history of the same, esophageal impaction many years ago treated endoscopically. Lab work imaging ordered 1 mg glucagon ordered Discussed with Dr. Sherry Ruffing who is aware and agrees with plan at this time, Dr. Sherry Ruffing to see patient. ---------------- 5:05 PM: Symptoms not relieved following glucagon.  Pain medicine ordered, GI consulted.  Patient seen and evaluated by Dr. Sherry Ruffing who agrees with plan. -------------- 5:30 PM: Discussed case with [facility] gastroenterology who are coming to see patient here in emergency department. ----------- 6:03 PM: Patient seen and evaluated by gastroenterology, Dr. Carlean Purl.  Patient is now able to swallow water here in emergency department.  Dr. Carlean Purl advises that patient may be discharged and encouraged Prilosec twice daily and they will schedule a follow-up for the patient at lab our gastroenterology. -------------- CBC within normal limits BMP nonacute Troponin negative EKG without acute changes reviewed by Dr. Sherry Ruffing Chest x-ray without acute abnormality ---------------- I have reinforced gastroenterology's recommendation to the patient and her husband.  Follow-up with GI, liquid diet with progression to soft foods and Prilosec.  Of note patient did report a flushed feeling and dizziness after receiving pain medication today.  This passed quickly.  Patient was given additional fluid bolus and allowed to rest.  She denied chest pain or shortness of breath  during this episode.  Suspect vasovagal versus reaction to fentanyl.  Do not suspect acute cardiopulmonary etiology of symptoms.  Orthostatic vital signs were obtained and patient is orthostatic negative.  Discussed event with Dr. Sherry Ruffing who advises patient may be discharged.   At discharge patient is afebrile, not tachycardic, not hypotensive, well-appearing and in no acute distress.  She is able to drink emergency department without difficulty.  At this time there does not appear to be any evidence of an acute emergency medical condition and the patient appears stable for discharge with appropriate outpatient follow up. Diagnosis was discussed with patient who verbalizes understanding of care plan and is agreeable to discharge. I have discussed return precautions with patient and husband who verbalize understanding of return precautions. Patient strongly encouraged to follow-up with their PCP and GI this week. All questions answered.  Patient's case discussed with Dr. Sherry Ruffing who agrees with plan to discharge with follow-up.   Note: Portions of this report may have been transcribed using voice recognition software. Every effort was made to ensure accuracy; however, inadvertent computerized transcription errors may still be present., Final Clinical Impressions(s) / ED Diagnoses   Final diagnoses:  Impacted esophageal foreign body, initial encounter    ED Discharge Orders  Ordered    omeprazole (PRILOSEC) 20 MG capsule  Daily     12/18/18 2107           Gari Crown 12/18/18 2250    Tegeler, Gwenyth Allegra, MD 12/19/18 [phone]

## 2018-12-18 NOTE — Telephone Encounter (Signed)
Called by ED PA Erlene Quan - patioent w/ signs and sxs food impaction with apple.  Planned to do EGD - when I met her she was not regurgitating saliva anymore and felt better.  Trial of water - swallowed 4-6 oz no problem  Mild foreign body sensation.  Advised liquids tonight and then soft foods. Get back on PPI  Has hx EGD and ring dilation (Dr. Henrene Pastor)  She will need f/u him  Discussed w/ ED PA also

## 2018-12-18 NOTE — ED Triage Notes (Signed)
Reports she feels like something is stuck in her throat after eating an apple 30 minutes PTA.  States saliva will not go down.

## 2018-12-18 NOTE — Discharge Instructions (Addendum)
You have been diagnosed today with impacted esophageal foreign body.  At this time there does not appear to be the presence of an emergent medical condition, however there is always the potential for conditions to change. Please read and follow the below instructions.  Please return to the Emergency Department immediately for any new or worsening symptoms or if your symptoms return Please be sure to follow up with your Primary Care Provider next week regarding your visit today; please call their office to schedule an appointment even if you are feeling better for a follow-up visit. Please call [facility] on Monday to schedule a follow-up appointment for future evaluation and treatment of your esophageal impactions. As you discussed with Dr. Carlean Purl the gastroenterologist please follow a liquid diet tonight and then only eat soft foods such as applesauce beginning tomorrow.  Also please begin taking your proton pump inhibiting medication, Prilosec as previously prescribed.  Get help right away if: You have a fever. You have pain in your chest or your abdomen. You cough up blood. You have blood in your stool (feces) or your vomit. You cannot swallow  You start drooling  Please read the additional information packets attached to your discharge summary.  Do not take your medicine if  develop an itchy rash, swelling in your mouth or lips, or difficulty breathing.

## 2018-12-19 NOTE — Telephone Encounter (Signed)
Thanks Glendell Docker. Vaughan Basta, get patient office visit ASAP and place on daily PPI if not already taking. thanks  jp

## 2018-12-21 NOTE — Telephone Encounter (Signed)
The pt has been advised of the appt with Dr Henrene Pastor for 1/14 at 9 am

## 2018-12-22 ENCOUNTER — Encounter: Payer: Self-pay | Admitting: Internal Medicine

## 2018-12-22 ENCOUNTER — Ambulatory Visit: Payer: Medicare Other | Admitting: Internal Medicine

## 2018-12-22 VITALS — BP 90/60 | HR 76 | Ht 66.0 in | Wt 152.6 lb

## 2018-12-22 DIAGNOSIS — K219 Gastro-esophageal reflux disease without esophagitis: Secondary | ICD-10-CM | POA: Diagnosis not present

## 2018-12-22 DIAGNOSIS — K222 Esophageal obstruction: Secondary | ICD-10-CM

## 2018-12-22 DIAGNOSIS — R1310 Dysphagia, unspecified: Secondary | ICD-10-CM | POA: Diagnosis not present

## 2018-12-22 MED ORDER — OMEPRAZOLE 20 MG PO CPDR: 20.0000 mg | DELAYED_RELEASE_CAPSULE | Freq: Every day | ORAL | 11 refills | Status: DC

## 2018-12-22 NOTE — Progress Notes (Signed)
HISTORY OF PRESENT ILLNESS:  Donna Dillon is a 73 y.o. female with a history of GERD complicated by peptic stricture and food impaction as well as adenomatous colon polyps who presents today after recent problems with transient food impaction.  Patient had a food impaction in 2011 which was removed endoscopically.  Subsequently underwent balloon dilation of the esophagus (Dr. Ardis Hughs).  I last saw the patient March 2017 when she underwent surveillance colonoscopy.  She was found to have sessile serrated polyps for which follow-up in 5 years was recommended.  Patient had been on PPI at some point but nothing in recent years.  She denies classic reflux symptoms.  However she has been having intermittent solid food dysphasia for some time.  4 days ago she had an acute food impaction which sent her to the emergency room.  Blood work at that time was unremarkable.  Hemoglobin 13.0.  Chest x-ray revealed changes of COPD.  Otherwise negative.  She passed her food bolus spontaneously and did not require endoscopic assistance.  My partner contacted me letting me know she had this episode for which we arrange this office evaluation.  Aside from intermittent solid food dysphasia the patient's GI review of systems is negative.  She was recently started on low-dose omeprazole 20 mg.  REVIEW OF SYSTEMS:  All non-GI ROS negative unless otherwise stated in the HPI except for anxiety  Past Medical History:  Diagnosis Date  . Anemia    mild  . Anxiety   . Colon polyp    hyperplastic  . Diverticulosis   . Esophageal stricture   . GERD (gastroesophageal reflux disease)    no s/s now- off medicines  . Hyperlipidemia   . Palpitations   . Prediabetes   . Tachycardia     Past Surgical History:  Procedure Laterality Date  . BREAST BIOPSY Left   . CESAREAN SECTION     x1  . COLONOSCOPY    . eye lift    . POLYPECTOMY    . TONSILLECTOMY    . UPPER GASTROINTESTINAL ENDOSCOPY      Social History Donna Dillon  reports that she has never smoked. She has never used smokeless tobacco. She reports that she does not drink alcohol or use drugs.  family history includes Colon cancer in her paternal aunt; Heart disease in her mother; Hypertension in her mother; Kidney cancer in her father.  Allergies  Allergen Reactions  . Asa [Aspirin] Diarrhea  . Levaquin [Levofloxacin] Other (See Comments)    Joint Pain  . Macrolides And Ketolides Nausea And Vomiting  . Paxil [Paroxetine Hcl] Nausea And Vomiting  . Penicillins Hives    DID THE REACTION INVOLVE: Swelling of the face/tongue/throat, SOB, or low BP? No Sudden or severe rash/hives, skin peeling, or the inside of the mouth or nose? Yes Did it require medical treatment? Yes When did it last happen?Over 10 years ago If all above answers are "NO", may proceed with cephalosporin use.  . Prednisone     Altered mental state  . Tetracyclines & Related Nausea And Vomiting  . Zoloft [Sertraline Hcl] Nausea And Vomiting  . Sulfa Antibiotics Rash  . Trimethoprim Rash       PHYSICAL EXAMINATION: Vital signs: BP 90/60   Pulse 76   Ht 5\' 6"  (1.676 m)   Wt 152 lb 9.6 oz (69.2 kg)   LMP 12/09/1994   BMI 24.63 kg/m   Constitutional: generally well-appearing, no acute distress Psychiatric: alert and oriented  x3, cooperative Eyes: extraocular movements intact, anicteric, conjunctiva pink Mouth: oral pharynx moist, no lesions Neck: supple no lymphadenopathy Cardiovascular: heart regular rate and rhythm, no murmur Lungs: clear to auscultation bilaterally Abdomen: soft, nontender, nondistended, no obvious ascites, no peritoneal signs, normal bowel sounds, no organomegaly Rectal: I did Extremities: no clubbing, cyanosis, or lower extremity edema bilaterally Skin: no lesions on visible extremities Neuro: No focal deficits. No asterixis.  Cranial nerves intact  ASSESSMENT:  1.  Acute food impaction secondary to known esophageal  stricturing. 2.  Esophageal stricturing secondary to GERD 3.  History of adenomatous colon polyps.  Surveillance up-to-date   PLAN:  1.  Continue omeprazole 20 mg daily 2.  Schedule upper endoscopy with esophageal dilation.The nature of the procedure, as well as the risks, benefits, and alternatives were carefully and thoroughly reviewed with the patient. Ample time for discussion and questions allowed. The patient understood, was satisfied, and agreed to proceed. 3.  Surveillance colonoscopy planned around March 2022

## 2018-12-22 NOTE — Patient Instructions (Signed)
We have sent the following medications to your pharmacy for you to pick up at your convenience:  Omeprazole  You have been scheduled for an endoscopy. Please follow written instructions given to you at your visit today. If you use inhalers (even only as needed), please bring them with you on the day of your procedure. Your physician has requested that you go to www.[email or web address].[email or web address] and enter the access code given to you at your visit today. This web site gives a general overview about your procedure. However, you should still follow specific instructions given to you by our office regarding your preparation for the procedure.

## 2018-12-23 ENCOUNTER — Encounter: Payer: Self-pay | Admitting: Internal Medicine

## 2018-12-23 ENCOUNTER — Ambulatory Visit (AMBULATORY_SURGERY_CENTER): Payer: Medicare Other | Admitting: Internal Medicine

## 2018-12-23 VITALS — BP 96/70 | HR 63 | Temp 98.4°F | Resp 11 | Ht 66.0 in | Wt 152.0 lb

## 2018-12-23 DIAGNOSIS — K219 Gastro-esophageal reflux disease without esophagitis: Secondary | ICD-10-CM | POA: Diagnosis not present

## 2018-12-23 DIAGNOSIS — R1310 Dysphagia, unspecified: Secondary | ICD-10-CM

## 2018-12-23 DIAGNOSIS — K2990 Gastroduodenitis, unspecified, without bleeding: Secondary | ICD-10-CM

## 2018-12-23 DIAGNOSIS — K222 Esophageal obstruction: Secondary | ICD-10-CM

## 2018-12-23 DIAGNOSIS — K2970 Gastritis, unspecified, without bleeding: Secondary | ICD-10-CM

## 2018-12-23 MED ORDER — OMEPRAZOLE 40 MG PO CPDR: 40.0000 mg | DELAYED_RELEASE_CAPSULE | Freq: Every day | ORAL | 11 refills | Status: DC

## 2018-12-23 MED ORDER — SODIUM CHLORIDE 0.9 % IV SOLN: 500.0000 mL | Freq: Once | INTRAVENOUS | Status: DC

## 2018-12-23 NOTE — Progress Notes (Signed)
This very nice 73 y.o. MWF presents for 6 month follow up with HTN, HLD, Pre-Diabetes,  and Vitamin D Deficiency. Patient is followed by Dr Clovis Pu for Depression.        Patient has hx/o GERD and on was seen in the ER on 1/10 for an esophageal food impaction resolving with Glucagon. Yesterday (1/15), she had EGD for dilation of an esophageal stricture by Dr Henrene Pastor.      Patient is treated for HTN (1990's) & BP has been controlled at home. Today's BP is at goal -  102/60. Patient has had no complaints of any cardiac type chest pain, palpitations, dyspnea / orthopnea / PND, dizziness, claudication, or dependent edema.     Hyperlipidemia is not controlled with diet & Zetia (statin intolerant). Patient denies myalgias or other med SE's. Last Lipids were not at goal: Lab Results  Component Value Date   CHOL 200 (H) 11/03/2018   HDL 63 11/03/2018   LDLCALC 110 (H) 11/03/2018   TRIG 152 (H) 11/03/2018   CHOLHDL 3.2 11/03/2018      Also, the patient has history of PreDiabetes (A1c 5.8% / 2011) and has had no symptoms of reactive hypoglycemia, diabetic polys, paresthesias or visual blurring.  Last A1c was Normal & at goal: Lab Results  Component Value Date   HGBA1C 5.4 06/09/2018      Further, the patient also has history of Vitamin D Deficiency("36" / 2008)  and supplements vitamin D without any suspected side-effects. Last vitamin D was at goal:  Lab Results  Component Value Date   VD25OH 59 11/03/2018   Current Outpatient Medications on File Prior to Visit  Medication Sig  . atenolol (TENORMIN) 25 MG tablet Take 1 tablet daily for BP (Patient taking differently: Take 12.5 mg by mouth at bedtime. )  . Calcium 1500 MG tablet Take 1,500 mg by mouth daily.   . clomiPRAMINE (ANAFRANIL) 50 MG capsule Take 1 capsule (50 mg total) by mouth at bedtime.  . clonazePAM (KLONOPIN) 2 MG tablet Take 0.5-1 tablets (1-2 mg total) by mouth at bedtime.  Marland Kitchen ezetimibe (ZETIA) 10 MG tablet TAKE 1 TABLET BY  MOUTH  EVERY DAY FOR CHOLESTEROL (Patient taking differently: Take 10 mg by mouth at bedtime. )  . fish oil-omega-3 fatty acids 1000 MG capsule Take 1 g by mouth daily.  . magnesium 30 MG tablet Take 30 mg by mouth daily.   . Multiple Vitamin (MULTIVITAMIN) tablet Take 1 tablet by mouth daily.  . Multiple Vitamins-Minerals (EYE VITAMINS PO) Take 1 capsule by mouth daily. Ocusight Vitamins  . omeprazole (PRILOSEC) 20 MG capsule Take 1 capsule (20 mg total) by mouth daily.  Marland Kitchen omeprazole (PRILOSEC) 40 MG capsule Take 1 capsule (40 mg total) by mouth daily.  Marland Kitchen triamcinolone cream (KENALOG) 0.1 % Apply topically 4 (four) times daily.  . vitamin C (ASCORBIC ACID) 500 MG tablet Take 500 mg by mouth daily.  Marland Kitchen VITAMIN D PO Take 5,000 Units by mouth. Takes 1 to 2 capsules daily.   No current facility-administered medications on file prior to visit.    Allergies  Allergen Reactions  . Asa [Aspirin] Diarrhea  . Levaquin [Levofloxacin] Other (See Comments)    Joint Pain  . Macrolides And Ketolides Nausea And Vomiting  . Paxil [Paroxetine Hcl] Nausea And Vomiting  . Penicillins Hives  . Prednisone     Altered mental state  . Tetracyclines & Related Nausea And Vomiting  . Zoloft [Sertraline Hcl] Nausea  And Vomiting  . Sulfa Antibiotics Rash  . Trimethoprim Rash   PMHx:   Past Medical History:  Diagnosis Date  . Anemia    mild  . Anxiety   . Colon polyp    hyperplastic  . Diverticulosis   . Esophageal stricture   . GERD (gastroesophageal reflux disease)    no s/s now- off medicines  . Hyperlipidemia   . Palpitations   . Prediabetes   . Tachycardia    Immunization History  Administered Date(s) Administered  . Influenza, High Dose Seasonal PF 11/14/2014, 10/10/2016, 12/03/2017, 11/03/2018  . Pneumococcal Conjugate-13 09/21/2015  . Pneumococcal Polysaccharide-23 02/14/2009, 05/13/2017  . Td 12/11/2006, 12/03/2017  . Tdap 12/09/2005  . Zoster 02/27/2011   03/05/2016 - Colon -Dr  Henrene Pastor recc 5 yr f/u due Mar 2022  Past Surgical History:  Procedure Laterality Date  . BREAST BIOPSY Left   . CESAREAN SECTION     x1  . COLONOSCOPY    . eye lift    . POLYPECTOMY    . TONSILLECTOMY    . UPPER GASTROINTESTINAL ENDOSCOPY     FHx:    Reviewed / unchanged  SHx:    Reviewed / unchanged   Systems Review:  Constitutional: Denies fever, chills, wt changes, headaches, insomnia, fatigue, night sweats, change in appetite. Eyes: Denies redness, blurred vision, diplopia, discharge, itchy, watery eyes.  ENT: Denies discharge, congestion, post nasal drip, epistaxis, sore throat, earache, hearing loss, dental pain, tinnitus, vertigo, sinus pain, snoring.  CV: Denies chest pain, palpitations, irregular heartbeat, syncope, dyspnea, diaphoresis, orthopnea, PND, claudication or edema. Respiratory: denies cough, dyspnea, DOE, pleurisy, hoarseness, laryngitis, wheezing.  Gastrointestinal: Denies dysphagia, odynophagia, heartburn, reflux, water brash, abdominal pain or cramps, nausea, vomiting, bloating, diarrhea, constipation, hematemesis, melena, hematochezia  or hemorrhoids. Genitourinary: Denies dysuria, frequency, urgency, nocturia, hesitancy, discharge, hematuria or flank pain. Musculoskeletal: Denies arthralgias, myalgias, stiffness, jt. swelling, pain, limping or strain/sprain.  Skin: Denies pruritus, rash, hives, warts, acne, eczema or change in skin lesion(s). Neuro: No weakness, tremor, incoordination, spasms, paresthesia or pain. Psychiatric: Denies confusion, memory loss or sensory loss. Endo: Denies change in weight, skin or hair change.  Heme/Lymph: No excessive bleeding, bruising or enlarged lymph nodes.  Physical Exam  BP 102/60   Pulse 60   Temp (!) 97.5 F (36.4 C)   Resp 16   Ht 5\' 6"  (1.676 m)   Wt 150 lb 3.2 oz (68.1 kg)   LMP 12/09/1994   BMI 24.24 kg/m   Appears  well nourished, well groomed  and in no distress.  Eyes: PERRLA, EOMs, conjunctiva no  swelling or erythema. Sinuses: No frontal/maxillary tenderness ENT/Mouth: EAC's clear, TM's nl w/o erythema, bulging. Nares clear w/o erythema, swelling, exudates. Oropharynx clear without erythema or exudates. Oral hygiene is good. Tongue normal, non obstructing. Hearing intact.  Neck: Supple. Thyroid not palpable. Car 2+/2+ without bruits, nodes or JVD. Chest: Respirations nl with BS clear & equal w/o rales, rhonchi, wheezing or stridor.  Cor: Heart sounds normal w/ regular rate and rhythm without sig. murmurs, gallops, clicks or rubs. Peripheral pulses normal and equal  without edema.  Abdomen: Soft & bowel sounds normal. Non-tender w/o guarding, rebound, hernias, masses or organomegaly.  Lymphatics: Unremarkable.  Musculoskeletal: Full ROM all peripheral extremities, joint stability, 5/5 strength and normal gait.  Skin: Warm, dry without exposed rashes, lesions or ecchymosis apparent.  Neuro: Cranial nerves intact, reflexes equal bilaterally. Sensory-motor testing grossly intact. Tendon reflexes grossly intact.  Pysch: Alert & oriented x 3.  Insight and judgement nl & appropriate. No ideations.  Assessment and Plan:  1. Essential hypertension  - Continue medication, monitor blood pressure at home.  - Continue DASH diet.  Reminder to go to the ER if any CP,  SOB, nausea, dizziness, severe HA, changes vision/speech.  - CBC with Differential/Platelet - COMPLETE METABOLIC PANEL WITH GFR - Magnesium - TSH  2. Hyperlipidemia, mixed  - Continue diet/meds, exercise,& lifestyle modifications.  - Continue monitor periodic cholesterol/liver & renal functions   - Lipid panel - TSH  3. Abnormal glucose  - Continue diet, exercise  - Lifestyle modifications.  - Monitor appropriate labs.  - Hemoglobin A1c - Insulin, random  4. Vitamin D deficiency  - Continue supplementation.   - VITAMIN D 25 Hydroxyl  5. Prediabetes  - Hemoglobin A1c - Insulin, random  6. Gastroesophageal  reflux disease  - CBC with Differential/Platelet  7. Medication management  - CBC with Differential/Platelet - COMPLETE METABOLIC PANEL WITH GFR - Magnesium - Lipid panel - TSH - Hemoglobin A1c - Insulin, random - VITAMIN D 25 Hydroxyl        Discussed  regular exercise, BP monitoring, weight control to achieve/maintain BMI less than 25 and discussed med and SE's. Recommended labs to assess and monitor clinical status with further disposition pending results of labs. Over 30 minutes of exam, counseling, chart review was performed.

## 2018-12-23 NOTE — Progress Notes (Signed)
Called to room to assist during endoscopic procedure.  Patient ID and intended procedure confirmed with present staff. Received instructions for my participation in the procedure from the performing physician.  

## 2018-12-23 NOTE — Progress Notes (Signed)
PT taken to PACU. Monitors in place. VSS. Report given to RN. 

## 2018-12-23 NOTE — Op Note (Signed)
[facility] Patient Name: Donna Dillon Procedure Date: 12/23/2018 3:07 PM MRN: [id number] Endoscopist: Docia Chuck. Henrene Pastor , MD Age: 73 Referring MD:  Date of Birth: 1946-11-01 Gender: Female Account #: [id number] Procedure:                Upper GI endoscopy with balloon dilation of the                            esophagus; with biopsies Indications:              Dysphagia. History of food impaction requiring                            endoscopic removal in recent food impaction with ER                            visit (impaction passed spontaneously) Medicines:                Monitored Anesthesia Care Procedure:                Pre-Anesthesia Assessment:                           - Prior to the procedure, a History and Physical                            was performed, and patient medications and                            allergies were reviewed. The patient's tolerance of                            previous anesthesia was also reviewed. The risks                            and benefits of the procedure and the sedation                            options and risks were discussed with the patient.                            All questions were answered, and informed consent                            was obtained. Prior Anticoagulants: The patient has                            taken no previous anticoagulant or antiplatelet                            agents. ASA Grade Assessment: II - A patient with                            mild systemic disease. After reviewing the risks  and benefits, the patient was deemed in                            satisfactory condition to undergo the procedure.                           After obtaining informed consent, the endoscope was                            passed under direct vision. Throughout the                            procedure, the patient's blood pressure, pulse, and                            oxygen  saturations were monitored continuously. The                            Endoscope was introduced through the mouth, and                            advanced to the second part of duodenum. The upper                            GI endoscopy was accomplished without difficulty.                            The patient tolerated the procedure well. Scope In: Scope Out: Findings:                 One benign-appearing, intrinsic moderate stenosis                            was found 35 cm from the incisors. This stenosis                            measured 1.5 cm (inner diameter). There was also                            associated esophagitis as manifested by edema and                            friability in the region of the stricture. The                            stenosis was traversed. A TTS dilator was passed                            through the scope. Dilation with an 18-19-20 mm                            balloon dilator was performed to 20 mm.  The stomach revealed a 5 cm hiatal hernia. As well,                            mild antral erythema. Otherwise the stomach was                            normal. Biopsies were taken with a cold forceps for                            Helicobacter pylori testing using CLOtest.                           The examined duodenum was normal.                           The cardia and gastric fundus were normal on                            retroflexion, save hiatal hernia. Complications:            No immediate complications. Estimated Blood Loss:     Estimated blood loss: none. Impression:               1. GERD complicated by peptic stricture with                            esophagitis and large hiatal hernia status post                            dilation                           2. Mild gastritis status post CLO biopsy. Recommendation:           1. Post dilation diet                           2. Prescribe omeprazole 40 mg  daily; #30; 11 refills                           3. Office follow-up with Dr. Henrene Pastor in about 6-8                            weeks. Docia Chuck. Henrene Pastor, MD 12/23/2018 3:34:24 PM This report has been signed electronically.

## 2018-12-23 NOTE — Patient Instructions (Signed)

## 2018-12-23 NOTE — Patient Instructions (Addendum)
Post Dilation Diet Instructions and Prescription sent  YOU HAD AN ENDOSCOPIC PROCEDURE TODAY AT [facility]:   Refer to the procedure report that was given to you for any specific questions about what was found during the examination.  If the procedure report does not answer your questions, please call your gastroenterologist to clarify.  If you requested that your care partner not be given the details of your procedure findings, then the procedure report has been included in a sealed envelope for you to review at your convenience later.  YOU SHOULD EXPECT: Some feelings of bloating in the abdomen. Passage of more gas than usual.  Walking can help get rid of the air that was put into your GI tract during the procedure and reduce the bloating. If you had a lower endoscopy (such as a colonoscopy or flexible sigmoidoscopy) you may notice spotting of blood in your stool or on the toilet paper. If you underwent a bowel prep for your procedure, you may not have a normal bowel movement for a few days.  Please Note:  You might notice some irritation and congestion in your nose or some drainage.  This is from the oxygen used during your procedure.  There is no need for concern and it should clear up in a day or so.  SYMPTOMS TO REPORT IMMEDIATELY:   Following upper endoscopy (EGD)  Vomiting of blood or coffee ground material  New chest pain or pain under the shoulder blades  Painful or persistently difficult swallowing  New shortness of breath  Fever of 100F or higher  Black, tarry-looking stools  For urgent or emergent issues, a gastroenterologist can be reached at any hour by calling [phone].   DIET:  We do recommend a small meal at first, but then you may proceed to your regular diet.  Drink plenty of fluids but you should avoid alcoholic beverages for 24 hours.  ACTIVITY:  You should plan to take it easy for the rest of today and you should NOT DRIVE or use heavy machinery  until tomorrow (because of the sedation medicines used during the test).    FOLLOW UP: Our staff will call the number listed on your records the next business day following your procedure to check on you and address any questions or concerns that you may have regarding the information given to you following your procedure. If we do not reach you, we will leave a message.  However, if you are feeling well and you are not experiencing any problems, there is no need to return our call.  We will assume that you have returned to your regular daily activities without incident.  If any biopsies were taken you will be contacted by phone or by letter within the next 1-3 weeks.  Please call us at [phone] if you have not heard about the biopsies in 3 weeks.    SIGNATURES/CONFIDENTIALITY: You and/or your care partner have signed paperwork which will be entered into your electronic medical record.  These signatures attest to the fact that that the information above on your After Visit Summary has been reviewed and is understood.  Full responsibility of the confidentiality of this discharge information lies with you and/or your care-partner.

## 2018-12-24 ENCOUNTER — Ambulatory Visit: Payer: Medicare Other | Admitting: Internal Medicine

## 2018-12-24 ENCOUNTER — Telehealth: Payer: Self-pay

## 2018-12-24 VITALS — BP 102/60 | HR 60 | Temp 97.5°F | Resp 16 | Ht 66.0 in | Wt 150.2 lb

## 2018-12-24 DIAGNOSIS — K2100 Gastro-esophageal reflux disease with esophagitis, without bleeding: Secondary | ICD-10-CM

## 2018-12-24 DIAGNOSIS — Z79899 Other long term (current) drug therapy: Secondary | ICD-10-CM

## 2018-12-24 DIAGNOSIS — R7309 Other abnormal glucose: Secondary | ICD-10-CM

## 2018-12-24 DIAGNOSIS — E559 Vitamin D deficiency, unspecified: Secondary | ICD-10-CM

## 2018-12-24 DIAGNOSIS — I10 Essential (primary) hypertension: Secondary | ICD-10-CM | POA: Diagnosis not present

## 2018-12-24 DIAGNOSIS — E782 Mixed hyperlipidemia: Secondary | ICD-10-CM | POA: Diagnosis not present

## 2018-12-24 DIAGNOSIS — R7303 Prediabetes: Secondary | ICD-10-CM

## 2018-12-24 NOTE — Telephone Encounter (Signed)
  Follow up Call-  Call back number 12/23/2018  Post procedure Call Back phone  # [phone]  Permission to leave phone message Yes  Some recent data might be hidden     Patient questions:  Do you have a fever, pain , or abdominal swelling? No. Pain Score  0 *  Have you tolerated food without any problems?yes  Have you been able to return to your normal activities? yes  Do you have any questions about your discharge instructions: Diet   No. Medications  No. Follow up visit  No.  Do you have questions or concerns about your Care? No.  Actions: * If pain score is 4 or above: No action needed, pain <4.

## 2018-12-24 NOTE — Telephone Encounter (Signed)
Left message

## 2018-12-25 LAB — INSULIN, RANDOM: Insulin: 18.6 u[IU]/mL (ref 2.0–19.6)

## 2018-12-25 LAB — CBC WITH DIFFERENTIAL/PLATELET
Absolute Monocytes: 496 cells/uL (ref 200–950)
BASOS PCT: 0.7 %
Basophils Absolute: 48 cells/uL (ref 0–200)
Eosinophils Absolute: 218 cells/uL (ref 15–500)
Eosinophils Relative: 3.2 %
HCT: 40.5 % (ref 35.0–45.0)
Hemoglobin: 13.7 g/dL (ref 11.7–15.5)
Lymphs Abs: 2224 cells/uL (ref 850–3900)
MCH: 29.6 pg (ref 27.0–33.0)
MCHC: 33.8 g/dL (ref 32.0–36.0)
MCV: 87.5 fL (ref 80.0–100.0)
MPV: 9.7 fL (ref 7.5–12.5)
Monocytes Relative: 7.3 %
Neutro Abs: 3815 cells/uL (ref 1500–7800)
Neutrophils Relative %: 56.1 %
Platelets: 252 10*3/uL (ref 140–400)
RBC: 4.63 10*6/uL (ref 3.80–5.10)
RDW: 12.7 % (ref 11.0–15.0)
Total Lymphocyte: 32.7 %
WBC: 6.8 10*3/uL (ref 3.8–10.8)

## 2018-12-25 LAB — LIPID PANEL
CHOL/HDL RATIO: 3.2 (calc) (ref <5.0)
Cholesterol: 205 mg/dL — ABNORMAL HIGH (ref <200)
HDL: 64 mg/dL (ref >50)
LDL Cholesterol (Calc): 111 mg/dL (calc) — ABNORMAL HIGH
Non-HDL Cholesterol (Calc): 141 mg/dL (calc) — ABNORMAL HIGH (ref <130)
Triglycerides: 186 mg/dL — ABNORMAL HIGH (ref <150)

## 2018-12-25 LAB — COMPLETE METABOLIC PANEL WITH GFR
AG Ratio: 2 (calc) (ref 1.0–2.5)
ALBUMIN MSPROF: 4.5 g/dL (ref 3.6–5.1)
ALT: 17 U/L (ref 6–29)
AST: 10 U/L (ref 10–35)
Alkaline phosphatase (APISO): 67 U/L (ref 33–130)
BUN: 10 mg/dL (ref 7–25)
CO2: 29 mmol/L (ref 20–32)
Calcium: 9.4 mg/dL (ref 8.6–10.4)
Chloride: 103 mmol/L (ref 98–110)
Creat: 0.74 mg/dL (ref 0.60–0.93)
GFR, Est African American: 94 mL/min/{1.73_m2} (ref >=60)
GFR, Est Non African American: 82 mL/min/{1.73_m2} (ref >=60)
Globulin: 2.2 g/dL (calc) (ref 1.9–3.7)
Glucose, Bld: 91 mg/dL (ref 65–99)
Potassium: 3.8 mmol/L (ref 3.5–5.3)
SODIUM: 139 mmol/L (ref 135–146)
TOTAL PROTEIN: 6.7 g/dL (ref 6.1–8.1)
Total Bilirubin: 0.5 mg/dL (ref 0.2–1.2)

## 2018-12-25 LAB — TSH: TSH: 1.33 mIU/L (ref 0.40–4.50)

## 2018-12-25 LAB — MAGNESIUM: Magnesium: 1.9 mg/dL (ref 1.5–2.5)

## 2018-12-25 LAB — HEMOGLOBIN A1C
HEMOGLOBIN A1C: 5.5 %{Hb} (ref <5.7)
Mean Plasma Glucose: 111 (calc)
eAG (mmol/L): 6.2 (calc)

## 2018-12-25 LAB — VITAMIN D 25 HYDROXY (VIT D DEFICIENCY, FRACTURES): Vit D, 25-Hydroxy: 50 ng/mL (ref 30–100)

## 2018-12-25 LAB — HELICOBACTER PYLORI SCREEN-BIOPSY: UREASE: NEGATIVE

## 2018-12-27 ENCOUNTER — Encounter: Payer: Self-pay | Admitting: Internal Medicine

## 2018-12-31 ENCOUNTER — Telehealth: Payer: Self-pay | Admitting: Psychiatry

## 2018-12-31 DIAGNOSIS — F32A Depression, unspecified: Secondary | ICD-10-CM

## 2018-12-31 DIAGNOSIS — F329 Major depressive disorder, single episode, unspecified: Secondary | ICD-10-CM

## 2018-12-31 MED ORDER — CLOMIPRAMINE HCL 50 MG PO CAPS: 50.0000 mg | ORAL_CAPSULE | Freq: Every day | ORAL | 3 refills | Status: DC

## 2018-12-31 NOTE — Telephone Encounter (Signed)
Submitted to [facility] per pt request

## 2018-12-31 NOTE — Telephone Encounter (Signed)
Requesting refill for anafranil be sent to her mail order for a 90 day supply [facility]

## 2018-12-31 NOTE — Telephone Encounter (Signed)
Stated she needed a new prescription for Anafranil. Stated it was not filled with her other meds

## 2019-01-05 ENCOUNTER — Telehealth: Payer: Self-pay

## 2019-01-05 NOTE — Telephone Encounter (Signed)
Prior authorization approved on Anafranil 50 mg capsule per [employer] Medicare part D effective 01/03/2019-12/09/2019 Faxed [phone] [facility] [phone]

## 2019-01-05 NOTE — Telephone Encounter (Signed)
Prior authorization approved on Anafranil 50 mg capsule per [employer] Medicare part D effective 01/03/2019-12/09/2019 Faxed [phone] [facility] [phone]

## 2019-02-03 ENCOUNTER — Other Ambulatory Visit: Payer: Self-pay

## 2019-02-03 ENCOUNTER — Telehealth: Payer: Self-pay | Admitting: Internal Medicine

## 2019-02-03 DIAGNOSIS — R1310 Dysphagia, unspecified: Secondary | ICD-10-CM

## 2019-02-03 NOTE — Telephone Encounter (Signed)
Spoke with pt and she is scheduled for egd with dil in the [facility] [place] 02/04/19@1 :30pm. Pt to arrive at 12:30pm and have clear liquids after midnight up until 10:30am. Pt aware of appt.

## 2019-02-03 NOTE — Telephone Encounter (Signed)
Pt had EGD with dil done in January. States yesterday evening she tried to swallow a pill and it got stuck. She swallowed some apple to try and help the pill go down but it did not go. Reports she walked around and drank some liquids for about 20 min and finally it went down. Pt called to let Dr. Henrene Pastor know, wonders if she needs another dil. Please advise.

## 2019-02-03 NOTE — Telephone Encounter (Signed)
Vaughan Basta, she does need re-dilated.  I have an opening tomorrow at 130.  She should stay on full liquids until then to avoid interval food impaction.  Make sure she is taking her PPI daily.  Thanks

## 2019-02-04 ENCOUNTER — Encounter: Payer: Self-pay | Admitting: Internal Medicine

## 2019-02-04 ENCOUNTER — Ambulatory Visit (AMBULATORY_SURGERY_CENTER): Payer: Medicare Other | Admitting: Internal Medicine

## 2019-02-04 VITALS — BP 106/62 | HR 58 | Temp 97.5°F | Resp 12 | Ht 66.0 in | Wt 150.0 lb

## 2019-02-04 DIAGNOSIS — K219 Gastro-esophageal reflux disease without esophagitis: Secondary | ICD-10-CM | POA: Diagnosis not present

## 2019-02-04 DIAGNOSIS — K222 Esophageal obstruction: Secondary | ICD-10-CM | POA: Diagnosis not present

## 2019-02-04 DIAGNOSIS — R1310 Dysphagia, unspecified: Secondary | ICD-10-CM

## 2019-02-04 DIAGNOSIS — K200 Eosinophilic esophagitis: Secondary | ICD-10-CM

## 2019-02-04 DIAGNOSIS — K208 Other esophagitis: Secondary | ICD-10-CM

## 2019-02-04 MED ORDER — SODIUM CHLORIDE 0.9 % IV SOLN: 500.0000 mL | Freq: Once | INTRAVENOUS | Status: DC

## 2019-02-04 NOTE — Patient Instructions (Addendum)
YOU HAD AN ENDOSCOPIC PROCEDURE TODAY AT [facility]:   Refer to the procedure report that was given to you for any specific questions about what was found during the examination.  If the procedure report does not answer your questions, please call your gastroenterologist to clarify.  If you requested that your care partner not be given the details of your procedure findings, then the procedure report has been included in a sealed envelope for you to review at your convenience later.  YOU SHOULD EXPECT: Some feelings of bloating in the abdomen. Passage of more gas than usual.  Walking can help get rid of the air that was put into your GI tract during the procedure and reduce the bloating. If you had a lower endoscopy (such as a colonoscopy or flexible sigmoidoscopy) you may notice spotting of blood in your stool or on the toilet paper. If you underwent a bowel prep for your procedure, you may not have a normal bowel movement for a few days.  Please Note:  You might notice some irritation and congestion in your nose or some drainage.  This is from the oxygen used during your procedure.  There is no need for concern and it should clear up in a day or so.  SYMPTOMS TO REPORT IMMEDIATELY:   Following upper endoscopy (EGD)  Vomiting of blood or coffee ground material  New chest pain or pain under the shoulder blades  Painful or persistently difficult swallowing  New shortness of breath  Fever of 100F or higher  Black, tarry-looking stools  For urgent or emergent issues, a gastroenterologist can be reached at any hour by calling [phone].   DIET:  We do recommend a post dilation diet..  Drink plenty of fluids but you should avoid alcoholic beverages for 24 hours.  ACTIVITY:  You should plan to take it easy for the rest of today and you should NOT DRIVE or use heavy machinery until tomorrow (because of the sedation medicines used during the test).    FOLLOW UP: Our staff  will call the number listed on your records the next business day following your procedure to check on you and address any questions or concerns that you may have regarding the information given to you following your procedure. If we do not reach you, we will leave a message.  However, if you are feeling well and you are not experiencing any problems, there is no need to return our call.  We will assume that you have returned to your regular daily activities without incident.  If any biopsies were taken you will be contacted by phone or by letter within the next 1-3 weeks.  Please call us at [phone] if you have not heard about the biopsies in 3 weeks.   Await for biopsy results Esophagitis and Stricture (handout given) Post Dilation Diet (handout given) Follow up with Dr. Henrene Pastor in 6 moths for a office visit Follow up with Dr. Henrene Pastor if having further swallowing problems  SIGNATURES/CONFIDENTIALITY: You and/or your care partner have signed paperwork which will be entered into your electronic medical record.  These signatures attest to the fact that that the information above on your After Visit Summary has been reviewed and is understood.  Full responsibility of the confidentiality of this discharge information lies with you and/or your care-partner.

## 2019-02-04 NOTE — Progress Notes (Signed)
PT taken to PACU. Monitors in place. VSS. Report given to RN. 

## 2019-02-04 NOTE — Progress Notes (Signed)
Called to room to assist during endoscopic procedure.  Patient ID and intended procedure confirmed with present staff. Received instructions for my participation in the procedure from the performing physician.  

## 2019-02-04 NOTE — Op Note (Signed)
[place] Patient Name: Donna Dillon Procedure Date: 02/04/2019 1:05 PM MRN: [id number] Endoscopist: Docia Chuck. Henrene Pastor , MD Age: 73 Referring MD:  Date of Birth: [birth year] Gender: Female Account #: [id number] Procedure:                Upper GI endoscopy, with biopsies; with balloon                            dilation esophagus 20 mm Indications:              Dysphagia, Therapeutic procedure. Last endoscopy                            with dilation December 23, 2018. Significant recent                            pill dysphasia with transient lodgment Medicines:                Monitored Anesthesia Care Procedure:                Pre-Anesthesia Assessment:                           - Prior to the procedure, a History and Physical                            was performed, and patient medications and                            allergies were reviewed. The patient's tolerance of                            previous anesthesia was also reviewed. The risks                            and benefits of the procedure and the sedation                            options and risks were discussed with the patient.                            All questions were answered, and informed consent                            was obtained. Prior Anticoagulants: The patient has                            taken no previous anticoagulant or antiplatelet                            agents. ASA Grade Assessment: II - A patient with                            mild systemic disease. After reviewing the risks  and benefits, the patient was deemed in                            satisfactory condition to undergo the procedure.                           After obtaining informed consent, the endoscope was                            passed under direct vision. Throughout the                            procedure, the patient's blood pressure, pulse, and                            oxygen  saturations were monitored continuously. The                            Endoscope was introduced through the mouth, and                            advanced to the second part of duodenum. The upper                            GI endoscopy was accomplished without difficulty.                            The patient tolerated the procedure well. Scope In: Scope Out: Findings:                 One benign-appearing, intrinsic moderate stenosis                            was found 35 cm from the incisors. This stenosis                            measured 1.5 cm (inner diameter). This was densely                            fibrotic with some mild inflammation. Multiple                            biopsies of the stricture were taken for                            disruption. The tissue was sent pathology.                            Subsequently, a half is not here TTS dilator was                            passed through the scope. Dilation with an 18-19-20  mm balloon dilator was performed to 20 mm. .                           The exam of the esophagus was otherwise normal.                           The stomach was normal save a moderate sliding                            hiatal hernia.                           The examined duodenum was normal.                           The cardia and gastric fundus were normal on                            retroflexion. Complications:            No immediate complications. Estimated Blood Loss:     Estimated blood loss: none. Impression:               1. GERD with esophageal stricture status post                            biopsies and balloon dilation to 20 mm. Recommendation:           - Patient has a contact number available for                            emergencies. The signs and symptoms of potential                            delayed complications were discussed with the                            patient. Return to normal  activities tomorrow.                            Written discharge instructions were provided to the                            patient.                           - Post dilation diet.                           - Continue present medications.                           - Contact Dr. Henrene Pastor if you have any further                            problems with swallowing. Otherwise, routine office  follow-up in about 6 months Docia Chuck. Henrene Pastor, MD 02/04/2019 1:56:02 PM This report has been signed electronically.

## 2019-02-05 ENCOUNTER — Telehealth: Payer: Self-pay | Admitting: *Deleted

## 2019-02-05 NOTE — Telephone Encounter (Signed)
Left message on f/u call 

## 2019-02-05 NOTE — Telephone Encounter (Signed)
  Follow up Call-  Call back number 02/04/2019 12/23/2018  Post procedure Call Back phone  # [phone]  Permission to leave phone message Yes Yes  Some recent data might be hidden     Patient questions:  Do you have a fever, pain , or abdominal swelling? No. Pain Score  0 *  Have you tolerated food without any problems? Yes.    Have you been able to return to your normal activities? Yes.    Do you have any questions about your discharge instructions: Diet   No. Medications  No. Follow up visit  No.  Do you have questions or concerns about your Care? No.  Actions: * If pain score is 4 or above: No action needed, pain <4.

## 2019-02-05 NOTE — Telephone Encounter (Signed)
Left mess

## 2019-02-09 ENCOUNTER — Encounter: Payer: Self-pay | Admitting: Internal Medicine

## 2019-03-21 ENCOUNTER — Other Ambulatory Visit: Payer: Self-pay | Admitting: Internal Medicine

## 2019-04-07 ENCOUNTER — Telehealth: Payer: Self-pay | Admitting: *Deleted

## 2019-04-07 NOTE — Telephone Encounter (Signed)
Spoke with the patient in regard for her upcoming AWV on 04/09/2019.

## 2019-04-08 NOTE — Progress Notes (Signed)
Virtual Visit via Telephone Note  I connected with Donna Dillon on 04/09/19 at 11:00 AM EDT by telephone and verified that I am speaking with the correct person using two identifiers.   I discussed the limitations, risks, security and privacy concerns of performing an evaluation and management service by telephone and the availability of in person appointments. I also discussed with the patient that there may be a patient responsible charge related to this service. The patient expressed understanding and agreed to proceed.   MEDICARE ANNUAL WELLNESS VISIT AND FOLLOW UP Assessment:   Diagnoses and all orders for this visit:  Medicare annual wellness visit, subsequent Yearly  Essential hypertension / tachycardia Hypertension Continue medication:atenolol 25mg , half tablet, 12.5mg , daily. Monitor blood pressure at home; call if consistently over 130/80 Continue DASH diet.   Reminder to go to the ER if any CP, SOB, nausea, dizziness, severe HA, changes vision/speech, left arm numbness and tingling and jaw pain.  Gastroesophageal reflux disease, esophagitis presence not specified Doing well on current regiment Continue omeprazole 40mg  daily  ESOPHAGEAL STRICTURE Doing well at this time Continue to monitor symptoms Follows with Dr Henrene Pastor Q6 months  Osteopenia, unspecified location Taking Calcium and Vitamin D DEXA scan due, referral placed Schedule once COVID19 restrictions lifted.  Mixed hyperlipidemia Cholesterol Continue medications: Zetia 10mg , fish oil 1,000mg  daily Continue low cholesterol diet and exercise.  Check lipid panel next visit  Mixed obsession thoughts and acts Doing well at this time Taking anafranil 50mg  HS Clonazepam 1mg  HS Follows with Dr Clovis Pu   Vitamin D deficiency Continue supplementation Will check level next office visit  Abnormal glucose Discussed dietary and exercise modifications  BMI 23.0-23.9, adult  Screening for  Osteoporosis Referral for DEXA   Follow Up Instructions:    I discussed the assessment and treatment plan with the patient. The patient was provided an opportunity to ask questions and all were answered. The patient agreed with the plan and demonstrated an understanding of the instructions.   The patient was advised to call back or seek an in-person evaluation if the symptoms worsen or if the condition fails to improve as anticipated.  I provided 30 minutes of non-face-to-face time during this encounter including counseling, chart review, and critical decision making was preformed.   Future Appointments  Date Time Provider Wall  05/13/2019  4:30 PM Cottle, Billey Co., MD [facility] None  07/12/2019  3:00 PM Unk Pinto, MD [facility] None      Plan:   During the course of the visit the patient was educated and counseled about appropriate screening and preventive services including:    Pneumococcal vaccine   Influenza vaccine  Prevnar 13  Td vaccine  Screening electrocardiogram, deferred, telephone visit [place].  Colorectal cancer screening  Diabetes screening  Glaucoma screening  Nutrition counseling    Subjective:  Donna Dillon is a 73 y.o. female who presents for Medicare Annual Wellness Visit and 3 month follow up for HTN/tachycardia, GERD, hyperlipidemia, abnormal glucose, OCD, histroy of esophageal stricture, osteopenia, and vitamin D Def.  History of hypertension that predates 1990 Her blood pressure has been controlled at home, today their BP is BP: 108/69  Donna Dillon does workout. Donna Dillon denies chest pain, shortness of breath, dizziness.   Donna Dillon is on cholesterol medication and denies myalgias. Donna Dillon is taking zetia, side effects with statins, unable to tolerate. Her cholesterol is not at goal. The cholesterol last visit was:   Lab Results  Component Value Date   CHOL  205 (H) 12/24/2018   HDL 64 12/24/2018   LDLCALC 111 (H) 12/24/2018   TRIG 186 (H)  12/24/2018   CHOLHDL 3.2 12/24/2018   Donna Dillon has been working on diet and exercise for prediabetes (A1c 5.8%, 2011), and denies hyperglycemia, hypoglycemia , increased appetite, nausea, paresthesia of the feet, polydipsia, polyuria, visual disturbances and vomiting. Last A1C in the office was:  Lab Results  Component Value Date   HGBA1C 5.5 12/24/2018   Last GFR Lab Results  Component Value Date   GFRNONAA 82 12/24/2018     Lab Results  Component Value Date   GFRAA 94 12/24/2018   Patient is on Vitamin D supplement.  Deficiency noted (36/2008).  Last Vitamin D level was in normal range but below goal of 70-90. Lab Results  Component Value Date   VD25OH 50 12/24/2018      Medication Review:    Current Outpatient Medications (Cardiovascular):  .  atenolol (TENORMIN) 25 MG tablet, Take half tablet, 12.5mg  daily for blood pressure. .  ezetimibe (ZETIA) 10 MG tablet, Take 1 tablet daily for Cholesterol         Current Outpatient Medications (Other):  Marland Kitchen  Calcium 1500 MG tablet, Take 1,500 mg by mouth daily.  .  clomiPRAMINE (ANAFRANIL) 50 MG capsule, Take 1 capsule (50 mg total) by mouth at bedtime. .  clonazePAM (KLONOPIN) 2 MG tablet, Take 0.5-1 tablets (1-2 mg total) by mouth at bedtime. .  fish oil-omega-3 fatty acids 1000 MG capsule, Take 1 g by mouth daily. .  magnesium 30 MG tablet, Take 30 mg by mouth daily.  .  Multiple Vitamin (MULTIVITAMIN) tablet, Take 1 tablet by mouth daily. .  Multiple Vitamins-Minerals (EYE VITAMINS PO), Take 1 capsule by mouth daily. Ocusight Vitamins .  omeprazole (PRILOSEC) 40 MG capsule, Take 1 capsule (40 mg total) by mouth daily. Marland Kitchen  triamcinolone cream (KENALOG) 0.1 %, Apply topically 4 (four) times daily. (Patient not taking: Reported on 02/04/2019) .  vitamin C (ASCORBIC ACID) 500 MG tablet, Take 500 mg by mouth daily. Marland Kitchen  VITAMIN D PO, Take 5,000 Units by mouth. Takes 1 to 2 capsules daily.  Current Facility-Administered Medications  (Other):  .  0.9 %  sodium chloride infusion  Allergies: Allergies  Allergen Reactions  . Prednisone     Altered mental state  . Penicillins   . Asa [Aspirin] Diarrhea  . Levaquin [Levofloxacin] Other (See Comments)    Joint Pain  . Macrolides And Ketolides Nausea And Vomiting  . Paxil [Paroxetine Hcl] Nausea And Vomiting  . Tetracyclines & Related Nausea And Vomiting  . Zoloft [Sertraline Hcl] Nausea And Vomiting  . Sulfa Antibiotics Rash  . Trimethoprim Rash    Current Problems (verified) has ESOPHAGEAL STRICTURE; GERD; PERSONAL HX COLONIC POLYPS; Osteopenia; Hyperlipidemia; Tachycardia; Abnormal glucose; Vitamin D deficiency; BMI 24.0-24.9, adult; FHx: heart disease; OCD (obsessive compulsive disorder); Flying phobia; Screening for osteoporosis; and Essential hypertension on their problem list.  Screening Tests Immunization History  Administered Date(s) Administered  . Influenza, High Dose Seasonal PF 11/14/2014, 10/10/2016, 12/03/2017, 11/03/2018  . Pneumococcal Conjugate-13 09/21/2015  . Pneumococcal Polysaccharide-23 02/14/2009, 05/13/2017  . Td 12/11/2006, 12/03/2017  . Tdap 12/09/2005  . Zoster 02/27/2011    Preventative care: Last colonoscopy: 2017, Dr Henrene Pastor Mammogram: 2019 DEXA: 2014, osteopenia DUE for 2020. Pap: 2016  Prior vaccinations: TD or Tdap: 2018  Influenza: 2018  Pneumococcal: 2018 Prevnar13: 2016 Shingles/Zostavax: 2012  Names of Other Physician/Practitioners you currently use: 1. [facility] here for primary care 2. Eye Exam, Dr Katy Fitch, 2017, Due for 2020 3.Dental Exam, Dr Barrie Dunker, 2018, Due for 2020  Patient Care Team: Unk Pinto, MD as PCP - General (Internal Medicine) Clent Jacks, MD as Consulting Physician (Ophthalmology) Irene Shipper, MD as Consulting Physician (Gastroenterology) Milus Banister, MD as Attending Physician (Gastroenterology) Megan Salon, MD as Consulting Physician  (Gynecology)  Surgical: Donna Dillon  has a past surgical history that includes Tonsillectomy; Breast biopsy (Left); Cesarean section; Upper gastrointestinal endoscopy; Polypectomy; Colonoscopy; and eye lift. Family Her family history includes Colon cancer in her paternal aunt; Heart disease in her mother; Hypertension in her mother; Kidney cancer in her father. Social history  Donna Dillon reports that Donna Dillon has never smoked. Donna Dillon has never used smokeless tobacco. Donna Dillon reports that Donna Dillon does not drink alcohol or use drugs.  MEDICARE WELLNESS OBJECTIVES: Physical activity: Current Exercise Habits: Home exercise routine, Type of exercise: calisthenics;Other - see comments(Goes jogging daily ), Time (Minutes): 30, Frequency (Times/Week): 7, Weekly Exercise (Minutes/Week): 210, Intensity: Moderate, Exercise limited by: None identified Cardiac risk factors: Cardiac Risk Factors include: advanced age (>25men, >38 women);dyslipidemia Depression/mood screen:   Depression screen [facility] 2/9 04/09/2019  Decreased Interest 0  Down, Depressed, Hopeless 0  PHQ - 2 Score 0    ADLs:  In your present state of health, do you have any difficulty performing the following activities: 04/09/2019 12/27/2018  Hearing? N N  Vision? N N  Difficulty concentrating or making decisions? N N  Walking or climbing stairs? N N  Dressing or bathing? N N  Doing errands, shopping? N N  Preparing Food and eating ? N -  Using the Toilet? N -  In the past six months, have you accidently leaked urine? N -  Do you have problems with loss of bowel control? N -  Managing your Medications? N -  Managing your Finances? N -  Housekeeping or managing your Housekeeping? N -  Some recent data might be hidden     Cognitive Testing  Alert? Yes  Normal Appearance?Yes  Oriented to person? Yes  Place? Yes   Time? Yes  Recall of three objects?  Yes  Can perform simple calculations? Yes  Displays appropriate judgment?Yes  Can read the correct time from a  watch face?Yes  EOL planning: Does Patient Have a Medical Advance Directive?: Yes Does patient want to make changes to medical advance directive?: No - Patient declined   Objective:   Today's Vitals   04/09/19 1127  BP: 108/69  Pulse: 68  Temp: (!) 97.4 F (36.3 C)  Weight: 148 lb 8 oz (67.4 kg)  Height: 5\' 6"  (1.676 m)   Body mass index is 23.97 kg/m.  General : Well sounding patient in no apparent distress HEENT: no hoarseness, no cough for duration of visit Lungs: speaks in complete sentences, no audible wheezing, no apparent distress Neurological: alert, oriented x 3 Psychiatric: pleasant, judgement appropriate   Medicare Attestation I have personally reviewed: The patient's medical and social history Their use of alcohol, tobacco or illicit drugs Their current medications and supplements The patient's functional ability including ADLs,fall risks, home safety risks, cognitive, and hearing and visual impairment Diet and physical activities Evidence for depression or mood disorders  The patient's weight, height, BMI, and visual acuity have been recorded in the chart.  I have made referrals, counseling, and provided education to the patient based on review of the above and I have provided the patient with a written personalized  care plan for preventive services.     Garnet Sierras, NP [facility] 04/09/2019  4:55 PM

## 2019-04-09 ENCOUNTER — Ambulatory Visit: Payer: Medicare Other | Admitting: Adult Health Nurse Practitioner

## 2019-04-09 ENCOUNTER — Other Ambulatory Visit: Payer: Self-pay

## 2019-04-09 ENCOUNTER — Encounter: Payer: Self-pay | Admitting: Adult Health Nurse Practitioner

## 2019-04-09 VITALS — BP 108/69 | HR 68 | Temp 97.4°F | Ht 66.0 in | Wt 148.5 lb

## 2019-04-09 DIAGNOSIS — E782 Mixed hyperlipidemia: Secondary | ICD-10-CM

## 2019-04-09 DIAGNOSIS — R6889 Other general symptoms and signs: Secondary | ICD-10-CM

## 2019-04-09 DIAGNOSIS — Z0001 Encounter for general adult medical examination with abnormal findings: Secondary | ICD-10-CM

## 2019-04-09 DIAGNOSIS — I10 Essential (primary) hypertension: Secondary | ICD-10-CM | POA: Insufficient documentation

## 2019-04-09 DIAGNOSIS — R000 Tachycardia, unspecified: Secondary | ICD-10-CM | POA: Diagnosis not present

## 2019-04-09 DIAGNOSIS — K219 Gastro-esophageal reflux disease without esophagitis: Secondary | ICD-10-CM

## 2019-04-09 DIAGNOSIS — K222 Esophageal obstruction: Secondary | ICD-10-CM

## 2019-04-09 DIAGNOSIS — E559 Vitamin D deficiency, unspecified: Secondary | ICD-10-CM

## 2019-04-09 DIAGNOSIS — M8580 Other specified disorders of bone density and structure, unspecified site: Secondary | ICD-10-CM

## 2019-04-09 DIAGNOSIS — Z0000 Encounter for general adult medical examination without abnormal findings: Secondary | ICD-10-CM

## 2019-04-09 DIAGNOSIS — F422 Mixed obsessional thoughts and acts: Secondary | ICD-10-CM

## 2019-04-09 DIAGNOSIS — Z13820 Encounter for screening for osteoporosis: Secondary | ICD-10-CM

## 2019-04-09 DIAGNOSIS — Z6823 Body mass index (BMI) 23.0-23.9, adult: Secondary | ICD-10-CM

## 2019-04-09 DIAGNOSIS — R7309 Other abnormal glucose: Secondary | ICD-10-CM

## 2019-04-09 MED ORDER — ATENOLOL 25 MG PO TABS: ORAL_TABLET | ORAL | 1 refills | Status: DC

## 2019-04-09 NOTE — Patient Instructions (Addendum)
Today you had a telephone visit with Donna Sierras, DNP.  Below is a summary of your visit.   Donna Dillon , Thank you for taking time to come for your Medicare Wellness Visit. I appreciate your ongoing commitment to your health goals. Please review the following plan we discussed and let me know if I can assist you in the future.  This is a list of the screening recommended for you and due dates:  Health Maintenance  Topic Date Due  .  Hepatitis C: One time screening is recommended by Center for Disease Control  (CDC) for  adults born from [birth year] through 1964.   DUE next office visit  . Mammogram  Due for 2020  . Flu Shot  07/10/2019  . Colon Cancer Screening  03/05/2021  . Tetanus Vaccine  12/04/2027  . DEXA scan (bone density measurement)  Due for 2020  . Pneumonia vaccines  Completed   We have placed an order for your Bone Density, DEX, scan.  After the COVID19 restrictions are lifted.  Call to schedule your mammogram and bone density screenings. Eye and dental exams are due this year, when avilable.  You have had the Zostavax vaccination for shingles.  It is recommended that you received Shingrix vaccination to further protect you from this.  Below if information about the vaccination.  Ask insurance and pharmacy about shingrix - new vaccine   Can go to [email or web address] for more information  Shingrix Vaccination  Two vaccines are licensed and recommended to prevent shingles in the [place] Zoster vaccine live (ZVL, Zostavax) has been in use since 2006. Recombinant zoster vaccine (RZV, Shingrix), has been in use since 2017 and is recommended by ACIP as the preferred shingles vaccine.  What Everyone Should Know about Shingles Vaccine (Shingrix) One of the Recommended Vaccines by Disease Shingles vaccination is the only way to protect against shingles and postherpetic neuralgia (PHN), the most common complication from shingles. CDC  recommends that healthy adults 50 years and older get two doses of the shingles vaccine called Shingrix (recombinant zoster vaccine), separated by 2 to 6 months, to prevent shingles and the complications from the disease. Your doctor or pharmacist can give you Shingrix as a shot in your upper arm. Shingrix provides strong protection against shingles and PHN. Two doses of Shingrix is more than 90% effective at preventing shingles and PHN. Protection stays above 85% for at least the first four years after you get vaccinated. Shingrix is the preferred vaccine, over Zostavax (zoster vaccine live), a shingles vaccine in use since 2006. Zostavax may still be used to prevent shingles in healthy adults 60 years and older. For example, you could use Zostavax if a person is allergic to Shingrix, prefers Zostavax, or requests immediate vaccination and Shingrix is unavailable. Who Should Get Shingrix? Healthy adults 50 years and older should get two doses of Shingrix, separated by 2 to 6 months. You should get Shingrix even if in the past you . had shingles  . received Zostavax  . are not sure if you had chickenpox There is no maximum age for getting Shingrix. If you had shingles in the past, you can get Shingrix to help prevent future occurrences of the disease. There is no specific length of time that you need to wait after having shingles before you can receive Shingrix, but generally you should make sure the shingles rash has gone away before getting vaccinated. You can get Shingrix whether or not you remember having had chickenpox in the  past. Studies show that more than 99% of Americans 40 years and older have had chickenpox, even if they don't remember having the disease. Chickenpox and shingles are related because they are caused by the same virus (varicella zoster virus). After a person recovers from chickenpox, the virus stays dormant (inactive) in the body. It can reactivate years later and cause  shingles. If you had Zostavax in the recent past, you should wait at least eight weeks before getting Shingrix. Talk to your healthcare provider to determine the best time to get Shingrix. Shingrix is available in [facility] and pharmacies. To find doctor's offices or pharmacies near you that offer the vaccine, visit [facility] Vaccine FinderExternal. If you have questions about Shingrix, talk with your healthcare provider. Vaccine for Those 6 Years and Older  Shingrix reduces the risk of shingles and PHN by more than 90% in people 36 and older. CDC recommends the vaccine for healthy adults 72 and older.  Who Should Not Get Shingrix? You should not get Shingrix if you: . have ever had a severe allergic reaction to any component of the vaccine or after a dose of Shingrix  . tested negative for immunity to varicella zoster virus. If you test negative, you should get chickenpox vaccine.  . currently have shingles  . currently are pregnant or breastfeeding. Women who are pregnant or breastfeeding should wait to get Shingrix.  Marland Kitchen receive specific antiviral drugs (acyclovir, famciclovir, or valacyclovir) 24 hours before vaccination (avoid use of these antiviral drugs for 14 days after vaccination)- zoster vaccine live only If you have a minor acute (starts suddenly) illness, such as a cold, you may get Shingrix. But if you have a moderate or severe acute illness, you should usually wait until you recover before getting the vaccine. This includes anyone with a temperature of 101.83F or higher. The side effects of the Shingrix are temporary, and usually last 2 to 3 days. While you may experience pain for a few days after getting Shingrix, the pain will be less severe than having shingles and the complications from the disease. How Well Does Shingrix Work? Two doses of Shingrix provides strong protection against shingles and postherpetic neuralgia (PHN), the most common complication of shingles. . In  adults 35 to 72 years old who got two doses, Shingrix was 97% effective in preventing shingles; among adults 70 years and older, Shingrix was 91% effective.  . In adults 41 to 72 years old who got two doses, Shingrix was 91% effective in preventing PHN; among adults 70 years and older, Shingrix was 89% effective. Shingrix protection remained high (more than 85%) in people 70 years and older throughout the four years following vaccination. Since your risk of shingles and PHN increases as you get older, it is important to have strong protection against shingles in your older years. Top of Page  What Are the Possible Side Effects of Shingrix? Studies show that Shingrix is safe. The vaccine helps your body create a strong defense against shingles. As a result, you are likely to have temporary side effects from getting the shots. The side effects may affect your ability to do normal daily activities for 2 to 3 days. Most people got a sore arm with mild or moderate pain after getting Shingrix, and some also had redness and swelling where they got the shot. Some people felt tired, had muscle pain, a headache, shivering, fever, stomach pain, or nausea. About 1 out of 6 people who got Shingrix experienced side effects  that prevented them from doing regular activities. Symptoms went away on their own in about 2 to 3 days. Side effects were more common in younger people. You might have a reaction to the first or second dose of Shingrix, or both doses. If you experience side effects, you may choose to take over-the-counter pain medicine such as ibuprofen or acetaminophen. If you experience side effects from Shingrix, you should report them to the Vaccine Adverse Event Reporting System (VAERS). Your doctor might file this report, or you can do it yourself through the VAERS websiteExternal, or by calling [phone]. If you have any questions about side effects from Shingrix, talk with your doctor. The shingles  vaccine does not contain thimerosal (a preservative containing mercury). Top of Page  When Should I See a Doctor Because of the Side Effects I Experience From Shingrix? In clinical trials, Shingrix was not associated with serious adverse events. In fact, serious side effects from vaccines are extremely rare. For example, for every 1 million doses of a vaccine given, only one or two people may have a severe allergic reaction. Signs of an allergic reaction happen within minutes or hours after vaccination and include hives, swelling of the face and throat, difficulty breathing, a fast heartbeat, dizziness, or weakness. If you experience these or any other life-threatening symptoms, see a doctor right away. Shingrix causes a strong response in your immune system, so it may produce short-term side effects more intense than you are used to from other vaccines. These side effects can be uncomfortable, but they are expected and usually go away on their own in 2 or 3 days. Top of Page  How Can I Pay For Shingrix? There are several ways shingles vaccine may be paid for: Medicare . Medicare Part D plans cover the shingles vaccine, but there may be a cost to you depending on your plan. There may be a copay for the vaccine, or you may need to pay in full then get reimbursed for a certain amount.  . Medicare Part B does not cover the shingles vaccine. Medicaid . Medicaid may or may not cover the vaccine. Contact your insurer to find out. Private health insurance . Many private health insurance plans will cover the vaccine, but there may be a cost to you depending on your plan. Contact your insurer to find out. Vaccine assistance programs . Some pharmaceutical companies provide vaccines to eligible adults who cannot afford them. You may want to check with the vaccine manufacturer, [employer], about Shingrix. If you do not currently have health insurance, learn more about affordable health coverage  optionsExternal. To find doctor's offices or pharmacies near you that offer the vaccine, visit [place] (COVID-19) Are you at risk?  Are you at risk for the Coronavirus (COVID-19)?  To be considered HIGH RISK for Coronavirus (COVID-19), you have to meet the following criteria:  . Traveled to [place], [place], [place], [place] or [place]; or in the [place] to [place], [place], [place], or [place]; and have fever, cough, and shortness of breath within the last 2 weeks of travel OR . Been in close contact with a person diagnosed with COVID-19 within the last 2 weeks and have fever, cough, and shortness of breath . IF YOU DO NOT MEET THESE CRITERIA, YOU ARE CONSIDERED LOW RISK FOR COVID-19.  What to do if you are HIGH RISK for COVID-19?  Marland Kitchen If you are having a medical emergency, call 911. . Seek medical  care right away. Before you go to a doctor's office, urgent care or emergency department, call ahead and tell them about your recent travel, contact with someone diagnosed with COVID-19, and your symptoms. You should receive instructions from your physician's office regarding next steps of care.  . When you arrive at healthcare provider, tell the healthcare staff immediately you have returned from visiting [place], [place], [place], [place] or [place]; or traveled in the [place] to [place], [place], [place], or [place]; in the last two weeks or you have been in close contact with a person diagnosed with COVID-19 in the last 2 weeks.   . Tell the health care staff about your symptoms: fever, cough and shortness of breath. . After you have been seen by a medical provider, you will be either: o Tested for (COVID-19) and discharged home on quarantine except to seek medical care if symptoms worsen, and asked to  - Stay home and avoid contact with others until you get your results (4-5 days)  - Avoid travel on public transportation if possible  (such as bus, train, or airplane) or o Sent to the Emergency Department by EMS for evaluation, COVID-19 testing, and possible admission depending on your condition and test results.  What to do if you are LOW RISK for COVID-19?  Reduce your risk of any infection by using the same precautions used for avoiding the common cold or flu:  Marland Kitchen Wash your hands often with soap and warm water for at least 20 seconds.  If soap and water are not readily available, use an alcohol-based hand sanitizer with at least 60% alcohol.  . If coughing or sneezing, cover your mouth and nose by coughing or sneezing into the elbow areas of your shirt or coat, into a tissue or into your sleeve (not your hands). . Avoid shaking hands with others and consider head nods or verbal greetings only. . Avoid touching your eyes, nose, or mouth with unwashed hands.  . Avoid close contact with people who are sick. . Avoid places or events with large numbers of people in one location, like concerts or sporting events. . Carefully consider travel plans you have or are making. . If you are planning any travel outside or inside the [place], visit the CDC's Travelers' Health webpage for the latest health notices. . If you have some symptoms but not all symptoms, continue to monitor at home and seek medical attention if your symptoms worsen. . If you are having a medical emergency, call 911.   [place] / e-Visit: [email or web address]         [facility]: [facility]: [phone]                   [facility]: [phone]

## 2019-04-14 ENCOUNTER — Encounter: Payer: Self-pay | Admitting: Internal Medicine

## 2019-05-13 ENCOUNTER — Ambulatory Visit: Payer: Medicare Other | Admitting: Psychiatry

## 2019-05-13 ENCOUNTER — Other Ambulatory Visit: Payer: Self-pay

## 2019-05-13 ENCOUNTER — Encounter: Payer: Self-pay | Admitting: Psychiatry

## 2019-05-13 DIAGNOSIS — F3342 Major depressive disorder, recurrent, in full remission: Secondary | ICD-10-CM | POA: Diagnosis not present

## 2019-05-13 DIAGNOSIS — F5105 Insomnia due to other mental disorder: Secondary | ICD-10-CM | POA: Diagnosis not present

## 2019-05-13 DIAGNOSIS — F422 Mixed obsessional thoughts and acts: Secondary | ICD-10-CM | POA: Diagnosis not present

## 2019-05-13 MED ORDER — CLONAZEPAM 2 MG PO TABS: 1.0000 mg | ORAL_TABLET | Freq: Every day | ORAL | 1 refills | Status: DC

## 2019-05-13 NOTE — Progress Notes (Signed)
Donna Dillon [id number] 10/31/1946 73 y.o.  Subjective:   Patient ID:  Donna Dillon is a 73 y.o. (DOB 25-Feb-1946) female.  Chief Complaint:  Chief Complaint  Patient presents with  . Follow-up    Medication Management  . Other    OCD    HPI Donna Dillon presents to the office today for follow-up of OCD.    Last seen December 2019.  No meds were changed.  Still going to work.  Was home 2 mos. She handled it pretty well.  Unexpectedly calmer.  OCD is still there but handles it well.  Anxiety manageable.  She feels works helps her mental health.  Work hard.  Exercises daily.   Patient reports stable mood and denies depressed or irritable moods.  .  Patient denies difficulty with sleep initiation or maintenance. Plenty of sleep.. Denies appetite disturbance.  Patient reports that energy and motivation have been good.  Patient denies any difficulty with concentration.  Patient denies any suicidal ideation.  Long hx OCD which was severe and failed multiple SSRI but responded best to clomipramine and has maintained benefit from the med.  OCD generally controlled except when very tired.  Can sometimes reason her way out of the OCD, obsessions which she could not do before the meds.  No SE from meds.  Cog is intact.  Still working PT for mental health reasons.    Thinks the benefit of clonazepam is worth the cognitive risks.    Son [name] Cancer doing ok in computers [employer] promotion and pay raise.  Still has compulsions including night eating.  Hasn't travelled lately but wants to.  Enjo[place]. [place].  Has a dog.  Past Psychiatric Medication Trials:  Clomipramine,  Poor response SSRIs  Review of Systems:  Review of Systems  Respiratory: Negative for shortness of breath.   Neurological: Negative for tremors and weakness.  Psychiatric/Behavioral: Negative for agitation, behavioral problems, confusion, decreased concentration, dysphoric mood, hallucinations, self-injury,  sleep disturbance and suicidal ideas. The patient is nervous/anxious. The patient is not hyperactive.     Medications: I have reviewed the patient's current medications.  Current Outpatient Medications  Medication Sig Dispense Refill  . atenolol (TENORMIN) 25 MG tablet Take half tablet, 12.5mg  daily for blood pressure. 90 tablet 1  . Calcium 1500 MG tablet Take 1,500 mg by mouth daily.     . clomiPRAMINE (ANAFRANIL) 50 MG capsule Take 1 capsule (50 mg total) by mouth at bedtime. 90 capsule 3  . clonazePAM (KLONOPIN) 2 MG tablet Take 0.5-1 tablets (1-2 mg total) by mouth at bedtime. 90 tablet 1  . ezetimibe (ZETIA) 10 MG tablet Take 1 tablet daily for Cholesterol 90 tablet 1  . fish oil-omega-3 fatty acids 1000 MG capsule Take 1 g by mouth daily.    . magnesium 30 MG tablet Take 30 mg by mouth daily.     . Multiple Vitamin (MULTIVITAMIN) tablet Take 1 tablet by mouth daily.    . Multiple Vitamins-Minerals (EYE VITAMINS PO) Take 1 capsule by mouth daily. [employer] Vitamins    . omeprazole (PRILOSEC) 40 MG capsule Take 1 capsule (40 mg total) by mouth daily. 30 capsule 11  . triamcinolone cream (KENALOG) 0.1 % Apply topically 4 (four) times daily. 85.2 g 4  . vitamin C (ASCORBIC ACID) 500 MG tablet Take 500 mg by mouth daily.    Marland Kitchen VITAMIN D PO Take 5,000 Units by mouth. Takes 1 to 2 capsules daily.     Current  Facility-Administered Medications  Medication Dose Route Frequency Provider Last Rate Last Dose  . 0.9 %  sodium chloride infusion  500 mL Intravenous Once Irene Shipper, MD        Medication Side Effects: None  Allergies:  Allergies  Allergen Reactions  . Prednisone     Altered mental state  . Penicillins   . Asa [Aspirin] Diarrhea  . Levaquin [Levofloxacin] Other (See Comments)    Joint Pain  . Macrolides And Ketolides Nausea And Vomiting  . Paxil [Paroxetine Hcl] Nausea And Vomiting  . Tetracyclines & Related Nausea And Vomiting  . Zoloft [Sertraline Hcl] Nausea And  Vomiting  . Sulfa Antibiotics Rash  . Trimethoprim Rash    Past Medical History:  Diagnosis Date  . Anemia    mild  . Anxiety   . Colon polyp    hyperplastic  . Diverticulosis   . Esophageal stricture   . GERD (gastroesophageal reflux disease)    no s/s now- off medicines  . Hyperlipidemia   . Palpitations   . Prediabetes   . Tachycardia     Family History  Problem Relation Age of Onset  . Kidney cancer Father   . Hypertension Mother   . Heart disease Mother   . Colon cancer Paternal Aunt   . Colon polyps Neg Hx   . Esophageal cancer Neg Hx   . Rectal cancer Neg Hx   . Stomach cancer Neg Hx     Social History   Socioeconomic History  . Marital status: Married    Spouse name: Not on file  . Number of children: 1  . Years of education: Not on file  . Highest education level: Not on file  Occupational History  . Occupation: Associate Professor  . Financial resource strain: Not on file  . Food insecurity:    Worry: Not on file    Inability: Not on file  . Transportation needs:    Medical: Not on file    Non-medical: Not on file  Tobacco Use  . Smoking status: Never Smoker  . Smokeless tobacco: Never Used  Substance and Sexual Activity  . Alcohol use: No    Alcohol/week: 0.0 standard drinks  . Drug use: No  . Sexual activity: Not on file  Lifestyle  . Physical activity:    Days per week: Not on file    Minutes per session: Not on file  . Stress: Not on file  Relationships  . Social connections:    Talks on phone: Not on file    Gets together: Not on file    Attends religious service: Not on file    Active member of club or organization: Not on file    Attends meetings of clubs or organizations: Not on file    Relationship status: Not on file  . Intimate partner violence:    Fear of current or ex partner: Not on file    Emotionally abused: Not on file    Physically abused: Not on file    Forced sexual activity: Not on file  Other Topics  Concern  . Not on file  Social History Narrative  . Not on file    Past Medical History, Surgical history, Social history, and Family history were reviewed and updated as appropriate.   Son has severe anxiety and is treated also.  Please see review of systems for further details on the patient's review from today.   Objective:   Physical Exam:  LMP  12/09/1994   Physical Exam Constitutional:      General: She is not in acute distress.    Appearance: She is well-developed.  Musculoskeletal:        General: No deformity.  Neurological:     Mental Status: She is alert and oriented to person, place, and time.     Motor: No tremor.     Coordination: Coordination normal.     Gait: Gait normal.  Psychiatric:        Attention and Perception: She is attentive.        Mood and Affect: Mood is anxious. Mood is not depressed. Affect is not labile, blunt, angry or inappropriate.        Speech: Speech normal.        Behavior: Behavior normal.        Thought Content: Thought content normal. Thought content does not include homicidal or suicidal ideation. Thought content does not include homicidal or suicidal plan.        Judgment: Judgment normal.     Comments: Insight intact. Residual obsesssions manageable. No auditory or visual hallucinations. No delusions.      Lab Review:     Component Value Date/Time   NA 139 12/24/2018 1633   K 3.8 12/24/2018 1633   CL 103 12/24/2018 1633   CO2 29 12/24/2018 1633   GLUCOSE 91 12/24/2018 1633   BUN 10 12/24/2018 1633   CREATININE 0.74 12/24/2018 1633   CALCIUM 9.4 12/24/2018 1633   PROT 6.7 12/24/2018 1633   ALBUMIN 4.0 05/13/2017 1510   AST 10 12/24/2018 1633   ALT 17 12/24/2018 1633   ALKPHOS 61 05/13/2017 1510   BILITOT 0.5 12/24/2018 1633   GFRNONAA 82 12/24/2018 1633   GFRAA 94 12/24/2018 1633       Component Value Date/Time   WBC 6.8 12/24/2018 1633   RBC 4.63 12/24/2018 1633   HGB 13.7 12/24/2018 1633   HCT 40.5  12/24/2018 1633   PLT 252 12/24/2018 1633   MCV 87.5 12/24/2018 1633   MCH 29.6 12/24/2018 1633   MCHC 33.8 12/24/2018 1633   RDW 12.7 12/24/2018 1633   LYMPHSABS 2,224 12/24/2018 1633   MONOABS 0.8 12/18/2018 1628   EOSABS 218 12/24/2018 1633   BASOSABS 48 12/24/2018 1633    No results found for: POCLITH, LITHIUM   No results found for: PHENYTOIN, PHENOBARB, VALPROATE, CBMZ   .res Assessment: Plan:    Mixed obsessional thoughts and acts  Major depression, recurrent, full remission ([facility])  Insomnia due to mental condition   We discussed the short-term risks associated with benzodiazepines including sedation and increased fall risk among others.  Discussed long-term side effect risk including dependence, potential withdrawal symptoms, and the potential eventual dose-related risk of dementia. She accepts the risk bc feels she must have the clonazepam and clomipramine.  Disc cardiac risks of the clomipramine.  Her cognition is very good.  Insomnia managed.  Supportive therapy on relapse prevention.  She feels it is manageable.  Overall OCD is managed fairly well with low dose clomipramine.  Consider repeat blood level if she ever gets SE.  PT work also helps bc it's a distraction.  Disc CBT techniques to fight the obsessions that remain and occasionally recur.  This appt was 30 mins.  FU 6 mos  Lynder Parents, MD, DFAPA   Please see After Visit Summary for patient specific instructions.  Future Appointments  Date Time Provider [place]  07/12/2019  3:00 PM Unk Pinto, MD  [facility] None  05/02/2020 11:15 AM McClanahan, Danton Sewer, NP [facility] None    No orders of the defined types were placed in this encounter.     -------------------------------

## 2019-05-25 ENCOUNTER — Other Ambulatory Visit: Payer: Self-pay | Admitting: Internal Medicine

## 2019-05-25 DIAGNOSIS — R000 Tachycardia, unspecified: Secondary | ICD-10-CM

## 2019-05-25 DIAGNOSIS — I10 Essential (primary) hypertension: Secondary | ICD-10-CM

## 2019-07-09 LAB — HM MAMMOGRAPHY

## 2019-07-12 ENCOUNTER — Other Ambulatory Visit: Payer: Self-pay

## 2019-07-12 ENCOUNTER — Encounter: Payer: Self-pay | Admitting: Internal Medicine

## 2019-07-12 ENCOUNTER — Ambulatory Visit: Payer: Medicare Other | Admitting: Internal Medicine

## 2019-07-12 VITALS — BP 124/75 | HR 64 | Temp 97.1°F | Resp 16 | Ht 67.0 in | Wt 153.2 lb

## 2019-07-12 DIAGNOSIS — Z0001 Encounter for general adult medical examination with abnormal findings: Secondary | ICD-10-CM

## 2019-07-12 DIAGNOSIS — E611 Iron deficiency: Secondary | ICD-10-CM

## 2019-07-12 DIAGNOSIS — R7309 Other abnormal glucose: Secondary | ICD-10-CM

## 2019-07-12 DIAGNOSIS — Z1211 Encounter for screening for malignant neoplasm of colon: Secondary | ICD-10-CM

## 2019-07-12 DIAGNOSIS — R7303 Prediabetes: Secondary | ICD-10-CM

## 2019-07-12 DIAGNOSIS — Z13820 Encounter for screening for osteoporosis: Secondary | ICD-10-CM

## 2019-07-12 DIAGNOSIS — Z1212 Encounter for screening for malignant neoplasm of rectum: Secondary | ICD-10-CM

## 2019-07-12 DIAGNOSIS — Z8249 Family history of ischemic heart disease and other diseases of the circulatory system: Secondary | ICD-10-CM | POA: Diagnosis not present

## 2019-07-12 DIAGNOSIS — I10 Essential (primary) hypertension: Secondary | ICD-10-CM

## 2019-07-12 DIAGNOSIS — E782 Mixed hyperlipidemia: Secondary | ICD-10-CM

## 2019-07-12 DIAGNOSIS — E559 Vitamin D deficiency, unspecified: Secondary | ICD-10-CM

## 2019-07-12 DIAGNOSIS — Z136 Encounter for screening for cardiovascular disorders: Secondary | ICD-10-CM | POA: Diagnosis not present

## 2019-07-12 DIAGNOSIS — Z79899 Other long term (current) drug therapy: Secondary | ICD-10-CM

## 2019-07-12 DIAGNOSIS — K2100 Gastro-esophageal reflux disease with esophagitis, without bleeding: Secondary | ICD-10-CM

## 2019-07-12 DIAGNOSIS — L659 Nonscarring hair loss, unspecified: Secondary | ICD-10-CM

## 2019-07-12 NOTE — Progress Notes (Signed)
Annual Screening/Preventative Visit & Comprehensive Evaluation &  Examination     This very nice 73 y.o. MWF presents for a Screening /Preventative Visit & comprehensive evaluation and management of multiple medical co-morbidities.  Patient has been followed for HTN, HLD, Prediabetes  and Vitamin D Deficiency. Patient has GERD controlled on her meds.     Patient has been followed since the 1990's for labile HTN & has been controlled on low dose Atenolol. Patient's BP has been controlled at home and patient denies any cardiac symptoms as chest pain, palpitations, shortness of breath, dizziness or ankle swelling. Today's BP is at goal - 124/75.      Patient's hyperlipidemia is not controlled with diet and medications. Patient denies myalgias or other medication SE's. Last lipids were not at goal: Lab Results  Component Value Date   CHOL 205 (H) 12/24/2018   HDL 64 12/24/2018   LDLCALC 111 (H) 12/24/2018   TRIG 186 (H) 12/24/2018   CHOLHDL 3.2 12/24/2018      Patient has hx/o prediabetes  (A1c 5.8% / 2011) and patient denies reactive hypoglycemic symptoms, visual blurring, diabetic polys or paresthesias. Last A1c was Normal & at goal: Lab Results  Component Value Date   HGBA1C 5.5 12/24/2018      Finally, patient has history of Vitamin D Deficiency ("36" / 2008)  and last Vitamin D was not at goal (70-100): Lab Results  Component Value Date   VD25OH 50 12/24/2018   Current Outpatient Medications on File Prior to Visit  Medication Sig  . atenolol (TENORMIN) 25 MG tablet Take 1 tablet Daily for BP  . Calcium 1500 MG tablet Take 1,500 mg by mouth daily.   . clomiPRAMINE (ANAFRANIL) 50 MG capsule Take 1 capsule (50 mg total) by mouth at bedtime.  . clonazePAM (KLONOPIN) 2 MG tablet Take 0.5-1 tablets (1-2 mg total) by mouth at bedtime.  Marland Kitchen ezetimibe (ZETIA) 10 MG tablet Take 1 tablet daily for Cholesterol  . fish oil-omega-3 fatty acids 1000 MG capsule Take 1 g by mouth daily.  .  magnesium 30 MG tablet Take 30 mg by mouth daily.   . Multiple Vitamin (MULTIVITAMIN) tablet Take 1 tablet by mouth daily.  . Multiple Vitamins-Minerals (EYE VITAMINS PO) Take 1 capsule by mouth daily. Ocusight Vitamins  . omeprazole (PRILOSEC) 40 MG capsule Take 1 capsule (40 mg total) by mouth daily.  Marland Kitchen triamcinolone cream (KENALOG) 0.1 % Apply topically 4 (four) times daily.  . vitamin C (ASCORBIC ACID) 500 MG tablet Take 500 mg by mouth daily.  Marland Kitchen VITAMIN D PO Take 5,000 Units by mouth. Takes 1 to 2 capsules daily.   No current facility-administered medications on file prior to visit.    Allergies  Allergen Reactions  . Prednisone     Altered mental state  . Penicillins   . Asa [Aspirin] Diarrhea  . Levaquin [Levofloxacin] Other (See Comments)    Joint Pain  . Macrolides And Ketolides Nausea And Vomiting  . Paxil [Paroxetine Hcl] Nausea And Vomiting  . Tetracyclines & Related Nausea And Vomiting  . Zoloft [Sertraline Hcl] Nausea And Vomiting  . Sulfa Antibiotics Rash  . Trimethoprim Rash   Past Medical History:  Diagnosis Date  . Anemia    mild  . Anxiety   . Colon polyp    hyperplastic  . Diverticulosis   . Esophageal stricture   . GERD (gastroesophageal reflux disease)    no s/s now- off medicines  . Hyperlipidemia   . Palpitations   .  Prediabetes   . Tachycardia    Health Maintenance  Topic Date Due  . Hepatitis C Screening  06-Jun-1946  . MAMMOGRAM  06/16/2019  . INFLUENZA VACCINE  07/10/2019  . COLONOSCOPY  03/05/2021  . TETANUS/TDAP  12/04/2027  . DEXA SCAN  Completed  . PNA vac Low Risk Adult  Completed   Immunization History  Administered Date(s) Administered  . Influenza, High Dose Seasonal PF 11/14/2014, 10/10/2016, 12/03/2017, 11/03/2018  . Pneumococcal Conjugate-13 09/21/2015  . Pneumococcal Polysaccharide-23 02/14/2009, 05/13/2017  . Td 12/11/2006, 12/03/2017  . Tdap 12/09/2005  . Zoster 02/27/2011   Last Colon - 03/05/2016 - Dr Henrene Pastor - Recc  5 yr f/u - due Apr 2022  Last EGD -   02/04/2019 - Dr Henrene Pastor - Esophageal stricture - dilated  Last MGM - 06/15/2018  Past Surgical History:  Procedure Laterality Date  . BREAST BIOPSY Left   . CESAREAN SECTION     x1  . COLONOSCOPY    . eye lift    . POLYPECTOMY    . TONSILLECTOMY    . UPPER GASTROINTESTINAL ENDOSCOPY     Family History  Problem Relation Age of Onset  . Kidney cancer Father   . Hypertension Mother   . Heart disease Mother   . Colon cancer Paternal Aunt   . Colon polyps Neg Hx   . Esophageal cancer Neg Hx   . Rectal cancer Neg Hx   . Stomach cancer Neg Hx    Social History   Tobacco Use  . Smoking status: Never Smoker  . Smokeless tobacco: Never Used  Substance Use Topics  . Alcohol use: No    Alcohol/week: 0.0 standard drinks  . Drug use: No    ROS Constitutional: Denies fever, chills, weight loss/gain, headaches, insomnia,  night sweats, and change in appetite. Does c/o fatigue. Eyes: Denies redness, blurred vision, diplopia, discharge, itchy, watery eyes.  ENT: Denies discharge, congestion, post nasal drip, epistaxis, sore throat, earache, hearing loss, dental pain, Tinnitus, Vertigo, Sinus pain, snoring.  Cardio: Denies chest pain, palpitations, irregular heartbeat, syncope, dyspnea, diaphoresis, orthopnea, PND, claudication, edema Respiratory: denies cough, dyspnea, DOE, pleurisy, hoarseness, laryngitis, wheezing.  Gastrointestinal: Denies dysphagia, heartburn, reflux, water brash, pain, cramps, nausea, vomiting, bloating, diarrhea, constipation, hematemesis, melena, hematochezia, jaundice, hemorrhoids Genitourinary: Denies dysuria, frequency, urgency, nocturia, hesitancy, discharge, hematuria, flank pain Breast: Breast lumps, nipple discharge, bleeding.  Musculoskeletal: Denies arthralgia, myalgia, stiffness, Jt. Swelling, pain, limp, and strain/sprain. Denies falls. Skin: Denies puritis, rash, hives, warts, acne, eczema, changing in skin lesion  Neuro: No weakness, tremor, incoordination, spasms, paresthesia, pain Psychiatric: Denies confusion, memory loss, sensory loss. Denies Depression. Endocrine: Denies change in weight, skin, hair change, nocturia, and paresthesia, diabetic polys, visual blurring, hyper / hypo glycemic episodes.  Heme/Lymph: No excessive bleeding, bruising, enlarged lymph nodes.  Physical Exam  BP 124/75   Pulse 64   Temp (!) 97.1 F (36.2 C)   Resp 16   Ht 5\' 7"  (1.702 m)   Wt 153 lb 3.2 oz (69.5 kg)   LMP 12/09/1994   BMI 23.99 kg/m   General Appearance: Well nourished, well groomed and in no apparent distress.  Eyes: PERRLA, EOMs, conjunctiva no swelling or erythema, normal fundi and vessels. Sinuses: No frontal/maxillary tenderness ENT/Mouth: EACs patent / TMs  nl. Nares clear without erythema, swelling, mucoid exudates. Oral hygiene is good. No erythema, swelling, or exudate. Tongue normal, non-obstructing. Tonsils not swollen or erythematous. Hearing normal.  Neck: Supple, thyroid not palpable. No bruits, nodes or  JVD. Respiratory: Respiratory effort normal.  BS equal and clear bilateral without rales, rhonci, wheezing or stridor. Cardio: Heart sounds are normal with regular rate and rhythm and no murmurs, rubs or gallops. Peripheral pulses are normal and equal bilaterally without edema. No aortic or femoral bruits. Chest: symmetric with normal excursions and percussion. Breasts: Exam deferred by patient to upcoming MGM. Abdomen: Flat, soft with bowel sounds active. Nontender, no guarding, rebound, hernias, masses, or organomegaly.  Lymphatics: Non tender without lymphadenopathy. Musculoskeletal: Full ROM all peripheral extremities, joint stability, 5/5 strength, and normal gait. Skin: Warm and dry without rashes, lesions, cyanosis, clubbing or  ecchymosis.  Neuro: Cranial nerves intact, reflexes equal bilaterally. Normal muscle tone, no cerebellar symptoms. Sensation intact.  Pysch: Alert and  oriented X 3, normal affect, Insight and Judgment appropriate.   Assessment and Plan  1. Annual Preventative Screening Examination  2. Essential hypertension  - EKG 12-Lead - Urinalysis, Routine w reflex microscopic - Microalbumin / creatinine urine ratio - CBC with Differential/Platelet - COMPLETE METABOLIC PANEL WITH GFR - Magnesium - TSH  3. Hyperlipidemia, mixed  - EKG 12-Lead - Lipid panel - TSH  4. Abnormal glucose  - EKG 12-Lead - Hemoglobin A1c - Insulin, random  5. Vitamin D deficiency  - VITAMIN D 25 Hydroxy  6. Gastroesophageal reflux disease with hx/o esophagitis  - CBC with Differential/Platelet  7. Prediabetes  - Hemoglobin A1c - Insulin, random  8. Screening for colorectal cancer  - POC Hemoccult Bld/Stl  9. Screening for ischemic heart disease  - EKG 12-Lead  10. FHx: heart disease  - EKG 12-Lead  11. Medication management  - Urinalysis, Routine w reflex microscopic - Microalbumin / creatinine urine ratio - CBC with Differential/Platelet - COMPLETE METABOLIC PANEL WITH GFR - Magnesium - Lipid panel - TSH - Hemoglobin A1c - Insulin, random - VITAMIN D 25 Hydroxyl        Patient was counseled in prudent diet to achieve/maintain BMI less than 25 for weight control, BP monitoring, regular exercise and medications. Discussed med's effects and SE's. Screening labs and tests as requested with regular follow-up as recommended. Over 40 minutes of exam, counseling, chart review and high complex critical decision making was performed.   Kirtland Bouchard, MD

## 2019-07-12 NOTE — Patient Instructions (Signed)

## 2019-07-13 LAB — COMPLETE METABOLIC PANEL WITH GFR
AG Ratio: 1.8 (calc) (ref 1.0–2.5)
ALT: 15 U/L (ref 6–29)
AST: 10 U/L (ref 10–35)
Albumin: 4.2 g/dL (ref 3.6–5.1)
Alkaline phosphatase (APISO): 64 U/L (ref 37–153)
BUN: 15 mg/dL (ref 7–25)
CO2: 28 mmol/L (ref 20–32)
Calcium: 9.4 mg/dL (ref 8.6–10.4)
Chloride: 105 mmol/L (ref 98–110)
Creat: 0.73 mg/dL (ref 0.60–0.93)
GFR, Est African American: 95 mL/min/{1.73_m2} (ref >=60)
GFR, Est Non African American: 82 mL/min/{1.73_m2} (ref >=60)
Globulin: 2.3 g/dL (calc) (ref 1.9–3.7)
Glucose, Bld: 97 mg/dL (ref 65–99)
Potassium: 4.1 mmol/L (ref 3.5–5.3)
Sodium: 140 mmol/L (ref 135–146)
Total Bilirubin: 0.5 mg/dL (ref 0.2–1.2)
Total Protein: 6.5 g/dL (ref 6.1–8.1)

## 2019-07-13 LAB — CBC WITH DIFFERENTIAL/PLATELET
Absolute Monocytes: 506 cells/uL (ref 200–950)
Basophils Absolute: 28 cells/uL (ref 0–200)
Basophils Relative: 0.5 %
Eosinophils Absolute: 160 cells/uL (ref 15–500)
Eosinophils Relative: 2.9 %
HCT: 41 % (ref 35.0–45.0)
Hemoglobin: 13.4 g/dL (ref 11.7–15.5)
Lymphs Abs: 1931 cells/uL (ref 850–3900)
MCH: 29 pg (ref 27.0–33.0)
MCHC: 32.7 g/dL (ref 32.0–36.0)
MCV: 88.7 fL (ref 80.0–100.0)
MPV: 10 fL (ref 7.5–12.5)
Monocytes Relative: 9.2 %
Neutro Abs: 2877 cells/uL (ref 1500–7800)
Neutrophils Relative %: 52.3 %
Platelets: 248 10*3/uL (ref 140–400)
RBC: 4.62 10*6/uL (ref 3.80–5.10)
RDW: 12.8 % (ref 11.0–15.0)
Total Lymphocyte: 35.1 %
WBC: 5.5 10*3/uL (ref 3.8–10.8)

## 2019-07-13 LAB — LIPID PANEL
Cholesterol: 191 mg/dL (ref <200)
HDL: 64 mg/dL (ref >=50)
LDL Cholesterol (Calc): 105 mg/dL (calc) — ABNORMAL HIGH
Non-HDL Cholesterol (Calc): 127 mg/dL (calc) (ref <130)
Total CHOL/HDL Ratio: 3 (calc) (ref <5.0)
Triglycerides: 119 mg/dL (ref <150)

## 2019-07-13 LAB — MAGNESIUM: Magnesium: 2 mg/dL (ref 1.5–2.5)

## 2019-07-13 LAB — TEST AUTHORIZATION

## 2019-07-13 LAB — IRON, TOTAL/TOTAL IRON BINDING CAP
%SAT: 33 % (calc) (ref 16–45)
Iron: 108 ug/dL (ref 45–160)
TIBC: 331 mcg/dL (calc) (ref 250–450)

## 2019-07-13 LAB — URINALYSIS, ROUTINE W REFLEX MICROSCOPIC
Bacteria, UA: NONE SEEN /HPF
Bilirubin Urine: NEGATIVE
Glucose, UA: NEGATIVE
Hgb urine dipstick: NEGATIVE
Hyaline Cast: NONE SEEN /LPF
Ketones, ur: NEGATIVE
Nitrite: NEGATIVE
Protein, ur: NEGATIVE
RBC / HPF: NONE SEEN /HPF (ref 0–2)
Specific Gravity, Urine: 1.01 (ref 1.001–1.03)
Squamous Epithelial / LPF: NONE SEEN /HPF (ref <=5)
WBC, UA: NONE SEEN /HPF (ref 0–5)
pH: 7 (ref 5.0–8.0)

## 2019-07-13 LAB — TSH: TSH: 1.37 mIU/L (ref 0.40–4.50)

## 2019-07-13 LAB — HEMOGLOBIN A1C
Hgb A1c MFr Bld: 5.5 % of total Hgb (ref <5.7)
Mean Plasma Glucose: 111 (calc)
eAG (mmol/L): 6.2 (calc)

## 2019-07-13 LAB — MICROALBUMIN / CREATININE URINE RATIO
Creatinine, Urine: 37 mg/dL (ref 20–275)
Microalb, Ur: <0.2 mg/dL

## 2019-07-13 LAB — VITAMIN D 25 HYDROXY (VIT D DEFICIENCY, FRACTURES): Vit D, 25-Hydroxy: 59 ng/mL (ref 30–100)

## 2019-07-13 LAB — INSULIN, RANDOM: Insulin: 12.7 u[IU]/mL

## 2019-07-27 ENCOUNTER — Encounter: Payer: Self-pay | Admitting: Internal Medicine

## 2019-07-29 ENCOUNTER — Encounter: Payer: Self-pay | Admitting: Internal Medicine

## 2019-08-24 ENCOUNTER — Telehealth: Payer: Self-pay | Admitting: Internal Medicine

## 2019-08-24 NOTE — Telephone Encounter (Signed)
Pt states that she is having trouble swallowing again and she wants Dr. Henrene Pastor to be aware of.

## 2019-08-24 NOTE — Telephone Encounter (Signed)
Pt states that she started noticing this morning at breakfast that when she swallowed she had pain where the esophagus goes into the stomach. States she stood up at breakfast and finally the food passed on through. She is having no issues with liquids and the symptoms just started this am. Reports she had an EGD in January and another one in February. Pt wants to know what Dr. Henrene Pastor recommends. Please advise.

## 2019-08-24 NOTE — Telephone Encounter (Signed)
1.  Please make sure she is taking a PPI daily.  If not prescribe omeprazole 40 mg daily 2.  Soft diet.  Try to avoid meats or other items that might get stuck.  Chew food extraordinarily well. 3.  Schedule EGD with balloon dilation of the esophagus in the [facility] next available that she can make

## 2019-08-25 NOTE — Telephone Encounter (Signed)
Noted.  She is at risk for food impaction.  ANY further problems with dysphasia, I would strongly urge setting up endoscopy sooner rather than later

## 2019-08-25 NOTE — Telephone Encounter (Signed)
Spoke with pt and  she states she is better and thinks she may have had gas yesterday. She is taking the omeprazole daily and knows to follow the soft diet. Pt wants to wait on scheduling the EGD for now. Dr. Henrene Pastor aware.

## 2019-09-07 ENCOUNTER — Other Ambulatory Visit: Payer: Self-pay | Admitting: Internal Medicine

## 2019-10-14 ENCOUNTER — Ambulatory Visit: Payer: Medicare Other | Admitting: Adult Health

## 2019-11-01 ENCOUNTER — Telehealth: Payer: Self-pay | Admitting: Internal Medicine

## 2019-11-01 NOTE — Telephone Encounter (Signed)
Pt aware. Pt scheduled for previsit 12/1@11am , covid screen 12/1@11 :50am, EGD with dil scheduled in the [facility] 11/12/19@8am . Pt aware of appts.

## 2019-11-01 NOTE — Telephone Encounter (Signed)
Pt states that she had an episode of choking with food this past weekend, she stated that it took about 45 minutes to get food out of her esophagus. She wants to know what Dr. Henrene Pastor thinks the next step to do is. Pls call her.

## 2019-11-01 NOTE — Telephone Encounter (Signed)
Pt states she got choked on a vitamin that got stuck at the bottom of her esophagus, reports it took about 40 minutes for this to pass. Patient thought she was going to have to proceed to the ER but it went down. She wanted to let Dr. Henrene Pastor know to see if he feels anything further needs to done. Please advise.

## 2019-11-01 NOTE — Telephone Encounter (Signed)
1.  Soft diet 2.  Schedule EGD with balloon dilation of the esophagus and LEC

## 2019-11-09 ENCOUNTER — Ambulatory Visit: Payer: Medicare Other

## 2019-11-09 ENCOUNTER — Encounter: Payer: Self-pay | Admitting: Internal Medicine

## 2019-11-09 ENCOUNTER — Other Ambulatory Visit: Payer: Self-pay

## 2019-11-09 ENCOUNTER — Ambulatory Visit (AMBULATORY_SURGERY_CENTER): Payer: Medicare Other | Admitting: *Deleted

## 2019-11-09 VITALS — Temp 97.2°F | Ht 67.0 in | Wt 154.0 lb

## 2019-11-09 DIAGNOSIS — Z1159 Encounter for screening for other viral diseases: Secondary | ICD-10-CM

## 2019-11-09 DIAGNOSIS — R1310 Dysphagia, unspecified: Secondary | ICD-10-CM

## 2019-11-09 NOTE — Progress Notes (Signed)
Patient is here in-person for PV. Patient denies any allergies to eggs or soy. Patient denies any problems with anesthesia/sedation. Patient denies any oxygen use at home. Patient denies taking any diet/weight loss medications or blood thinners. Patient is not being treated for MRSA or C-diff.    COVID-19 screening test is on today 11/09/2019, the pt is aware. Pt is aware that care partner will wait in the car during procedure; if they feel like they will be too hot or cold to wait in the car; they may wait in the 4 th floor lobby. Patient is aware to bring only one care partner. We want them to wear a mask (we do not have any that we can provide them), practice social distancing, and we will check their temperatures when they get here.  I did remind the patient that their care partner needs to stay in the parking lot the entire time and have a cell phone available, we will call them when the pt is ready for discharge. Patient will wear mask into building.

## 2019-11-10 LAB — SARS CORONAVIRUS 2 (TAT 6-24 HRS): SARS Coronavirus 2: NEGATIVE

## 2019-11-12 ENCOUNTER — Encounter: Payer: Self-pay | Admitting: Internal Medicine

## 2019-11-12 ENCOUNTER — Ambulatory Visit (AMBULATORY_SURGERY_CENTER): Payer: Medicare Other | Admitting: Internal Medicine

## 2019-11-12 ENCOUNTER — Other Ambulatory Visit: Payer: Self-pay

## 2019-11-12 VITALS — BP 94/56 | HR 58 | Temp 97.8°F | Resp 12 | Ht 67.0 in | Wt 154.0 lb

## 2019-11-12 DIAGNOSIS — R1310 Dysphagia, unspecified: Secondary | ICD-10-CM

## 2019-11-12 DIAGNOSIS — K219 Gastro-esophageal reflux disease without esophagitis: Secondary | ICD-10-CM

## 2019-11-12 DIAGNOSIS — K222 Esophageal obstruction: Secondary | ICD-10-CM | POA: Diagnosis not present

## 2019-11-12 MED ORDER — SODIUM CHLORIDE 0.9 % IV SOLN: 500.0000 mL | INTRAVENOUS | Status: DC

## 2019-11-12 NOTE — Progress Notes (Addendum)
Called to room to assist during endoscopic procedure.  Patient ID and intended procedure confirmed with present staff. Received instructions for my participation in the procedure from the performing physician. After balloon went up to 18, 19, & 20, the balloon was deflated per Dr. Blanch Media prder Per Dr. Henrene Pastor will take bites of tissue at the esophageal ring with bx forcep only to disrupt the esophageal ring, no specimens for pathology.  The balloon was then inserted back into the scope, and inflated to 18, 19, 20 and then deflated as Dr. Henrene Pastor instructed. maw

## 2019-11-12 NOTE — Op Note (Signed)
[facility] Patient Name: Donna Dillon Procedure Date: 11/12/2019 8:14 AM MRN: [id number] Endoscopist: Docia Chuck. Henrene Pastor , MD Age: 73 Referring MD:  Date of Birth: [birth year] Gender: Female Account #: [id number] Procedure:                Upper GI endoscopy with biopsy; with balloon                            dilation esophagus?"20 mm max Indications:              Therapeutic procedure, Dysphagia. Previous dilation                            procedures in January and February 2020. Now with                            recurrent dysphagia Medicines:                Monitored Anesthesia Care Procedure:                Pre-Anesthesia Assessment:                           - Prior to the procedure, a History and Physical                            was performed, and patient medications and                            allergies were reviewed. The patient's tolerance of                            previous anesthesia was also reviewed. The risks                            and benefits of the procedure and the sedation                            options and risks were discussed with the patient.                            All questions were answered, and informed consent                            was obtained. Prior Anticoagulants: The patient has                            taken no previous anticoagulant or antiplatelet                            agents. ASA Grade Assessment: II - A patient with                            mild systemic disease. After reviewing the risks  and benefits, the patient was deemed in                            satisfactory condition to undergo the procedure.                           After obtaining informed consent, the endoscope was                            passed under direct vision. Throughout the                            procedure, the patient's blood pressure, pulse, and                            oxygen saturations were  monitored continuously. The                            Endoscope was introduced through the mouth, and                            advanced to the second part of duodenum. The upper                            GI endoscopy was accomplished without difficulty.                            The patient tolerated the procedure well. Scope In: Scope Out: Findings:                 One benign-appearing, intrinsic moderate stenosis                            was found 39 cm from the incisors. This stenosis                            measured 1.5 cm (inner diameter). The stenosis was                            traversed. A TTS dilator was passed through the                            scope. Dilation with an 18-19-20 mm balloon dilator                            was performed to 20 mm. There was minimal                            disruption of the dense fibrous stricture. Thus a                            biopsy forcep was used to biopsy disrupt the dense  fibrous stricture into regions. Balloon dilation                            was subsequently performed to 20 mm with improved                            appearance of the stricture with or obvious                            disruption.                           The stomach revealed a moderate sized hiatal hernia                            with Cameron erosions.                           The examined duodenum was normal.                           The cardia and gastric fundus were normal on                            retroflexion except for the aforementioned hiatal                            hernia. Complications:            No immediate complications. Estimated Blood Loss:     Estimated blood loss: none. Impression:               1. Dense fibrous stricture of the distal esophagus                            status post biopsy disruption and balloon dilation                           2. Moderate-sized hiatal hernia with  [name]                            erosions                           3. Otherwise normal EGD. Recommendation:           1. Post dilation diet                           2. Continue current medications                           3. Routine office follow-up with Dr. Henrene Pastor in 3                            months  4. Would consider triamcinolone injection therapy                            along with stricture disruption and dilation should                            dysphagia recur                           5. Contact the office in the interim if you have                            any difficulties with swallowing. Docia Chuck. Henrene Pastor, MD 11/12/2019 9:08:01 AM This report has been signed electronically.

## 2019-11-12 NOTE — Patient Instructions (Signed)
Thank you for allowing [place] to care for you today!  Await pathology results by mail, approximately 1-2 weeks.  Continue current medications.  Resume regular diet TOMORROW.  Today follow POST DILATION diet, handout provided.  Routine office follow-up with Dr Henrene Pastor in 3 months.  Don't hesitate to contact Dr Henrene Pastor in the interim if you develop any swallowing difficulties.    YOU HAD AN ENDOSCOPIC PROCEDURE TODAY AT [facility]:   Refer to the procedure report that was given to you for any specific questions about what was found during the examination.  If the procedure report does not answer your questions, please call your gastroenterologist to clarify.  If you requested that your care partner not be given the details of your procedure findings, then the procedure report has been included in a sealed envelope for you to review at your convenience later.  YOU SHOULD EXPECT: Some feelings of bloating in the abdomen. Passage of more gas than usual.  Walking can help get rid of the air that was put into your GI tract during the procedure and reduce the bloating. If you had a lower endoscopy (such as a colonoscopy or flexible sigmoidoscopy) you may notice spotting of blood in your stool or on the toilet paper. If you underwent a bowel prep for your procedure, you may not have a normal bowel movement for a few days.  Please Note:  You might notice some irritation and congestion in your nose or some drainage.  This is from the oxygen used during your procedure.  There is no need for concern and it should clear up in a day or so.  SYMPTOMS TO REPORT IMMEDIATELY:     Following upper endoscopy (EGD)  Vomiting of blood or coffee ground material  New chest pain or pain under the shoulder blades  Painful or persistently difficult swallowing  New shortness of breath  Fever of 100F or higher  Black, tarry-looking stools  For urgent or emergent issues, a gastroenterologist can be  reached at any hour by calling [phone].   DIET:  We do recommend a small meal at first, but then you may proceed to your regular diet.  Drink plenty of fluids but you should avoid alcoholic beverages for 24 hours.  ACTIVITY:  You should plan to take it easy for the rest of today and you should NOT DRIVE or use heavy machinery until tomorrow (because of the sedation medicines used during the test).    FOLLOW UP: Our staff will call the number listed on your records 48-72 hours following your procedure to check on you and address any questions or concerns that you may have regarding the information given to you following your procedure. If we do not reach you, we will leave a message.  We will attempt to reach you two times.  During this call, we will ask if you have developed any symptoms of COVID 19. If you develop any symptoms (ie: fever, flu-like symptoms, shortness of breath, cough etc.) before then, please call [phone].  If you test positive for Covid 19 in the 2 weeks post procedure, please call and report this information to [place].    If any biopsies were taken you will be contacted by phone or by letter within the next 1-3 weeks.  Please call us at [phone] if you have not heard about the biopsies in 3 weeks.    SIGNATURES/CONFIDENTIALITY: You and/or your care partner have signed paperwork which will be entered  into your electronic medical record.  These signatures attest to the fact that that the information above on your After Visit Summary has been reviewed and is understood.  Full responsibility of the confidentiality of this discharge information lies with you and/or your care-partner.

## 2019-11-12 NOTE — Progress Notes (Signed)
Report to PACU, RN, vss, BBS= Clear.  

## 2019-11-16 ENCOUNTER — Telehealth: Payer: Self-pay | Admitting: *Deleted

## 2019-11-16 NOTE — Telephone Encounter (Signed)
Follow up call made. Left message

## 2019-11-16 NOTE — Telephone Encounter (Signed)
  Follow up Call-  Call back number 11/12/2019 02/04/2019 12/23/2018  Post procedure Call Back phone  # [phone]  Permission to leave phone message Yes Yes Yes  Some recent data might be hidden     Patient questions:  Do you have a fever, pain , or abdominal swelling? No. Pain Score  0 *  Have you tolerated food without any problems? Yes.    Have you been able to return to your normal activities? Yes.    Do you have any questions about your discharge instructions: Diet   No. Medications  No. Follow up visit  No.  Do you have questions or concerns about your Care? No.  Actions: * If pain score is 4 or above: No action needed, pain <4.  Spoke with husband.   1. Have you developed a fever since your procedure? no  2.   Have you had an respiratory symptoms (SOB or cough) since your procedure? no  3.   Have you tested positive for COVID 19 since your procedure no  4.   Have you had any family members/close contacts diagnosed with the COVID 19 since your procedure?  no   If yes to any of these questions please route to Joylene John, RN and Alphonsa Gin, Therapist, sports.

## 2019-11-18 ENCOUNTER — Ambulatory Visit (INDEPENDENT_AMBULATORY_CARE_PROVIDER_SITE_OTHER): Payer: Medicare Other | Admitting: Psychiatry

## 2019-11-18 ENCOUNTER — Encounter: Payer: Self-pay | Admitting: Psychiatry

## 2019-11-18 ENCOUNTER — Other Ambulatory Visit: Payer: Self-pay

## 2019-11-18 DIAGNOSIS — F422 Mixed obsessional thoughts and acts: Secondary | ICD-10-CM | POA: Diagnosis not present

## 2019-11-18 DIAGNOSIS — F3342 Major depressive disorder, recurrent, in full remission: Secondary | ICD-10-CM

## 2019-11-18 DIAGNOSIS — F5105 Insomnia due to other mental disorder: Secondary | ICD-10-CM | POA: Diagnosis not present

## 2019-11-18 DIAGNOSIS — F329 Major depressive disorder, single episode, unspecified: Secondary | ICD-10-CM | POA: Diagnosis not present

## 2019-11-18 DIAGNOSIS — F32A Depression, unspecified: Secondary | ICD-10-CM

## 2019-11-18 MED ORDER — CLONAZEPAM 2 MG PO TABS: 2.0000 mg | ORAL_TABLET | Freq: Every day | ORAL | 1 refills | Status: DC

## 2019-11-18 MED ORDER — CLOMIPRAMINE HCL 50 MG PO CAPS: 50.0000 mg | ORAL_CAPSULE | Freq: Every day | ORAL | 3 refills | Status: DC

## 2019-11-18 NOTE — Progress Notes (Signed)
Donna Dillon [id number] Apr 30, 1946 73 y.o.  Subjective:   Patient ID:  Donna Dillon is a 73 y.o. (DOB 14-Dec-1945) female.  Chief Complaint:  Chief Complaint  Patient presents with  . Follow-up    Medication Management   . Other    OCD    HPI Donna Dillon presents to the office today for follow-up of OCD.    Last seen May 13, 2019.  No meds were changed.  Still going to work in [employer].   20 hours weekly.  Was home 2 mos. She handled it pretty well.  Unexpectedly calmer.  OCD is still there but handles it well.  Anxiety manageable.  She feels works helps her mental health.  Work hard.  Exercises daily.   Patient reports stable mood and denies depressed or irritable moods.  .  Patient denies difficulty with sleep initiation or maintenance. Plenty of sleep.. Denies appetite disturbance.  Patient reports that energy and motivation have been good.  Patient denies any difficulty with concentration.  Patient denies any suicidal ideation.  Long hx OCD which was severe and failed multiple SSRI but responded best to clomipramine and has maintained benefit from the med.  OCD generally controlled except when very tired.  Can sometimes reason her way out of the OCD, obsessions which she could not do before the meds.  No SE from meds.  Cog is intact.  Still working PT for mental health reasons.    Thinks the benefit of clonazepam is worth the cognitive risks.  Medicallly necessary.  Exercise 7 days a week.    Son Donna Dillon Cancer doing ok in computers. 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  Surprisingly well. Still has compulsions including night eating.  Married since 2016.   Hasn't travelled lately but wants to.  Enjoys [place]. [place].  Has a dog.  Past Psychiatric Medication Trials:  Clomipramine,  Poor response SSRIs   Review of Systems:  Review of Systems  Respiratory: Negative for shortness of breath.   Gastrointestinal: Positive for abdominal pain.  Neurological: Negative  for tremors and weakness.  Psychiatric/Behavioral: Negative for agitation, behavioral problems, confusion, decreased concentration, dysphoric mood, hallucinations, self-injury, sleep disturbance and suicidal ideas. The patient is nervous/anxious. The patient is not hyperactive.     Medications: I have reviewed the patient's current medications.  Current Outpatient Medications  Medication Sig Dispense Refill  . atenolol (TENORMIN) 25 MG tablet Take 1 tablet Daily for BP 90 tablet 3  . Calcium 1500 MG tablet Take 1,500 mg by mouth daily.     . clomiPRAMINE (ANAFRANIL) 50 MG capsule Take 1 capsule (50 mg total) by mouth at bedtime. 90 capsule 3  . clonazePAM (KLONOPIN) 2 MG tablet Take 1 tablet (2 mg total) by mouth at bedtime. 90 tablet 1  . ezetimibe (ZETIA) 10 MG tablet Take 1 tablet Daily for Cholesterol 90 tablet 3  . fish oil-omega-3 fatty acids 1000 MG capsule Take 1 g by mouth daily.    . magnesium 30 MG tablet Take 30 mg by mouth daily.     . Multiple Vitamin (MULTIVITAMIN) tablet Take 1 tablet by mouth daily.    . Multiple Vitamins-Minerals (EYE VITAMINS PO) Take 1 capsule by mouth daily. Ocusight Vitamins    . omeprazole (PRILOSEC) 40 MG capsule Take 1 capsule (40 mg total) by mouth daily. 30 capsule 11  . vitamin C (ASCORBIC ACID) 500 MG tablet Take 500 mg by mouth daily.    Marland Kitchen VITAMIN D  PO Take 5,000 Units by mouth. Takes 1 to 2 capsules daily.    Marland Kitchen zinc sulfate (ZINC-220) 220 (50 Zn) MG capsule Take 220 mg by mouth daily.     No current facility-administered medications for this visit.    Medication Side Effects: None  Allergies:  Allergies  Allergen Reactions  . Prednisone     Altered mental state  . Penicillins   . Asa [Aspirin] Diarrhea  . Levaquin [Levofloxacin] Other (See Comments)    Joint Pain  . Macrolides And Ketolides Nausea And Vomiting  . Paxil [Paroxetine Hcl] Nausea And Vomiting  . Tetracyclines & Related Nausea And Vomiting  . Zoloft [Sertraline Hcl]  Nausea And Vomiting  . Sulfa Antibiotics Rash  . Trimethoprim Rash    Past Medical History:  Diagnosis Date  . Anemia    mild  . Anxiety   . Colon polyp    hyperplastic  . Diverticulosis   . Esophageal stricture   . GERD (gastroesophageal reflux disease)    no s/s now- off medicines  . Hyperlipidemia   . Palpitations   . Prediabetes   . Tachycardia     Family History  Problem Relation Age of Onset  . Kidney cancer Father   . Hypertension Mother   . Heart disease Mother   . Colon cancer Paternal Aunt   . Colon polyps Neg Hx   . Esophageal cancer Neg Hx   . Rectal cancer Neg Hx   . Stomach cancer Neg Hx     Social History   Socioeconomic History  . Marital status: Married    Spouse name: Not on file  . Number of children: 1  . Years of education: Not on file  . Highest education level: Not on file  Occupational History  . Occupation: Librarian  Tobacco Use  . Smoking status: Never Smoker  . Smokeless tobacco: Never Used  Substance and Sexual Activity  . Alcohol use: No    Alcohol/week: 0.0 standard drinks  . Drug use: No  . Sexual activity: Not on file  Other Topics Concern  . Not on file  Social History Narrative  . Not on file   Social Determinants of Health   Financial Resource Strain:   . Difficulty of Paying Living Expenses: Not on file  Food Insecurity:   . Worried About Charity fundraiser in the Last Year: Not on file  . Ran Out of Food in the Last Year: Not on file  Transportation Needs:   . Lack of Transportation (Medical): Not on file  . Lack of Transportation (Non-Medical): Not on file  Physical Activity:   . Days of Exercise per Week: Not on file  . Minutes of Exercise per Session: Not on file  Stress:   . Feeling of Stress : Not on file  Social Connections:   . Frequency of Communication with Friends and Family: Not on file  . Frequency of Social Gatherings with Friends and Family: Not on file  . Attends Religious Services: Not on  file  . Active Member of Clubs or Organizations: Not on file  . Attends Archivist Meetings: Not on file  . Marital Status: Not on file  Intimate Partner Violence:   . Fear of Current or Ex-Partner: Not on file  . Emotionally Abused: Not on file  . Physically Abused: Not on file  . Sexually Abused: Not on file    Past Medical History, Surgical history, Social history, and Family history were reviewed  and updated as appropriate.   Son has severe anxiety and is treated also.  Please see review of systems for further details on the patient's review from today.   Objective:   Physical Exam:  LMP 12/09/1994   Physical Exam Constitutional:      General: She is not in acute distress.    Appearance: She is well-developed.  Musculoskeletal:        General: No deformity.  Neurological:     Mental Status: She is alert and oriented to person, place, and time.     Motor: No tremor.     Coordination: Coordination normal.     Gait: Gait normal.  Psychiatric:        Attention and Perception: She is attentive.        Mood and Affect: Mood is anxious. Mood is not depressed. Affect is not labile, blunt, angry or inappropriate.        Speech: Speech normal.        Behavior: Behavior normal.        Thought Content: Thought content normal. Thought content does not include homicidal or suicidal ideation. Thought content does not include homicidal or suicidal plan.        Cognition and Memory: Cognition normal.        Judgment: Judgment normal.     Comments: Insight intact. Residual obsesssions manageable. No auditory or visual hallucinations. No delusions.      Lab Review:     Component Value Date/Time   NA 140 07/12/2019 1536   K 4.1 07/12/2019 1536   CL 105 07/12/2019 1536   CO2 28 07/12/2019 1536   GLUCOSE 97 07/12/2019 1536   BUN 15 07/12/2019 1536   CREATININE 0.73 07/12/2019 1536   CALCIUM 9.4 07/12/2019 1536   PROT 6.5 07/12/2019 1536   ALBUMIN 4.0 05/13/2017 1510    AST 10 07/12/2019 1536   ALT 15 07/12/2019 1536   ALKPHOS 61 05/13/2017 1510   BILITOT 0.5 07/12/2019 1536   GFRNONAA 82 07/12/2019 1536   GFRAA 95 07/12/2019 1536       Component Value Date/Time   WBC 5.5 07/12/2019 1536   RBC 4.62 07/12/2019 1536   HGB 13.4 07/12/2019 1536   HCT 41.0 07/12/2019 1536   PLT 248 07/12/2019 1536   MCV 88.7 07/12/2019 1536   MCH 29.0 07/12/2019 1536   MCHC 32.7 07/12/2019 1536   RDW 12.8 07/12/2019 1536   LYMPHSABS 1,931 07/12/2019 1536   MONOABS 0.8 12/18/2018 1628   EOSABS 160 07/12/2019 1536   BASOSABS 28 07/12/2019 1536    No results found for: POCLITH, LITHIUM   No results found for: PHENYTOIN, PHENOBARB, VALPROATE, CBMZ   .res Assessment: Plan:    Mixed obsessional thoughts and acts  Insomnia due to mental condition - Plan: clonazePAM (KLONOPIN) 2 MG tablet  Major depression, recurrent, full remission ([place])  Depression - Plan: clomiPRAMINE (ANAFRANIL) 50 MG capsule   We discussed the short-term risks associated with benzodiazepines including sedation and increased fall risk among others.  Discussed long-term side effect risk including dependence, potential withdrawal symptoms, and the potential eventual dose-related risk of dementia. She accepts the risk bc feels she must have the clonazepam and clomipramine.  Disc cardiac risks of the clomipramine.  Her cognition is very good.  Insomnia managed.  Supportive therapy on relapse prevention.  She feels it is manageable.  Overall OCD is managed fairly well with low dose clomipramine.  Consider repeat blood level if she ever gets  SE.  PT work also helps bc it's a distraction.  Disc CBT techniques to fight the obsessions that remain and occasionally recur.  Enc exercise.  This appt was 30 mins.  FU 6 mos  Lynder Parents, MD, DFAPA   Please see After Visit Summary for patient specific instructions.  Future Appointments  Date Time Provider [place]  01/20/2020  2:30 PM  Unk Pinto, MD [facility] None  05/02/2020 11:15 AM Garnet Sierras, NP [facility] None  08/15/2020  3:00 PM Unk Pinto, MD [facility] None    No orders of the defined types were placed in this encounter.     -------------------------------

## 2019-11-29 ENCOUNTER — Other Ambulatory Visit: Payer: Medicare Other

## 2019-12-09 ENCOUNTER — Ambulatory Visit: Payer: Medicare Other | Admitting: Internal Medicine

## 2019-12-09 NOTE — Progress Notes (Signed)
                                                                                                                                                                                                                                                                                                  R E S  C  H  E  D U  L  ED A  T A  P  P  " T          T  I  M  E                         This very nice 73 y.o. MWF  presents for 3 month follow up with HTN, HLD, Pre-Diabetes and Vitamin D Deficiency. Patient has GERD controlled on her meds.      Patient is treated for HTN & BP has been controlled at home. Today's  . Patient has had no complaints of any cardiac type chest pain, palpitations, dyspnea / orthopnea / PND, dizziness, claudication, or dependent edema.      Hyperlipidemia is controlled with diet & meds. Patient denies myalgias or other med SE's. Last Lipids were not at goal:  Lab Results  Component Value Date   CHOL 191 07/12/2019   HDL 64 07/12/2019   LDLCALC 105 (H) 07/12/2019   TRIG 119 07/12/2019   CHOLHDL 3.0 07/12/2019        Also, the patient has history of PreDiabetes and has had no symptoms of reactive hypoglycemia, diabetic polys, paresthesias or visual blurring.  Last A1c was Normal & at goal:  Lab Results  Component Value Date   HGBA1C 5.5 07/12/2019        Further, the patient also has history of Vitamin D Deficiency and supplements vitamin D without any suspected side-effects. Last vitamin D was at goal:  Lab Results  Component Value Date   VD25OH 59 07/12/2019

## 2019-12-13 ENCOUNTER — Other Ambulatory Visit: Payer: Self-pay

## 2019-12-13 ENCOUNTER — Ambulatory Visit: Payer: Medicare Other | Admitting: Adult Health

## 2019-12-13 ENCOUNTER — Ambulatory Visit: Payer: Medicare Other | Admitting: Internal Medicine

## 2019-12-13 ENCOUNTER — Encounter: Payer: Self-pay | Admitting: Adult Health

## 2019-12-13 VITALS — BP 104/70 | HR 83 | Temp 97.2°F | Wt 153.0 lb

## 2019-12-13 DIAGNOSIS — Z7251 High risk heterosexual behavior: Secondary | ICD-10-CM

## 2019-12-13 DIAGNOSIS — N9089 Other specified noninflammatory disorders of vulva and perineum: Secondary | ICD-10-CM | POA: Diagnosis not present

## 2019-12-13 DIAGNOSIS — Z124 Encounter for screening for malignant neoplasm of cervix: Secondary | ICD-10-CM

## 2019-12-13 NOTE — Progress Notes (Signed)
Assessment and Plan:  Diagnoses and all orders for this visit:  Burning sensation of female perineum High risk sexual behavior, unspecified type ? Of possible exposure per patient; requests STD screening today Denies vaginal discharge; none obvious on exam today ? Intermittent burning r/t atrophy, very narrow introitus; otherwise normal exam today Patient is reassured by this today Follow up if needed for persistent sx if screening neg -     Routine PAP w/ STDs -     RPR -     HIV antibody  Screening for cervical cancer If normal none further needed secondary to age -     Routine PAP w/ STDs  Further disposition pending results of labs. Discussed med's effects and SE's.   Over 30 minutes of exam, counseling, chart review, and critical decision making was performed.   Future Appointments  Date Time Provider [place]  01/20/2020  2:30 PM Unk Pinto, MD [facility] None  05/02/2020 11:15 AM Garnet Sierras, NP [facility] None  05/18/2020  4:30 PM Cottle, Billey Co., MD [facility] None  08/15/2020  3:00 PM Unk Pinto, MD [facility] None    ------------------------------------------------------------------------------------------------------------------   HPI BP 104/70   Pulse 83   Temp (!) 97.2 F (36.2 C)   Wt 153 lb (69.4 kg)   LMP 12/09/1994   SpO2 97%   BMI 23.96 kg/m   74 y.o.female with hx of c sec x 1 presents requesting a PAP smear and STD tessting; reports last PAP was remote, was following with GYN but stopped, recently was thinking she should get checked. She is also reporting intermittent perineal burning, concerned about possible infidelity with husband and wants full STIs checked. Denies vaginal discharge.   Past Medical History:  Diagnosis Date  . Anemia    mild  . Anxiety   . Colon polyp    hyperplastic  . Diverticulosis   . Esophageal stricture   . GERD (gastroesophageal reflux disease)    no s/s now- off medicines  . Hyperlipidemia    . Palpitations   . Prediabetes   . Tachycardia      Allergies  Allergen Reactions  . Prednisone     Altered mental state  . Penicillins   . Asa [Aspirin] Diarrhea  . Levaquin [Levofloxacin] Other (See Comments)    Joint Pain  . Macrolides And Ketolides Nausea And Vomiting  . Paxil [Paroxetine Hcl] Nausea And Vomiting  . Tetracyclines & Related Nausea And Vomiting  . Zoloft [Sertraline Hcl] Nausea And Vomiting  . Sulfa Antibiotics Rash  . Trimethoprim Rash    Current Outpatient Medications on File Prior to Visit  Medication Sig  . atenolol (TENORMIN) 25 MG tablet Take 1 tablet Daily for BP  . Calcium 1500 MG tablet Take 1,500 mg by mouth daily.   . clonazePAM (KLONOPIN) 2 MG tablet Take 1 tablet (2 mg total) by mouth at bedtime.  Marland Kitchen ezetimibe (ZETIA) 10 MG tablet Take 1 tablet Daily for Cholesterol  . fish oil-omega-3 fatty acids 1000 MG capsule Take 1 g by mouth daily.  . magnesium 30 MG tablet Take 30 mg by mouth daily.   . Multiple Vitamin (MULTIVITAMIN) tablet Take 1 tablet by mouth daily.  . Multiple Vitamins-Minerals (EYE VITAMINS PO) Take 1 capsule by mouth daily. Ocusight Vitamins  . omeprazole (PRILOSEC) 40 MG capsule Take 1 capsule (40 mg total) by mouth daily.  . vitamin C (ASCORBIC ACID) 500 MG tablet Take 500 mg by mouth daily.  Marland Kitchen VITAMIN D PO Take  5,000 Units by mouth. Takes 1 to 2 capsules daily.  Marland Kitchen zinc sulfate (ZINC-220) 220 (50 Zn) MG capsule Take 220 mg by mouth daily.  . clomiPRAMINE (ANAFRANIL) 50 MG capsule Take 1 capsule (50 mg total) by mouth at bedtime. (Patient not taking: Reported on 12/13/2019)   No current facility-administered medications on file prior to visit.    ROS: all negative except above.   Physical Exam:  BP 104/70   Pulse 83   Temp (!) 97.2 F (36.2 C)   Wt 153 lb (69.4 kg)   LMP 12/09/1994   SpO2 97%   BMI 23.96 kg/m   General Appearance: Well nourished, in no apparent distress. Eyes: PERRLA, conjunctiva no swelling or  erythema ENT/Mouth: Mask in place; Hearing normal.  Neck: Supple Respiratory: Respiratory effort normal Abdomen: Soft, + BS.  Non tender, no guarding, rebound, hernias, masses. Lymphatics: Non tender without lymphadenopathy.  Musculoskeletal: No obvious deformity, normal gait.  Skin: Warm, dry without rashes, lesions, ecchymosis.  Neuro: Normal muscle tone Psych: Awake and oriented X 3, somewhat anxious affect, Insight and Judgment fair.    Female genitalia: Vulva: normal appearing vulva with no masses, tenderness or lesions; narrow introitus Vagina: atrophic Cervix: no discharge noted, cervical motion tenderness absent, lesions absent and multiparous os Uterus: uterus is normal size, shape, consistency and nontender and appears mildly prolapsed with cervix descended Adnexa: normal adnexa in size, nontender and no masses Rectal: sphincter tone normal   Izora Ribas, NP 3:36 PM [facility]

## 2019-12-13 NOTE — Patient Instructions (Signed)

## 2019-12-14 LAB — C. TRACHOMATIS/N. GONORRHOEAE RNA
C. trachomatis RNA, TMA: NOT DETECTED
N. gonorrhoeae RNA, TMA: NOT DETECTED

## 2019-12-14 LAB — PAP IG (IMAGE GUIDED)

## 2019-12-14 LAB — RPR: RPR Ser Ql: NONREACTIVE

## 2019-12-14 LAB — HIV ANTIBODY (ROUTINE TESTING W REFLEX): HIV 1&2 Ab, 4th Generation: NONREACTIVE

## 2019-12-14 LAB — PAP W/AGE BASED SCRN W/CT/NG/TRICH

## 2019-12-14 LAB — T. VAGINALIS RNA, QL TMA, PAP VIAL: T. vaginalis RNA, QL TMA: NOT DETECTED

## 2019-12-29 ENCOUNTER — Other Ambulatory Visit: Payer: Self-pay

## 2019-12-29 ENCOUNTER — Encounter: Payer: Self-pay | Admitting: Adult Health

## 2019-12-29 ENCOUNTER — Ambulatory Visit (INDEPENDENT_AMBULATORY_CARE_PROVIDER_SITE_OTHER): Payer: Medicare PPO | Admitting: Adult Health

## 2019-12-29 VITALS — BP 110/60 | HR 72 | Temp 97.7°F | Wt 152.6 lb

## 2019-12-29 DIAGNOSIS — Z124 Encounter for screening for malignant neoplasm of cervix: Secondary | ICD-10-CM

## 2019-12-29 DIAGNOSIS — I10 Essential (primary) hypertension: Secondary | ICD-10-CM | POA: Diagnosis not present

## 2019-12-29 NOTE — Progress Notes (Signed)
Assessment and Plan:  Donna Dillon was seen today for gynecologic exam.  Diagnoses and all orders for this visit:  Essential hypertension Continue medication Monitor blood pressure at home; call if consistently over 130/80 Continue DASH diet.   Reminder to go to the ER if any CP, SOB, nausea, dizziness, severe HA, changes vision/speech, left arm numbness and tingling and jaw pain.  Screening for cervical cancer Last ? 10 years ago, repeat once last before stopping per patient preference Good exam today with cervical os clearly visualized; otherwise normal exam without concerns She will continue to monitor for any spotting or bleeding and educated this is a sign that should be reported immediately -     PAP with HPV  Further disposition pending results of labs. Discussed med's effects and SE's.   Over 15 minutes of exam, counseling, chart review, and critical decision making was performed.   Future Appointments  Date Time Provider [place]  01/20/2020  2:30 PM Donna Pinto, MD [facility] None  05/02/2020 11:15 AM Donna Sierras, NP [facility] None  05/18/2020  4:30 PM Donna, Billey Co., MD [facility] None  08/15/2020  3:00 PM Donna Pinto, MD [facility] None    ------------------------------------------------------------------------------------------------------------------   HPI BP 110/60   Pulse 72   Temp 97.7 F (36.5 C)   Wt 152 lb 9.6 oz (69.2 kg)   LMP 12/09/1994   SpO2 98%   BMI 23.90 kg/m   74 y.o.female presents for follow up for repeat PAP due to last attempted on 12/13/2019, difficult exam due to narrow introitus with insufficient specimen for evaluation. She did have STD testing at that time per her request, HIV, RPR, GC/Chl were negative at that time. She reported perineal burning at that time, exam showed narrow introitus and atrophic tissues without other abnormality.   She has benign GYN hx other than late menopause past age 56, her mother had simliar,  Dr. Tamala Julian did endometrial biopsy which was normal. Last PAP estimated ? 60. She denies any spotting/bleeding, vaginal discharge, pain or other concerns today.    Past Medical History:  Diagnosis Date  . Anemia    mild  . Anxiety   . Colon polyp    hyperplastic  . Diverticulosis   . Esophageal stricture   . GERD (gastroesophageal reflux disease)    no s/s now- off medicines  . Hyperlipidemia   . Palpitations   . Prediabetes   . Tachycardia      Allergies  Allergen Reactions  . Prednisone     Altered mental state  . Penicillins   . Asa [Aspirin] Diarrhea  . Levaquin [Levofloxacin] Other (See Comments)    Joint Pain  . Macrolides And Ketolides Nausea And Vomiting  . Paxil [Paroxetine Hcl] Nausea And Vomiting  . Tetracyclines & Related Nausea And Vomiting  . Zoloft [Sertraline Hcl] Nausea And Vomiting  . Sulfa Antibiotics Rash  . Trimethoprim Rash    Current Outpatient Medications on File Prior to Visit  Medication Sig  . atenolol (TENORMIN) 25 MG tablet Take 1 tablet Daily for BP  . Calcium 1500 MG tablet Take 1,500 mg by mouth daily.   . clonazePAM (KLONOPIN) 2 MG tablet Take 1 tablet (2 mg total) by mouth at bedtime.  Marland Kitchen ezetimibe (ZETIA) 10 MG tablet Take 1 tablet Daily for Cholesterol  . fish oil-omega-3 fatty acids 1000 MG capsule Take 1 g by mouth daily.  . magnesium 30 MG tablet Take 30 mg by mouth daily.   . Multiple Vitamin (  MULTIVITAMIN) tablet Take 1 tablet by mouth daily.  . Multiple Vitamins-Minerals (EYE VITAMINS PO) Take 1 capsule by mouth daily. Ocusight Vitamins  . omeprazole (PRILOSEC) 40 MG capsule Take 1 capsule (40 mg total) by mouth daily.  . vitamin C (ASCORBIC ACID) 500 MG tablet Take 500 mg by mouth daily.  Marland Kitchen VITAMIN D PO Take 5,000 Units by mouth. Takes 1 to 2 capsules daily.  Marland Kitchen zinc sulfate (ZINC-220) 220 (50 Zn) MG capsule Take 220 mg by mouth daily.  . clomiPRAMINE (ANAFRANIL) 50 MG capsule Take 1 capsule (50 mg total) by mouth at bedtime.  (Patient not taking: Reported on 12/13/2019)   No current facility-administered medications on file prior to visit.    ROS: all negative except above.   Physical Exam:  BP 110/60   Pulse 72   Temp 97.7 F (36.5 C)   Wt 152 lb 9.6 oz (69.2 kg)   LMP 12/09/1994   SpO2 98%   BMI 23.90 kg/m   General Appearance: Well nourished, in no apparent distress. Eyes: conjunctiva no swelling or erythema ENT/Mouth: Mask in place; Hearing normal.  Neck: Supple, thyroid normal.  Respiratory: Respiratory effort normal, BS equal bilaterally without rales, rhonchi, wheezing or stridor.  Cardio: RRR with no MRGs. Brisk peripheral pulses without edema.  Abdomen: Soft, + BS.  Non tender, no guarding, rebound, hernias, masses. Female genitalia: normal external genitalia, vulva, vagina, cervix, uterus and adnexa Lymphatics: Non tender without lymphadenopathy.  Musculoskeletal: No obvious deformity, normal gait.  Skin: Warm, dry without rashes, lesions, ecchymosis.  Neuro: Normal muscle tone Psych: Awake and oriented X 3, anxious affect, Insight and Judgment appropriate.     Izora Ribas, NP 3:08 PM [name]

## 2020-01-04 ENCOUNTER — Other Ambulatory Visit: Payer: Self-pay | Admitting: Adult Health

## 2020-01-07 LAB — PAP IG (IMAGE GUIDED)

## 2020-01-07 LAB — PAP, TP IMAGING, WNL RFLX HPV

## 2020-01-07 LAB — HPV TYPE 16 AND 18/45 RNA
HPV Type 16 RNA: NOT DETECTED
HPV Type 18/45 RNA: NOT DETECTED

## 2020-01-07 LAB — HPV MRNA, HIGH RISK, RFLX 16,18/45: HPV DNA High Risk: DETECTED — AB

## 2020-01-19 ENCOUNTER — Encounter: Payer: Self-pay | Admitting: Internal Medicine

## 2020-01-19 NOTE — Patient Instructions (Signed)

## 2020-01-19 NOTE — Progress Notes (Signed)
History of Present Illness:      This very nice 74 y.o. MWF  presents for 6 month follow up with HTN, HLD, Pre-Diabetes and Vitamin D Deficiency. Patient has GERD controlled on her meds.   Patient is followed on low dose Atenolol with Labile HTN circa 1990's & BP has been controlled at home. Today's BP is at goal 123/83 sitting  & 116/81 P 89 standing). Patient has had no complaints of any cardiac type chest pain, palpitations, dyspnea / orthopnea / PND, dizziness, claudication, or dependent edema.  Hyperlipidemia is controlled with diet & meds. Patient denies myalgias or other med SE's. Last Lipids were near  Goal except elevated Trig's:  Lab Results  Component Value Date   CHOL 188 01/20/2020   HDL 66 01/20/2020   LDLCALC 91 01/20/2020   TRIG 217 (H) 01/20/2020   CHOLHDL 2.8 01/20/2020    Also, the patient has history of PreDiabetes (A1c 5.8% / 2011) and has had no symptoms of reactive hypoglycemia, diabetic polys, paresthesias or visual blurring.  Last A1c was Normal & at goal:  Lab Results  Component Value Date   HGBA1C 5.4 01/20/2020        Further, the patient also has history of Vitamin D Deficiency ("36" / 2008) and supplements vitamin D without any suspected side-effects. Last vitamin D was near goal:  Lab Results  Component Value Date   VD25OH 43 01/20/2020    Current Outpatient Medications on File Prior to Visit  Medication Sig  . atenolol (TENORMIN) 25 MG tablet Take 1 tablet Daily for BP  . Calcium 1500 MG tablet Take 1,500 mg by mouth daily.   . clomiPRAMINE (ANAFRANIL) 50 MG capsule Take 1 capsule (50 mg total) by mouth at bedtime.  . clonazePAM (KLONOPIN) 2 MG tablet Take 1 tablet (2 mg total) by mouth at bedtime.  Marland Kitchen ezetimibe (ZETIA) 10 MG tablet Take 1 tablet Daily for Cholesterol  . fish oil-omega-3 fatty acids 1000 MG capsule Take 1 g by mouth daily.  . magnesium 30 MG tablet Take 30 mg by mouth daily.   . Multiple Vitamin (MULTIVITAMIN) tablet  Take 1 tablet by mouth daily.  . Multiple Vitamins-Minerals (EYE VITAMINS PO) Take 1 capsule by mouth daily. Ocusight Vitamins  . omeprazole (PRILOSEC) 40 MG capsule Take 1 capsule (40 mg total) by mouth daily.  . vitamin C (ASCORBIC ACID) 500 MG tablet Take 500 mg by mouth daily.  Marland Kitchen VITAMIN D PO Take 5,000 Units by mouth. Takes 1 to 2 capsules daily.  Marland Kitchen zinc sulfate (ZINC-220) 220 (50 Zn) MG capsule Take 220 mg by mouth daily.   No current facility-administered medications on file prior to visit.    Allergies  Allergen Reactions  . Prednisone     Altered mental state  . Penicillins   . Asa [Aspirin] Diarrhea  . Levaquin [Levofloxacin] Other (See Comments)    Joint Pain  . Macrolides And Ketolides Nausea And Vomiting  . Paxil [Paroxetine Hcl] Nausea And Vomiting  . Tetracyclines & Related Nausea And Vomiting  . Zoloft [Sertraline Hcl] Nausea And Vomiting  . Sulfa Antibiotics Rash  . Trimethoprim Rash     PMHx:   Past Medical History:  Diagnosis Date  . Anemia    mild  . Anxiety   . Colon polyp    hyperplastic  . Diverticulosis   . Esophageal stricture   . GERD (gastroesophageal reflux disease)    no s/s now- off medicines  .  Hyperlipidemia   . Palpitations   . Prediabetes   . Tachycardia    Immunization History  Administered Date(s) Administered  . Influenza, High Dose Seasonal PF 11/14/2014, 10/10/2016, 12/03/2017, 11/03/2018  . Pneumococcal Conjugate-13 09/21/2015  . Pneumococcal Polysaccharide-23 02/14/2009, 05/13/2017  . Td 12/11/2006, 12/03/2017  . Tdap 12/09/2005  . Zoster 02/27/2011   Past Surgical History:  Procedure Laterality Date  . BREAST BIOPSY Left   . CESAREAN SECTION     x1  . COLONOSCOPY  2017  . eye lift    . POLYPECTOMY    . TONSILLECTOMY    . UPPER GASTROINTESTINAL ENDOSCOPY      FHx:    Reviewed / unchanged  SHx:    Reviewed / unchanged   Systems Review:  Constitutional: Denies fever, chills, wt changes, headaches, insomnia,  fatigue, night sweats, change in appetite. Eyes: Denies redness, blurred vision, diplopia, discharge, itchy, watery eyes.  ENT: Denies discharge, congestion, post nasal drip, epistaxis, sore throat, earache, hearing loss, dental pain, tinnitus, vertigo, sinus pain, snoring.  CV: Denies chest pain, palpitations, irregular heartbeat, syncope, dyspnea, diaphoresis, orthopnea, PND, claudication or edema. Respiratory: denies cough, dyspnea, DOE, pleurisy, hoarseness, laryngitis, wheezing.  Gastrointestinal: Denies dysphagia, odynophagia, heartburn, reflux, water brash, abdominal pain or cramps, nausea, vomiting, bloating, diarrhea, constipation, hematemesis, melena, hematochezia  or hemorrhoids. Genitourinary: Denies dysuria, frequency, urgency, nocturia, hesitancy, discharge, hematuria or flank pain. Musculoskeletal: Denies arthralgias, myalgias, stiffness, jt. swelling, pain, limping or strain/sprain.  Skin: Denies pruritus, rash, hives, warts, acne, eczema or change in skin lesion(s). Neuro: No weakness, tremor, incoordination, spasms, paresthesia or pain. Psychiatric: Denies confusion, memory loss or sensory loss. Endo: Denies change in weight, skin or hair change.  Heme/Lymph: No excessive bleeding, bruising or enlarged lymph nodes.  Physical Exam  BP 123/83 Comment: 123/83 sitting & 116/81 P 89 standing  Pulse 76   Temp (!) 97.2 F (36.2 C)   Resp 16   Ht 5\' 7"  (1.702 m)   Wt 154 lb 6.4 oz (70 kg)   LMP 12/09/1994   BMI 24.18 kg/m   Appears  well nourished, well groomed  and in no distress.  Eyes: PERRLA, EOMs, conjunctiva no swelling or erythema. Sinuses: No frontal/maxillary tenderness ENT/Mouth: EAC's clear, TM's nl w/o erythema, bulging. Nares clear w/o erythema, swelling, exudates. Oropharynx clear without erythema or exudates. Oral hygiene is good. Tongue normal, non obstructing. Hearing intact.  Neck: Supple. Thyroid not palpable. Car 2+/2+ without bruits, nodes or  JVD. Chest: Respirations nl with BS clear & equal w/o rales, rhonchi, wheezing or stridor.  Cor: Heart sounds normal w/ regular rate and rhythm without sig. murmurs, gallops, clicks or rubs. Peripheral pulses normal and equal  without edema.  Abdomen: Soft & bowel sounds normal. Non-tender w/o guarding, rebound, hernias, masses or organomegaly.  Lymphatics: Unremarkable.  Musculoskeletal: Full ROM all peripheral extremities, joint stability, 5/5 strength and normal gait.  Skin: Warm, dry without exposed rashes, lesions or ecchymosis apparent.  Neuro: Cranial nerves intact, reflexes equal bilaterally. Sensory-motor testing grossly intact. Tendon reflexes grossly intact.  Pysch: Alert & oriented x 3.  Insight and judgement nl & appropriate. No ideations.  Assessment and Plan:  1. Essential hypertension  - Continue medication, monitor blood pressure at home.  - Continue DASH diet.  Reminder to go to the ER if any CP,  SOB, nausea, dizziness, severe HA, changes vision/speech.  - CBC with Differential/Platelet - COMPLETE METABOLIC PANEL WITH GFR - Magnesium - TSH  2. Hyperlipidemia, mixed  - Continue  diet/meds, exercise,& lifestyle modifications.  - Continue monitor periodic cholesterol/liver & renal functions   - Lipid panel - TSH  3. Abnormal glucose  - Continue diet, exercise  - Lifestyle modifications.  - Monitor appropriate labs.  - Hemoglobin A1c - Insulin, random  4. Vitamin D deficiency  - Continue supplementation.  - VITAMIN D 25 Hydroxy  5. Gastroesophageal reflux disease  - CBC with Differential/Platelet  6. Medication management  - CBC with Differential/Platelet - COMPLETE METABOLIC PANEL WITH GFR - Magnesium - Lipid panel - TSH - Hemoglobin A1c - Insulin, random - VITAMIN D 25 Hydroxy         Discussed  regular exercise, BP monitoring, weight control to achieve/maintain BMI less than 25 and discussed med and SE's. Recommended labs to assess and  monitor clinical status with further disposition pending results of labs.  I discussed the assessment and treatment plan with the patient. The patient was provided an opportunity to ask questions and all were answered. The patient agreed with the plan and demonstrated an understanding of the instructions.  I provided over 30 minutes of exam, counseling, chart review and  complex critical decision making.  Kirtland Bouchard,  MD  This note is not being shared with the patient for the following reason: To prevent harm (release of this note would result in harm to the life or physical safety of the patient or  another).

## 2020-01-20 ENCOUNTER — Other Ambulatory Visit: Payer: Self-pay

## 2020-01-20 ENCOUNTER — Ambulatory Visit (INDEPENDENT_AMBULATORY_CARE_PROVIDER_SITE_OTHER): Payer: Medicare PPO | Admitting: Internal Medicine

## 2020-01-20 VITALS — BP 123/83 | HR 76 | Temp 97.2°F | Resp 16 | Ht 67.0 in | Wt 154.4 lb

## 2020-01-20 DIAGNOSIS — K2100 Gastro-esophageal reflux disease with esophagitis, without bleeding: Secondary | ICD-10-CM | POA: Diagnosis not present

## 2020-01-20 DIAGNOSIS — I10 Essential (primary) hypertension: Secondary | ICD-10-CM | POA: Diagnosis not present

## 2020-01-20 DIAGNOSIS — E559 Vitamin D deficiency, unspecified: Secondary | ICD-10-CM | POA: Diagnosis not present

## 2020-01-20 DIAGNOSIS — E782 Mixed hyperlipidemia: Secondary | ICD-10-CM | POA: Diagnosis not present

## 2020-01-20 DIAGNOSIS — Z79899 Other long term (current) drug therapy: Secondary | ICD-10-CM | POA: Diagnosis not present

## 2020-01-20 DIAGNOSIS — R7309 Other abnormal glucose: Secondary | ICD-10-CM

## 2020-01-21 LAB — CBC WITH DIFFERENTIAL/PLATELET
Absolute Monocytes: 583 cells/uL (ref 200–950)
Basophils Absolute: 40 cells/uL (ref 0–200)
Basophils Relative: 0.6 %
Eosinophils Absolute: 181 cells/uL (ref 15–500)
Eosinophils Relative: 2.7 %
HCT: 41.9 % (ref 35.0–45.0)
Hemoglobin: 14 g/dL (ref 11.7–15.5)
Lymphs Abs: 2459 cells/uL (ref 850–3900)
MCH: 29.5 pg (ref 27.0–33.0)
MCHC: 33.4 g/dL (ref 32.0–36.0)
MCV: 88.2 fL (ref 80.0–100.0)
MPV: 10.1 fL (ref 7.5–12.5)
Monocytes Relative: 8.7 %
Neutro Abs: 3437 cells/uL (ref 1500–7800)
Neutrophils Relative %: 51.3 %
Platelets: 235 10*3/uL (ref 140–400)
RBC: 4.75 10*6/uL (ref 3.80–5.10)
RDW: 12.5 % (ref 11.0–15.0)
Total Lymphocyte: 36.7 %
WBC: 6.7 10*3/uL (ref 3.8–10.8)

## 2020-01-21 LAB — HEMOGLOBIN A1C
Hgb A1c MFr Bld: 5.4 % of total Hgb (ref <5.7)
Mean Plasma Glucose: 108 (calc)
eAG (mmol/L): 6 (calc)

## 2020-01-21 LAB — COMPLETE METABOLIC PANEL WITH GFR
AG Ratio: 1.9 (calc) (ref 1.0–2.5)
ALT: 14 U/L (ref 6–29)
AST: 9 U/L — ABNORMAL LOW (ref 10–35)
Albumin: 4.2 g/dL (ref 3.6–5.1)
Alkaline phosphatase (APISO): 66 U/L (ref 37–153)
BUN: 10 mg/dL (ref 7–25)
CO2: 27 mmol/L (ref 20–32)
Calcium: 9.6 mg/dL (ref 8.6–10.4)
Chloride: 106 mmol/L (ref 98–110)
Creat: 0.8 mg/dL (ref 0.60–0.93)
GFR, Est African American: 85 mL/min/{1.73_m2} (ref >=60)
GFR, Est Non African American: 74 mL/min/{1.73_m2} (ref >=60)
Globulin: 2.2 g/dL (calc) (ref 1.9–3.7)
Glucose, Bld: 107 mg/dL — ABNORMAL HIGH (ref 65–99)
Potassium: 4.1 mmol/L (ref 3.5–5.3)
Sodium: 141 mmol/L (ref 135–146)
Total Bilirubin: 0.6 mg/dL (ref 0.2–1.2)
Total Protein: 6.4 g/dL (ref 6.1–8.1)

## 2020-01-21 LAB — LIPID PANEL
Cholesterol: 188 mg/dL (ref <200)
HDL: 66 mg/dL (ref >=50)
LDL Cholesterol (Calc): 91 mg/dL (calc)
Non-HDL Cholesterol (Calc): 122 mg/dL (calc) (ref <130)
Total CHOL/HDL Ratio: 2.8 (calc) (ref <5.0)
Triglycerides: 217 mg/dL — ABNORMAL HIGH (ref <150)

## 2020-01-21 LAB — MAGNESIUM: Magnesium: 2.1 mg/dL (ref 1.5–2.5)

## 2020-01-21 LAB — TSH: TSH: 1.55 mIU/L (ref 0.40–4.50)

## 2020-01-21 LAB — INSULIN, RANDOM: Insulin: 17.4 u[IU]/mL

## 2020-01-21 LAB — VITAMIN D 25 HYDROXY (VIT D DEFICIENCY, FRACTURES): Vit D, 25-Hydroxy: 43 ng/mL (ref 30–100)

## 2020-01-22 ENCOUNTER — Encounter: Payer: Self-pay | Admitting: Internal Medicine

## 2020-01-31 DIAGNOSIS — H43811 Vitreous degeneration, right eye: Secondary | ICD-10-CM | POA: Diagnosis not present

## 2020-02-29 DIAGNOSIS — H43811 Vitreous degeneration, right eye: Secondary | ICD-10-CM | POA: Diagnosis not present

## 2020-03-20 ENCOUNTER — Other Ambulatory Visit: Payer: Self-pay | Admitting: Psychiatry

## 2020-03-20 ENCOUNTER — Telehealth: Payer: Self-pay | Admitting: Psychiatry

## 2020-03-20 NOTE — Telephone Encounter (Signed)
Baker Janus called to report that she is not sleeping.  She takes her klonopin and she can go to sleep but is up again in about 3 hours.  She needs to get some sleep.  Will you please prescribe something to help with this?  She doesn't need anything too strong just something to help.  Next appt 05/18/20.  [employer].

## 2020-03-20 NOTE — Telephone Encounter (Signed)
Verify that she is currently taking the full clonazepam 2 mg nightly.  If she is taking that full dose and still having insomnia then prescribe trazodone 50 mg tablets 1-2 nightly #60 no refills. If she is not taking the full clonazepam dose then just increase that to 2 mg nightly.

## 2020-03-21 ENCOUNTER — Other Ambulatory Visit: Payer: Self-pay

## 2020-03-21 DIAGNOSIS — F5105 Insomnia due to other mental disorder: Secondary | ICD-10-CM

## 2020-03-21 MED ORDER — CLONAZEPAM 2 MG PO TABS: 2.0000 mg | ORAL_TABLET | Freq: Every day | ORAL | 1 refills | Status: DC

## 2020-03-21 NOTE — Telephone Encounter (Signed)
Talked with Donna Dillon.  She is only taking 1mg  so will increase to 2mg  nightly and report back if that helps.  She will need a refill of the clonazepam.  She has changed insurance and is now with [facility] so she wants the prescription to be a 90 day supply and sent to [place], [place].

## 2020-03-21 NOTE — Telephone Encounter (Signed)
Rx for Clonazepam 2 mg #90 pended for Dr. Clovis Pu to submit to [place] Next apt June, last refill 11/22/2020

## 2020-03-23 ENCOUNTER — Other Ambulatory Visit: Payer: Self-pay

## 2020-03-23 ENCOUNTER — Telehealth: Payer: Self-pay | Admitting: Psychiatry

## 2020-03-23 MED ORDER — TRAZODONE HCL 50 MG PO TABS: ORAL_TABLET | ORAL | 0 refills | Status: DC

## 2020-03-23 NOTE — Telephone Encounter (Signed)
Patient will continue Clonazepam 2 mg at hs, she will add on the Trazodone as directed.

## 2020-03-23 NOTE — Telephone Encounter (Signed)
Baker Janus called report that she has taken the 2mg  of clonazepam the past 2 nights and she is still waking up.  She eventually gets back to sleep but it is harder and she is still not getting enough sleep.  If you prescribe the other medications she needs instructions on how to take it with the clonazepam.  Traci can't get hold of her because her # is blocked.  So either Hiram Comber will need to call her or we need to schedule her to come in.  Expect for Friday there is not an available appt.  Let me know if you want to schedule her.  Otherwise, please call her with instructions.

## 2020-03-23 NOTE — Telephone Encounter (Signed)
Per previous phone message if Clonazepam wasn't working recommend Trazodone 50 mg take 1-2 tablets at bedtime for sleep. Rx submitted to [facility].

## 2020-04-07 ENCOUNTER — Telehealth: Payer: Self-pay | Admitting: Psychiatry

## 2020-04-07 NOTE — Telephone Encounter (Signed)
Pt called stating she needs to speak with Dr Clovis Pu. She is currently taking 2 mg of Klonopin which he increased. She is still having a racing heart and getting 2.5 to 3 hrs of sleep. She has an appt scheduled in June. She scheduled a 15 min slot for Monday if Dr Clovis Pu needs to see her before June appt.

## 2020-04-07 NOTE — Telephone Encounter (Signed)
Patient had not picked up her Rx for Trazodone, advised to pick up and try then call back if not effective.

## 2020-04-10 ENCOUNTER — Other Ambulatory Visit: Payer: Self-pay

## 2020-04-10 ENCOUNTER — Other Ambulatory Visit: Payer: Self-pay | Admitting: Psychiatry

## 2020-04-10 ENCOUNTER — Ambulatory Visit (INDEPENDENT_AMBULATORY_CARE_PROVIDER_SITE_OTHER): Payer: Medicare PPO | Admitting: Psychiatry

## 2020-04-10 ENCOUNTER — Encounter: Payer: Self-pay | Admitting: Psychiatry

## 2020-04-10 ENCOUNTER — Telehealth: Payer: Self-pay | Admitting: Psychiatry

## 2020-04-10 VITALS — HR 100

## 2020-04-10 DIAGNOSIS — F422 Mixed obsessional thoughts and acts: Secondary | ICD-10-CM | POA: Diagnosis not present

## 2020-04-10 DIAGNOSIS — F331 Major depressive disorder, recurrent, moderate: Secondary | ICD-10-CM

## 2020-04-10 DIAGNOSIS — F5105 Insomnia due to other mental disorder: Secondary | ICD-10-CM

## 2020-04-10 DIAGNOSIS — F411 Generalized anxiety disorder: Secondary | ICD-10-CM

## 2020-04-10 DIAGNOSIS — R000 Tachycardia, unspecified: Secondary | ICD-10-CM

## 2020-04-10 DIAGNOSIS — I10 Essential (primary) hypertension: Secondary | ICD-10-CM

## 2020-04-10 MED ORDER — CLONAZEPAM 0.5 MG PO TABS: 0.5000 mg | ORAL_TABLET | Freq: Two times a day (BID) | ORAL | 0 refills | Status: DC | PRN

## 2020-04-10 MED ORDER — ATENOLOL 25 MG PO TABS: ORAL_TABLET | ORAL | 3 refills | Status: DC

## 2020-04-10 MED ORDER — QUETIAPINE FUMARATE 25 MG PO TABS: 25.0000 mg | ORAL_TABLET | Freq: Every day | ORAL | 0 refills | Status: DC

## 2020-04-10 NOTE — Patient Instructions (Addendum)
Increase atenolol 25 mg daily  Stop trazodone Start quetiapine 25 mg tablet 1/2-2 tablets at night for sleep and anxiety   Increase clonazepam to 0.5 mg twice daily and continue 2 mg at night  Option call [facility] at the main [facility] phone.  Tell them I referred you to the Day treatment program.

## 2020-04-10 NOTE — Telephone Encounter (Signed)
Prescription sent

## 2020-04-10 NOTE — Telephone Encounter (Signed)
Pharmacy called stating that Donna Dillon was looking for a seroquel script from dr. Clovis Pu. [name] was seen today and she would like to start it today. Please saend [facility]

## 2020-04-10 NOTE — Progress Notes (Signed)
Donna Dillon [id number] [birth year] 74 y.o.  Subjective:   Patient ID:  Donna Dillon is a 74 y.o. (DOB 1945/12/19) female.  Chief Complaint:  Chief Complaint  Patient presents with  . Anxiety    "falling apart"  . Depression  . Follow-up  . Sleeping Problem    HPI Donna Dillon presents to the office today for follow-up of OCD.    Last seen December 2020.  No meds were changed.  "falling apart".  More fatigued since dilation of esophagus.  January more dry eyes.  Hair falling out.  TSH is normal.   More sleep problems since Feb. Called and increased clonazepam to 2 mg HS bc also the insomnia was causing more anxiety.  Now also scared of taking the clonazepam.   Added trazodone 100 mg and it didn't help sleep. More obsessed with heart rate being elevated to 100 bpm.   Can't work DT anxiety and insomnia and fear over her heart. Scared.  Long hx OCD which was severe and failed multiple SSRI but responded best to clomipramine and has maintained benefit from the med.  OCD generally controlled except when very tired.  Can sometimes reason her way out of the OCD, obsessions which she could not do before the meds.  No SE from meds.  Cog is intact.  Still working PT for mental health reasons.    Thinks the benefit of clonazepam is worth the cognitive risks.  Medicallly necessary.  Exercise 7 days a week.    Son Donna Dillon doing ok in computers. 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  Surprisingly well. Still has compulsions including night eating.  Married since 2016.   Hasn't travelled lately but wants to.  Enjoys [place]. [place].  Has a dog.  Past Psychiatric Medication Trials:  Clomipramine,  Poor response SSRIs  Trazodone 100 NR   Review of Systems:  Review of Systems  Constitutional: Positive for unexpected weight change.       Lost 10-12 # in 3 weeks  Respiratory: Negative for shortness of breath.   Gastrointestinal: Positive for abdominal pain.  Neurological:  Negative for tremors and weakness.  Psychiatric/Behavioral: Negative for agitation, behavioral problems, confusion, decreased concentration, dysphoric mood, hallucinations, self-injury, sleep disturbance and suicidal ideas. The patient is nervous/anxious. The patient is not hyperactive.     Medications: I have reviewed the patient's current medications.  Current Outpatient Medications  Medication Sig Dispense Refill  . atenolol (TENORMIN) 25 MG tablet Take 1 tablet Daily for BP 90 tablet 3  . Calcium 1500 MG tablet Take 1,500 mg by mouth daily.     . clomiPRAMINE (ANAFRANIL) 50 MG capsule Take 1 capsule (50 mg total) by mouth at bedtime. 90 capsule 3  . clonazePAM (KLONOPIN) 2 MG tablet Take 1 tablet (2 mg total) by mouth at bedtime. 90 tablet 1  . ezetimibe (ZETIA) 10 MG tablet Take 1 tablet Daily for Cholesterol 90 tablet 3  . fish oil-omega-3 fatty acids 1000 MG capsule Take 1 g by mouth daily.    . magnesium 30 MG tablet Take 30 mg by mouth daily.     . Multiple Vitamin (MULTIVITAMIN) tablet Take 1 tablet by mouth daily.    . Multiple Vitamins-Minerals (EYE VITAMINS PO) Take 1 capsule by mouth daily. Ocusight Vitamins    . omeprazole (PRILOSEC) 40 MG capsule Take 1 capsule (40 mg total) by mouth daily. 30 capsule 11  . vitamin C (ASCORBIC ACID) 500 MG tablet Take 500 mg  by mouth daily.    Marland Kitchen VITAMIN D PO Take 5,000 Units by mouth. Takes 1 to 2 capsules daily.    Marland Kitchen zinc sulfate (ZINC-220) 220 (50 Zn) MG capsule Take 220 mg by mouth daily.    . clonazePAM (KLONOPIN) 0.5 MG tablet Take 1 tablet (0.5 mg total) by mouth 2 (two) times daily as needed for anxiety. 60 tablet 0   No current facility-administered medications for this visit.    Medication Side Effects: None  Allergies:  Allergies  Allergen Reactions  . Prednisone     Altered mental state  . Penicillins   . Asa [Aspirin] Diarrhea  . Levaquin [Levofloxacin] Other (See Comments)    Joint Pain  . Macrolides And Ketolides  Nausea And Vomiting  . Paxil [Paroxetine Hcl] Nausea And Vomiting  . Tetracyclines & Related Nausea And Vomiting  . Zoloft [Sertraline Hcl] Nausea And Vomiting  . Sulfa Antibiotics Rash  . Trimethoprim Rash    Past Medical History:  Diagnosis Date  . Anemia    mild  . Anxiety   . Colon polyp    hyperplastic  . Diverticulosis   . Esophageal stricture   . GERD (gastroesophageal reflux disease)    no s/s now- off medicines  . Hyperlipidemia   . Palpitations   . Prediabetes   . Tachycardia     Family History  Problem Relation Age of Onset  . Kidney Dillon Father   . Hypertension Mother   . Heart disease Mother   . Colon Dillon Paternal Aunt   . Colon polyps Neg Hx   . Esophageal Dillon Neg Hx   . Rectal Dillon Neg Hx   . Stomach Dillon Neg Hx     Social History   Socioeconomic History  . Marital status: Married    Spouse name: Not on file  . Number of children: 1  . Years of education: Not on file  . Highest education level: Not on file  Occupational History  . Occupation: Librarian  Tobacco Use  . Smoking status: Never Smoker  . Smokeless tobacco: Never Used  Substance and Sexual Activity  . Alcohol use: No    Alcohol/week: 0.0 standard drinks  . Drug use: No  . Sexual activity: Not on file  Other Topics Concern  . Not on file  Social History Narrative  . Not on file   Social Determinants of Health   Financial Resource Strain:   . Difficulty of Paying Living Expenses:   Food Insecurity:   . Worried About Charity fundraiser in the Last Year:   . Arboriculturist in the Last Year:   Transportation Needs:   . Film/video editor (Medical):   Marland Kitchen Lack of Transportation (Non-Medical):   Physical Activity:   . Days of Exercise per Week:   . Minutes of Exercise per Session:   Stress:   . Feeling of Stress :   Social Connections:   . Frequency of Communication with Friends and Family:   . Frequency of Social Gatherings with Friends and Family:   .  Attends Religious Services:   . Active Member of Clubs or Organizations:   . Attends Archivist Meetings:   Marland Kitchen Marital Status:   Intimate Partner Violence:   . Fear of Current or Ex-Partner:   . Emotionally Abused:   Marland Kitchen Physically Abused:   . Sexually Abused:     Past Medical History, Surgical history, Social history, and Family history were reviewed and  updated as appropriate.   Son has severe anxiety and is treated also.  Please see review of systems for further details on the patient's review from today.   Objective:   Physical Exam:  Pulse 100   LMP 12/09/1994   Physical Exam Constitutional:      General: She is not in acute distress.    Appearance: She is well-developed.  Musculoskeletal:        General: No deformity.  Neurological:     Mental Status: She is alert and oriented to person, place, and time.     Motor: No tremor.     Coordination: Coordination normal.     Gait: Gait normal.  Psychiatric:        Attention and Perception: She is attentive.        Mood and Affect: Mood is anxious and depressed. Affect is tearful. Affect is not labile, blunt, angry or inappropriate.        Speech: Speech normal.        Behavior: Behavior normal.        Thought Content: Thought content normal. Thought content does not include homicidal or suicidal ideation. Thought content does not include homicidal or suicidal plan.        Cognition and Memory: Cognition normal.        Judgment: Judgment normal.     Comments: Insight intact. Residual obsesssions manageable. No auditory or visual hallucinations. No delusions.  Markedly more anxious and depressed and rumination. Pacing.     Lab Review:     Component Value Date/Time   NA 141 01/20/2020 1445   K 4.1 01/20/2020 1445   CL 106 01/20/2020 1445   CO2 27 01/20/2020 1445   GLUCOSE 107 (H) 01/20/2020 1445   BUN 10 01/20/2020 1445   CREATININE 0.80 01/20/2020 1445   CALCIUM 9.6 01/20/2020 1445   PROT 6.4 01/20/2020  1445   ALBUMIN 4.0 05/13/2017 1510   AST 9 (L) 01/20/2020 1445   ALT 14 01/20/2020 1445   ALKPHOS 61 05/13/2017 1510   BILITOT 0.6 01/20/2020 1445   GFRNONAA 74 01/20/2020 1445   GFRAA 85 01/20/2020 1445       Component Value Date/Time   WBC 6.7 01/20/2020 1445   RBC 4.75 01/20/2020 1445   HGB 14.0 01/20/2020 1445   HCT 41.9 01/20/2020 1445   PLT 235 01/20/2020 1445   MCV 88.2 01/20/2020 1445   MCH 29.5 01/20/2020 1445   MCHC 33.4 01/20/2020 1445   RDW 12.5 01/20/2020 1445   LYMPHSABS 2,459 01/20/2020 1445   MONOABS 0.8 12/18/2018 1628   EOSABS 181 01/20/2020 1445   BASOSABS 40 01/20/2020 1445    No results found for: POCLITH, LITHIUM   No results found for: PHENYTOIN, PHENOBARB, VALPROATE, CBMZ   .res Assessment: Plan:    Generalized anxiety disorder - Plan: clonazePAM (KLONOPIN) 0.5 MG tablet  Mixed obsessional thoughts and acts  Insomnia due to mental condition  Major depressive disorder, recurrent episode, moderate (HCC)  Essential hypertension - Plan: atenolol (TENORMIN) 25 MG tablet  Tachycardia - Plan: atenolol (TENORMIN) 25 MG tablet    Marked unexpected and unprovoked worsening of anxiety and depresssion without obvious worsening of OCD.  Worsening insomnia.   Failure trazodone for sleep. Start quetiapine for TR anxiety and insomnia and rumination. Discussed potential metabolic side effects associated with atypical antipsychotics, as well as potential risk for movement side effects. Advised pt to contact office if movement side effects occur.   We discussed the short-term  risks associated with benzodiazepines including sedation and increased fall risk among others.  Discussed long-term side effect risk including dependence, potential withdrawal symptoms, and the potential eventual dose-related risk of dementia. She accepts the risk bc feels she must have the clonazepam and clomipramine.  Disc cardiac risks of the clomipramine.  Her cognition is very good.   Insomnia managed.  Supportive therapy on relapse prevention.  She feels it is manageable.  Overall OCD is managed fairly well with low dose clomipramine.  Consider repeat blood level if she ever gets SE.  PT work also helps bc it's a distraction.  Disc CBT techniques to fight the obsessions that remain and occasionally recur.  Enc exercise.  Increase atenolol for anxiety and tachycardia to 25 mg daily.  DC trazodone Seroquel 25-50 mg for sleep  Needs increased BZ .  She is afraid increasing the clonazepam but may be willing to take Xanax during the day.   Stay out of work this week.  Disc option of Day treatment bc severity of sx and she's afraid of being alone. Option call [facility] at the main [facility] phone.  Tell them I referred you to the Day treatment program.  This appt was 30 mins.  FU 4-5 days  Lynder Parents, MD, DFAPA   Please see After Visit Summary for patient specific instructions.  Future Appointments  Date Time Provider [place]  05/02/2020 11:30 AM Garnet Sierras, NP [facility] None  05/18/2020  4:30 PM Cottle, Billey Co., MD [facility] None  08/15/2020  3:00 PM Unk Pinto, MD [facility] None    No orders of the defined types were placed in this encounter.     -------------------------------

## 2020-04-14 ENCOUNTER — Encounter: Payer: Self-pay | Admitting: Psychiatry

## 2020-04-14 ENCOUNTER — Ambulatory Visit (INDEPENDENT_AMBULATORY_CARE_PROVIDER_SITE_OTHER): Payer: Medicare PPO | Admitting: Psychiatry

## 2020-04-14 ENCOUNTER — Other Ambulatory Visit: Payer: Self-pay

## 2020-04-14 DIAGNOSIS — F5105 Insomnia due to other mental disorder: Secondary | ICD-10-CM

## 2020-04-14 DIAGNOSIS — F331 Major depressive disorder, recurrent, moderate: Secondary | ICD-10-CM | POA: Diagnosis not present

## 2020-04-14 DIAGNOSIS — F422 Mixed obsessional thoughts and acts: Secondary | ICD-10-CM | POA: Diagnosis not present

## 2020-04-14 DIAGNOSIS — F411 Generalized anxiety disorder: Secondary | ICD-10-CM | POA: Diagnosis not present

## 2020-04-14 MED ORDER — CLOMIPRAMINE HCL 25 MG PO CAPS: ORAL_CAPSULE | ORAL | 0 refills | Status: DC

## 2020-04-14 NOTE — Patient Instructions (Addendum)
Continue Klonopin 2 mg HS Increase Seroquel 75 mg for sleep and anxiety Start clomipramine 25 mg 1 capsule for 10 days, then 2 each night or 50 mg nightly.

## 2020-04-14 NOTE — Progress Notes (Signed)
Donna Dillon [id number] [birth year] 74 y.o.  Subjective:   Patient ID:  Donna Dillon is a 74 y.o. (DOB May 03, 1946) female.  Chief Complaint:  Chief Complaint  Patient presents with  . Anxiety  . Sleeping Problem  . Follow-up    HPI Donna Dillon presents to the office today for follow-up of OCD.    seen December 2020.  No meds were changed.  Last seen Apr 10, 2020.  The following is noted: She is markedly worse. "falling apart".  More fatigued since dilation of esophagus.  January more dry eyes.  Hair falling out.  TSH is normal.   More sleep problems since Feb. Called and increased clonazepam to 2 mg HS bc also the insomnia was causing more anxiety.  Now also scared of taking the clonazepam.   Added trazodone 100 mg and it didn't help sleep. More obsessed with heart rate being elevated to 100 bpm.   Can't work DT anxiety and insomnia and fear over her heart. Scared.  She was ruminating. Plan:Increase atenolol for anxiety and tachycardia to 25 mg daily. DC trazodone Seroquel 25-50 mg for sleep Needs increased BZ .  She is afraid increasing the clonazepam but may be willing to take Xanax during the day.  Stay out of work this week. Disc option of Day treatment bc severity of sx and she's afraid of being alone. Option call Behavioral Health Assessment at the main [facility] phone.  Tell them I referred you to the Day treatment program. Return to office in 4 days.  04/14/2020 appointment the following is noted: Took 37.5 mg Seroquel Monday with 2 mg Klonopin and awoke in 3 hour. Afterwards usually took Seroquel 50 mg since then with Klonopin.  Lost another 1# and total of 11#/3 weeks.  Eating after taking alprazolam.  Can't quilt or read or focus on TV.  Heart rate is better after increase atenolol to 25 mg daily. Now 5-6 hours of sleep. [name].  Long hx OCD which was severe and failed multiple SSRI but responded best to  clomipramine and has maintained benefit from the med.  OCD generally controlled except when very tired.  Can sometimes reason her way out of the OCD, obsessions which she could not do before the meds.  No SE from meds.  Cog is intact.  Still working PT for mental health reasons.    Thinks the benefit of clonazepam is worth the cognitive risks.  Medicallly necessary.  Exercise 7 days a week.    Son [name] Cancer doing ok in computers. 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  Surprisingly well. Still has compulsions including night eating.  Married since 2016.   Hasn't travelled lately but wants to.  Enjoys [place]. [place].  Has a dog.  Past Psychiatric Medication Trials:  Clomipramine,  Poor response SSRIs  Trazodone 100 NR   Review of Systems:  Review of Systems  Constitutional: Positive for appetite change and unexpected weight change.       Lost 10-12 # in 3 weeks  Respiratory: Negative for shortness of breath.   Gastrointestinal: Positive for abdominal pain.  Neurological: Negative for tremors and weakness.  Psychiatric/Behavioral: Negative for agitation, behavioral problems, confusion, decreased concentration, dysphoric mood, hallucinations, self-injury, sleep disturbance and suicidal ideas. The patient is nervous/anxious. The patient is not hyperactive.     Medications: I have reviewed the patient's current medications.  Current Outpatient Medications  Medication Sig Dispense Refill  .  ALPRAZolam (XANAX) 1 MG tablet Take 0.5 mg by mouth 2 (two) times daily as needed for anxiety.    Marland Kitchen atenolol (TENORMIN) 25 MG tablet Take 1 tablet Daily for BP 90 tablet 3  . Calcium 1500 MG tablet Take 1,500 mg by mouth daily.     . clonazePAM (KLONOPIN) 0.5 MG tablet Take 1 tablet (0.5 mg total) by mouth 2 (two) times daily as needed for anxiety. 60 tablet 0  . clonazePAM (KLONOPIN) 2 MG tablet Take 1 tablet (2 mg total) by mouth at bedtime. 90 tablet 1  . ezetimibe (ZETIA) 10 MG tablet Take 1 tablet  Daily for Cholesterol 90 tablet 3  . fish oil-omega-3 fatty acids 1000 MG capsule Take 1 g by mouth daily.    . magnesium 30 MG tablet Take 30 mg by mouth daily.     . Multiple Vitamin (MULTIVITAMIN) tablet Take 1 tablet by mouth daily.    . Multiple Vitamins-Minerals (EYE VITAMINS PO) Take 1 capsule by mouth daily. Ocusight Vitamins    . omeprazole (PRILOSEC) 40 MG capsule Take 1 capsule (40 mg total) by mouth daily. 30 capsule 11  . QUEtiapine (SEROQUEL) 25 MG tablet Take 1-2 tablets (25-50 mg total) by mouth at bedtime. 60 tablet 0  . vitamin C (ASCORBIC ACID) 500 MG tablet Take 500 mg by mouth daily.    Marland Kitchen VITAMIN D PO Take 5,000 Units by mouth. Takes 1 to 2 capsules daily.    Marland Kitchen zinc sulfate (ZINC-220) 220 (50 Zn) MG capsule Take 220 mg by mouth daily.    . clomiPRAMINE (ANAFRANIL) 25 MG capsule 1 at night for 10 days then 2 each night 60 capsule 0   No current facility-administered medications for this visit.    Medication Side Effects: None  Allergies:  Allergies  Allergen Reactions  . Prednisone     Altered mental state  . Penicillins   . Asa [Aspirin] Diarrhea  . Levaquin [Levofloxacin] Other (See Comments)    Joint Pain  . Macrolides And Ketolides Nausea And Vomiting  . Paxil [Paroxetine Hcl] Nausea And Vomiting  . Tetracyclines & Related Nausea And Vomiting  . Zoloft [Sertraline Hcl] Nausea And Vomiting  . Sulfa Antibiotics Rash  . Trimethoprim Rash    Past Medical History:  Diagnosis Date  . Anemia    mild  . Anxiety   . Colon polyp    hyperplastic  . Diverticulosis   . Esophageal stricture   . GERD (gastroesophageal reflux disease)    no s/s now- off medicines  . Hyperlipidemia   . Palpitations   . Prediabetes   . Tachycardia     Family History  Problem Relation Age of Onset  . Kidney cancer Father   . Hypertension Mother   . Heart disease Mother   . Colon cancer Paternal Aunt   . Colon polyps Neg Hx   . Esophageal cancer Neg Hx   . Rectal cancer  Neg Hx   . Stomach cancer Neg Hx     Social History   Socioeconomic History  . Marital status: Married    Spouse name: Not on file  . Number of children: 1  . Years of education: Not on file  . Highest education level: Not on file  Occupational History  . Occupation: Librarian  Tobacco Use  . Smoking status: Never Smoker  . Smokeless tobacco: Never Used  Substance and Sexual Activity  . Alcohol use: No    Alcohol/week: 0.0 standard drinks  .  Drug use: No  . Sexual activity: Not on file  Other Topics Concern  . Not on file  Social History Narrative  . Not on file   Social Determinants of Health   Financial Resource Strain:   . Difficulty of Paying Living Expenses:   Food Insecurity:   . Worried About Charity fundraiser in the Last Year:   . Arboriculturist in the Last Year:   Transportation Needs:   . Film/video editor (Medical):   Marland Kitchen Lack of Transportation (Non-Medical):   Physical Activity:   . Days of Exercise per Week:   . Minutes of Exercise per Session:   Stress:   . Feeling of Stress :   Social Connections:   . Frequency of Communication with Friends and Family:   . Frequency of Social Gatherings with Friends and Family:   . Attends Religious Services:   . Active Member of Clubs or Organizations:   . Attends Archivist Meetings:   Marland Kitchen Marital Status:   Intimate Partner Violence:   . Fear of Current or Ex-Partner:   . Emotionally Abused:   Marland Kitchen Physically Abused:   . Sexually Abused:     Past Medical History, Surgical history, Social history, and Family history were reviewed and updated as appropriate.   Son has severe anxiety and is treated also.  Please see review of systems for further details on the patient's review from today.   Objective:   Physical Exam:  LMP 12/09/1994   Physical Exam Constitutional:      General: She is not in acute distress.    Appearance: She is well-developed.  Musculoskeletal:        General: No  deformity.  Neurological:     Mental Status: She is alert and oriented to person, place, and time.     Motor: No tremor.     Coordination: Coordination normal.     Gait: Gait normal.  Psychiatric:        Attention and Perception: She is attentive.        Mood and Affect: Mood is anxious and depressed. Affect is tearful. Affect is not labile, blunt, angry or inappropriate.        Speech: Speech normal.        Behavior: Behavior normal.        Thought Content: Thought content normal. Thought content does not include homicidal or suicidal ideation. Thought content does not include homicidal or suicidal plan.        Cognition and Memory: Cognition normal.        Judgment: Judgment normal.     Comments: Insight intact. Residual obsesssions.. No auditory or visual hallucinations. No delusions.  Markedly more anxious and depressed and still rumination. Ruminating more than obsessing      Lab Review:     Component Value Date/Time   NA 141 01/20/2020 1445   K 4.1 01/20/2020 1445   CL 106 01/20/2020 1445   CO2 27 01/20/2020 1445   GLUCOSE 107 (H) 01/20/2020 1445   BUN 10 01/20/2020 1445   CREATININE 0.80 01/20/2020 1445   CALCIUM 9.6 01/20/2020 1445   PROT 6.4 01/20/2020 1445   ALBUMIN 4.0 05/13/2017 1510   AST 9 (L) 01/20/2020 1445   ALT 14 01/20/2020 1445   ALKPHOS 61 05/13/2017 1510   BILITOT 0.6 01/20/2020 1445   GFRNONAA 74 01/20/2020 1445   GFRAA 85 01/20/2020 1445       Component Value Date/Time  WBC 6.7 01/20/2020 1445   RBC 4.75 01/20/2020 1445   HGB 14.0 01/20/2020 1445   HCT 41.9 01/20/2020 1445   PLT 235 01/20/2020 1445   MCV 88.2 01/20/2020 1445   MCH 29.5 01/20/2020 1445   MCHC 33.4 01/20/2020 1445   RDW 12.5 01/20/2020 1445   LYMPHSABS 2,459 01/20/2020 1445   MONOABS 0.8 12/18/2018 1628   EOSABS 181 01/20/2020 1445   BASOSABS 40 01/20/2020 1445    No results found for: POCLITH, LITHIUM   No results found for: PHENYTOIN, PHENOBARB, VALPROATE, CBMZ    .res Assessment: Plan:    Generalized anxiety disorder  Major depressive disorder, recurrent episode, moderate (HCC)  Insomnia due to mental condition  Mixed obsessional thoughts and acts  Depression - Plan: clomiPRAMINE (ANAFRANIL) 25 MG capsule    Marked  worsening of anxiety and depresssion without obvious worsening of OCD.  Worsening insomnia.  NOW REVEALED DT STOPPING CLOMIPRAMINE ACCIDENTALLY A FEW MONTHS AGO.  Discussed potential metabolic side effects associated with atypical antipsychotics, as well as potential risk for movement side effects. Advised pt to contact office if movement side effects occur.   We discussed the short-term risks associated with benzodiazepines including sedation and increased fall risk among others.  Discussed long-term side effect risk including dependence, potential withdrawal symptoms, and the potential eventual dose-related risk of dementia.  But recent studies from 2020 dispute this association between benzodiazepines and dementia risk. Newer studies in 2020 do not support an association with dementia.  Supportive therapy on relapse prevention.  She feels it is manageable.  Enc exercise.  Start clomipramine 25 mg 1 capsule for 10 days, then 2 each night or 50 mg nightly. Continue Klonopin 2 mg HS Increase Seroquel 75 mg for sleep and anxiety and rumination  Needs increased BZ .  She is afraid increasing the clonazepam but may be willing to take Xanax during the day.  Ok continue Xanax daytime for anxiety and allow her to eat..   Stay out of work right now.  Is on medical leave.  Disc option of Day treatment bc severity of sx and she's afraid of being alone. Option call Behavioral Health Assessment at the main [facility] phone.  Tell them I referred you to the Day treatment program.  This appt was 30 mins.  FU 1 week  Lynder Parents, MD, DFAPA   Please see After Visit Summary for patient specific instructions.  Future Appointments  Date  Time Provider [place]  05/02/2020 11:30 AM Garnet Sierras, NP [facility] None  05/18/2020  4:30 PM Cottle, Billey Co., MD [facility] None  08/15/2020  3:00 PM Unk Pinto, MD [facility] None    No orders of the defined types were placed in this encounter.     -------------------------------

## 2020-04-21 ENCOUNTER — Encounter: Payer: Self-pay | Admitting: Psychiatry

## 2020-04-21 ENCOUNTER — Other Ambulatory Visit: Payer: Self-pay

## 2020-04-21 ENCOUNTER — Ambulatory Visit (INDEPENDENT_AMBULATORY_CARE_PROVIDER_SITE_OTHER): Payer: Medicare PPO | Admitting: Psychiatry

## 2020-04-21 DIAGNOSIS — F331 Major depressive disorder, recurrent, moderate: Secondary | ICD-10-CM

## 2020-04-21 DIAGNOSIS — F5105 Insomnia due to other mental disorder: Secondary | ICD-10-CM | POA: Diagnosis not present

## 2020-04-21 DIAGNOSIS — F411 Generalized anxiety disorder: Secondary | ICD-10-CM

## 2020-04-21 DIAGNOSIS — F422 Mixed obsessional thoughts and acts: Secondary | ICD-10-CM | POA: Diagnosis not present

## 2020-04-21 NOTE — Patient Instructions (Signed)
Disc option of Day treatment bc severity of sx and she's afraid of being alone. Option call Behavioral Health Assessment at the main [facility] phone.  Tell them I referred you to the Day treatment program.

## 2020-04-21 NOTE — Progress Notes (Signed)
Donna Dillon [id number] [birth year] 74 y.o.  Subjective:   Patient ID:  Donna Dillon is a 74 y.o. (DOB [birth year]) female.  Chief Complaint:  Chief Complaint  Patient presents with  . Follow-up  . Anxiety  . Depression    HPI Donna Dillon presents to the office today for follow-up of OCD.    seen December 2020.  No meds were changed.  Last seen Apr 10, 2020.  The following is noted: She is markedly worse. "falling apart".  More fatigued since dilation of esophagus.  January more dry eyes.  Hair falling out.  TSH is normal.   More sleep problems since Feb. Called and increased clonazepam to 2 mg HS bc also the insomnia was causing more anxiety.  Now also scared of taking the clonazepam.   Added trazodone 100 mg and it didn't help sleep. More obsessed with heart rate being elevated to 100 bpm.   Can't work DT anxiety and insomnia and fear over her heart. Scared.  She was ruminating. Plan:Increase atenolol for anxiety and tachycardia to 25 mg daily. DC trazodone Seroquel 25-50 mg for sleep Needs increased BZ .  She is afraid increasing the clonazepam but may be willing to take Xanax during the day.  Stay out of work this week. Disc option of Day treatment bc severity of sx and she's afraid of being alone. Option call Behavioral Health Assessment at the main [facility] phone.  Tell them I referred you to the Day treatment program. Return to office in 4 days.  04/14/2020 appointment the following is noted: Took 37.5 mg Seroquel Monday with 2 mg Klonopin and awoke in 3 hour. Afterwards usually took Seroquel 50 mg since then with Klonopin.  Lost another 1# and total of 11#/3 weeks.  Eating after taking alprazolam.  Can't quilt or read or focus on TV.  Heart rate is better after increase atenolol to 25 mg daily. Now 5-6 hours of sleep. [name]. Plan: Start clomipramine 25 mg 1 capsule for 10 days, then 2 each night or 50 mg  nightly. Continue Klonopin 2 mg HS Increase Seroquel 75 mg for sleep and anxiety and rumination Needs increased BZ .  She is afraid increasing the clonazepam but may be willing to take Xanax during the day.  Ok continue Xanax daytime for anxiety and allow her to eat..  Stay out of work right now.  Is on medical leave. Disc option of Day treatment bc severity of sx and she's afraid of being alone. Option call Behavioral Health Assessment at the main [facility] phone.  Tell them I referred you to the Day treatment program.  04/21/2020 urgent appointment, the following is noted: Generally sleeping better with quetiapine 75 mg HS.  Getting hangover.  Has to take Xanax to eat.  Losing weight 10#.  Just wants to lay down.  Feels shakey and weak. Did get restarted on clomipramine 25 mg HS.  Fatigue and hard to concentrate.    Long hx OCD which was severe and failed multiple SSRI but responded best to clomipramine and has maintained benefit from the med.  OCD generally controlled except when very tired.  Can sometimes reason her way out of the OCD, obsessions which she could not do before the meds.  No SE from meds.  Cog is intact.  Still working PT for mental health reasons.    Son [name] Cancer doing ok in computers. 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  Surprisingly well. Still has compulsions including night eating.  Married since 2016.   Hasn't travelled lately but wants to.  Enjoys [place]. [place].  Has a dog.  Past Psychiatric Medication Trials:  Clomipramine,  Poor response SSRIs  Trazodone 100 NR   Review of Systems:  Review of Systems  Constitutional: Positive for appetite change and unexpected weight change.       Lost 10-12 # in 3 weeks  Respiratory: Negative for shortness of breath.   Gastrointestinal: Positive for abdominal pain.  Neurological: Negative for tremors and weakness.  Psychiatric/Behavioral: Negative for agitation, behavioral problems, confusion, decreased concentration,  dysphoric mood, hallucinations, self-injury, sleep disturbance and suicidal ideas. The patient is nervous/anxious. The patient is not hyperactive.     Medications: I have reviewed the patient's current medications.  Current Outpatient Medications  Medication Sig Dispense Refill  . ALPRAZolam (XANAX) 1 MG tablet Take 0.25 mg by mouth 2 (two) times daily as needed for anxiety.     Marland Kitchen atenolol (TENORMIN) 25 MG tablet Take 1 tablet Daily for BP 90 tablet 3  . Calcium 1500 MG tablet Take 1,500 mg by mouth daily.     . clomiPRAMINE (ANAFRANIL) 25 MG capsule 1 at night for 10 days then 2 each night (Patient taking differently: Take 25 mg by mouth daily. 1 at night for 10 days then 2 each night) 60 capsule 0  . clonazePAM (KLONOPIN) 2 MG tablet Take 1 tablet (2 mg total) by mouth at bedtime. 90 tablet 1  . ezetimibe (ZETIA) 10 MG tablet Take 1 tablet Daily for Cholesterol 90 tablet 3  . fish oil-omega-3 fatty acids 1000 MG capsule Take 1 g by mouth daily.    . magnesium 30 MG tablet Take 30 mg by mouth daily.     . Multiple Vitamin (MULTIVITAMIN) tablet Take 1 tablet by mouth daily.    . Multiple Vitamins-Minerals (EYE VITAMINS PO) Take 1 capsule by mouth daily. Ocusight Vitamins    . omeprazole (PRILOSEC) 40 MG capsule Take 1 capsule (40 mg total) by mouth daily. 30 capsule 11  . QUEtiapine (SEROQUEL) 25 MG tablet Take 1-2 tablets (25-50 mg total) by mouth at bedtime. (Patient taking differently: Take 75 mg by mouth at bedtime. ) 60 tablet 0  . vitamin C (ASCORBIC ACID) 500 MG tablet Take 500 mg by mouth daily.    Marland Kitchen VITAMIN D PO Take 5,000 Units by mouth. Takes 1 to 2 capsules daily.    Marland Kitchen zinc sulfate (ZINC-220) 220 (50 Zn) MG capsule Take 220 mg by mouth daily.    . clonazePAM (KLONOPIN) 0.5 MG tablet Take 1 tablet (0.5 mg total) by mouth 2 (two) times daily as needed for anxiety. (Patient not taking: Reported on 04/21/2020) 60 tablet 0   No current facility-administered medications for this visit.     Medication Side Effects: None  Allergies:  Allergies  Allergen Reactions  . Prednisone     Altered mental state  . Penicillins   . Asa [Aspirin] Diarrhea  . Levaquin [Levofloxacin] Other (See Comments)    Joint Pain  . Macrolides And Ketolides Nausea And Vomiting  . Paxil [Paroxetine Hcl] Nausea And Vomiting  . Tetracyclines & Related Nausea And Vomiting  . Zoloft [Sertraline Hcl] Nausea And Vomiting  . Sulfa Antibiotics Rash  . Trimethoprim Rash    Past Medical History:  Diagnosis Date  . Anemia    mild  . Anxiety   . Colon polyp    hyperplastic  .  Diverticulosis   . Esophageal stricture   . GERD (gastroesophageal reflux disease)    no s/s now- off medicines  . Hyperlipidemia   . Palpitations   . Prediabetes   . Tachycardia     Family History  Problem Relation Age of Onset  . Kidney cancer Father   . Hypertension Mother   . Heart disease Mother   . Colon cancer Paternal Aunt   . Colon polyps Neg Hx   . Esophageal cancer Neg Hx   . Rectal cancer Neg Hx   . Stomach cancer Neg Hx     Social History   Socioeconomic History  . Marital status: Married    Spouse name: Not on file  . Number of children: 1  . Years of education: Not on file  . Highest education level: Not on file  Occupational History  . Occupation: Librarian  Tobacco Use  . Smoking status: Never Smoker  . Smokeless tobacco: Never Used  Substance and Sexual Activity  . Alcohol use: No    Alcohol/week: 0.0 standard drinks  . Drug use: No  . Sexual activity: Not on file  Other Topics Concern  . Not on file  Social History Narrative  . Not on file   Social Determinants of Health   Financial Resource Strain:   . Difficulty of Paying Living Expenses:   Food Insecurity:   . Worried About Charity fundraiser in the Last Year:   . Arboriculturist in the Last Year:   Transportation Needs:   . Film/video editor (Medical):   Marland Kitchen Lack of Transportation (Non-Medical):   Physical  Activity:   . Days of Exercise per Week:   . Minutes of Exercise per Session:   Stress:   . Feeling of Stress :   Social Connections:   . Frequency of Communication with Friends and Family:   . Frequency of Social Gatherings with Friends and Family:   . Attends Religious Services:   . Active Member of Clubs or Organizations:   . Attends Archivist Meetings:   Marland Kitchen Marital Status:   Intimate Partner Violence:   . Fear of Current or Ex-Partner:   . Emotionally Abused:   Marland Kitchen Physically Abused:   . Sexually Abused:     Past Medical History, Surgical history, Social history, and Family history were reviewed and updated as appropriate.   Son has severe anxiety and is treated also.  Please see review of systems for further details on the patient's review from today.   Objective:   Physical Exam:  LMP 12/09/1994   Physical Exam Constitutional:      General: She is not in acute distress.    Appearance: She is well-developed.  Musculoskeletal:        General: No deformity.  Neurological:     Mental Status: She is alert and oriented to person, place, and time.     Motor: No tremor.     Coordination: Coordination normal.     Gait: Gait normal.  Psychiatric:        Attention and Perception: She is attentive.        Mood and Affect: Mood is anxious and depressed. Affect is not labile, blunt, angry, tearful or inappropriate.        Speech: Speech normal.        Behavior: Behavior normal.        Thought Content: Thought content normal. Thought content does not include homicidal or suicidal ideation.  Thought content does not include homicidal or suicidal plan.        Cognition and Memory: Cognition normal.        Judgment: Judgment normal.     Comments: Insight intact. Residual obsesssions.. No auditory or visual hallucinations. No delusions.  Markedly more anxious and depressed and still rumination.  Less desperate than last time but still feels vulnerable. Ruminating more  than obsessing  Neat.      Lab Review:     Component Value Date/Time   NA 141 01/20/2020 1445   K 4.1 01/20/2020 1445   CL 106 01/20/2020 1445   CO2 27 01/20/2020 1445   GLUCOSE 107 (H) 01/20/2020 1445   BUN 10 01/20/2020 1445   CREATININE 0.80 01/20/2020 1445   CALCIUM 9.6 01/20/2020 1445   PROT 6.4 01/20/2020 1445   ALBUMIN 4.0 05/13/2017 1510   AST 9 (L) 01/20/2020 1445   ALT 14 01/20/2020 1445   ALKPHOS 61 05/13/2017 1510   BILITOT 0.6 01/20/2020 1445   GFRNONAA 74 01/20/2020 1445   GFRAA 85 01/20/2020 1445       Component Value Date/Time   WBC 6.7 01/20/2020 1445   RBC 4.75 01/20/2020 1445   HGB 14.0 01/20/2020 1445   HCT 41.9 01/20/2020 1445   PLT 235 01/20/2020 1445   MCV 88.2 01/20/2020 1445   MCH 29.5 01/20/2020 1445   MCHC 33.4 01/20/2020 1445   RDW 12.5 01/20/2020 1445   LYMPHSABS 2,459 01/20/2020 1445   MONOABS 0.8 12/18/2018 1628   EOSABS 181 01/20/2020 1445   BASOSABS 40 01/20/2020 1445    No results found for: POCLITH, LITHIUM   No results found for: PHENYTOIN, PHENOBARB, VALPROATE, CBMZ   .res Assessment: Plan:    Major depressive disorder, recurrent episode, moderate (HCC)  Generalized anxiety disorder  Mixed obsessional thoughts and acts  Insomnia due to mental condition    Marked  worsening of anxiety and depresssion without obvious worsening of OCD.  Worsening insomnia.  NOW REVEALED DT STOPPING CLOMIPRAMINE ACCIDENTALLY A FEW MONTHS AGO.  Sleeping better with meds and is able to eat with Xanax.  Discussed potential metabolic side effects associated with atypical antipsychotics, as well as potential risk for movement side effects. Advised pt to contact office if movement side effects occur.   We discussed the short-term risks associated with benzodiazepines including sedation and increased fall risk among others.  Discussed long-term side effect risk including dependence, potential withdrawal symptoms, and the potential eventual  dose-related risk of dementia.  But recent studies from 2020 dispute this association between benzodiazepines and dementia risk. Newer studies in 2020 do not support an association with dementia.  Supportive therapy on relapse prevention.  She feels it is manageable.  Enc exercise.  Continue clomipramine 25 mg 1 capsule for 10 days, then 2 each night or 50 mg nightly. Continue Klonopin 2 mg HS Increase Seroquel 75 mg for sleep and anxiety and rumination  Ok continue Xanax daytime for anxiety and allow her to eat..  Meds won't work if you don't get enough protein.  No other med changes today.  Stay out of work right now.  Is on medical leave.  Need to continue LOA for awhile.  It will take some time for clomipramine to work again. Disc her fear of losing her job DT illness.  Continue LOA until May 29, 2020.  Disc option of Day treatment bc severity of sx and she's afraid of being alone. Option call Behavioral Health Assessment at the  main [facility] phone.  Tell them I referred you to the Day treatment program.  This appt was 30 mins.  FU 2 week   Lynder Parents, MD, DFAPA   Please see After Visit Summary for patient specific instructions.  Future Appointments  Date Time Provider [place]  05/02/2020 11:30 AM Garnet Sierras, NP [facility] None  05/18/2020  4:30 PM Cottle, Billey Co., MD [facility] None  08/15/2020  3:00 PM Unk Pinto, MD [facility] None    No orders of the defined types were placed in this encounter.     -------------------------------

## 2020-04-27 ENCOUNTER — Other Ambulatory Visit: Payer: Self-pay | Admitting: Psychiatry

## 2020-04-27 DIAGNOSIS — F5105 Insomnia due to other mental disorder: Secondary | ICD-10-CM

## 2020-04-27 DIAGNOSIS — F331 Major depressive disorder, recurrent, moderate: Secondary | ICD-10-CM

## 2020-04-27 DIAGNOSIS — F411 Generalized anxiety disorder: Secondary | ICD-10-CM

## 2020-05-02 ENCOUNTER — Ambulatory Visit: Payer: Medicare PPO | Admitting: Adult Health Nurse Practitioner

## 2020-05-03 ENCOUNTER — Other Ambulatory Visit: Payer: Self-pay

## 2020-05-03 ENCOUNTER — Encounter: Payer: Self-pay | Admitting: Psychiatry

## 2020-05-03 ENCOUNTER — Ambulatory Visit (INDEPENDENT_AMBULATORY_CARE_PROVIDER_SITE_OTHER): Payer: Medicare PPO | Admitting: Psychiatry

## 2020-05-03 DIAGNOSIS — F411 Generalized anxiety disorder: Secondary | ICD-10-CM

## 2020-05-03 DIAGNOSIS — F422 Mixed obsessional thoughts and acts: Secondary | ICD-10-CM | POA: Diagnosis not present

## 2020-05-03 DIAGNOSIS — F5105 Insomnia due to other mental disorder: Secondary | ICD-10-CM

## 2020-05-03 DIAGNOSIS — F331 Major depressive disorder, recurrent, moderate: Secondary | ICD-10-CM

## 2020-05-03 NOTE — Progress Notes (Signed)
PATRICA Dillon [id number] [birth year] 74 y.o.  Subjective:   Patient ID:  Donna Dillon is a 74 y.o. (DOB Sep 28, 1946) female.  Chief Complaint:  Chief Complaint  Patient presents with  . Follow-up  . Anxiety  . Sleeping Problem    HPI ERICHA Dillon presents to the office today for follow-up of OCD.    seen December 2020.  No meds were changed.  Last seen Apr 10, 2020.  The following is noted: She is markedly worse. "falling apart".  More fatigued since dilation of esophagus.  January more dry eyes.  Hair falling out.  TSH is normal.   More sleep problems since Feb. Called and increased clonazepam to 2 mg HS bc also the insomnia was causing more anxiety.  Now also scared of taking the clonazepam.   Added trazodone 100 mg and it didn't help sleep. More obsessed with heart rate being elevated to 100 bpm.   Can't work DT anxiety and insomnia and fear over her heart. Scared.  She was ruminating. Plan:Increase atenolol for anxiety and tachycardia to 25 mg daily. DC trazodone Seroquel 25-50 mg for sleep Needs increased BZ .  She is afraid increasing the clonazepam but may be willing to take Xanax during the day.  Stay out of work this week. Disc option of Day treatment bc severity of sx and she's afraid of being alone. Option call Behavioral Health Assessment at the main [facility] phone.  Tell them I referred you to the Day treatment program. Return to office in 4 days.  04/14/2020 appointment the following is noted: Took 37.5 mg Seroquel Monday with 2 mg Klonopin and awoke in 3 hour. Afterwards usually took Seroquel 50 mg since then with Klonopin.  Lost another 1# and total of 11#/3 weeks.  Eating after taking alprazolam.  Can't quilt or read or focus on TV.  Heart rate is better after increase atenolol to 25 mg daily. Now 5-6 hours of sleep. [name]. Plan: Start clomipramine 25 mg 1 capsule for 10 days, then 2 each night or 50  mg nightly. Continue Klonopin 2 mg HS Increase Seroquel 75 mg for sleep and anxiety and rumination Needs increased BZ .  She is afraid increasing the clonazepam but may be willing to take Xanax during the day.  Ok continue Xanax daytime for anxiety and allow her to eat..  Stay out of work right now.  Is on medical leave. Disc option of Day treatment bc severity of sx and she's afraid of being alone. Option call Behavioral Health Assessment at the main [facility] phone.  Tell them I referred you to the Day treatment program.  04/21/2020 urgent appointment, the following is noted: Generally sleeping better with quetiapine 75 mg HS.  Getting hangover.  Has to take Xanax to eat.  Losing weight 10#.  Just wants to lay down.  Feels shakey and weak. Did get restarted on clomipramine 25 mg HS.  Fatigue and hard to concentrate.   Plan: Continue medical leave until May 29, 2020.   The following med decisions were made: Continue clomipramine 25 mg 1 capsule for 10 days, then 2 each night or 50 mg nightly. Continue Klonopin 2 mg HS Increase Seroquel 75 mg for sleep and anxiety and rumination  05/03/2020 appointment urgently with the following noted:  Doesn't feel better yet.  Seroquel 75 gave her hangover.  With 50 mg usually 8 hours but some EMA esp if anxiety is bad.  Lays in bed until noon bc doesn't want to do anything  In bed to escape. When first started clomipramine 25 had sig insomnia.  At 50 mg now Appetite still poor and lost 10#.  Forcing food for 6 weeks. Idleness is not good for worry but can't focus well enough to read as much as she would usually otherwise bc anxiety interferes. Taking alprazolam 0.5 mg in pm to help eat dinner.  Getting from [place].  Son Donna Dillon Cancer doing ok in computers. 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  Surprisingly well. Still has compulsions including night eating.  Married since 2016.   Hasn't travelled lately but wants to.  Enjoys [place]. [place].  Has a  dog.  Past Psychiatric Medication Trials:  Clomipramine,  Poor response SSRIs  Trazodone 100 NR Seroquel 75 hangover  Review of Systems:  Review of Systems  Constitutional: Positive for appetite change and unexpected weight change.       Lost 10-12 # in 3 weeks  Respiratory: Negative for shortness of breath.   Gastrointestinal: Positive for abdominal pain.  Neurological: Negative for dizziness, tremors, weakness and headaches.  Psychiatric/Behavioral: Negative for agitation, behavioral problems, confusion, decreased concentration, dysphoric mood, hallucinations, self-injury, sleep disturbance and suicidal ideas. The patient is nervous/anxious. The patient is not hyperactive.     Medications: I have reviewed the patient's current medications.  Current Outpatient Medications  Medication Sig Dispense Refill  . ALPRAZolam (XANAX) 1 MG tablet Take 0.25 mg by mouth 2 (two) times daily as needed for anxiety.     Marland Kitchen atenolol (TENORMIN) 25 MG tablet Take 1 tablet Daily for BP 90 tablet 3  . Calcium 1500 MG tablet Take 1,500 mg by mouth daily.     . clomiPRAMINE (ANAFRANIL) 25 MG capsule 1 at night for 10 days then 2 each night (Patient taking differently: Take 50 mg by mouth daily. ) 60 capsule 0  . clonazePAM (KLONOPIN) 0.5 MG tablet Take 1 tablet (0.5 mg total) by mouth 2 (two) times daily as needed for anxiety. 60 tablet 0  . clonazePAM (KLONOPIN) 2 MG tablet Take 1 tablet (2 mg total) by mouth at bedtime. 90 tablet 1  . ezetimibe (ZETIA) 10 MG tablet Take 1 tablet Daily for Cholesterol 90 tablet 3  . fish oil-omega-3 fatty acids 1000 MG capsule Take 1 g by mouth daily.    . magnesium 30 MG tablet Take 30 mg by mouth daily.     . Multiple Vitamin (MULTIVITAMIN) tablet Take 1 tablet by mouth daily.    . Multiple Vitamins-Minerals (EYE VITAMINS PO) Take 1 capsule by mouth daily. Ocusight Vitamins    . omeprazole (PRILOSEC) 40 MG capsule Take 1 capsule (40 mg total) by mouth daily. 30 capsule 11   . QUEtiapine (SEROQUEL) 25 MG tablet Take 3 tablets (75 mg total) by mouth at bedtime. (Patient taking differently: Take 50 mg by mouth at bedtime. ) 90 tablet 0  . vitamin C (ASCORBIC ACID) 500 MG tablet Take 500 mg by mouth daily.    Marland Kitchen VITAMIN D PO Take 5,000 Units by mouth. Takes 1 to 2 capsules daily.    Marland Kitchen zinc sulfate (ZINC-220) 220 (50 Zn) MG capsule Take 220 mg by mouth daily.     No current facility-administered medications for this visit.    Medication Side Effects: None  Allergies:  Allergies  Allergen Reactions  . Prednisone     Altered mental state  . Penicillins   . Asa [Aspirin] Diarrhea  .  Levaquin [Levofloxacin] Other (See Comments)    Joint Pain  . Macrolides And Ketolides Nausea And Vomiting  . Paxil [Paroxetine Hcl] Nausea And Vomiting  . Tetracyclines & Related Nausea And Vomiting  . Zoloft [Sertraline Hcl] Nausea And Vomiting  . Sulfa Antibiotics Rash  . Trimethoprim Rash    Past Medical History:  Diagnosis Date  . Anemia    mild  . Anxiety   . Colon polyp    hyperplastic  . Diverticulosis   . Esophageal stricture   . GERD (gastroesophageal reflux disease)    no s/s now- off medicines  . Hyperlipidemia   . Palpitations   . Prediabetes   . Tachycardia     Family History  Problem Relation Age of Onset  . Kidney cancer Father   . Hypertension Mother   . Heart disease Mother   . Colon cancer Paternal Aunt   . Colon polyps Neg Hx   . Esophageal cancer Neg Hx   . Rectal cancer Neg Hx   . Stomach cancer Neg Hx     Social History   Socioeconomic History  . Marital status: Married    Spouse name: Not on file  . Number of children: 1  . Years of education: Not on file  . Highest education level: Not on file  Occupational History  . Occupation: Librarian  Tobacco Use  . Smoking status: Never Smoker  . Smokeless tobacco: Never Used  Substance and Sexual Activity  . Alcohol use: No    Alcohol/week: 0.0 standard drinks  . Drug use: No   . Sexual activity: Not on file  Other Topics Concern  . Not on file  Social History Narrative  . Not on file   Social Determinants of Health   Financial Resource Strain:   . Difficulty of Paying Living Expenses:   Food Insecurity:   . Worried About Charity fundraiser in the Last Year:   . Arboriculturist in the Last Year:   Transportation Needs:   . Film/video editor (Medical):   Marland Kitchen Lack of Transportation (Non-Medical):   Physical Activity:   . Days of Exercise per Week:   . Minutes of Exercise per Session:   Stress:   . Feeling of Stress :   Social Connections:   . Frequency of Communication with Friends and Family:   . Frequency of Social Gatherings with Friends and Family:   . Attends Religious Services:   . Active Member of Clubs or Organizations:   . Attends Archivist Meetings:   Marland Kitchen Marital Status:   Intimate Partner Violence:   . Fear of Current or Ex-Partner:   . Emotionally Abused:   Marland Kitchen Physically Abused:   . Sexually Abused:     Past Medical History, Surgical history, Social history, and Family history were reviewed and updated as appropriate.   Son has severe anxiety and is treated also.  Please see review of systems for further details on the patient's review from today.   Objective:   Physical Exam:  LMP 12/09/1994   Physical Exam Constitutional:      General: She is not in acute distress.    Appearance: She is well-developed.  Musculoskeletal:        General: No deformity.  Neurological:     Mental Status: She is alert and oriented to person, place, and time.     Motor: No tremor.     Coordination: Coordination normal.     Gait: Gait  normal.  Psychiatric:        Attention and Perception: She is attentive. She does not perceive auditory hallucinations.        Mood and Affect: Mood is anxious and depressed. Affect is not labile, blunt, angry, tearful or inappropriate.        Speech: Speech normal. Speech is not slurred.         Behavior: Behavior normal.        Thought Content: Thought content normal. Thought content does not include homicidal or suicidal ideation. Thought content does not include homicidal or suicidal plan.        Cognition and Memory: Cognition normal.        Judgment: Judgment normal.     Comments: Insight intact. Residual obsesssions.. No auditory or visual hallucinations. No delusions.  Markedly more anxious and depressed and still rumination.  Less desperate than last time but still feels vulnerable and needy. Ruminating more than obsessing  Neat.      Lab Review:     Component Value Date/Time   NA 141 01/20/2020 1445   K 4.1 01/20/2020 1445   CL 106 01/20/2020 1445   CO2 27 01/20/2020 1445   GLUCOSE 107 (H) 01/20/2020 1445   BUN 10 01/20/2020 1445   CREATININE 0.80 01/20/2020 1445   CALCIUM 9.6 01/20/2020 1445   PROT 6.4 01/20/2020 1445   ALBUMIN 4.0 05/13/2017 1510   AST 9 (L) 01/20/2020 1445   ALT 14 01/20/2020 1445   ALKPHOS 61 05/13/2017 1510   BILITOT 0.6 01/20/2020 1445   GFRNONAA 74 01/20/2020 1445   GFRAA 85 01/20/2020 1445       Component Value Date/Time   WBC 6.7 01/20/2020 1445   RBC 4.75 01/20/2020 1445   HGB 14.0 01/20/2020 1445   HCT 41.9 01/20/2020 1445   PLT 235 01/20/2020 1445   MCV 88.2 01/20/2020 1445   MCH 29.5 01/20/2020 1445   MCHC 33.4 01/20/2020 1445   RDW 12.5 01/20/2020 1445   LYMPHSABS 2,459 01/20/2020 1445   MONOABS 0.8 12/18/2018 1628   EOSABS 181 01/20/2020 1445   BASOSABS 40 01/20/2020 1445    No results found for: POCLITH, LITHIUM   No results found for: PHENYTOIN, PHENOBARB, VALPROATE, CBMZ   .res Assessment: Plan:    Major depressive disorder, recurrent episode, moderate (HCC)  Generalized anxiety disorder  Mixed obsessional thoughts and acts  Insomnia due to mental condition    Marked  worsening of anxiety and depresssion without obvious worsening of OCD.  Worsening insomnia.  NOW REVEALED DT STOPPING CLOMIPRAMINE  ACCIDENTALLY A FEW MONTHS AGO in 2020.  Sleeping better with meds and is able to eat with Xanax.  Discussed potential metabolic side effects associated with atypical antipsychotics, as well as potential risk for movement side effects. Advised pt to contact office if movement side effects occur.   We discussed the short-term risks associated with benzodiazepines including sedation and increased fall risk among others.  Discussed long-term side effect risk including dependence, potential withdrawal symptoms, and the potential eventual dose-related risk of dementia.  But recent studies from 2020 dispute this association between benzodiazepines and dementia risk. Newer studies in 2020 do not support an association with dementia.  Supportive therapy on relapse prevention.  She feels it is manageable.  Enc exercise.  Continue clomipramine 50 mg nightly (started about 5/14) Continue Klonopin 2 mg HS Continue Seroquel 50 mg for sleep and anxiety and rumination   Ok continue Xanax daytime for anxiety and allow her  to eat..  Meds won't work if you don't get enough protein.  No  med changes today. But consider switching from Seroquel to Zyprexa to get a better antianxiety effect.  Stay out of work right now.  Is on medical leave.  Need to continue LOA for awhile.  It will take some time for clomipramine to work again.   Disc her fear of losing her job DT illness.   Continue LOA until May 29, 2020.  Disc option of Day treatment bc severity of sx and she's afraid of being alone. Option call Behavioral Health Assessment at the main [facility] phone.  Tell them I referred you to the Day treatment program.  Pt needs more time to respond to clomipramine restarted.  Encourage patience with the process.  This appt was 30 mins.  FU 3-4 week next available.   Lynder Parents, MD, DFAPA   Please see After Visit Summary for patient specific instructions.  Future Appointments  Date Time Provider [place]  05/18/2020  4:30 PM Cottle, Billey Co., MD [facility] None  08/15/2020  3:00 PM Unk Pinto, MD [facility] None    No orders of the defined types were placed in this encounter.     -------------------------------

## 2020-05-06 ENCOUNTER — Encounter: Payer: Self-pay | Admitting: Psychiatry

## 2020-05-18 ENCOUNTER — Ambulatory Visit (INDEPENDENT_AMBULATORY_CARE_PROVIDER_SITE_OTHER): Payer: Medicare PPO | Admitting: Psychiatry

## 2020-05-18 ENCOUNTER — Encounter: Payer: Self-pay | Admitting: Psychiatry

## 2020-05-18 ENCOUNTER — Other Ambulatory Visit: Payer: Self-pay

## 2020-05-18 DIAGNOSIS — F5105 Insomnia due to other mental disorder: Secondary | ICD-10-CM | POA: Diagnosis not present

## 2020-05-18 DIAGNOSIS — F3342 Major depressive disorder, recurrent, in full remission: Secondary | ICD-10-CM

## 2020-05-18 DIAGNOSIS — F411 Generalized anxiety disorder: Secondary | ICD-10-CM | POA: Diagnosis not present

## 2020-05-18 DIAGNOSIS — F422 Mixed obsessional thoughts and acts: Secondary | ICD-10-CM | POA: Diagnosis not present

## 2020-05-18 NOTE — Progress Notes (Signed)
[name] [id number] 31-Oct-1946 74 y.o.  Subjective:   Patient ID:  Donna Dillon is a 74 y.o. (DOB October 04, 1946) female.  Chief Complaint:  Chief Complaint  Patient presents with  . Follow-up  . Anxiety    HPI PRISILA Dillon presents to the office today for follow-up of OCD.    seen December 2020.  No meds were changed.  Last seen Apr 10, 2020.  The following is noted: She is markedly worse. "falling apart".  More fatigued since dilation of esophagus.  January more dry eyes.  Hair falling out.  TSH is normal.   More sleep problems since Feb. Called and increased clonazepam to 2 mg HS bc also the insomnia was causing more anxiety.  Now also scared of taking the clonazepam.   Added trazodone 100 mg and it didn't help sleep. More obsessed with heart rate being elevated to 100 bpm.   Can't work DT anxiety and insomnia and fear over her heart. Scared.  She was ruminating. Plan:Increase atenolol for anxiety and tachycardia to 25 mg daily. DC trazodone Seroquel 25-50 mg for sleep Needs increased BZ .  She is afraid increasing the clonazepam but may be willing to take Xanax during the day.  Stay out of work this week. Disc option of Day treatment bc severity of sx and she's afraid of being alone. Option call Behavioral Health Assessment at the main [facility] phone.  Tell them I referred you to the Day treatment program. Return to office in 4 days.  04/14/2020 appointment the following is noted: Took 37.5 mg Seroquel Monday with 2 mg Klonopin and awoke in 3 hour. Afterwards usually took Seroquel 50 mg since then with Klonopin.  Lost another 1# and total of 11#/3 weeks.  Eating after taking alprazolam.  Can't quilt or read or focus on TV.  Heart rate is better after increase atenolol to 25 mg daily. Now 5-6 hours of sleep. [name]. Plan: Start clomipramine 25 mg 1 capsule for 10 days, then 2 each night or 50 mg  nightly. Continue Klonopin 2 mg HS Increase Seroquel 75 mg for sleep and anxiety and rumination Needs increased BZ .  She is afraid increasing the clonazepam but may be willing to take Xanax during the day.  Ok continue Xanax daytime for anxiety and allow her to eat..  Stay out of work right now.  Is on medical leave. Disc option of Day treatment bc severity of sx and she's afraid of being alone. Option call Behavioral Health Assessment at the main [facility] phone.  Tell them I referred you to the Day treatment program.  04/21/2020 urgent appointment, the following is noted: Generally sleeping better with quetiapine 75 mg HS.  Getting hangover.  Has to take Xanax to eat.  Losing weight 10#.  Just wants to lay down.  Feels shakey and weak. Did get restarted on clomipramine 25 mg HS.  Fatigue and hard to concentrate.   Plan: Continue medical leave until May 29, 2020.   The following med decisions were made: Continue clomipramine 25 mg 1 capsule for 10 days, then 2 each night or 50 mg nightly. Continue Klonopin 2 mg HS Increase Seroquel 75 mg for sleep and anxiety and rumination   05/03/2020 appointment urgently with the following noted:  Doesn't feel better yet.  Seroquel 75 gave her hangover.  With 50 mg usually 8 hours but some EMA esp if anxiety is bad.  Lays in bed  until noon bc doesn't want to do anything  In bed to escape. When first started clomipramine 25 had sig insomnia.  At 50 mg now Appetite still poor and lost 10#.  Forcing food for 6 weeks. Idleness is not good for worry but can't focus well enough to read as much as she would usually otherwise bc anxiety interferes. Taking alprazolam 0.5 mg in pm to help eat dinner.  Getting from [place]. No med changes  05/18/20 appt with the following noted:  Another urgent appt DT severity of anxiety. On clomipramine 50 mg since about 5/14. Appetite not better yet.  Lost total 12-13#.   Not depressed. Feels sleepy.  Always tired.  Wonders if  there's medical cause. Sleeping a lot with Seroquel 50 mg HS. Notices less anxiety by "a little".   A little more obsessions since being at home but not severe. Not currently physically able to do the job and embarrassed about being out of work.  Disc concerns about her age and work and wondering about retirement from it fully.   Son [name] Cancer doing ok in computers. 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  Surprisingly well. Still has compulsions including night eating.  Married since 2016.   Hasn't travelled lately but wants to.  Enjoys [place]. [place].  Has a dog.  Past Psychiatric Medication Trials:  Clomipramine,  Poor response SSRIs  Trazodone 100 NR Seroquel 75 hangover  Review of Systems:  Review of Systems  Constitutional: Positive for appetite change and unexpected weight change.       Lost 10-12 # in 3 weeks  Respiratory: Negative for shortness of breath.   Gastrointestinal: Negative for abdominal pain.  Neurological: Negative for dizziness, tremors, weakness and headaches.  Psychiatric/Behavioral: Negative for agitation, behavioral problems, confusion, decreased concentration, dysphoric mood, hallucinations, self-injury, sleep disturbance and suicidal ideas. The patient is nervous/anxious. The patient is not hyperactive.     Medications: I have reviewed the patient's current medications.  Current Outpatient Medications  Medication Sig Dispense Refill  . ALPRAZolam (XANAX) 1 MG tablet Take 0.25 mg by mouth 2 (two) times daily as needed for anxiety.     Marland Kitchen atenolol (TENORMIN) 25 MG tablet Take 1 tablet Daily for BP 90 tablet 3  . Calcium 1500 MG tablet Take 1,500 mg by mouth daily.     . clomiPRAMINE (ANAFRANIL) 25 MG capsule 1 at night for 10 days then 2 each night (Patient taking differently: Take 50 mg by mouth daily. ) 60 capsule 0  . clonazePAM (KLONOPIN) 2 MG tablet Take 1 tablet (2 mg total) by mouth at bedtime. (Patient taking differently: Take 1 mg by mouth at  bedtime. ) 90 tablet 1  . ezetimibe (ZETIA) 10 MG tablet Take 1 tablet Daily for Cholesterol 90 tablet 3  . fish oil-omega-3 fatty acids 1000 MG capsule Take 1 g by mouth daily.    . magnesium 30 MG tablet Take 30 mg by mouth daily.     . Multiple Vitamin (MULTIVITAMIN) tablet Take 1 tablet by mouth daily.    . Multiple Vitamins-Minerals (EYE VITAMINS PO) Take 1 capsule by mouth daily. Ocusight Vitamins    . omeprazole (PRILOSEC) 40 MG capsule Take 1 capsule (40 mg total) by mouth daily. 30 capsule 11  . QUEtiapine (SEROQUEL) 25 MG tablet Take 3 tablets (75 mg total) by mouth at bedtime. (Patient taking differently: Take 50 mg by mouth at bedtime. ) 90 tablet 0  . vitamin C (ASCORBIC ACID) 500 MG  tablet Take 500 mg by mouth daily.    Marland Kitchen VITAMIN D PO Take 5,000 Units by mouth. Takes 1 to 2 capsules daily.    Marland Kitchen zinc sulfate (ZINC-220) 220 (50 Zn) MG capsule Take 220 mg by mouth daily.    . clonazePAM (KLONOPIN) 0.5 MG tablet Take 1 tablet (0.5 mg total) by mouth 2 (two) times daily as needed for anxiety. (Patient not taking: Reported on 05/18/2020) 60 tablet 0   No current facility-administered medications for this visit.    Medication Side Effects: None  Allergies:  Allergies  Allergen Reactions  . Prednisone     Altered mental state  . Penicillins   . Asa [Aspirin] Diarrhea  . Levaquin [Levofloxacin] Other (See Comments)    Joint Pain  . Macrolides And Ketolides Nausea And Vomiting  . Paxil [Paroxetine Hcl] Nausea And Vomiting  . Tetracyclines & Related Nausea And Vomiting  . Zoloft [Sertraline Hcl] Nausea And Vomiting  . Sulfa Antibiotics Rash  . Trimethoprim Rash    Past Medical History:  Diagnosis Date  . Anemia    mild  . Anxiety   . Colon polyp    hyperplastic  . Diverticulosis   . Esophageal stricture   . GERD (gastroesophageal reflux disease)    no s/s now- off medicines  . Hyperlipidemia   . Palpitations   . Prediabetes   . Tachycardia     Family History   Problem Relation Age of Onset  . Kidney cancer Father   . Hypertension Mother   . Heart disease Mother   . Colon cancer Paternal Aunt   . Colon polyps Neg Hx   . Esophageal cancer Neg Hx   . Rectal cancer Neg Hx   . Stomach cancer Neg Hx     Social History   Socioeconomic History  . Marital status: Married    Spouse name: Not on file  . Number of children: 1  . Years of education: Not on file  . Highest education level: Not on file  Occupational History  . Occupation: Librarian  Tobacco Use  . Smoking status: Never Smoker  . Smokeless tobacco: Never Used  Vaping Use  . Vaping Use: Never used  Substance and Sexual Activity  . Alcohol use: No    Alcohol/week: 0.0 standard drinks  . Drug use: No  . Sexual activity: Not on file  Other Topics Concern  . Not on file  Social History Narrative  . Not on file   Social Determinants of Health   Financial Resource Strain:   . Difficulty of Paying Living Expenses:   Food Insecurity:   . Worried About Charity fundraiser in the Last Year:   . Arboriculturist in the Last Year:   Transportation Needs:   . Film/video editor (Medical):   Marland Kitchen Lack of Transportation (Non-Medical):   Physical Activity:   . Days of Exercise per Week:   . Minutes of Exercise per Session:   Stress:   . Feeling of Stress :   Social Connections:   . Frequency of Communication with Friends and Family:   . Frequency of Social Gatherings with Friends and Family:   . Attends Religious Services:   . Active Member of Clubs or Organizations:   . Attends Archivist Meetings:   Marland Kitchen Marital Status:   Intimate Partner Violence:   . Fear of Current or Ex-Partner:   . Emotionally Abused:   Marland Kitchen Physically Abused:   . Sexually  Abused:     Past Medical History, Surgical history, Social history, and Family history were reviewed and updated as appropriate.   Son has severe anxiety and is treated also.  Please see review of systems for further  details on the patient's review from today.   Objective:   Physical Exam:  LMP 12/09/1994   Physical Exam Constitutional:      General: She is not in acute distress.    Appearance: She is well-developed.  Musculoskeletal:        General: No deformity.  Neurological:     Mental Status: She is alert and oriented to person, place, and time.     Motor: No tremor.     Coordination: Coordination normal.     Gait: Gait normal.  Psychiatric:        Attention and Perception: She is attentive. She does not perceive auditory hallucinations.        Mood and Affect: Mood is anxious and depressed. Affect is not labile, blunt, angry, tearful or inappropriate.        Speech: Speech normal. Speech is not slurred.        Behavior: Behavior normal.        Thought Content: Thought content normal. Thought content does not include homicidal or suicidal ideation. Thought content does not include homicidal or suicidal plan.        Cognition and Memory: Cognition normal.        Judgment: Judgment normal.     Comments: Insight intact. Residual obsesssions.. No auditory or visual hallucinations. No delusions.  Markedly more anxious and depressed and still rumination.  Less desperate than last time but still feels vulnerable and needy. Ruminating more than obsessing  Neat.      Lab Review:     Component Value Date/Time   NA 141 01/20/2020 1445   K 4.1 01/20/2020 1445   CL 106 01/20/2020 1445   CO2 27 01/20/2020 1445   GLUCOSE 107 (H) 01/20/2020 1445   BUN 10 01/20/2020 1445   CREATININE 0.80 01/20/2020 1445   CALCIUM 9.6 01/20/2020 1445   PROT 6.4 01/20/2020 1445   ALBUMIN 4.0 05/13/2017 1510   AST 9 (L) 01/20/2020 1445   ALT 14 01/20/2020 1445   ALKPHOS 61 05/13/2017 1510   BILITOT 0.6 01/20/2020 1445   GFRNONAA 74 01/20/2020 1445   GFRAA 85 01/20/2020 1445       Component Value Date/Time   WBC 6.7 01/20/2020 1445   RBC 4.75 01/20/2020 1445   HGB 14.0 01/20/2020 1445   HCT 41.9  01/20/2020 1445   PLT 235 01/20/2020 1445   MCV 88.2 01/20/2020 1445   MCH 29.5 01/20/2020 1445   MCHC 33.4 01/20/2020 1445   RDW 12.5 01/20/2020 1445   LYMPHSABS 2,459 01/20/2020 1445   MONOABS 0.8 12/18/2018 1628   EOSABS 181 01/20/2020 1445   BASOSABS 40 01/20/2020 1445    No results found for: POCLITH, LITHIUM   No results found for: PHENYTOIN, PHENOBARB, VALPROATE, CBMZ   .res Assessment: Plan:    Generalized anxiety disorder  Major depression, recurrent, full remission ([place])  Mixed obsessional thoughts and acts  Insomnia due to mental condition    Marked  worsening of anxiety and depresssion without obvious worsening of OCD.  Worsening insomnia.  NOW REVEALED DT STOPPING CLOMIPRAMINE ACCIDENTALLY A FEW MONTHS AGO in 2020. Is improving some now. Needs more time to help fully.    Sleeping better with meds and is able to eat with Xanax.  Having trouble eating still but Xanax helps.  Discussed potential metabolic side effects associated with atypical antipsychotics, as well as potential risk for movement side effects. Advised pt to contact office if movement side effects occur.   We discussed the short-term risks associated with benzodiazepines including sedation and increased fall risk among others.  Discussed long-term side effect risk including dependence, potential withdrawal symptoms, and the potential eventual dose-related risk of dementia.  But recent studies from 2020 dispute this association between benzodiazepines and dementia risk. Newer studies in 2020 do not support an association with dementia. Disc her fears of addiction.  Wants to eventually consider tapering off of it using the quetiapine to protect her sleep.  Supportive therapy on relapse prevention and what to do to deal with her job and anxiety.    Enc exercise.  Start back.  She doesn't feel capable yet but did before.  Continue clomipramine 50 mg nightly (started about 5/14) Continue Klonopin 2 mg  HS Continue Seroquel 25-50 mg for sleep and anxiety and rumination .  Try reducing.  Ok continue Xanax daytime for anxiety and allow her to eat..  Meds won't work if you don't get enough protein.  No  med changes today. t.  Stay out of work right now.  Is on medical leave.  Need to continue LOA for awhile.  It will take some time for clomipramine to work again.   Disc her fear of losing her job DT illness.  Disc concerns about stigma with absence.  Continue LOA until May 29, 2020.  Disc option of Day treatment bc severity of sx and she's afraid of being alone. Option call Behavioral Health Assessment at the main [facility] phone.  Tell them I referred you to the Day treatment program.  Pt needs more time to respond to clomipramine restarted.  Encourage patience with the process. Needs still a good amount of support.  Not hopeless anymore.    This appt was 30 mins.  FU June 30   Lynder Parents, MD, DFAPA   Please see After Visit Summary for patient specific instructions.  Future Appointments  Date Time Provider [place]  06/07/2020  2:30 PM Cottle, Billey Co., MD [facility] None  08/15/2020  3:00 PM Unk Pinto, MD [facility] None    No orders of the defined types were placed in this encounter.     -------------------------------

## 2020-06-07 ENCOUNTER — Other Ambulatory Visit: Payer: Self-pay

## 2020-06-07 ENCOUNTER — Encounter: Payer: Self-pay | Admitting: Psychiatry

## 2020-06-07 ENCOUNTER — Ambulatory Visit (INDEPENDENT_AMBULATORY_CARE_PROVIDER_SITE_OTHER): Payer: Medicare PPO | Admitting: Psychiatry

## 2020-06-07 DIAGNOSIS — F5105 Insomnia due to other mental disorder: Secondary | ICD-10-CM

## 2020-06-07 DIAGNOSIS — F411 Generalized anxiety disorder: Secondary | ICD-10-CM

## 2020-06-07 DIAGNOSIS — F331 Major depressive disorder, recurrent, moderate: Secondary | ICD-10-CM

## 2020-06-07 DIAGNOSIS — F422 Mixed obsessional thoughts and acts: Secondary | ICD-10-CM

## 2020-06-07 MED ORDER — QUETIAPINE FUMARATE 25 MG PO TABS: 75.0000 mg | ORAL_TABLET | Freq: Every day | ORAL | 0 refills | Status: DC

## 2020-06-07 MED ORDER — CLOMIPRAMINE HCL 50 MG PO CAPS: 50.0000 mg | ORAL_CAPSULE | Freq: Every day | ORAL | 1 refills | Status: DC

## 2020-06-07 NOTE — Progress Notes (Signed)
Donna Dillon [id number] Dec 01, 1946 74 y.o.  Subjective:   Patient ID:  Donna Dillon is a 74 y.o. (DOB [birth year]) female.  Chief Complaint:  Chief Complaint  Patient presents with  . Follow-up  . Depression  . Anxiety  . Sleeping Problem    HPI Donna Dillon presents to the office today for follow-up of OCD.    seen December 2020.  No meds were changed.  Last seen Apr 10, 2020.  The following is noted: She is markedly worse. "falling apart".  More fatigued since dilation of esophagus.  January more dry eyes.  Hair falling out.  TSH is normal.   More sleep problems since Feb. Called and increased clonazepam to 2 mg HS bc also the insomnia was causing more anxiety.  Now also scared of taking the clonazepam.   Added trazodone 100 mg and it didn't help sleep. More obsessed with heart rate being elevated to 100 bpm.   Can't work DT anxiety and insomnia and fear over her heart. Scared.  She was ruminating. Plan:Increase atenolol for anxiety and tachycardia to 25 mg daily. DC trazodone Seroquel 25-50 mg for sleep Needs increased BZ .  She is afraid increasing the clonazepam but may be willing to take Xanax during the day.  Stay out of work this week. Disc option of Day treatment bc severity of sx and she's afraid of being alone. Option call Behavioral Health Assessment at the main [facility] phone.  Tell them I referred you to the Day treatment program. Return to office in 4 days.  04/14/2020 appointment the following is noted: Took 37.5 mg Seroquel Monday with 2 mg Klonopin and awoke in 3 hour. Afterwards usually took Seroquel 50 mg since then with Klonopin.  Lost another 1# and total of 11#/3 weeks.  Eating after taking alprazolam.  Can't quilt or read or focus on TV.  Heart rate is better after increase atenolol to 25 mg daily. Now 5-6 hours of sleep. [name]. Plan: Start clomipramine 25 mg 1 capsule for 10 days, then 2  each night or 50 mg nightly. Continue Klonopin 2 mg HS Increase Seroquel 75 mg for sleep and anxiety and rumination Needs increased BZ .  She is afraid increasing the clonazepam but may be willing to take Xanax during the day.  Ok continue Xanax daytime for anxiety and allow her to eat..  Stay out of work right now.  Is on medical leave. Disc option of Day treatment bc severity of sx and she's afraid of being alone. Option call Behavioral Health Assessment at the main [facility] phone.  Tell them I referred you to the Day treatment program.  04/21/2020 urgent appointment, the following is noted: Generally sleeping better with quetiapine 75 mg HS.  Getting hangover.  Has to take Xanax to eat.  Losing weight 10#.  Just wants to lay down.  Feels shakey and weak. Did get restarted on clomipramine 25 mg HS.  Fatigue and hard to concentrate.   Plan: Continue medical leave until May 29, 2020.   The following med decisions were made: Continue clomipramine 25 mg 1 capsule for 10 days, then 2 each night or 50 mg nightly. Continue Klonopin 2 mg HS Increase Seroquel 75 mg for sleep and anxiety and rumination   05/03/2020 appointment urgently with the following noted:  Doesn't feel better yet.  Seroquel 75 gave her hangover.  With 50 mg usually 8 hours but some EMA esp if  anxiety is bad.  Lays in bed until noon bc doesn't want to do anything  In bed to escape. When first started clomipramine 25 had sig insomnia.  At 50 mg now Appetite still poor and lost 10#.  Forcing food for 6 weeks. Idleness is not good for worry but can't focus well enough to read as much as she would usually otherwise bc anxiety interferes. Taking alprazolam 0.5 mg in pm to help eat dinner.  Getting from [place]. No med changes  05/18/20 appt with the following noted:  Another urgent appt DT severity of anxiety. On clomipramine 50 mg since about 5/14. Appetite not better yet.  Lost total 12-13#.   Not depressed. Feels sleepy.  Always  tired.  Wonders if there's medical cause. Sleeping a lot with Seroquel 50 mg HS. Notices less anxiety by "a little".   A little more obsessions since being at home but not severe. Not currently physically able to do the job and embarrassed about being out of work.  Disc concerns about her age and work and wondering about retirement from it fully.  No med changes.  06/07/2020 appointment with the following noted: Sleeping well with clonazepam 2 mg HS and quetiapine 50 mg HS. Still on clomipramine 50 since 04/21/20. Still exhausted from going to grocery store.  Not mentally exhausted but is physically.   Has remained on medical leave but not covered by FMLA bc not working FT. Decided to resign.  Doesn't feel able to do it yet.  Hopes to get to volunteer in the fall. Naps a good deal.  Son Donna Dillon doing ok in computers. 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  Surprisingly well. Still has compulsions including night eating.  Married since 2016.   Hasn't travelled lately but wants to.  Enjoys [place]. [place].  Has a dog.  Past Psychiatric Medication Trials:  Clomipramine,  Poor response SSRIs  Trazodone 100 NR Seroquel 75 hangover  Review of Systems:  Review of Systems  Constitutional: Positive for appetite change, fatigue and unexpected weight change.       Lost 10-12 # in 3 weeks  Respiratory: Negative for shortness of breath.   Gastrointestinal: Negative for abdominal pain.  Neurological: Negative for dizziness, tremors, weakness and headaches.  Psychiatric/Behavioral: Negative for agitation, behavioral problems, confusion, decreased concentration, dysphoric mood, hallucinations, self-injury, sleep disturbance and suicidal ideas. The patient is nervous/anxious. The patient is not hyperactive.     Medications: I have reviewed the patient's current medications.  Current Outpatient Medications  Medication Sig Dispense Refill  . ALPRAZolam (XANAX) 1 MG tablet Take 0.25 mg by mouth 2  (two) times daily as needed for anxiety.     Marland Kitchen atenolol (TENORMIN) 25 MG tablet Take 1 tablet Daily for BP 90 tablet 3  . Calcium 1500 MG tablet Take 1,500 mg by mouth daily.     . clomiPRAMINE (ANAFRANIL) 25 MG capsule 1 at night for 10 days then 2 each night (Patient taking differently: Take 50 mg by mouth daily. ) 60 capsule 0  . clonazePAM (KLONOPIN) 2 MG tablet Take 1 tablet (2 mg total) by mouth at bedtime. 90 tablet 1  . ezetimibe (ZETIA) 10 MG tablet Take 1 tablet Daily for Cholesterol 90 tablet 3  . fish oil-omega-3 fatty acids 1000 MG capsule Take 1 g by mouth daily.    . magnesium 30 MG tablet Take 30 mg by mouth daily.     . Multiple Vitamin (MULTIVITAMIN) tablet Take 1 tablet by  mouth daily.    . Multiple Vitamins-Minerals (EYE VITAMINS PO) Take 1 capsule by mouth daily. Ocusight Vitamins    . omeprazole (PRILOSEC) 40 MG capsule Take 1 capsule (40 mg total) by mouth daily. 30 capsule 11  . QUEtiapine (SEROQUEL) 25 MG tablet Take 3 tablets (75 mg total) by mouth at bedtime. 270 tablet 0  . vitamin C (ASCORBIC ACID) 500 MG tablet Take 500 mg by mouth daily.    Marland Kitchen VITAMIN D PO Take 5,000 Units by mouth. Takes 1 to 2 capsules daily.    Marland Kitchen zinc sulfate (ZINC-220) 220 (50 Zn) MG capsule Take 220 mg by mouth daily.    . clomiPRAMINE (ANAFRANIL) 50 MG capsule Take 1 capsule (50 mg total) by mouth at bedtime. 90 capsule 1   No current facility-administered medications for this visit.    Medication Side Effects: None  Allergies:  Allergies  Allergen Reactions  . Prednisone     Altered mental state  . Penicillins   . Asa [Aspirin] Diarrhea  . Levaquin [Levofloxacin] Other (See Comments)    Joint Pain  . Macrolides And Ketolides Nausea And Vomiting  . Paxil [Paroxetine Hcl] Nausea And Vomiting  . Tetracyclines & Related Nausea And Vomiting  . Zoloft [Sertraline Hcl] Nausea And Vomiting  . Sulfa Antibiotics Rash  . Trimethoprim Rash    Past Medical History:  Diagnosis Date  .  Anemia    mild  . Anxiety   . Colon polyp    hyperplastic  . Diverticulosis   . Esophageal stricture   . GERD (gastroesophageal reflux disease)    no s/s now- off medicines  . Hyperlipidemia   . Palpitations   . Prediabetes   . Tachycardia     Family History  Problem Relation Age of Onset  . Kidney Dillon Father   . Hypertension Mother   . Heart disease Mother   . Colon Dillon Paternal Aunt   . Colon polyps Neg Hx   . Esophageal Dillon Neg Hx   . Rectal Dillon Neg Hx   . Stomach Dillon Neg Hx     Social History   Socioeconomic History  . Marital status: Married    Spouse name: Not on file  . Number of children: 1  . Years of education: Not on file  . Highest education level: Not on file  Occupational History  . Occupation: Librarian  Tobacco Use  . Smoking status: Never Smoker  . Smokeless tobacco: Never Used  Vaping Use  . Vaping Use: Never used  Substance and Sexual Activity  . Alcohol use: No    Alcohol/week: 0.0 standard drinks  . Drug use: No  . Sexual activity: Not on file  Other Topics Concern  . Not on file  Social History Narrative  . Not on file   Social Determinants of Health   Financial Resource Strain:   . Difficulty of Paying Living Expenses:   Food Insecurity:   . Worried About Charity fundraiser in the Last Year:   . Arboriculturist in the Last Year:   Transportation Needs:   . Film/video editor (Medical):   Marland Kitchen Lack of Transportation (Non-Medical):   Physical Activity:   . Days of Exercise per Week:   . Minutes of Exercise per Session:   Stress:   . Feeling of Stress :   Social Connections:   . Frequency of Communication with Friends and Family:   . Frequency of Social Gatherings with Friends and  Family:   . Attends Religious Services:   . Active Member of Clubs or Organizations:   . Attends Archivist Meetings:   Marland Kitchen Marital Status:   Intimate Partner Violence:   . Fear of Current or Ex-Partner:   . Emotionally  Abused:   Marland Kitchen Physically Abused:   . Sexually Abused:     Past Medical History, Surgical history, Social history, and Family history were reviewed and updated as appropriate.   Son has severe anxiety and is treated also.  Please see review of systems for further details on the patient's review from today.   Objective:   Physical Exam:  LMP 12/09/1994   Physical Exam Constitutional:      General: She is not in acute distress.    Appearance: She is well-developed.  Musculoskeletal:        General: No deformity.  Neurological:     Mental Status: She is alert and oriented to person, place, and time.     Motor: No tremor.     Coordination: Coordination normal.     Gait: Gait normal.  Psychiatric:        Attention and Perception: She is attentive. She does not perceive auditory hallucinations.        Mood and Affect: Mood is anxious and depressed. Affect is not labile, blunt, angry, tearful or inappropriate.        Speech: Speech normal. Speech is not slurred.        Behavior: Behavior normal.        Thought Content: Thought content normal. Thought content does not include homicidal or suicidal ideation. Thought content does not include homicidal or suicidal plan.        Cognition and Memory: Cognition normal.        Judgment: Judgment normal.     Comments: Insight intact. Residual obsesssions.. No auditory or visual hallucinations. No delusions.  Less anxious and depressed and appears to have  rumination.  Ruminating more than obsessing  Neat. Not calm enough yet      Lab Review:     Component Value Date/Time   NA 141 01/20/2020 1445   K 4.1 01/20/2020 1445   CL 106 01/20/2020 1445   CO2 27 01/20/2020 1445   GLUCOSE 107 (H) 01/20/2020 1445   BUN 10 01/20/2020 1445   CREATININE 0.80 01/20/2020 1445   CALCIUM 9.6 01/20/2020 1445   PROT 6.4 01/20/2020 1445   ALBUMIN 4.0 05/13/2017 1510   AST 9 (L) 01/20/2020 1445   ALT 14 01/20/2020 1445   ALKPHOS 61 05/13/2017 1510    BILITOT 0.6 01/20/2020 1445   GFRNONAA 74 01/20/2020 1445   GFRAA 85 01/20/2020 1445       Component Value Date/Time   WBC 6.7 01/20/2020 1445   RBC 4.75 01/20/2020 1445   HGB 14.0 01/20/2020 1445   HCT 41.9 01/20/2020 1445   PLT 235 01/20/2020 1445   MCV 88.2 01/20/2020 1445   MCH 29.5 01/20/2020 1445   MCHC 33.4 01/20/2020 1445   RDW 12.5 01/20/2020 1445   LYMPHSABS 2,459 01/20/2020 1445   MONOABS 0.8 12/18/2018 1628   EOSABS 181 01/20/2020 1445   BASOSABS 40 01/20/2020 1445    No results found for: POCLITH, LITHIUM   No results found for: PHENYTOIN, PHENOBARB, VALPROATE, CBMZ   .res Assessment: Plan:    Major depressive disorder, recurrent episode, moderate (HCC) - Plan: clomiPRAMINE (ANAFRANIL) 50 MG capsule, QUEtiapine (SEROQUEL) 25 MG tablet  Generalized anxiety disorder - Plan: clomiPRAMINE (  ANAFRANIL) 50 MG capsule, QUEtiapine (SEROQUEL) 25 MG tablet  Mixed obsessional thoughts and acts - Plan: clomiPRAMINE (ANAFRANIL) 50 MG capsule  Insomnia due to mental condition - Plan: QUEtiapine (SEROQUEL) 25 MG tablet    Marked  worsening of anxiety and depresssion without obvious worsening of OCD after STOPPING CLOMIPRAMINE ACCIDENTALLY A FEW MONTHS AGO in 2020. Is improving some now. Needs more time to help fully.    Sleeping better with meds and is able to eat with Xanax.  Having trouble eating still but Xanax helps.  Discussed potential metabolic side effects associated with atypical antipsychotics, as well as potential risk for movement side effects. Advised pt to contact office if movement side effects occur.   We discussed the short-term risks associated with benzodiazepines including sedation and increased fall risk among others.  Discussed long-term side effect risk including dependence, potential withdrawal symptoms, and the potential eventual dose-related risk of dementia.  But recent studies from 2020 dispute this association between benzodiazepines and  dementia risk. Newer studies in 2020 do not support an association with dementia. Disc her fears of addiction.  Wants to eventually consider tapering off of it using the quetiapine to protect her sleep.  Supportive therapy on relapse prevention and what to do to deal with her job and anxiety. Keep herself occupied in retirement.     Enc exercise.  Start back.  She doesn't feel capable yet but did before.  Continue clomipramine 50 mg nightly (started about 5/14) Continue Klonopin 2 mg HS Continue Seroquel 25-50 mg for sleep and anxiety and rumination .  Try reducing as soon as she can to help energy.  Ok continue Xanax daytime for anxiety and allow her to eat..  Meds won't work if you don't get enough protein.  No  med changes today. t.  This appt was 30 mins.  FU 6-8 weeks   Lynder Parents, MD, DFAPA   Please see After Visit Summary for patient specific instructions.  Future Appointments  Date Time Provider [place]  08/15/2020  3:00 PM Unk Pinto, MD [id number] None    No orders of the defined types were placed in this encounter.     -------------------------------

## 2020-06-07 NOTE — Patient Instructions (Addendum)
When you feel like you can reduce quetiapine to 1 tablet at night.

## 2020-08-10 ENCOUNTER — Other Ambulatory Visit: Payer: Self-pay

## 2020-08-10 ENCOUNTER — Ambulatory Visit (INDEPENDENT_AMBULATORY_CARE_PROVIDER_SITE_OTHER): Payer: Medicare PPO | Admitting: Psychiatry

## 2020-08-10 ENCOUNTER — Encounter: Payer: Self-pay | Admitting: Psychiatry

## 2020-08-10 DIAGNOSIS — F411 Generalized anxiety disorder: Secondary | ICD-10-CM

## 2020-08-10 DIAGNOSIS — R000 Tachycardia, unspecified: Secondary | ICD-10-CM | POA: Diagnosis not present

## 2020-08-10 DIAGNOSIS — F331 Major depressive disorder, recurrent, moderate: Secondary | ICD-10-CM | POA: Diagnosis not present

## 2020-08-10 DIAGNOSIS — F5105 Insomnia due to other mental disorder: Secondary | ICD-10-CM

## 2020-08-10 DIAGNOSIS — F422 Mixed obsessional thoughts and acts: Secondary | ICD-10-CM

## 2020-08-10 NOTE — Progress Notes (Signed)
LISL SLINGERLAND [id number] June 13, 1946 74 y.o.  Subjective:   Patient ID:  Donna Dillon is a 74 y.o. (DOB 07/03/1946) female.  Chief Complaint:  Chief Complaint  Patient presents with  . Follow-up    Medication Management  . Depression    Medication Management  . Anxiety    OCD  . Sleeping Problem    Depression        Associated symptoms include fatigue.  Associated symptoms include no decreased concentration, no appetite change, no headaches and no suicidal ideas.  DHYANA BASTONE presents to the office today for follow-up of OCD.    seen December 2020.  No meds were changed.  Last seen Apr 10, 2020.  The following is noted: She is markedly worse. "falling apart".  More fatigued since dilation of esophagus.  January more dry eyes.  Hair falling out.  TSH is normal.   More sleep problems since Feb. Called and increased clonazepam to 2 mg HS bc also the insomnia was causing more anxiety.  Now also scared of taking the clonazepam.   Added trazodone 100 mg and it didn't help sleep. More obsessed with heart rate being elevated to 100 bpm.   Can't work DT anxiety and insomnia and fear over her heart. Scared.  She was ruminating. Plan:Increase atenolol for anxiety and tachycardia to 25 mg daily. DC trazodone Seroquel 25-50 mg for sleep Needs increased BZ .  She is afraid increasing the clonazepam but may be willing to take Xanax during the day.  Stay out of work this week. Disc option of Day treatment bc severity of sx and she's afraid of being alone. Option call Behavioral Health Assessment at the main [facility] phone.  Tell them I referred you to the Day treatment program. Return to office in 4 days.  04/14/2020 appointment the following is noted: Took 37.5 mg Seroquel Monday with 2 mg Klonopin and awoke in 3 hour. Afterwards usually took Seroquel 50 mg since then with Klonopin.  Lost another 1# and total of 11#/3 weeks.  Eating after taking alprazolam.  Can't quilt or read or  focus on TV.  Heart rate is better after increase atenolol to 25 mg daily. Now 5-6 hours of sleep. [name]. Plan: Start clomipramine 25 mg 1 capsule for 10 days, then 2 each night or 50 mg nightly. Continue Klonopin 2 mg HS Increase Seroquel 75 mg for sleep and anxiety and rumination Needs increased BZ .  She is afraid increasing the clonazepam but may be willing to take Xanax during the day.  Ok continue Xanax daytime for anxiety and allow her to eat..  Stay out of work right now.  Is on medical leave. Disc option of Day treatment bc severity of sx and she's afraid of being alone. Option call Behavioral Health Assessment at the main [facility] phone.  Tell them I referred you to the Day treatment program.  04/21/2020 urgent appointment, the following is noted: Generally sleeping better with quetiapine 75 mg HS.  Getting hangover.  Has to take Xanax to eat.  Losing weight 10#.  Just wants to lay down.  Feels shakey and weak. Did get restarted on clomipramine 25 mg HS.  Fatigue and hard to concentrate.   Plan: Continue medical leave until May 29, 2020.   The following med decisions were made: Continue clomipramine 25 mg 1 capsule for 10 days, then 2 each night or 50 mg nightly. Continue Klonopin 2 mg HS  Increase Seroquel 75 mg for sleep and anxiety and rumination   05/03/2020 appointment urgently with the following noted:  Doesn't feel better yet.  Seroquel 75 gave her hangover.  With 50 mg usually 8 hours but some EMA esp if anxiety is bad.  Lays in bed until noon bc doesn't want to do anything  In bed to escape. When first started clomipramine 25 had sig insomnia.  At 50 mg now Appetite still poor and lost 10#.  Forcing food for 6 weeks. Idleness is not good for worry but can't focus well enough to read as much as she would usually otherwise bc anxiety interferes. Taking alprazolam 0.5 mg in pm to help eat dinner.  Getting from  [place]. No med changes  05/18/20 appt with the following noted:  Another urgent appt DT severity of anxiety. On clomipramine 50 mg since about 5/14. Appetite not better yet.  Lost total 12-13#.   Not depressed. Feels sleepy.  Always tired.  Wonders if there's medical cause. Sleeping a lot with Seroquel 50 mg HS. Notices less anxiety by "a little".   A little more obsessions since being at home but not severe. Not currently physically able to do the job and embarrassed about being out of work.  Disc concerns about her age and work and wondering about retirement from it fully.  No med changes.  06/07/2020 appointment with the following noted: Sleeping well with clonazepam 2 mg HS and quetiapine 50 mg HS. Still on clomipramine 50 since 04/21/20. Still exhausted from going to grocery store.  Not mentally exhausted but is physically.   Has remained on medical leave but not covered by FMLA bc not working FT. Decided to resign.  Doesn't feel able to do it yet.  Hopes to get to volunteer in the fall. Naps a good deal. Plan: Continue clomipramine 50 mg nightly (started about 5/14) Continue Klonopin 2 mg HS Continue Seroquel 25-50 mg for sleep and anxiety and rumination .  Try reducing as soon as she can to help energy. Ok continue Xanax daytime for anxiety and allow her to eat..  Meds won't work if you don't get enough protein.  08/10/20 appt with the following noted:  Nothing to do but gave job up bc didn't think she could do it physically. Did clean her house well.   Clomipramine has kicked in but hard time bc nothing to divert her attention.   No anxiety until married. After 5-6 years later got osbessions.  Never compulsions. Not depressed or despairing anymore. Thinking of 12 hour a week job and needs it to get out of the house.  Needs stimulation and activity.  Son [name] Cancer doing well with promotion at [employer] 20 people. Surprisingly well. Still has compulsions including night  eating.  Married since 2016.   Hasn't travelled lately but wants to.  Enjoys [place]. [place].  Has a dog.  Past Psychiatric Medication Trials:  Clomipramine,  Poor response SSRIs  Trazodone 100 NR Seroquel 75 hangover  Review of Systems:  Review of Systems  Constitutional: Positive for fatigue. Negative for appetite change and unexpected weight change.       Lost 10-12 # in 3 weeks  Respiratory: Negative for shortness of breath.   Gastrointestinal: Negative for abdominal pain.  Neurological: Negative for dizziness, tremors, weakness and headaches.  Psychiatric/Behavioral: Positive for depression. Negative for agitation, behavioral problems, confusion, decreased concentration, dysphoric mood, hallucinations, self-injury, sleep disturbance and suicidal ideas. The patient is nervous/anxious. The patient is  not hyperactive.     Medications: I have reviewed the patient's current medications.  Current Outpatient Medications  Medication Sig Dispense Refill  . atenolol (TENORMIN) 25 MG tablet Take 1 tablet Daily for BP 90 tablet 3  . Calcium 1500 MG tablet Take 1,500 mg by mouth daily.     . clomiPRAMINE (ANAFRANIL) 25 MG capsule 1 at night for 10 days then 2 each night (Patient taking differently: Take 50 mg by mouth daily. ) 60 capsule 0  . clomiPRAMINE (ANAFRANIL) 50 MG capsule Take 1 capsule (50 mg total) by mouth at bedtime. 90 capsule 1  . clonazePAM (KLONOPIN) 2 MG tablet Take 1 tablet (2 mg total) by mouth at bedtime. 90 tablet 1  . ezetimibe (ZETIA) 10 MG tablet Take 1 tablet Daily for Cholesterol 90 tablet 3  . fish oil-omega-3 fatty acids 1000 MG capsule Take 1 g by mouth daily.    . magnesium 30 MG tablet Take 30 mg by mouth daily.     . Multiple Vitamin (MULTIVITAMIN) tablet Take 1 tablet by mouth daily.    . Multiple Vitamins-Minerals (EYE VITAMINS PO) Take 1 capsule by mouth daily. Ocusight Vitamins    . omeprazole (PRILOSEC) 40 MG capsule Take 1 capsule (40 mg total)  by mouth daily. 30 capsule 11  . QUEtiapine (SEROQUEL) 25 MG tablet Take 3 tablets (75 mg total) by mouth at bedtime. (Patient taking differently: Take 25 mg by mouth at bedtime. ) 270 tablet 0  . vitamin C (ASCORBIC ACID) 500 MG tablet Take 500 mg by mouth daily.    Marland Kitchen VITAMIN D PO Take 5,000 Units by mouth. Takes 1 to 2 capsules daily.    Marland Kitchen zinc sulfate (ZINC-220) 220 (50 Zn) MG capsule Take 220 mg by mouth daily.     No current facility-administered medications for this visit.    Medication Side Effects: None, dry  Allergies:  Allergies  Allergen Reactions  . Prednisone     Altered mental state  . Penicillins   . Asa [Aspirin] Diarrhea  . Levaquin [Levofloxacin] Other (See Comments)    Joint Pain  . Macrolides And Ketolides Nausea And Vomiting  . Paxil [Paroxetine Hcl] Nausea And Vomiting  . Tetracyclines & Related Nausea And Vomiting  . Zoloft [Sertraline Hcl] Nausea And Vomiting  . Sulfa Antibiotics Rash  . Trimethoprim Rash    Past Medical History:  Diagnosis Date  . Anemia    mild  . Anxiety   . Colon polyp    hyperplastic  . Diverticulosis   . Esophageal stricture   . GERD (gastroesophageal reflux disease)    no s/s now- off medicines  . Hyperlipidemia   . Palpitations   . Prediabetes   . Tachycardia     Family History  Problem Relation Age of Onset  . Kidney cancer Father   . Hypertension Mother   . Heart disease Mother   . Colon cancer Paternal Aunt   . Colon polyps Neg Hx   . Esophageal cancer Neg Hx   . Rectal cancer Neg Hx   . Stomach cancer Neg Hx     Social History   Socioeconomic History  . Marital status: Married    Spouse name: Not on file  . Number of children: 1  . Years of education: Not on file  . Highest education level: Not on file  Occupational History  . Occupation: Librarian  Tobacco Use  . Smoking status: Never Smoker  . Smokeless tobacco: Never Used  Vaping Use  . Vaping Use: Never used  Substance and Sexual Activity   . Alcohol use: No    Alcohol/week: 0.0 standard drinks  . Drug use: No  . Sexual activity: Not on file  Other Topics Concern  . Not on file  Social History Narrative  . Not on file   Social Determinants of Health   Financial Resource Strain:   . Difficulty of Paying Living Expenses: Not on file  Food Insecurity:   . Worried About Charity fundraiser in the Last Year: Not on file  . Ran Out of Food in the Last Year: Not on file  Transportation Needs:   . Lack of Transportation (Medical): Not on file  . Lack of Transportation (Non-Medical): Not on file  Physical Activity:   . Days of Exercise per Week: Not on file  . Minutes of Exercise per Session: Not on file  Stress:   . Feeling of Stress : Not on file  Social Connections:   . Frequency of Communication with Friends and Family: Not on file  . Frequency of Social Gatherings with Friends and Family: Not on file  . Attends Religious Services: Not on file  . Active Member of Clubs or Organizations: Not on file  . Attends Archivist Meetings: Not on file  . Marital Status: Not on file  Intimate Partner Violence:   . Fear of Current or Ex-Partner: Not on file  . Emotionally Abused: Not on file  . Physically Abused: Not on file  . Sexually Abused: Not on file    Past Medical History, Surgical history, Social history, and Family history were reviewed and updated as appropriate.   Son has severe anxiety and is treated also.  Please see review of systems for further details on the patient's review from today.   Objective:   Physical Exam:  LMP 12/09/1994   Physical Exam Constitutional:      General: She is not in acute distress.    Appearance: She is well-developed.  Musculoskeletal:        General: No deformity.  Neurological:     Mental Status: She is alert and oriented to person, place, and time.     Motor: No tremor.     Coordination: Coordination normal.     Gait: Gait normal.  Psychiatric:         Attention and Perception: She is attentive. She does not perceive auditory hallucinations.        Mood and Affect: Mood is anxious. Mood is not depressed. Affect is not labile, blunt, angry, tearful or inappropriate.        Speech: Speech normal. Speech is not slurred.        Behavior: Behavior normal.        Thought Content: Thought content normal. Thought content does not include homicidal or suicidal ideation. Thought content does not include homicidal or suicidal plan.        Cognition and Memory: Cognition normal.        Judgment: Judgment normal.     Comments: Insight intact. Residual obsessions if not busy.. No auditory or visual hallucinations. No delusions.  No rumination anymore.   Neat      Lab Review:     Component Value Date/Time   NA 141 01/20/2020 1445   K 4.1 01/20/2020 1445   CL 106 01/20/2020 1445   CO2 27 01/20/2020 1445   GLUCOSE 107 (H) 01/20/2020 1445   BUN 10 01/20/2020 1445  CREATININE 0.80 01/20/2020 1445   CALCIUM 9.6 01/20/2020 1445   PROT 6.4 01/20/2020 1445   ALBUMIN 4.0 05/13/2017 1510   AST 9 (L) 01/20/2020 1445   ALT 14 01/20/2020 1445   ALKPHOS 61 05/13/2017 1510   BILITOT 0.6 01/20/2020 1445   GFRNONAA 74 01/20/2020 1445   GFRAA 85 01/20/2020 1445       Component Value Date/Time   WBC 6.7 01/20/2020 1445   RBC 4.75 01/20/2020 1445   HGB 14.0 01/20/2020 1445   HCT 41.9 01/20/2020 1445   PLT 235 01/20/2020 1445   MCV 88.2 01/20/2020 1445   MCH 29.5 01/20/2020 1445   MCHC 33.4 01/20/2020 1445   RDW 12.5 01/20/2020 1445   LYMPHSABS 2,459 01/20/2020 1445   MONOABS 0.8 12/18/2018 1628   EOSABS 181 01/20/2020 1445   BASOSABS 40 01/20/2020 1445    No results found for: POCLITH, LITHIUM   No results found for: PHENYTOIN, PHENOBARB, VALPROATE, CBMZ   .res Assessment: Plan:    Major depressive disorder, recurrent episode, moderate (HCC)  Mixed obsessional thoughts and acts  Generalized anxiety disorder  Insomnia due to mental  condition  Tachycardia    Marked  worsening of anxiety and depresssion without obvious worsening of OCD after STOPPING CLOMIPRAMINE ACCIDENTALLY A FEW MONTHS AGO in 2020. Is improving some now. Needs more time to help fully.    Sleeping better with meds and is able to eat.  Discussed potential metabolic side effects associated with atypical antipsychotics, as well as potential risk for movement side effects. Advised pt to contact office if movement side effects occur.   We discussed the short-term risks associated with benzodiazepines including sedation and increased fall risk among others.  Discussed long-term side effect risk including dependence, potential withdrawal symptoms, and the potential eventual dose-related risk of dementia.  But recent studies from 2020 dispute this association between benzodiazepines and dementia risk. Newer studies in 2020 do not support an association with dementia. Disc her fears of addiction.  Wants to eventually consider tapering off of it using the quetiapine to protect her sleep.   Supportive therapy on relapse prevention and what to do to deal with her job and anxiety. Keep herself occupied in retirement.     Enc exercise.  Start back.  She doesn't feel capable yet but did before.  Continue clomipramine 50 mg nightly (started about 5/14) Continue Klonopin 2 mg HS Continue Seroquel 25 mg for sleep and anxiety.  No longer rumination .  Try reducing as soon as she can to help energy.  She wants to wait for change until she gets a job.  Able to stop alprazolam and still eat.  No  med changes today.   This appt was 30 mins.  FU 2-3 mos   Lynder Parents, MD, DFAPA   Please see After Visit Summary for patient specific instructions.  Future Appointments  Date Time Provider [place]  08/15/2020  3:00 PM Unk Pinto, MD [facility] None  08/20/2021  3:00 PM Unk Pinto, MD [facility] None    No orders of the defined types were  placed in this encounter.     -------------------------------

## 2020-08-13 ENCOUNTER — Encounter: Payer: Self-pay | Admitting: Internal Medicine

## 2020-08-13 NOTE — Progress Notes (Signed)
Annual Screening/Preventative Visit & Comprehensive Evaluation &  Examination     This very nice 74 y.o. MWF presents for a Screening /Preventative Visit & comprehensive evaluation and management of multiple medical co-morbidities.  Patient has been followed for HTN, HLD, Prediabetes  and Vitamin D Deficiency. Patient has GERD controlled with her Omeprazole.      Patient has been followed long term by Psychiatrist, Dr Clovis Pu for a Major Depressive Disorder,  OCD with mixed obsessional thoughts & acts, Generalized Anxiety Disorder and Insomnias.       Labile HTN predates circa 1990's. Patient's BP has been controlled on low dose Atenolol at home and patient denies any cardiac symptoms as chest pain, palpitations, shortness of breath, dizziness or ankle swelling. Today's BP is at goal - 117/76      Patient's hyperlipidemia is controlled with diet and medications. Patient denies myalgias or other medication SE's. Last lipids were at goal albeit elevated Trig's:  Lab Results  Component Value Date   CHOL 188 01/20/2020   HDL 66 01/20/2020   LDLCALC 91 01/20/2020   TRIG 217 (H) 01/20/2020   CHOLHDL 2.8 01/20/2020       Patient has hx/o prediabetes (A1c 5.8% / 2011)  and patient denies reactive hypoglycemic symptoms, visual blurring, diabetic polys or paresthesias. Last A1c was Normal & at goal:  Lab Results  Component Value Date   HGBA1C 5.4 01/20/2020       Finally, patient has history of Vitamin D Deficiency ("36" / 2008)  and last Vitamin D was low (goal 70-100):  Lab Results  Component Value Date   VD25OH 43 01/20/2020    Current Outpatient Medications on File Prior to Visit  Medication Sig  . atenolol (TENORMIN) 25 MG tablet Take 1 tablet Daily for BP  . Calcium 1500 MG tablet Take 1,500 mg by mouth daily.   . clomiPRAMINE (ANAFRANIL) 50 MG capsule Take 1 capsule (50 mg total) by mouth at bedtime.  . clonazePAM (KLONOPIN) 2 MG tablet Take 1 tablet (2 mg total) by mouth at  bedtime.  Marland Kitchen ezetimibe (ZETIA) 10 MG tablet Take 1 tablet Daily for Cholesterol  . fish oil-omega-3 fatty acids 1000 MG capsule Take 1 g by mouth daily.  . magnesium 30 MG tablet Take 30 mg by mouth daily.   . Multiple Vitamin (MULTIVITAMIN) tablet Take 1 tablet by mouth daily.  . Multiple Vitamins-Minerals (EYE VITAMINS PO) Take 1 capsule by mouth daily. [employer]cusight Vitamins  . omeprazole (PRILOSEC) 40 MG capsule Take 1 capsule (40 mg total) by mouth daily.  . QUEtiapine (SEROQUEL) 25 MG tablet Take 3 tablets (75 mg total) by mouth at bedtime. (Patient taking differently: Take 25 mg by mouth at bedtime. )  . vitamin C (ASCORBIC ACID) 500 MG tablet Take 500 mg by mouth daily.  Marland Kitchen VITAMIN D PO Take 5,000 Units by mouth. Takes 1 to 2 capsules daily.  . clomiPRAMINE (ANAFRANIL) 25 MG capsule 1 at night for 10 days then 2 each night (Patient not taking: Reported on 08/15/2020)  . zinc sulfate (ZINC-220) 220 (50 Zn) MG capsule Take 220 mg by mouth daily. (Patient not taking: Reported on 08/15/2020)   No current facility-administered medications on file prior to visit.   Allergies  Allergen Reactions  . Prednisone     Altered mental state  . Penicillins   . Asa [Aspirin] Diarrhea  . Levaquin [Levofloxacin] Other (See Comments)    Joint Pain  . Macrolides And Ketolides Nausea And Vomiting  .  Paxil [Paroxetine Hcl] Nausea And Vomiting  . Tetracyclines & Related Nausea And Vomiting  . Zoloft [Sertraline Hcl] Nausea And Vomiting  . Sulfa Antibiotics Rash  . Trimethoprim Rash   Past Medical History:  Diagnosis Date  . Anemia    mild  . Anxiety   . Colon polyp    hyperplastic  . Diverticulosis   . Esophageal stricture   . GERD (gastroesophageal reflux disease)    no s/s now- off medicines  . Hyperlipidemia   . Palpitations   . Prediabetes   . Tachycardia    Health Maintenance  Topic Date Due  . Hepatitis C Screening  Never done  . COVID-19 Vaccine (1) Never done  . MAMMOGRAM  07/08/2020   . INFLUENZA VACCINE  07/09/2020  . COLONOSCOPY  03/05/2021  . TETANUS/TDAP  12/04/2027  . DEXA SCAN  Completed  . PNA vac Low Risk Adult  Completed   Immunization History  Administered Date(s) Administered  . Influenza, High Dose Seasonal PF 11/14/2014, 10/10/2016, 12/03/2017, 11/03/2018  . Pneumococcal Conjugate-13 09/21/2015  . Pneumococcal Polysaccharide-23 02/14/2009, 05/13/2017  . Td 12/11/2006, 12/03/2017  . Tdap 12/09/2005  . Zoster 02/27/2011    Last Colon - 03/05/2016 - Dr Henrene Pastor - Recc 5 yr f/u - due Apr 2022  EGD -   02/04/2019 - Dr Henrene Pastor - Esophageal stricture - dilated  Last MGM - 07/09/2019 - [facility] - recc 1 yr f/u - due 08/08/2020 - overdue  Past Surgical History:  Procedure Laterality Date  . BREAST BIOPSY Left   . CESAREAN SECTION     x1  . COLONOSCOPY  2017  . eye lift    . POLYPECTOMY    . TONSILLECTOMY    . UPPER GASTROINTESTINAL ENDOSCOPY     Family History  Problem Relation Age of Onset  . Kidney cancer Father   . Hypertension Mother   . Heart disease Mother   . Colon cancer Paternal Aunt   . Colon polyps Neg Hx   . Esophageal cancer Neg Hx   . Rectal cancer Neg Hx   . Stomach cancer Neg Hx    Social History   Tobacco Use  . Smoking status: Never Smoker  . Smokeless tobacco: Never Used  Vaping Use  . Vaping Use: Never used  Substance Use Topics  . Alcohol use: No    Alcohol/week: 0.0 standard drinks  . Drug use: No    ROS Constitutional: Denies fever, chills, weight loss/gain, headaches, insomnia,  night sweats, and change in appetite. Does c/o fatigue. Eyes: Denies redness, blurred vision, diplopia, discharge, itchy, watery eyes.  ENT: Denies discharge, congestion, post nasal drip, epistaxis, sore throat, earache, hearing loss, dental pain, Tinnitus, Vertigo, Sinus pain, snoring.  Cardio: Denies chest pain, palpitations, irregular heartbeat, syncope, dyspnea, diaphoresis, orthopnea, PND, claudication, edema Respiratory: denies cough,  dyspnea, DOE, pleurisy, hoarseness, laryngitis, wheezing.  Gastrointestinal: Denies dysphagia, heartburn, reflux, water brash, pain, cramps, nausea, vomiting, bloating, diarrhea, constipation, hematemesis, melena, hematochezia, jaundice, hemorrhoids Genitourinary: Denies dysuria, frequency, urgency, nocturia, hesitancy, discharge, hematuria, flank pain Breast: Breast lumps, nipple discharge, bleeding.  Musculoskeletal: Denies arthralgia, myalgia, stiffness, Jt. Swelling, pain, limp, and strain/sprain. Denies falls. Skin: Denies puritis, rash, hives, warts, acne, eczema, changing in skin lesion Neuro: No weakness, tremor, incoordination, spasms, paresthesia, pain Psychiatric: Denies confusion, memory loss, sensory loss. Denies Depression. Endocrine: Denies change in weight, skin, hair change, nocturia, and paresthesia, diabetic polys, visual blurring, hyper / hypo glycemic episodes.  Heme/Lymph: No excessive bleeding, bruising, enlarged lymph nodes.  Physical Exam  BP 117/76   Pulse 64   Temp (!) 97.3 F (36.3 C)   Ht 5\' 6"  (1.676 m)   Wt 146 lb (66.2 kg)   LMP 12/09/1994   SpO2 94%   BMI 23.57 kg/m   General Appearance: Well nourished, well groomed and in no apparent distress.  Eyes: PERRLA, EOMs, conjunctiva no swelling or erythema, normal fundi and vessels. Sinuses: No frontal/maxillary tenderness ENT/Mouth: EACs patent / TMs  nl. Nares clear without erythema, swelling, mucoid exudates. Oral hygiene is good. No erythema, swelling, or exudate. Tongue normal, non-obstructing. Tonsils not swollen or erythematous. Hearing normal.  Neck: Supple, thyroid not palpable. No bruits, nodes or JVD. Respiratory: Respiratory effort normal.  BS equal and clear bilateral without rales, rhonci, wheezing or stridor. Cardio: Heart sounds are normal with regular rate and rhythm and no murmurs, rubs or gallops. Peripheral pulses are normal and equal bilaterally without edema. No aortic or femoral  bruits. Chest: symmetric with normal excursions and percussion. Breasts: Symmetric, without lumps, nipple discharge, retractions, or fibrocystic changes.  Abdomen: Flat, soft with bowel sounds active. Nontender, no guarding, rebound, hernias, masses, or organomegaly.  Lymphatics: Non tender without lymphadenopathy.  Genitourinary:  Musculoskeletal: Full ROM all peripheral extremities, joint stability, 5/5 strength, and normal gait. Skin: Warm and dry without rashes, lesions, cyanosis, clubbing or  ecchymosis.  Neuro: Cranial nerves intact, reflexes equal bilaterally. Normal muscle tone, no cerebellar symptoms. Sensation intact.  Pysch: Alert and oriented X 3, normal affect, Insight and Judgment appropriate.   Assessment and Plan  1. Annual Preventative Screening Examination   2. Labile hypertension  - EKG 12-Lead - Urinalysis, Routine w reflex microscopic - Microalbumin / creatinine urine ratio - CBC with Differential/Platelet - COMPLETE METABOLIC PANEL WITH GFR - Magnesium - TSH  3. Hyperlipidemia, mixed  - Lipid panel - TSH  4. Abnormal glucose  - EKG 12-Lead - Hemoglobin A1c - Insulin, random  5. Vitamin D deficiency  - VITAMIN D 25 Hydroxy  6. Gastroesophageal reflux disease  - CBC with Differential/Platelet  7. Prediabetes  - EKG 12-Lead - Hemoglobin A1c - Insulin, random  8. Screening for colorectal cancer  - POC Hemoccult Bld/Stl   9. Screening for ischemic heart disease  - EKG 12-Lead  10. FHx: heart disease  - EKG 12-Lead  11. Medication management  - Urinalysis, Routine w reflex microscopic - Microalbumin / creatinine urine ratio - CBC with Differential/Platelet - COMPLETE METABOLIC PANEL WITH GFR - Magnesium - Lipid panel - TSH - Hemoglobin A1c - Insulin, random - VITAMIN D 25 Hydroxyl          Patient was counseled in prudent diet to achieve/maintain BMI less than 25 for weight control, BP monitoring, regular exercise and  medications. Discussed med's effects and SE's. Screening labs and tests as requested with regular follow-up as recommended. Over 40 minutes of exam, counseling, chart review and high complex critical decision making was performed.   Kirtland Bouchard, MD

## 2020-08-13 NOTE — Patient Instructions (Signed)

## 2020-08-15 ENCOUNTER — Other Ambulatory Visit: Payer: Self-pay

## 2020-08-15 ENCOUNTER — Ambulatory Visit: Payer: Medicare Other | Admitting: Internal Medicine

## 2020-08-15 ENCOUNTER — Encounter: Payer: Self-pay | Admitting: Internal Medicine

## 2020-08-15 VITALS — BP 117/76 | HR 64 | Temp 97.3°F | Ht 66.0 in | Wt 146.0 lb

## 2020-08-15 DIAGNOSIS — Z8249 Family history of ischemic heart disease and other diseases of the circulatory system: Secondary | ICD-10-CM

## 2020-08-15 DIAGNOSIS — E559 Vitamin D deficiency, unspecified: Secondary | ICD-10-CM | POA: Diagnosis not present

## 2020-08-15 DIAGNOSIS — Z79899 Other long term (current) drug therapy: Secondary | ICD-10-CM

## 2020-08-15 DIAGNOSIS — R7309 Other abnormal glucose: Secondary | ICD-10-CM | POA: Diagnosis not present

## 2020-08-15 DIAGNOSIS — E782 Mixed hyperlipidemia: Secondary | ICD-10-CM

## 2020-08-15 DIAGNOSIS — R7303 Prediabetes: Secondary | ICD-10-CM

## 2020-08-15 DIAGNOSIS — Z1211 Encounter for screening for malignant neoplasm of colon: Secondary | ICD-10-CM

## 2020-08-15 DIAGNOSIS — R0989 Other specified symptoms and signs involving the circulatory and respiratory systems: Secondary | ICD-10-CM

## 2020-08-15 DIAGNOSIS — Z136 Encounter for screening for cardiovascular disorders: Secondary | ICD-10-CM

## 2020-08-15 DIAGNOSIS — Z0000 Encounter for general adult medical examination without abnormal findings: Secondary | ICD-10-CM

## 2020-08-15 DIAGNOSIS — K2100 Gastro-esophageal reflux disease with esophagitis, without bleeding: Secondary | ICD-10-CM | POA: Diagnosis not present

## 2020-08-15 DIAGNOSIS — Z0001 Encounter for general adult medical examination with abnormal findings: Secondary | ICD-10-CM

## 2020-08-15 DIAGNOSIS — I10 Essential (primary) hypertension: Secondary | ICD-10-CM

## 2020-08-15 DIAGNOSIS — L659 Nonscarring hair loss, unspecified: Secondary | ICD-10-CM

## 2020-08-16 LAB — CBC WITH DIFFERENTIAL/PLATELET
Absolute Monocytes: 504 cells/uL (ref 200–950)
Basophils Absolute: 28 cells/uL (ref 0–200)
Basophils Relative: 0.4 %
Eosinophils Absolute: 179 cells/uL (ref 15–500)
Eosinophils Relative: 2.6 %
HCT: 43.7 % (ref 35.0–45.0)
Hemoglobin: 14.2 g/dL (ref 11.7–15.5)
Lymphs Abs: 2139 cells/uL (ref 850–3900)
MCH: 29.4 pg (ref 27.0–33.0)
MCHC: 32.5 g/dL (ref 32.0–36.0)
MCV: 90.5 fL (ref 80.0–100.0)
MPV: 10 fL (ref 7.5–12.5)
Monocytes Relative: 7.3 %
Neutro Abs: 4050 cells/uL (ref 1500–7800)
Neutrophils Relative %: 58.7 %
Platelets: 219 10*3/uL (ref 140–400)
RBC: 4.83 10*6/uL (ref 3.80–5.10)
RDW: 12.9 % (ref 11.0–15.0)
Total Lymphocyte: 31 %
WBC: 6.9 10*3/uL (ref 3.8–10.8)

## 2020-08-16 LAB — LIPID PANEL
Cholesterol: 195 mg/dL (ref <200)
HDL: 70 mg/dL (ref >=50)
LDL Cholesterol (Calc): 101 mg/dL (calc) — ABNORMAL HIGH
Non-HDL Cholesterol (Calc): 125 mg/dL (calc) (ref <130)
Total CHOL/HDL Ratio: 2.8 (calc) (ref <5.0)
Triglycerides: 137 mg/dL (ref <150)

## 2020-08-16 LAB — URINALYSIS, ROUTINE W REFLEX MICROSCOPIC
Bacteria, UA: NONE SEEN /HPF
Bilirubin Urine: NEGATIVE
Glucose, UA: NEGATIVE
Hgb urine dipstick: NEGATIVE
Hyaline Cast: NONE SEEN /LPF
Ketones, ur: NEGATIVE
Nitrite: NEGATIVE
Protein, ur: NEGATIVE
Specific Gravity, Urine: 1.014 (ref 1.001–1.03)
Squamous Epithelial / HPF: NONE SEEN /HPF (ref <=5)
WBC, UA: NONE SEEN /HPF (ref 0–5)
pH: 7.5 (ref 5.0–8.0)

## 2020-08-16 LAB — COMPLETE METABOLIC PANEL WITH GFR
AG Ratio: 1.7 (calc) (ref 1.0–2.5)
ALT: 12 U/L (ref 6–29)
AST: 9 U/L — ABNORMAL LOW (ref 10–35)
Albumin: 4.3 g/dL (ref 3.6–5.1)
Alkaline phosphatase (APISO): 78 U/L (ref 37–153)
BUN: 11 mg/dL (ref 7–25)
CO2: 27 mmol/L (ref 20–32)
Calcium: 9.6 mg/dL (ref 8.6–10.4)
Chloride: 103 mmol/L (ref 98–110)
Creat: 0.84 mg/dL (ref 0.60–0.93)
GFR, Est African American: 80 mL/min/{1.73_m2} (ref >=60)
GFR, Est Non African American: 69 mL/min/{1.73_m2} (ref >=60)
Globulin: 2.5 g/dL (calc) (ref 1.9–3.7)
Glucose, Bld: 133 mg/dL — ABNORMAL HIGH (ref 65–99)
Potassium: 4 mmol/L (ref 3.5–5.3)
Sodium: 139 mmol/L (ref 135–146)
Total Bilirubin: 0.6 mg/dL (ref 0.2–1.2)
Total Protein: 6.8 g/dL (ref 6.1–8.1)

## 2020-08-16 LAB — IRON, TOTAL/TOTAL IRON BINDING CAP
%SAT: 20 % (calc) (ref 16–45)
Iron: 69 ug/dL (ref 45–160)
TIBC: 339 mcg/dL (calc) (ref 250–450)

## 2020-08-16 LAB — MICROALBUMIN / CREATININE URINE RATIO
Creatinine, Urine: 95 mg/dL (ref 20–275)
Microalb Creat Ratio: 7 mcg/mg creat (ref <30)
Microalb, Ur: 0.7 mg/dL

## 2020-08-16 LAB — HEMOGLOBIN A1C
Hgb A1c MFr Bld: 5.2 % of total Hgb (ref <5.7)
Mean Plasma Glucose: 103 (calc)
eAG (mmol/L): 5.7 (calc)

## 2020-08-16 LAB — TSH: TSH: 1.92 mIU/L (ref 0.40–4.50)

## 2020-08-16 LAB — ZINC: Zinc: 64 ug/dL (ref 60–130)

## 2020-08-16 LAB — MAGNESIUM: Magnesium: 2.2 mg/dL (ref 1.5–2.5)

## 2020-08-16 LAB — VITAMIN D 25 HYDROXY (VIT D DEFICIENCY, FRACTURES): Vit D, 25-Hydroxy: 58 ng/mL (ref 30–100)

## 2020-08-16 LAB — INSULIN, RANDOM: Insulin: 19.2 u[IU]/mL

## 2020-08-16 NOTE — Progress Notes (Signed)
========================================================== -   Test results slightly outside the reference range are not unusual. If there is anything important, I will review this with you,  otherwise it is considered normal test values.  If you have further questions,  please do not hesitate to contact me at the office or via My Chart.  ==========================================================  -  Zinc level is at the low end of Normal, So..............  - recommend that you restart your Zinc  ==========================================================  -  Iron levels Normal & OK  ==========================================================  -  Total Chol = 195 - sl elevated  (Ideal or Goal is less than 180)  - and   - LDL Chol; 101 also slightly elevated  (Ideal or goal is less than 70  !  )   - So.....................  - Recommend a stricter low cholesterol diet   - Cholesterol only comes from animal sources  - ie. meat, dairy, egg yolks  - Eat all the vegetables you want.  - Avoid meat, especially red meat - Beef AND Pork .  - Avoid cheese & dairy - milk & ice cream.     - Cheese is the most concentrated form of trans-fats which  is the worst thing to clog up our arteries.   - Veggie cheese is OK which can be found in the fresh  produce section at [employer] or [employer] or [employer] ==========================================================  -  A1c - Normal - Great - No Diabetes ==========================================================  -  Vitamin D = 58 - sl Low   - Vitamin D goal is between 70-100.  @@@@@@@@@@@@@@@@@@@@@@@@@@@@@@@@@   - Recommend increase your Vitamin D up to 10 caps /week. (ie,   take 2 capsules 3 x /week on MWF and 1 cap the other 4 days  [phone]  - It is very important as a natural anti-inflammatory and helping the  immune system protect against viral infections, like the Covid-19    helping hair,  skin, and nails, as well as reducing stroke and  heart attack risk.   - It helps your bones and helps with mood.  - It also decreases numerous cancer risks so please take  it as directed.   - Low Vit D is associated with a 200-300% higher risk for CANCER   and 200-300% higher risk for HEART   ATTACK  &  STROKE.    - It is also associated with higher death rate at younger ages,   autoimmune diseases like Rheumatoid arthritis, Lupus,  Multiple Sclerosis.     - Also many other serious conditions, like depression, Alzheimer's  Dementia, infertility, muscle aches, fatigue, fibromyalgia - just to name a few.  ==========================================================  - All Else - CBC - Kidneys - Electrolytes - Liver - Magnesium & Thyroid    - all  Normal / OK ====================================================   - Keep up the [place] Work  ! ==========================================================

## 2020-09-04 ENCOUNTER — Other Ambulatory Visit: Payer: Self-pay | Admitting: Psychiatry

## 2020-09-04 DIAGNOSIS — F422 Mixed obsessional thoughts and acts: Secondary | ICD-10-CM

## 2020-09-04 DIAGNOSIS — F331 Major depressive disorder, recurrent, moderate: Secondary | ICD-10-CM

## 2020-09-04 DIAGNOSIS — F411 Generalized anxiety disorder: Secondary | ICD-10-CM

## 2020-11-13 ENCOUNTER — Encounter: Payer: Self-pay | Admitting: Psychiatry

## 2020-11-13 ENCOUNTER — Other Ambulatory Visit: Payer: Self-pay

## 2020-11-13 ENCOUNTER — Ambulatory Visit (INDEPENDENT_AMBULATORY_CARE_PROVIDER_SITE_OTHER): Payer: Medicare PPO | Admitting: Psychiatry

## 2020-11-13 DIAGNOSIS — F411 Generalized anxiety disorder: Secondary | ICD-10-CM

## 2020-11-13 DIAGNOSIS — F422 Mixed obsessional thoughts and acts: Secondary | ICD-10-CM

## 2020-11-13 DIAGNOSIS — F5105 Insomnia due to other mental disorder: Secondary | ICD-10-CM

## 2020-11-13 DIAGNOSIS — F331 Major depressive disorder, recurrent, moderate: Secondary | ICD-10-CM

## 2020-11-13 DIAGNOSIS — F3342 Major depressive disorder, recurrent, in full remission: Secondary | ICD-10-CM | POA: Diagnosis not present

## 2020-11-13 MED ORDER — CLOMIPRAMINE HCL 50 MG PO CAPS: 50.0000 mg | ORAL_CAPSULE | Freq: Every day | ORAL | 1 refills | Status: DC

## 2020-11-13 MED ORDER — CLONAZEPAM 2 MG PO TABS: 2.0000 mg | ORAL_TABLET | Freq: Every day | ORAL | 1 refills | Status: DC

## 2020-11-13 NOTE — Progress Notes (Signed)
Donna Dillon [id number] 06-30-1946 74 y.o.  Subjective:   Patient ID:  Donna Dillon is a 74 y.o. (DOB [birth year]) female.  Chief Complaint:  Chief Complaint  Patient presents with  . Follow-up  . Anxiety    Depression        Associated symptoms include fatigue.  Associated symptoms include no decreased concentration, no appetite change, no headaches and no suicidal ideas.  Donna Dillon presents to the office today for follow-up of OCD.    seen December 2020.  No meds were changed.  Last seen Apr 10, 2020.  The following is noted: She is markedly worse. "falling apart".  More fatigued since dilation of esophagus.  January more dry eyes.  Hair falling out.  TSH is normal.   More sleep problems since Feb. Called and increased clonazepam to 2 mg HS bc also the insomnia was causing more anxiety.  Now also scared of taking the clonazepam.   Added trazodone 100 mg and it didn't help sleep. More obsessed with heart rate being elevated to 100 bpm.   Can't work DT anxiety and insomnia and fear over her heart. Scared.  She was ruminating. Plan:Increase atenolol for anxiety and tachycardia to 25 mg daily. DC trazodone Seroquel 25-50 mg for sleep Needs increased BZ .  She is afraid increasing the clonazepam but may be willing to take Xanax during the day.  Stay out of work this week. Disc option of Day treatment bc severity of sx and she's afraid of being alone. Option call Behavioral Health Assessment at the main [facility] phone.  Tell them I referred you to the Day treatment program. Return to office in 4 days.  04/14/2020 appointment the following is noted: Took 37.5 mg Seroquel Monday with 2 mg Klonopin and awoke in 3 hour. Afterwards usually took Seroquel 50 mg since then with Klonopin.  Lost another 1# and total of 11#/3 weeks.  Eating after taking alprazolam.  Can't quilt or read or focus on TV.  Heart rate is better after increase atenolol to 25 mg daily. Now 5-6 hours of  sleep. [name]. Plan: Start clomipramine 25 mg 1 capsule for 10 days, then 2 each night or 50 mg nightly. Continue Klonopin 2 mg HS Increase Seroquel 75 mg for sleep and anxiety and rumination Needs increased BZ .  She is afraid increasing the clonazepam but may be willing to take Xanax during the day.  Ok continue Xanax daytime for anxiety and allow her to eat..  Stay out of work right now.  Is on medical leave. Disc option of Day treatment bc severity of sx and she's afraid of being alone. Option call Behavioral Health Assessment at the main [facility] phone.  Tell them I referred you to the Day treatment program.  04/21/2020 urgent appointment, the following is noted: Generally sleeping better with quetiapine 75 mg HS.  Getting hangover.  Has to take Xanax to eat.  Losing weight 10#.  Just wants to lay down.  Feels shakey and weak. Did get restarted on clomipramine 25 mg HS.  Fatigue and hard to concentrate.   Plan: Continue medical leave until May 29, 2020.   The following med decisions were made: Continue clomipramine 25 mg 1 capsule for 10 days, then 2 each night or 50 mg nightly. Continue Klonopin 2 mg HS Increase Seroquel 75 mg for sleep and anxiety and rumination   05/03/2020 appointment urgently with the following noted:  Doesn't  feel better yet.  Seroquel 75 gave her hangover.  With 50 mg usually 8 hours but some EMA esp if anxiety is bad.  Lays in bed until noon bc doesn't want to do anything  In bed to escape. When first started clomipramine 25 had sig insomnia.  At 50 mg now Appetite still poor and lost 10#.  Forcing food for 6 weeks. Idleness is not good for worry but can't focus well enough to read as much as she would usually otherwise bc anxiety interferes. Taking alprazolam 0.5 mg in pm to help eat dinner.  Getting from [place]. No med changes  05/18/20 appt with the following noted:  Another urgent appt DT severity of  anxiety. On clomipramine 50 mg since about 5/14. Appetite not better yet.  Lost total 12-13#.   Not depressed. Feels sleepy.  Always tired.  Wonders if there's medical cause. Sleeping a lot with Seroquel 50 mg HS. Notices less anxiety by "a little".   A little more obsessions since being at home but not severe. Not currently physically able to do the job and embarrassed about being out of work.  Disc concerns about her age and work and wondering about retirement from it fully.  No med changes.  06/07/2020 appointment with the following noted: Sleeping well with clonazepam 2 mg HS and quetiapine 50 mg HS. Still on clomipramine 50 since 04/21/20. Still exhausted from going to grocery store.  Not mentally exhausted but is physically.   Has remained on medical leave but not covered by FMLA bc not working FT. Decided to resign.  Doesn't feel able to do it yet.  Hopes to get to volunteer in the fall. Naps a good deal. Plan: Continue clomipramine 50 mg nightly (started about 5/14) Continue Klonopin 2 mg HS Continue Seroquel 25-50 mg for sleep and anxiety and rumination .  Try reducing as soon as she can to help energy. Ok continue Xanax daytime for anxiety and allow her to eat..  Meds won't work if you don't get enough protein.  08/10/20 appt with the following noted:  Nothing to do but gave job up bc didn't think she could do it physically. Did clean her house well.   Clomipramine has kicked in but hard time bc nothing to divert her attention.   No anxiety until married. After 5-6 years later got osbessions.  Never compulsions. Not depressed or despairing anymore. Thinking of 12 hour a week job and needs it to get out of the house.  Needs stimulation and activity. Plan: no med changes.  11/13/2020 appointment with the following noted: Stopped quetiapine a week ago.  Sleep is OK with a little more awakening but not severe.    Son [name] Cancer doing well with promotion at [employer] 20  people. Surprisingly well. Still has compulsions including night eating.  Married since 2016.   Hasn't travelled lately but wants to.  Enjoys [place]. [place].  Has a dog.  Past Psychiatric Medication Trials:  Clomipramine,  Poor response SSRIs  Trazodone 100 NR Seroquel 75 hangover  Review of Systems:  Review of Systems  Constitutional: Positive for fatigue. Negative for appetite change and unexpected weight change.       Lost 10-12 # in 3 weeks  Respiratory: Negative for shortness of breath.   Gastrointestinal: Negative for abdominal pain.  Neurological: Negative for dizziness, tremors, weakness and headaches.  Psychiatric/Behavioral: Positive for depression. Negative for agitation, behavioral problems, confusion, decreased concentration, dysphoric mood, hallucinations, self-injury, sleep disturbance and  suicidal ideas. The patient is nervous/anxious. The patient is not hyperactive.     Medications: I have reviewed the patient's current medications.  Current Outpatient Medications  Medication Sig Dispense Refill  . atenolol (TENORMIN) 25 MG tablet Take 1 tablet Daily for BP 90 tablet 3  . Calcium 1500 MG tablet Take 1,500 mg by mouth daily.     . clomiPRAMINE (ANAFRANIL) 25 MG capsule TAKE 2 CAPSULES AT BEDTIME. 60 capsule 5  . clomiPRAMINE (ANAFRANIL) 50 MG capsule Take 1 capsule (50 mg total) by mouth at bedtime. 90 capsule 1  . clonazePAM (KLONOPIN) 2 MG tablet Take 1 tablet (2 mg total) by mouth at bedtime. 90 tablet 1  . ezetimibe (ZETIA) 10 MG tablet Take 1 tablet Daily for Cholesterol 90 tablet 3  . fish oil-omega-3 fatty acids 1000 MG capsule Take 1 g by mouth daily.    . magnesium 30 MG tablet Take 30 mg by mouth daily.     . Multiple Vitamin (MULTIVITAMIN) tablet Take 1 tablet by mouth daily.    . Multiple Vitamins-Minerals (EYE VITAMINS PO) Take 1 capsule by mouth daily. Ocusight Vitamins    . omeprazole (PRILOSEC) 40 MG capsule Take 1 capsule (40 mg total) by  mouth daily. 30 capsule 11  . QUEtiapine (SEROQUEL) 25 MG tablet Take 3 tablets (75 mg total) by mouth at bedtime. (Patient taking differently: Take 25 mg by mouth at bedtime. Stopped Monday) 270 tablet 0  . vitamin C (ASCORBIC ACID) 500 MG tablet Take 500 mg by mouth daily.    Marland Kitchen VITAMIN D PO Take 5,000 Units by mouth. Takes 1 to 2 capsules daily.    Marland Kitchen zinc sulfate (ZINC-220) 220 (50 Zn) MG capsule Take 220 mg by mouth daily.      No current facility-administered medications for this visit.    Medication Side Effects: None, dry  Allergies:  Allergies  Allergen Reactions  . Prednisone     Altered mental state  . Penicillins   . Asa [Aspirin] Diarrhea  . Levaquin [Levofloxacin] Other (See Comments)    Joint Pain  . Macrolides And Ketolides Nausea And Vomiting  . Paxil [Paroxetine Hcl] Nausea And Vomiting  . Tetracyclines & Related Nausea And Vomiting  . Zoloft [Sertraline Hcl] Nausea And Vomiting  . Sulfa Antibiotics Rash  . Trimethoprim Rash    Past Medical History:  Diagnosis Date  . Anemia    mild  . Anxiety   . Colon polyp    hyperplastic  . Diverticulosis   . Esophageal stricture   . GERD (gastroesophageal reflux disease)    no s/s now- off medicines  . Hyperlipidemia   . Palpitations   . Prediabetes   . Tachycardia     Family History  Problem Relation Age of Onset  . Kidney cancer Father   . Hypertension Mother   . Heart disease Mother   . Colon cancer Paternal Aunt   . Colon polyps Neg Hx   . Esophageal cancer Neg Hx   . Rectal cancer Neg Hx   . Stomach cancer Neg Hx     Social History   Socioeconomic History  . Marital status: Married    Spouse name: Not on file  . Number of children: 1  . Years of education: Not on file  . Highest education level: Not on file  Occupational History  . Occupation: Librarian  Tobacco Use  . Smoking status: Never Smoker  . Smokeless tobacco: Never Used  Vaping Use  .  Vaping Use: Never used  Substance and  Sexual Activity  . Alcohol use: No    Alcohol/week: 0.0 standard drinks  . Drug use: No  . Sexual activity: Not on file  Other Topics Concern  . Not on file  Social History Narrative  . Not on file   Social Determinants of Health   Financial Resource Strain:   . Difficulty of Paying Living Expenses: Not on file  Food Insecurity:   . Worried About Charity fundraiser in the Last Year: Not on file  . Ran Out of Food in the Last Year: Not on file  Transportation Needs:   . Lack of Transportation (Medical): Not on file  . Lack of Transportation (Non-Medical): Not on file  Physical Activity:   . Days of Exercise per Week: Not on file  . Minutes of Exercise per Session: Not on file  Stress:   . Feeling of Stress : Not on file  Social Connections:   . Frequency of Communication with Friends and Family: Not on file  . Frequency of Social Gatherings with Friends and Family: Not on file  . Attends Religious Services: Not on file  . Active Member of Clubs or Organizations: Not on file  . Attends Archivist Meetings: Not on file  . Marital Status: Not on file  Intimate Partner Violence:   . Fear of Current or Ex-Partner: Not on file  . Emotionally Abused: Not on file  . Physically Abused: Not on file  . Sexually Abused: Not on file    Past Medical History, Surgical history, Social history, and Family history were reviewed and updated as appropriate.   Son has severe anxiety and is treated also.  Please see review of systems for further details on the patient's review from today.   Objective:   Physical Exam:  LMP 12/09/1994   Physical Exam Constitutional:      General: She is not in acute distress.    Appearance: She is well-developed.  Musculoskeletal:        General: No deformity.  Neurological:     Mental Status: She is alert and oriented to person, place, and time.     Motor: No tremor.     Coordination: Coordination normal.     Gait: Gait normal.   Psychiatric:        Attention and Perception: She is attentive. She does not perceive auditory hallucinations.        Mood and Affect: Mood is anxious. Mood is not depressed. Affect is not labile, blunt, angry, tearful or inappropriate.        Speech: Speech normal. Speech is not slurred.        Behavior: Behavior normal.        Thought Content: Thought content normal. Thought content does not include homicidal or suicidal ideation. Thought content does not include homicidal or suicidal plan.        Cognition and Memory: Cognition normal.        Judgment: Judgment normal.     Comments: Insight intact. Residual obsessions if not busy.. No auditory or visual hallucinations. No delusions.  No rumination anymore.   Neat      Lab Review:     Component Value Date/Time   NA 139 08/15/2020 1456   K 4.0 08/15/2020 1456   CL 103 08/15/2020 1456   CO2 27 08/15/2020 1456   GLUCOSE 133 (H) 08/15/2020 1456   BUN 11 08/15/2020 1456   CREATININE 0.84 08/15/2020  1456   CALCIUM 9.6 08/15/2020 1456   PROT 6.8 08/15/2020 1456   ALBUMIN 4.0 05/13/2017 1510   AST 9 (L) 08/15/2020 1456   ALT 12 08/15/2020 1456   ALKPHOS 61 05/13/2017 1510   BILITOT 0.6 08/15/2020 1456   GFRNONAA 69 08/15/2020 1456   GFRAA 80 08/15/2020 1456       Component Value Date/Time   WBC 6.9 08/15/2020 1456   RBC 4.83 08/15/2020 1456   HGB 14.2 08/15/2020 1456   HCT 43.7 08/15/2020 1456   PLT 219 08/15/2020 1456   MCV 90.5 08/15/2020 1456   MCH 29.4 08/15/2020 1456   MCHC 32.5 08/15/2020 1456   RDW 12.9 08/15/2020 1456   LYMPHSABS 2,139 08/15/2020 1456   MONOABS 0.8 12/18/2018 1628   EOSABS 179 08/15/2020 1456   BASOSABS 28 08/15/2020 1456    No results found for: POCLITH, LITHIUM   No results found for: PHENYTOIN, PHENOBARB, VALPROATE, CBMZ   .res Assessment: Plan:    Major depression, recurrent, full remission ([place])  Mixed obsessional thoughts and acts - Plan: clomiPRAMINE (ANAFRANIL) 50 MG  capsule  Generalized anxiety disorder - Plan: clomiPRAMINE (ANAFRANIL) 50 MG capsule  Insomnia due to mental condition - Plan: clonazePAM (KLONOPIN) 2 MG tablet  Major depressive disorder, recurrent episode, moderate ([place]) - Plan: clomiPRAMINE (ANAFRANIL) 50 MG capsule    Marked  worsening of anxiety and depresssion without obvious worsening of OCD after STOPPING CLOMIPRAMINE ACCIDENTALLY A FEW MONTHS AGO in 2020. Is improving some now. Needs more time to help fully.    Sleeping better with meds and is able to eat.  OK off quetiapine so far.  But cannot sleep without clonazepam 2 mg HS.  Discussed potential metabolic side effects associated with atypical antipsychotics, as well as potential risk for movement side effects. Advised pt to contact office if movement side effects occur.   We discussed the short-term risks associated with benzodiazepines including sedation and increased fall risk among others.  Discussed long-term side effect risk including dependence, potential withdrawal symptoms, and the potential eventual dose-related risk of dementia.  But recent studies from 2020 dispute this association between benzodiazepines and dementia risk. Newer studies in 2020 do not support an association with dementia. Disc her fears of addiction.  Wants to eventually consider tapering off of it using the quetiapine to protect her sleep.   Supportive therapy on relapse prevention and what to do to deal with her job and anxiety. Keep herself occupied in retirement.  Disc this again and she's looking for volunteer position.       Enc exercise.  Start back.  She doesn't feel capable yet but did before.  Continue clomipramine 50 mg nightly (started about 5/14) Continue Klonopin 2 mg HS for now but hope to gradually reduce it with goal of about 1 mg eventually.  Unlikely to be able to stop it. Disc way to reduce it in detail. PRN Seroquel 25 mg for sleep and anxiety.  No longer rumination .    No   med changes today.   This appt was 30 mins.  FU 3-4 mos   Lynder Parents, MD, DFAPA   Please see After Visit Summary for patient specific instructions.  Future Appointments  Date Time Provider [place]  08/20/2021  3:00 PM Unk Pinto, MD [facility] None    No orders of the defined types were placed in this encounter.     -------------------------------

## 2020-12-11 ENCOUNTER — Other Ambulatory Visit: Payer: Self-pay | Admitting: *Deleted

## 2020-12-11 ENCOUNTER — Other Ambulatory Visit: Payer: Self-pay | Admitting: Internal Medicine

## 2020-12-11 DIAGNOSIS — I10 Essential (primary) hypertension: Secondary | ICD-10-CM

## 2020-12-11 DIAGNOSIS — R000 Tachycardia, unspecified: Secondary | ICD-10-CM

## 2020-12-11 MED ORDER — ATENOLOL 25 MG PO TABS: ORAL_TABLET | ORAL | 0 refills | Status: DC

## 2020-12-19 ENCOUNTER — Ambulatory Visit: Payer: Medicare PPO | Admitting: Adult Health

## 2020-12-19 ENCOUNTER — Encounter: Payer: Self-pay | Admitting: Adult Health

## 2020-12-19 ENCOUNTER — Other Ambulatory Visit: Payer: Self-pay

## 2020-12-19 VITALS — BP 106/64 | HR 78 | Temp 97.3°F | Wt 145.0 lb

## 2020-12-19 DIAGNOSIS — J439 Emphysema, unspecified: Secondary | ICD-10-CM | POA: Diagnosis not present

## 2020-12-19 DIAGNOSIS — R053 Chronic cough: Secondary | ICD-10-CM

## 2020-12-19 NOTE — Progress Notes (Signed)
cAssessment and Plan:  Donna Dillon was seen today for fatigue.  Diagnoses and all orders for this visit:  Persistent cough/fatigue Pulmonary emphysema, unspecified emphysema type ([place]) Non-toxic/well appearing patient with mild but persistent sx Her main concern with r/o pneumonia; reviewed last in 12/2018, note Exam is unremarkable  COPD/emphysematous changes on last CXR; discussed possible inhaler but declines stating mild sx and prefers not to take, ONLY wants to r/o pneumonia CXR ordered -  Encouraged to follow up if any progressive sx or persistent She might try guaifenesin at night to thin secretions -     DG Chest 2 View; Future  Further disposition pending results of labs. Discussed med's effects and SE's.   Over 30 minutes of exam, counseling, chart review, and critical decision making was performed.   Future Appointments  Date Time Provider [place]  02/15/2021  3:00 PM Liane Comber, NP [facility] None  03/15/2021  4:15 PM Cottle, Billey Co., MD [facility] None  08/20/2021  3:00 PM Unk Pinto, MD [facility] None    ------------------------------------------------------------------------------------------------------------------   HPI BP 106/64   Pulse 78   Temp (!) 97.3 F (36.3 C)   Wt 145 lb (65.8 kg)   LMP 12/09/1994   SpO2 97%   BMI 23.40 kg/m   75 y.o.female never smoker with hx of htn and GERD presents for requesting evaluation due to productive cough and fatigue.   She notes cough and fatigue; she reports has been coughing more since the fall, typically dry, mild, intermittent; has noted somewhat increased recently with secretions with very foul taste, every morning. She denies notable congestion or nasal drainage. She denies fever/chills. She does not ongoing fatigue, but this is chronic, seems worse in the last month. She just wanted to make sure not pneumonia, as has hx of severe pneumonia remotely that was similar.   Denies sense of skipping or  racing heart. She denies CP. Has some bil mid back pain.  Does have COPD/emphysematous changes on last xray 12/18/2018. Denies exertional dyspnea or at rest. Denies wheezing. She denies personal hx of smoking, did have secondary exposure as child, denies working around chemicals or dust. Denies allergies, watery/itchy eyes, sneezing, congestion or rhinitis.    Past Medical History:  Diagnosis Date  . Anemia    mild  . Anxiety   . Colon polyp    hyperplastic  . Diverticulosis   . Esophageal stricture   . GERD (gastroesophageal reflux disease)    no s/s now- off medicines  . Hyperlipidemia   . Palpitations   . Prediabetes   . Tachycardia      Allergies  Allergen Reactions  . Prednisone     Altered mental state  . Penicillins   . Asa [Aspirin] Diarrhea  . Levaquin [Levofloxacin] Other (See Comments)    Joint Pain  . Macrolides And Ketolides Nausea And Vomiting  . Paxil [Paroxetine Hcl] Nausea And Vomiting  . Tetracyclines & Related Nausea And Vomiting  . Zoloft [Sertraline Hcl] Nausea And Vomiting  . Sulfa Antibiotics Rash  . Trimethoprim Rash    Current Outpatient Medications on File Prior to Visit  Medication Sig  . atenolol (TENORMIN) 25 MG tablet Take      1 tablet       Daily       for BP  . Calcium 1500 MG tablet Take 1,500 mg by mouth daily.   . clomiPRAMINE (ANAFRANIL) 50 MG capsule Take 1 capsule (50 mg total) by mouth at bedtime.  Marland Kitchen  clonazePAM (KLONOPIN) 2 MG tablet Take 1 tablet (2 mg total) by mouth at bedtime.  Marland Kitchen ezetimibe (ZETIA) 10 MG tablet Take 1 tablet Daily for Cholesterol  . fish oil-omega-3 fatty acids 1000 MG capsule Take 1 g by mouth daily.  . magnesium 30 MG tablet Take 30 mg by mouth daily.   . Multiple Vitamin (MULTIVITAMIN) tablet Take 1 tablet by mouth daily.  . Multiple Vitamins-Minerals (EYE VITAMINS PO) Take 1 capsule by mouth daily. Ocusight Vitamins  . omeprazole (PRILOSEC) 40 MG capsule Take 1 capsule (40 mg total) by mouth daily.  .  QUEtiapine (SEROQUEL) 25 MG tablet Take 3 tablets (75 mg total) by mouth at bedtime. (Patient taking differently: Take 25 mg by mouth at bedtime. Stopped Monday)  . vitamin C (ASCORBIC ACID) 500 MG tablet Take 500 mg by mouth daily.  Marland Kitchen VITAMIN D PO Take 5,000 Units by mouth. Takes 1 to 2 capsules daily.  Marland Kitchen zinc sulfate 220 (50 Zn) MG capsule Take 220 mg by mouth daily.   . clomiPRAMINE (ANAFRANIL) 25 MG capsule TAKE 2 CAPSULES AT BEDTIME.   No current facility-administered medications on file prior to visit.    ROS: all negative except above.   Physical Exam:  BP 106/64   Pulse 78   Temp (!) 97.3 F (36.3 C)   Wt 145 lb (65.8 kg)   LMP 12/09/1994   SpO2 97%   BMI 23.40 kg/m   General Appearance: Well nourished, well dressed elderly female in no apparent distress. Eyes: PERRLA, conjunctiva no swelling or erythema Sinuses: No Frontal/maxillary tenderness ENT/Mouth: Ext aud canals clear, TMs without erythema, bulging. No erythema, swelling, or exudate on post pharynx.  Tonsils not swollen or erythematous. Hearing normal.  Neck: Supple, thyroid normal.  Respiratory: Respiratory effort normal, BS equal bilaterally without rales, rhonchi, wheezing or stridor.  Cardio: RRR with no MRGs. Brisk peripheral pulses without edema.  Abdomen: Soft, + BS.  Non tender. Lymphatics: Non tender without lymphadenopathy.  Musculoskeletal: normal gait.  Skin: Warm, dry without rashes, lesions, ecchymosis.  Neuro: Normal muscle tone Psych: Awake and oriented X 3, normal affect, Insight and Judgment appropriate.     Izora Ribas, NP 4:52 PM [facility]

## 2020-12-19 NOTE — Patient Instructions (Addendum)
[facility] for chest xray - 8am - 4 pm - M-F  No appointment needed  Can try guaifenesin (plain mucinex)     Chronic Obstructive Pulmonary Disease  Chronic obstructive pulmonary disease (COPD) is a long-term (chronic) lung problem. When you have COPD, it is hard for air to get in and out of your lungs. Usually the condition gets worse over time, and your lungs will never return to normal. There are things you can do to keep yourself as healthy as possible. What are the causes?  Smoking. This is the most common cause.  Certain genes passed from parent to child (inherited). What increases the risk?  Being exposed to secondhand smoke from cigarettes, pipes, or cigars.  Being exposed to chemicals and other irritants, such as fumes and dust in the work environment.  Having chronic lung conditions or infections. What are the signs or symptoms?  Shortness of breath, especially during physical activity.  A long-term cough with a large amount of thick mucus. Sometimes, the cough may not have any mucus (dry cough).  Wheezing.  Breathing quickly.  Skin that looks gray or blue, especially in the fingers, toes, or lips.  Feeling tired (fatigue).  Weight loss.  Chest tightness.  Having infections often.  Episodes when breathing symptoms become much worse (exacerbations). At the later stages of this disease, you may have swelling in the ankles, feet, or legs. How is this treated?  Taking medicines.  Quitting smoking, if you smoke.  Rehabilitation. This includes steps to make your body work better. It may involve a team of specialists.  Doing exercises.  Making changes to your diet.  Using oxygen.  Lung surgery.  Lung transplant.  Comfort measures (palliative care). Follow these instructions at home: Medicines  Take over-the-counter and prescription medicines only as told by your doctor.  Talk to your doctor before taking any cough  or allergy medicines. You may need to avoid medicines that cause your lungs to be dry. Lifestyle  If you smoke, stop smoking. Smoking makes the problem worse.  Do not smoke or use any products that contain nicotine or tobacco. If you need help quitting, ask your doctor.  Avoid being around things that make your breathing worse. This may include smoke, chemicals, and fumes.  Stay active, but remember to rest as well.  Learn and use tips on how to manage stress and control your breathing.  Make sure you get enough sleep. Most adults need at least 7 hours of sleep every night.  Eat healthy foods. Eat smaller meals more often. Rest before meals. Controlled breathing Learn and use tips on how to control your breathing as told by your doctor. Try:  Breathing in (inhaling) through your nose for 1 second. Then, pucker your lips and breath out (exhale) through your lips for 2 seconds.  Putting one hand on your belly (abdomen). Breathe in slowly through your nose for 1 second. Your hand on your belly should move out. Pucker your lips and breathe out slowly through your lips. Your hand on your belly should move in as you breathe out.   Controlled coughing Learn and use controlled coughing to clear mucus from your lungs. Follow these steps: 1. Lean your head a little forward. 2. Breathe in deeply. 3. Try to hold your breath for 3 seconds. 4. Keep your mouth slightly open while coughing 2 times. 5. Spit any mucus out into a tissue. 6. Rest and do the steps again  1 or 2 times as needed. General instructions  Make sure you get all the shots (vaccines) that your doctor recommends. Ask your doctor about a flu shot and a pneumonia shot.  Use oxygen therapy and pulmonary rehabilitation if told by your doctor. If you need home oxygen therapy, ask your doctor if you should buy a tool to measure your oxygen level (oximeter).  Make a COPD action plan with your doctor. This helps you to know what to do  if you feel worse than usual.  Manage any other conditions you have as told by your doctor.  Avoid going outside when it is very hot, cold, or humid.  Avoid people who have a sickness you can catch (contagious).  Keep all follow-up visits. Contact a doctor if:  You cough up more mucus than usual.  There is a change in the color or thickness of the mucus.  It is harder to breathe than usual.  Your breathing is faster than usual.  You have trouble sleeping.  You need to use your medicines more often than usual.  You have trouble doing your normal activities such as getting dressed or walking around the house. Get help right away if:  You have shortness of breath while resting.  You have shortness of breath that stops you from: ? Being able to talk. ? Doing normal activities.  Your chest hurts for longer than 5 minutes.  Your skin color is more blue than usual.  Your pulse oximeter shows that you have low oxygen for longer than 5 minutes.  You have a fever.  You feel too tired to breathe normally. These symptoms may represent a serious problem that is an emergency. Do not wait to see if the symptoms will go away. Get medical help right away. Call your local emergency services (911 in the [place] Do not drive yourself to the hospital. Summary  Chronic obstructive pulmonary disease (COPD) is a long-term lung problem.  The way your lungs work will never return to normal. Usually the condition gets worse over time. There are things you can do to keep yourself as healthy as possible.  Take over-the-counter and prescription medicines only as told by your doctor.  If you smoke, stop. Smoking makes the problem worse. This information is not intended to replace advice given to you by your health care provider. Make sure you discuss any questions you have with your health care provider. Document Revised: 10/03/2020 Document Reviewed: 10/03/2020 Elsevier Patient Education  2021  [employer].

## 2021-01-03 ENCOUNTER — Ambulatory Visit
Admission: RE | Admit: 2021-01-03 | Discharge: 2021-01-03 | Disposition: A | Discharge status: Home / Self Care | Payer: Medicare PPO | Source: Ambulatory Visit | Attending: Adult Health | Admitting: Adult Health

## 2021-01-03 ENCOUNTER — Other Ambulatory Visit: Payer: Self-pay

## 2021-01-03 DIAGNOSIS — R5383 Other fatigue: Secondary | ICD-10-CM | POA: Diagnosis not present

## 2021-01-03 DIAGNOSIS — R059 Cough, unspecified: Secondary | ICD-10-CM | POA: Diagnosis not present

## 2021-01-03 DIAGNOSIS — J449 Chronic obstructive pulmonary disease, unspecified: Secondary | ICD-10-CM | POA: Diagnosis not present

## 2021-01-03 DIAGNOSIS — R053 Chronic cough: Secondary | ICD-10-CM

## 2021-02-14 NOTE — Progress Notes (Signed)
MEDICARE ANNUAL WELLNESS VISIT AND FOLLOW UP Assessment:   Diagnoses and all orders for this visit:  Medicare annual wellness visit, subsequent Yearly  Essential hypertension / tachycardia Continue medication - atenolol  Monitor blood pressure at home; call if consistently over 130/80 Continue DASH diet.   Reminder to go to the ER if any CP, SOB, nausea, dizziness, severe HA, changes vision/speech, left arm numbness and tingling and jaw pain.  Gastroesophageal reflux disease, esophagitis presence not specified Doing well on current regiment Continue omeprazole 40mg  daily  ESOPHAGEAL STRICTURE Doing well at this time Continue to monitor symptoms Follows with Dr Henrene Pastor Q6 months   Osteopenia, unspecified location Taking Calcium and Vitamin D DEXA scan due, referral placed- schedule at [facility]  Mixed hyperlipidemia Continue medications: Zetia 10mg , fish oil 1,000mg  daily Continue low cholesterol diet and exercise.  Check lipid panel next visit  Mixed obsession thoughts and acts Doing well at this time Taking anafranil 50mg  HS Clonazepam 1mg  HS Follows with Dr Clovis Pu  Vitamin D deficiency Continue supplementation Will check level next office visit  Abnormal glucose Discussed dietary and exercise modifications  History of colon polyps Dr. Henrene Pastor follows, due colonoscopy 02/2021 - she will contact GI  BMI 22 Continue to recommend diet heavy in fruits and veggies and low in animal meats, cheeses, and dairy products, appropriate calorie intake Discuss exercise recommendations routinely Continue to monitor weight at each visit    Follow Up Instructions:    I discussed the assessment and treatment plan with the patient. The patient was provided an opportunity to ask questions and all were answered. The patient agreed with the plan and demonstrated an understanding of the instructions.   The patient was advised to call back or seek an in-person evaluation if the symptoms  worsen or if the condition fails to improve as anticipated.  I provided 30 minutes of non-face-to-face time during this encounter including counseling, chart review, and critical decision making was preformed.   Future Appointments  Date Time Provider [place]  03/15/2021  4:15 PM Cottle, Billey Co., MD [facility] None  08/20/2021  3:00 PM Unk Pinto, MD [facility] None  02/18/2022  2:30 PM Liane Comber, NP [facility] None      Plan:   During the course of the visit the patient was educated and counseled about appropriate screening and preventive services including:    Pneumococcal vaccine   Influenza vaccine  Prevnar 13  Td vaccine  Screening electrocardiogram, deferred, telephone visit [place].  Colorectal cancer screening  Diabetes screening  Glaucoma screening  Nutrition counseling    Subjective:  Donna Dillon is a 75 y.o. female who presents for Medicare Annual Wellness Visit and 3 month follow up for HTN/tachycardia, GERD, hyperlipidemia, abnormal glucose, OCD, histroy of esophageal stricture, osteopenia, and vitamin D Def.   Does have COPD/emphysematous changes on xray 12/18/2018, again in 12/2020. Not on inhalers, denies sx other than with URI. Reports post nasal drip triggers bronchitis episodes.   She has hx of colon polyps, GERD with stricture (had last dilation 11/11/2019), followed by Dr. Henrene Pastor. Last EGD 2020 was benign. She is due for colonoscopy 02/2021  She is followed by psych Dr. Clovis Pu for OCD, on anafranil and PRN klonopin.   BMI is Body mass index is 22.44 kg/m., she has been working on diet and exercise. She does walking 15-20 2-3 days a week.  Wt Readings from Last 3 Encounters:  02/15/21 139 lb (63 kg)  12/19/20 145 lb (65.8 kg)  08/15/20 146 lb (66.2 kg)   History of hypertension that predates 1990 Her blood pressure has been controlled at home, today their BP is BP: 100/60  She does workout. She denies chest pain, shortness of  breath, dizziness.   She is on cholesterol medication and denies myalgias. She is taking zetia, side effects with statins, unable to tolerate. Her cholesterol is not at goal. The cholesterol last visit was:   Lab Results  Component Value Date   CHOL 195 08/15/2020   HDL 70 08/15/2020   LDLCALC 101 (H) 08/15/2020   TRIG 137 08/15/2020   CHOLHDL 2.8 08/15/2020   She has been working on diet and exercise for hx of prediabetes (A1c 5.8%, 2011, recently well controlled after weight), and denies hyperglycemia, hypoglycemia , increased appetite, nausea, paresthesia of the feet, polydipsia, polyuria, visual disturbances and vomiting. Last A1C in the office was:  Lab Results  Component Value Date   HGBA1C 5.2 08/15/2020   Last GFR Lab Results  Component Value Date   GFRNONAA 69 08/15/2020    Patient is on Vitamin D supplement.  Deficiency noted (36/2008).  Last Vitamin D level was in normal range but below goal of 70-90. Lab Results  Component Value Date   VD25OH 58 08/15/2020       Medication Review:   Current Outpatient Medications (Cardiovascular):  .  atenolol (TENORMIN) 25 MG tablet, Take      1 tablet       Daily       for BP .  ezetimibe (ZETIA) 10 MG tablet, Take 1 tablet Daily for Cholesterol     Current Outpatient Medications (Other):  Marland Kitchen  Calcium 1500 MG tablet, Take 1,500 mg by mouth daily.  .  clomiPRAMINE (ANAFRANIL) 50 MG capsule, Take 1 capsule (50 mg total) by mouth at bedtime. .  clonazePAM (KLONOPIN) 2 MG tablet, Take 1 tablet (2 mg total) by mouth at bedtime. .  fish oil-omega-3 fatty acids 1000 MG capsule, Take 1 g by mouth daily. .  magnesium 30 MG tablet, Take 30 mg by mouth daily.  .  Multiple Vitamin (MULTIVITAMIN) tablet, Take 1 tablet by mouth daily. .  Multiple Vitamins-Minerals (EYE VITAMINS PO), Take 1 capsule by mouth daily. Ocusight Vitamins .  omeprazole (PRILOSEC) 40 MG capsule, Take 1 capsule (40 mg total) by mouth daily. .  vitamin C (ASCORBIC  ACID) 500 MG tablet, Take 500 mg by mouth daily. Marland Kitchen  VITAMIN D PO, Take 5,000 Units by mouth. Takes 1 to 2 capsules daily.  Allergies: Allergies  Allergen Reactions  . Prednisone     Altered mental state  . Penicillins   . Asa [Aspirin] Diarrhea  . Levaquin [Levofloxacin] Other (See Comments)    Joint Pain  . Macrolides And Ketolides Nausea And Vomiting  . Paxil [Paroxetine Hcl] Nausea And Vomiting  . Tetracyclines & Related Nausea And Vomiting  . Zoloft [Sertraline Hcl] Nausea And Vomiting  . Sulfa Antibiotics Rash  . Trimethoprim Rash    Current Problems (verified) has ESOPHAGEAL STRICTURE; GERD; PERSONAL HX COLONIC POLYPS; Osteopenia; Hyperlipidemia; Abnormal glucose; Vitamin D deficiency; BMI 23.0-23.9, adult; FHx: heart disease; OCD (obsessive compulsive disorder); Flying phobia; Essential hypertension; and Pulmonary emphysema (HCC) on their problem list.  Screening Tests Immunization History  Administered Date(s) Administered  . Influenza, High Dose Seasonal PF 11/14/2014, 10/10/2016, 12/03/2017, 11/03/2018  . Pneumococcal Conjugate-13 09/21/2015  . Pneumococcal Polysaccharide-23 02/14/2009, 05/13/2017  . Td 12/11/2006, 12/03/2017  . Tdap 12/09/2005  . Zoster 02/27/2011  Preventative care: Last colonoscopy: 2017, Dr Henrene Pastor, polyps, 5 year recall due 02/2021 - patient will contact EGD; 2020 Mammogram: 06/2019 - [facility], due  DEXA: 2014, osteopenia DUE - ordered to schedule Pap: 2016, DONE   Prior vaccinations: TD or Tdap: 2018  Influenza: 2019   Pneumococcal: 2018 Prevnar13: 2016 Shingles/Zostavax: 2012 Covid 19; had 3/3, pfizer, 2021- pending record   Names of Other Physician/Practitioners you currently use: 1. [facility] here for primary care 2. Eye Exam, Dr. ?, Dr. Elder Negus office, 2021 3.Dental Exam, Dr Barrie Dunker, 2021  Patient Care Team: Unk Pinto, MD as PCP - General (Internal Medicine) Clent Jacks, MD as  Consulting Physician (Ophthalmology) Irene Shipper, MD as Consulting Physician (Gastroenterology) Milus Banister, MD as Attending Physician (Gastroenterology) Megan Salon, MD as Consulting Physician (Gynecology)  Surgical: She  has a past surgical history that includes Tonsillectomy; Breast biopsy (Left); Cesarean section; Upper gastrointestinal endoscopy; Polypectomy; eye lift; and Colonoscopy (2017). Family Her family history includes Colon cancer in her paternal aunt; Heart disease in her mother; Hypertension in her mother; Kidney cancer in her father. Social history  She reports that she has never smoked. She has never used smokeless tobacco. She reports that she does not drink alcohol and does not use drugs.  MEDICARE WELLNESS OBJECTIVES: Physical activity: Current Exercise Habits: Home exercise routine, Type of exercise: walking, Time (Minutes): 15, Frequency (Times/Week): 3, Weekly Exercise (Minutes/Week): 45, Intensity: Mild, Exercise limited by: respiratory conditions(s) Cardiac risk factors: Cardiac Risk Factors include: advanced age (>45men, >59 women);dyslipidemia;hypertension Depression/mood screen:   Depression screen [facility] 2/9 02/15/2021  Decreased Interest 0  Down, Depressed, Hopeless 0  PHQ - 2 Score 0    ADLs:  In your present state of health, do you have any difficulty performing the following activities: 02/15/2021 08/13/2020  Hearing? N N  Vision? N N  Difficulty concentrating or making decisions? N N  Walking or climbing stairs? N N  Dressing or bathing? N N  Doing errands, shopping? N N  Some recent data might be hidden     Cognitive Testing  Alert? Yes  Normal Appearance?Yes  Oriented to person? Yes  Place? Yes   Time? Yes  Recall of three objects?  Yes  Can perform simple calculations? Yes  Displays appropriate judgment?Yes  Can read the correct time from a watch face?Yes  EOL planning: Does Patient Have a Medical Advance Directive?: No Would patient  like information on creating a medical advance directive?: No - Patient declined   Objective:   Today's Vitals   02/15/21 1513  BP: 100/60  Pulse: 60  Temp: (!) 97.3 F (36.3 C)  SpO2: 98%  Weight: 139 lb (63 kg)   Body mass index is 22.44 kg/m.  General Appearance: Well nourished, in no apparent distress. Eyes: conjunctiva no swelling or erythema ENT/Mouth: Mask in place; Hearing normal.  Neck: Supple, thyroid normal.  Respiratory: Respiratory effort normal, BS equal bilaterally without rales, rhonchi, wheezing or stridor.  Cardio: RRR with no MRGs. Brisk peripheral pulses without edema.  Abdomen: Soft, + BS.  Non tender, no guarding, rebound, hernias, masses. Lymphatics: Non tender without lymphadenopathy.  Musculoskeletal: No obvious deformity, normal gait.  Skin: Warm, dry without rashes, lesions, ecchymosis.  Neuro: Normal muscle tone Psych: Awake and oriented X 3, anxious affect, Insight and Judgment appropriate.    Medicare Attestation I have personally reviewed: The patient's medical and social history Their use of alcohol, tobacco or illicit drugs Their current medications  and supplements The patient's functional ability including ADLs,fall risks, home safety risks, cognitive, and hearing and visual impairment Diet and physical activities Evidence for depression or mood disorders  The patient's weight, height, BMI, and visual acuity have been recorded in the chart.  I have made referrals, counseling, and provided education to the patient based on review of the above and I have provided the patient with a written personalized care plan for preventive services.     Izora Ribas, NP 3:45 PM [facility]

## 2021-02-15 ENCOUNTER — Other Ambulatory Visit: Payer: Self-pay

## 2021-02-15 ENCOUNTER — Ambulatory Visit: Payer: Medicare PPO | Admitting: Adult Health

## 2021-02-15 ENCOUNTER — Encounter: Payer: Self-pay | Admitting: Adult Health

## 2021-02-15 VITALS — BP 100/60 | HR 60 | Temp 97.3°F | Wt 139.0 lb

## 2021-02-15 DIAGNOSIS — E782 Mixed hyperlipidemia: Secondary | ICD-10-CM

## 2021-02-15 DIAGNOSIS — R6889 Other general symptoms and signs: Secondary | ICD-10-CM

## 2021-02-15 DIAGNOSIS — J439 Emphysema, unspecified: Secondary | ICD-10-CM

## 2021-02-15 DIAGNOSIS — F428 Other obsessive-compulsive disorder: Secondary | ICD-10-CM

## 2021-02-15 DIAGNOSIS — R7309 Other abnormal glucose: Secondary | ICD-10-CM

## 2021-02-15 DIAGNOSIS — E559 Vitamin D deficiency, unspecified: Secondary | ICD-10-CM

## 2021-02-15 DIAGNOSIS — Z1159 Encounter for screening for other viral diseases: Secondary | ICD-10-CM

## 2021-02-15 DIAGNOSIS — I10 Essential (primary) hypertension: Secondary | ICD-10-CM | POA: Diagnosis not present

## 2021-02-15 DIAGNOSIS — Z0000 Encounter for general adult medical examination without abnormal findings: Secondary | ICD-10-CM

## 2021-02-15 DIAGNOSIS — Z0001 Encounter for general adult medical examination with abnormal findings: Secondary | ICD-10-CM | POA: Diagnosis not present

## 2021-02-15 DIAGNOSIS — Z86010 Personal history of colonic polyps: Secondary | ICD-10-CM

## 2021-02-15 DIAGNOSIS — F40243 Fear of flying: Secondary | ICD-10-CM

## 2021-02-15 DIAGNOSIS — M8580 Other specified disorders of bone density and structure, unspecified site: Secondary | ICD-10-CM | POA: Diagnosis not present

## 2021-02-15 DIAGNOSIS — K219 Gastro-esophageal reflux disease without esophagitis: Secondary | ICD-10-CM | POA: Diagnosis not present

## 2021-02-15 DIAGNOSIS — K222 Esophageal obstruction: Secondary | ICD-10-CM

## 2021-02-15 DIAGNOSIS — Z6823 Body mass index (BMI) 23.0-23.9, adult: Secondary | ICD-10-CM | POA: Diagnosis not present

## 2021-02-15 DIAGNOSIS — Z1231 Encounter for screening mammogram for malignant neoplasm of breast: Secondary | ICD-10-CM

## 2021-02-15 NOTE — Patient Instructions (Addendum)
  Donna Dillon , Thank you for taking time to come for your Medicare Wellness Visit. I appreciate your ongoing commitment to your health goals. Please review the following plan we discussed and let me know if I can assist you in the future.   These are the goals we discussed: Goals    . Exercise 150 min/wk Moderate Activity       This is a list of the screening recommended for you and due dates:  Health Maintenance  Topic Date Due  .  Hepatitis C: One time screening is recommended by Center for Disease Control  (CDC) for  adults born from [birth year] through 1964.   Never done  . COVID-19 Vaccine (1) Never done  . Mammogram  07/08/2020  . Flu Shot  03/08/2021*  . Colon Cancer Screening  03/05/2021  . Tetanus Vaccine  12/04/2027  . DEXA scan (bone density measurement)  Completed  . Pneumonia vaccines  Completed  . HPV Vaccine  Aged Out  *Topic was postponed. The date shown is not the original due date.     HOW TO SCHEDULE A MAMMOGRAM (and bone density)  [facility] Schedule an appointment by calling [phone].

## 2021-02-16 ENCOUNTER — Other Ambulatory Visit: Payer: Self-pay | Admitting: Adult Health

## 2021-02-16 LAB — COMPLETE METABOLIC PANEL WITH GFR
AG Ratio: 2 (calc) (ref 1.0–2.5)
ALT: 12 U/L (ref 6–29)
AST: 8 U/L — ABNORMAL LOW (ref 10–35)
Albumin: 4.4 g/dL (ref 3.6–5.1)
Alkaline phosphatase (APISO): 65 U/L (ref 37–153)
BUN: 14 mg/dL (ref 7–25)
CO2: 27 mmol/L (ref 20–32)
Calcium: 9.8 mg/dL (ref 8.6–10.4)
Chloride: 102 mmol/L (ref 98–110)
Creat: 0.77 mg/dL (ref 0.60–0.93)
GFR, Est African American: 89 mL/min/{1.73_m2} (ref >=60)
GFR, Est Non African American: 77 mL/min/{1.73_m2} (ref >=60)
Globulin: 2.2 g/dL (calc) (ref 1.9–3.7)
Glucose, Bld: 77 mg/dL (ref 65–99)
Potassium: 4.3 mmol/L (ref 3.5–5.3)
Sodium: 139 mmol/L (ref 135–146)
Total Bilirubin: 0.5 mg/dL (ref 0.2–1.2)
Total Protein: 6.6 g/dL (ref 6.1–8.1)

## 2021-02-16 LAB — HEPATITIS C ANTIBODY
Hepatitis C Ab: NONREACTIVE
SIGNAL TO CUT-OFF: 0 (ref <1.00)

## 2021-02-16 LAB — HEMOGLOBIN A1C
Hgb A1c MFr Bld: 5.3 % of total Hgb (ref <5.7)
Mean Plasma Glucose: 105 mg/dL
eAG (mmol/L): 5.8 mmol/L

## 2021-02-16 LAB — CBC WITH DIFFERENTIAL/PLATELET
Absolute Monocytes: 519 cells/uL (ref 200–950)
Basophils Absolute: 31 cells/uL (ref 0–200)
Basophils Relative: 0.5 %
Eosinophils Absolute: 207 cells/uL (ref 15–500)
Eosinophils Relative: 3.4 %
HCT: 42.3 % (ref 35.0–45.0)
Hemoglobin: 13.9 g/dL (ref 11.7–15.5)
Lymphs Abs: 2306 cells/uL (ref 850–3900)
MCH: 29.4 pg (ref 27.0–33.0)
MCHC: 32.9 g/dL (ref 32.0–36.0)
MCV: 89.6 fL (ref 80.0–100.0)
MPV: 10.1 fL (ref 7.5–12.5)
Monocytes Relative: 8.5 %
Neutro Abs: 3038 cells/uL (ref 1500–7800)
Neutrophils Relative %: 49.8 %
Platelets: 217 10*3/uL (ref 140–400)
RBC: 4.72 10*6/uL (ref 3.80–5.10)
RDW: 12.3 % (ref 11.0–15.0)
Total Lymphocyte: 37.8 %
WBC: 6.1 10*3/uL (ref 3.8–10.8)

## 2021-02-16 LAB — MAGNESIUM: Magnesium: 2.2 mg/dL (ref 1.5–2.5)

## 2021-02-16 LAB — LIPID PANEL
Cholesterol: 218 mg/dL — ABNORMAL HIGH (ref <200)
HDL: 66 mg/dL (ref >=50)
LDL Cholesterol (Calc): 123 mg/dL (calc) — ABNORMAL HIGH
Non-HDL Cholesterol (Calc): 152 mg/dL (calc) — ABNORMAL HIGH (ref <130)
Total CHOL/HDL Ratio: 3.3 (calc) (ref <5.0)
Triglycerides: 177 mg/dL — ABNORMAL HIGH (ref <150)

## 2021-02-16 LAB — TSH: TSH: 1.67 mIU/L (ref 0.40–4.50)

## 2021-02-16 MED ORDER — EZETIMIBE 10 MG PO TABS: ORAL_TABLET | ORAL | 3 refills | Status: DC

## 2021-02-23 ENCOUNTER — Encounter: Payer: Self-pay | Admitting: Internal Medicine

## 2021-03-15 ENCOUNTER — Encounter: Payer: Self-pay | Admitting: Psychiatry

## 2021-03-15 ENCOUNTER — Other Ambulatory Visit: Payer: Self-pay | Admitting: Internal Medicine

## 2021-03-15 ENCOUNTER — Ambulatory Visit (INDEPENDENT_AMBULATORY_CARE_PROVIDER_SITE_OTHER): Payer: Medicare PPO | Admitting: Psychiatry

## 2021-03-15 ENCOUNTER — Other Ambulatory Visit: Payer: Self-pay

## 2021-03-15 DIAGNOSIS — F411 Generalized anxiety disorder: Secondary | ICD-10-CM | POA: Diagnosis not present

## 2021-03-15 DIAGNOSIS — F3342 Major depressive disorder, recurrent, in full remission: Secondary | ICD-10-CM | POA: Diagnosis not present

## 2021-03-15 DIAGNOSIS — F5105 Insomnia due to other mental disorder: Secondary | ICD-10-CM

## 2021-03-15 DIAGNOSIS — I10 Essential (primary) hypertension: Secondary | ICD-10-CM

## 2021-03-15 DIAGNOSIS — R000 Tachycardia, unspecified: Secondary | ICD-10-CM

## 2021-03-15 DIAGNOSIS — F422 Mixed obsessional thoughts and acts: Secondary | ICD-10-CM

## 2021-03-15 MED ORDER — CLONAZEPAM 2 MG PO TABS: 2.0000 mg | ORAL_TABLET | Freq: Every day | ORAL | 1 refills | Status: DC

## 2021-03-15 MED ORDER — CLOMIPRAMINE HCL 50 MG PO CAPS
50.0000 mg | ORAL_CAPSULE | Freq: Every day | ORAL | 1 refills | Status: DC
Start: 2021-03-15 — End: 2021-09-13

## 2021-03-15 MED ORDER — CLOMIPRAMINE HCL 25 MG PO CAPS: 50.0000 mg | ORAL_CAPSULE | Freq: Every day | ORAL | 0 refills | Status: DC

## 2021-03-15 NOTE — Progress Notes (Signed)
Donna Dillon [id number] 07/07/1946 75 y.o.  Subjective:   Patient ID:  Donna Dillon is a 75 y.o. (DOB Jun 01, 1946) female.  Chief Complaint:  Chief Complaint  Patient presents with  . Follow-up  . Major depression, recurrent, full remission ([place])  . Anxiety    Depression        Associated symptoms include fatigue.  Associated symptoms include no decreased concentration, no appetite change, no headaches and no suicidal ideas.  Donna Dillon presents to the office today for follow-up of OCD.    seen December 2020.  No meds were changed.  Last seen Apr 10, 2020.  The following is noted: She is markedly worse. "falling apart".  More fatigued since dilation of esophagus.  January more dry eyes.  Hair falling out.  TSH is normal.   More sleep problems since Feb. Called and increased clonazepam to 2 mg HS bc also the insomnia was causing more anxiety.  Now also scared of taking the clonazepam.   Added trazodone 100 mg and it didn't help sleep. More obsessed with heart rate being elevated to 100 bpm.   Can't work DT anxiety and insomnia and fear over her heart. Scared.  She was ruminating. Plan:Increase atenolol for anxiety and tachycardia to 25 mg daily. DC trazodone Seroquel 25-50 mg for sleep Needs increased BZ .  She is afraid increasing the clonazepam but may be willing to take Xanax during the day.  Stay out of work this week. Disc option of Day treatment bc severity of sx and she's afraid of being alone. Option call Behavioral Health Assessment at the main [facility] phone.  Tell them I referred you to the Day treatment program. Return to office in 4 days.  04/14/2020 appointment the following is noted: Took 37.5 mg Seroquel Monday with 2 mg Klonopin and awoke in 3 hour. Afterwards usually took Seroquel 50 mg since then with Klonopin.  Lost another 1# and total of 11#/3 weeks.  Eating after taking alprazolam.  Can't quilt or read or focus on TV.  Heart rate is better after  increase atenolol to 25 mg daily. Now 5-6 hours of sleep. [name]. Plan: Start clomipramine 25 mg 1 capsule for 10 days, then 2 each night or 50 mg nightly. Continue Klonopin 2 mg HS Increase Seroquel 75 mg for sleep and anxiety and rumination Needs increased BZ .  She is afraid increasing the clonazepam but may be willing to take Xanax during the day.  Ok continue Xanax daytime for anxiety and allow her to eat..  Stay out of work right now.  Is on medical leave. Disc option of Day treatment bc severity of sx and she's afraid of being alone. Option call Behavioral Health Assessment at the main [facility] phone.  Tell them I referred you to the Day treatment program.  04/21/2020 urgent appointment, the following is noted: Generally sleeping better with quetiapine 75 mg HS.  Getting hangover.  Has to take Xanax to eat.  Losing weight 10#.  Just wants to lay down.  Feels shakey and weak. Did get restarted on clomipramine 25 mg HS.  Fatigue and hard to concentrate.   Plan: Continue medical leave until May 29, 2020.   The following med decisions were made: Continue clomipramine 25 mg 1 capsule for 10 days, then 2 each night or 50 mg nightly. Continue Klonopin 2 mg HS Increase Seroquel 75 mg for sleep and anxiety and rumination   05/03/2020  appointment urgently with the following noted:  Doesn't feel better yet.  Seroquel 75 gave her hangover.  With 50 mg usually 8 hours but some EMA esp if anxiety is bad.  Lays in bed until noon bc doesn't want to do anything  In bed to escape. When first started clomipramine 25 had sig insomnia.  At 50 mg now Appetite still poor and lost 10#.  Forcing food for 6 weeks. Idleness is not good for worry but can't focus well enough to read as much as she would usually otherwise bc anxiety interferes. Taking alprazolam 0.5 mg in pm to help eat dinner.  Getting from [place]. No med changes  05/18/20 appt with the  following noted:  Another urgent appt DT severity of anxiety. On clomipramine 50 mg since about 5/14. Appetite not better yet.  Lost total 12-13#.   Not depressed. Feels sleepy.  Always tired.  Wonders if there's medical cause. Sleeping a lot with Seroquel 50 mg HS. Notices less anxiety by "a little".   A little more obsessions since being at home but not severe. Not currently physically able to do the job and embarrassed about being out of work.  Disc concerns about her age and work and wondering about retirement from it fully.  No med changes.  06/07/2020 appointment with the following noted: Sleeping well with clonazepam 2 mg HS and quetiapine 50 mg HS. Still on clomipramine 50 since 04/21/20. Still exhausted from going to grocery store.  Not mentally exhausted but is physically.   Has remained on medical leave but not covered by FMLA bc not working FT. Decided to resign.  Doesn't feel able to do it yet.  Hopes to get to volunteer in the fall. Naps a good deal. Plan: Continue clomipramine 50 mg nightly (started about 5/14) Continue Klonopin 2 mg HS Continue Seroquel 25-50 mg for sleep and anxiety and rumination .  Try reducing as soon as she can to help energy. Ok continue Xanax daytime for anxiety and allow her to eat..  Meds won't work if you don't get enough protein.  08/10/20 appt with the following noted:  Nothing to do but gave job up bc didn't think she could do it physically. Did clean her house well.   Clomipramine has kicked in but hard time bc nothing to divert her attention.   No anxiety until married. After 5-6 years later got osbessions.  Never compulsions. Not depressed or despairing anymore. Thinking of 12 hour a week job and needs it to get out of the house.  Needs stimulation and activity. Plan: no med changes.  11/13/2020 appointment with the following noted: Stopped quetiapine a week ago.  Sleep is OK with a little more awakening but not severe.   Some chronic OCD  but minimal some chronic anxiety but manageable.  Not depressed.  Sleep is adequate Son [name] Cancer doing well with promotion at [employer] people. Surprisingly well. Still has compulsions including night eating.  Married since 2016.  Hasn't travelled lately but wants to.  [place]ys [place]. [place].  Has a dog. Plan no med changes:   03/15/2021 appointment with the following noted: A lot of changes in the last year.  Health decline in physical ability to walk.  No particular reason. Lost job and friends at the job.  Lost dog of 12 years.  B died in 12-19-22, lived in [place].   Was going to volunteer but didn't DT covid. Dx advancing COPD despite not smoking.  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 Clonazepam helps her and needs the 2 mg dose.  Afraid to reduce it.  Insurance won't cover. Needed to increase clonazepam to 2 mg HS 1 year ago and sleeping fine. No seroquel needed. Not depressed. Son eccentric but genius.  Past Psychiatric Medication Trials:  Clomipramine,   Poor response SSRIs  Trazodone 100 NR Seroquel 75 hangover Clonazepam 1-2 mg HS  Review of Systems:  Review of Systems  Constitutional: Positive for fatigue. Negative for appetite change and unexpected weight change.       Lost 10-12 # in 3 weeks  Respiratory: Positive for shortness of breath.   Gastrointestinal: Negative for abdominal pain.  Neurological: Negative for dizziness, tremors, weakness and headaches.  Psychiatric/Behavioral: Positive for depression. Negative for agitation, behavioral problems, confusion, decreased concentration, dysphoric mood, hallucinations, self-injury, sleep disturbance and suicidal ideas. The patient is nervous/anxious. The patient is not hyperactive.     Medications: I have reviewed the patient's current medications.  Current Outpatient Medications  Medication Sig Dispense Refill  . atenolol (TENORMIN) 25 MG tablet TAKE 1 TABLET DAILY FOR BLOOD PRESSURE. 90 tablet  3  . Calcium 1500 MG tablet Take 1,500 mg by mouth daily.     . clomiPRAMINE (ANAFRANIL) 25 MG capsule Take 2 capsules (50 mg total) by mouth at bedtime. 60 capsule 0  . ezetimibe (ZETIA) 10 MG tablet Take 1 tablet Daily for Cholesterol 90 tablet 3  . fish oil-omega-3 fatty acids 1000 MG capsule Take 1 g by mouth daily.    . magnesium 30 MG tablet Take 30 mg by mouth daily.     . Multiple Vitamin (MULTIVITAMIN) tablet Take 1 tablet by mouth daily.    . Multiple Vitamins-Minerals (EYE VITAMINS PO) Take 1 capsule by mouth daily. Ocusight Vitamins    . omeprazole (PRILOSEC) 40 MG capsule Take 1 capsule (40 mg total) by mouth daily. 30 capsule 11  . vitamin C (ASCORBIC ACID) 500 MG tablet Take 500 mg by mouth daily.    Marland Kitchen VITAMIN D PO Take 5,000 Units by mouth. Takes 1 to 2 capsules daily.    . clomiPRAMINE (ANAFRANIL) 50 MG capsule Take 1 capsule (50 mg total) by mouth at bedtime. 90 capsule 1  . clonazePAM (KLONOPIN) 2 MG tablet Take 1 tablet (2 mg total) by mouth at bedtime. 90 tablet 1   No current facility-administered medications for this visit.    Medication Side Effects: None, dry  Allergies:  Allergies  Allergen Reactions  . Prednisone     Altered mental state  . Penicillins   . Asa [Aspirin] Diarrhea  . Levaquin [Levofloxacin] Other (See Comments)    Joint Pain  . Macrolides And Ketolides Nausea And Vomiting  . Paxil [Paroxetine Hcl] Nausea And Vomiting  . Tetracyclines & Related Nausea And Vomiting  . Zoloft [Sertraline Hcl] Nausea And Vomiting  . Sulfa Antibiotics Rash  . Trimethoprim Rash    Past Medical History:  Diagnosis Date  . Anemia    mild  . Anxiety   . Colon polyp    hyperplastic  . Diverticulosis   . Esophageal stricture   . GERD (gastroesophageal reflux disease)    no s/s now- off medicines  . Hyperlipidemia   . Palpitations   . Prediabetes   . Tachycardia     Family History  Problem Relation Age of Onset  . Kidney cancer Father   .  Hypertension Mother   . Heart disease Mother   .  Colon cancer Paternal Aunt   . Colon polyps Neg Hx   . Esophageal cancer Neg Hx   . Rectal cancer Neg Hx   . Stomach cancer Neg Hx     Social History   Socioeconomic History  . Marital status: Married    Spouse name: Not on file  . Number of children: 1  . Years of education: Not on file  . Highest education level: Not on file  Occupational History  . Occupation: Librarian  Tobacco Use  . Smoking status: Never Smoker  . Smokeless tobacco: Never Used  Vaping Use  . Vaping Use: Never used  Substance and Sexual Activity  . Alcohol use: No    Alcohol/week: 0.0 standard drinks  . Drug use: No  . Sexual activity: Not on file  Other Topics Concern  . Not on file  Social History Narrative  . Not on file   Social Determinants of Health   Financial Resource Strain: Not on file  Food Insecurity: Not on file  Transportation Needs: Not on file  Physical Activity: Not on file  Stress: Not on file  Social Connections: Not on file  Intimate Partner Violence: Not on file    Past Medical History, Surgical history, Social history, and Family history were reviewed and updated as appropriate.   Son has severe anxiety and is treated also.  Please see review of systems for further details on the patient's review from today.   Objective:   Physical Exam:  LMP 12/09/1994   Physical Exam Constitutional:      General: She is not in acute distress.    Appearance: She is well-developed.  Musculoskeletal:        General: No deformity.  Neurological:     Mental Status: She is alert and oriented to person, place, and time.     Motor: No tremor.     Coordination: Coordination normal.     Gait: Gait normal.  Psychiatric:        Attention and Perception: She is attentive. She does not perceive auditory hallucinations.        Mood and Affect: Mood is anxious. Mood is not depressed. Affect is not labile, blunt, angry, tearful or  inappropriate.        Speech: Speech normal. Speech is not slurred.        Behavior: Behavior normal.        Thought Content: Thought content normal. Thought content does not include homicidal or suicidal ideation. Thought content does not include homicidal or suicidal plan.        Cognition and Memory: Cognition normal.        Judgment: Judgment normal.     Comments: Insight intact. Residual obsessions if not busy.. No auditory or visual hallucinations. No delusions.  No rumination anymore.   Neat  Pleasant and talkative      Lab Review:     Component Value Date/Time   NA 139 02/15/2021 1546   K 4.3 02/15/2021 1546   CL 102 02/15/2021 1546   CO2 27 02/15/2021 1546   GLUCOSE 77 02/15/2021 1546   BUN 14 02/15/2021 1546   CREATININE 0.77 02/15/2021 1546   CALCIUM 9.8 02/15/2021 1546   PROT 6.6 02/15/2021 1546   ALBUMIN 4.0 05/13/2017 1510   AST 8 (L) 02/15/2021 1546   ALT 12 02/15/2021 1546   ALKPHOS 61 05/13/2017 1510   BILITOT 0.5 02/15/2021 1546   GFRNONAA 77 02/15/2021 1546   GFRAA 89 02/15/2021 1546  Component Value Date/Time   WBC 6.1 02/15/2021 1546   RBC 4.72 02/15/2021 1546   HGB 13.9 02/15/2021 1546   HCT 42.3 02/15/2021 1546   PLT 217 02/15/2021 1546   MCV 89.6 02/15/2021 1546   MCH 29.4 02/15/2021 1546   MCHC 32.9 02/15/2021 1546   RDW 12.3 02/15/2021 1546   LYMPHSABS 2,306 02/15/2021 1546   MONOABS 0.8 12/18/2018 1628   EOSABS 207 02/15/2021 1546   BASOSABS 31 02/15/2021 1546    No results found for: POCLITH, LITHIUM   No results found for: PHENYTOIN, PHENOBARB, VALPROATE, CBMZ   .res Assessment: Plan:    Major depression, recurrent, full remission ([place]) - Plan: clomiPRAMINE (ANAFRANIL) 25 MG capsule  Mixed obsessional thoughts and acts - Plan: clomiPRAMINE (ANAFRANIL) 25 MG capsule, clomiPRAMINE (ANAFRANIL) 50 MG capsule  Generalized anxiety disorder - Plan: clomiPRAMINE (ANAFRANIL) 25 MG capsule, clomiPRAMINE (ANAFRANIL) 50 MG  capsule  Insomnia due to mental condition - Plan: clonazePAM (KLONOPIN) 2 MG tablet  Major depressive disorder, recurrent episode, moderate ([place]) - Plan: clomiPRAMINE (ANAFRANIL) 50 MG capsule    Marked  worsening of anxiety and depresssion without obvious worsening of OCD after STOPPING CLOMIPRAMINE ACCIDENTALLY A FEW MONTHS AGO in 2020. Is improving some now. Needs more time to help fully.    Sleeping better with meds and is able to eat.  OK off quetiapine so far.  But cannot sleep without clonazepam 2 mg HS.  Disc pros and cons of reducing the dose  Discussed potential metabolic side effects associated with atypical antipsychotics, as well as potential risk for movement side effects. Advised pt to contact office if movement side effects occur.   We discussed the short-term risks associated with benzodiazepines including sedation and increased fall risk among others.  Discussed long-term side effect risk including dependence, potential withdrawal symptoms, and the potential eventual dose-related risk of dementia.  But recent studies from 2020 dispute this association between benzodiazepines and dementia risk. Newer studies in 2020 do not support an association with dementia. Disc her fears of addiction.  Wants to eventually consider tapering off of it using the quetiapine to protect her sleep.   Supportive therapy on relapse prevention and what to do to deal with her job and anxiety. Keep herself occupied in retirement.  Disc this again and she's looking for volunteer position.       Enc exercise.  Start back.  She doesn't feel capable yet but did before.  Continue clomipramine 50 mg nightly (started about 04/2013) Continue Klonopin 2 mg HS for now but hope to gradually reduce it with goal of about 1 mg eventually.  Unlikely to be able to stop it. Disc way to reduce it in detail.  No  med changes today.   This appt was 30 mins.  FU 3-4 mos   Lynder Parents, MD, DFAPA   Please see  After Visit Summary for patient specific instructions.  Future Appointments  Date Time Provider [place]  08/20/2021  3:00 PM Unk Pinto, MD [facility] None  02/18/2022  2:30 PM Liane Comber, NP [facility] None    No orders of the defined types were placed in this encounter.     -------------------------------

## 2021-08-20 ENCOUNTER — Other Ambulatory Visit: Payer: Self-pay

## 2021-08-20 ENCOUNTER — Encounter: Payer: Self-pay | Admitting: Internal Medicine

## 2021-08-20 ENCOUNTER — Ambulatory Visit (INDEPENDENT_AMBULATORY_CARE_PROVIDER_SITE_OTHER): Payer: Medicare PPO | Admitting: Internal Medicine

## 2021-08-20 VITALS — BP 112/68 | HR 66 | Temp 97.4°F | Resp 16 | Ht 66.0 in | Wt 152.0 lb

## 2021-08-20 DIAGNOSIS — R0989 Other specified symptoms and signs involving the circulatory and respiratory systems: Secondary | ICD-10-CM | POA: Diagnosis not present

## 2021-08-20 DIAGNOSIS — Z1211 Encounter for screening for malignant neoplasm of colon: Secondary | ICD-10-CM

## 2021-08-20 DIAGNOSIS — Z136 Encounter for screening for cardiovascular disorders: Secondary | ICD-10-CM

## 2021-08-20 DIAGNOSIS — Z0000 Encounter for general adult medical examination without abnormal findings: Secondary | ICD-10-CM | POA: Diagnosis not present

## 2021-08-20 DIAGNOSIS — E782 Mixed hyperlipidemia: Secondary | ICD-10-CM | POA: Diagnosis not present

## 2021-08-20 DIAGNOSIS — Z8249 Family history of ischemic heart disease and other diseases of the circulatory system: Secondary | ICD-10-CM

## 2021-08-20 DIAGNOSIS — Z79899 Other long term (current) drug therapy: Secondary | ICD-10-CM | POA: Diagnosis not present

## 2021-08-20 DIAGNOSIS — R7309 Other abnormal glucose: Secondary | ICD-10-CM

## 2021-08-20 DIAGNOSIS — Z0001 Encounter for general adult medical examination with abnormal findings: Secondary | ICD-10-CM

## 2021-08-20 DIAGNOSIS — F325 Major depressive disorder, single episode, in full remission: Secondary | ICD-10-CM

## 2021-08-20 DIAGNOSIS — E559 Vitamin D deficiency, unspecified: Secondary | ICD-10-CM

## 2021-08-20 NOTE — Progress Notes (Signed)
Annual Screening/Preventative Visit & Comprehensive Evaluation &  Examination  Future Appointments  Date Time Provider [place]  08/20/2021   - CPE   3:00 PM Unk Pinto, MD [facility] None  09/13/2021  4:00 PM Cottle, Billey Co., MD [facility] None  02/18/2022   - 6 mo OV & Wellness  2:30 PM Liane Comber, NP [facility] None  08/20/2022   - CPE  3:00 PM Unk Pinto, MD [facility] None        This very nice 75 y.o. MWF presents for a Screening /Preventative Visit & comprehensive evaluation and management of multiple medical co-morbidities.  Patient has been followed for HTN, HLD, Prediabetes  and Vitamin D Deficiency. Patient has long standing major Depression,  OCD with mixed obsessional thoughts & acts, Generalized Anxiety Disorder and Insomnias & is on meds & followed by Psychiatrist Dr Clovis Pu ~ 15 years. Patient has GERD controlled on her meds.         HTN predates since  1990's. Patient's BP has been controlled at home and patient denies any cardiac symptoms as chest pain, palpitations, shortness of breath, dizziness or ankle swelling. Today's BP is at goal - 112/68.        Patient's hyperlipidemia is not controlled with diet and Ezetimibe. Patient denies myalgias or other medication SE's. Last lipids were   Lab Results  Component Value Date   CHOL 218 (H) 02/15/2021   HDL 66 02/15/2021   LDLCALC 123 (H) 02/15/2021   TRIG 177 (H) 02/15/2021   CHOLHDL 3.3 02/15/2021         Patient has hx/o prediabetes (A1c 5.8% /2011) and patient denies reactive hypoglycemic symptoms, visual blurring, diabetic polys or paresthesias. Last A1c was   Lab Results  Component Value Date   HGBA1C 5.3 02/15/2021         Finally, patient has history of Vitamin D Deficiency ("36" /2008) and last Vitamin D was   Lab Results  Component Value Date   VD25OH 58 08/15/2020     Current Outpatient Medications on File Prior to Visit  Medication Sig   atenolol 25 MG tablet TAKE 1  TABLET DAILY    Calcium 1500 MG tablet Take daily.    clomiPRAMINE (ANAFRANIL) 25 MG cap Take 2 capsules  at bedtime.   clonazePAM (KLONOPIN) 2 MG tablet Take 1 tablet  at bedtime.   ezetimibe (ZETIA) 10 MG tablet Take 1 tablet Daily for Cholesterol   fish oil-omega-3 000 MG cap Take daily.   magnesium 30 MG tablet Take 30 mg daily.    Multiple Vitamin (tablet Take 1 tablet daily.   Ocusight Vitamins Take 1 capsule daily.    omeprazole 40 MG capsule Take 1 capsule  daily.   vitamin C 500 MG tablet Take daily.   VITAMIN D 5,000 Units Takes 1 to 2 capsules daily.    Allergies  Allergen Reactions   Prednisone     Altered mental state   Penicillins    Asa [Aspirin] Diarrhea   Levaquin [Levofloxacin] Other (See Comments)    Joint Pain   Macrolides And Ketolides Nausea And Vomiting   Paxil [Paroxetine Hcl] Nausea And Vomiting   Tetracyclines & Related Nausea And Vomiting   Zoloft [Sertraline Hcl] Nausea And Vomiting   Sulfa Antibiotics Rash   Trimethoprim Rash     Past Medical History:  Diagnosis Date   Anemia    mild   Anxiety    Colon polyp    hyperplastic   Diverticulosis  Esophageal stricture    GERD (gastroesophageal reflux disease)    no s/s now- off medicines   Hyperlipidemia    Palpitations    Prediabetes    Tachycardia      Health Maintenance  Topic Date Due   Zoster Vaccines- Shingrix (1 of 2) Never done   COVID-19 Vaccine (3 - Pfizer risk series) 04/17/2020   MAMMOGRAM  07/08/2020   COLONOSCOPY  03/05/2021   INFLUENZA VACCINE  07/09/2021   TETANUS/TDAP  12/04/2027   DEXA SCAN  Completed   Hepatitis C Screening  Completed   PNA vac Low Risk Adult  Completed   HPV VACCINES  Aged Out     Immunization History  Administered Date(s) Administered   Influenza, High Dose Seasonal PF 11/14/2014, 10/10/2016, 12/03/2017, 11/03/2018   PFIZER(Purple Top)SARS-COV-2 Vaccination 02/24/2020, 03/20/2020   Pneumococcal Conjugate-13 09/21/2015   Pneumococcal  Polysaccharide-23 02/14/2009, 05/13/2017   Td 12/11/2006, 12/03/2017   Tdap 12/09/2005   Zoster, Live 02/27/2011     Last Colon - 03/05/2016 - Dr Henrene Pastor - Recc 5 yr f/u - due Apr 2022   Last MGM - 07/09/2019 - Patient aware overdue & plans to schedule   Past Surgical History:  Procedure Laterality Date   BREAST BIOPSY Left    CESAREAN SECTION     x1   COLONOSCOPY  2017   eye lift     POLYPECTOMY     TONSILLECTOMY     UPPER GASTROINTESTINAL ENDOSCOPY       Family History  Problem Relation Age of Onset   Kidney cancer Father    Hypertension Mother    Heart disease Mother    Colon cancer Paternal Aunt    Colon polyps Neg Hx    Esophageal cancer Neg Hx    Rectal cancer Neg Hx    Stomach cancer Neg Hx      Social History   Tobacco Use   Smoking status: Never   Smokeless tobacco: Never  Vaping Use   Vaping Use: Never used  Substance Use Topics   Alcohol use: No    Alcohol/week: 0.0 standard drinks   Drug use: No      ROS Constitutional: Denies fever, chills, weight loss/gain, headaches, insomnia,  night sweats, and change in appetite. Does c/o fatigue. Eyes: Denies redness, blurred vision, diplopia, discharge, itchy, watery eyes.  ENT: Denies discharge, congestion, post nasal drip, epistaxis, sore throat, earache, hearing loss, dental pain, Tinnitus, Vertigo, Sinus pain, snoring.  Cardio: Denies chest pain, palpitations, irregular heartbeat, syncope, dyspnea, diaphoresis, orthopnea, PND, claudication, edema Respiratory: denies cough, dyspnea, DOE, pleurisy, hoarseness, laryngitis, wheezing.  Gastrointestinal: Denies dysphagia, heartburn, reflux, water brash, pain, cramps, nausea, vomiting, bloating, diarrhea, constipation, hematemesis, melena, hematochezia, jaundice, hemorrhoids Genitourinary: Denies dysuria, frequency, urgency, nocturia, hesitancy, discharge, hematuria, flank pain Breast: Breast lumps, nipple discharge, bleeding.  Musculoskeletal: Denies  arthralgia, myalgia, stiffness, Jt. Swelling, pain, limp, and strain/sprain. Denies falls. Skin: Denies puritis, rash, hives, warts, acne, eczema, changing in skin lesion Neuro: No weakness, tremor, incoordination, spasms, paresthesia, pain Psychiatric: Denies confusion, memory loss, sensory loss. Denies Depression. Endocrine: Denies change in weight, skin, hair change, nocturia, and paresthesia, diabetic polys, visual blurring, hyper / hypo glycemic episodes.  Heme/Lymph: No excessive bleeding, bruising, enlarged lymph nodes.  Physical Exam  BP 112/68   Pulse 66   Temp (!) 97.4 F (36.3 C)   Resp 16   Ht 5\' 6"  (1.676 m)   Wt 152 lb (68.9 kg)   LMP 12/09/1994   SpO2 99%  BMI 24.53 kg/m   General Appearance: Well nourished, well groomed and in no apparent distress.  Eyes: PERRLA, EOMs, conjunctiva no swelling or erythema, normal fundi and vessels. Sinuses: No frontal/maxillary tenderness ENT/Mouth: EACs patent / TMs  nl. Nares clear without erythema, swelling, mucoid exudates. Oral hygiene is good. No erythema, swelling, or exudate. Tongue normal, non-obstructing. Tonsils not swollen or erythematous. Hearing normal.  Neck: Supple, thyroid not palpable. No bruits, nodes or JVD. Respiratory: Respiratory effort normal.  BS equal and clear bilateral without rales, rhonci, wheezing or stridor. Cardio: Heart sounds are normal with regular rate and rhythm and no murmurs, rubs or gallops. Peripheral pulses are normal and equal bilaterally without edema. No aortic or femoral bruits. Chest: symmetric with normal excursions and percussion. Breasts: Symmetric, without lumps, nipple discharge, retractions, or fibrocystic changes.  Abdomen: Flat, soft with bowel sounds active. Nontender, no guarding, rebound, hernias, masses, or organomegaly.  Lymphatics: Non tender without lymphadenopathy.  Musculoskeletal: Full ROM all peripheral extremities, joint stability, 5/5 strength, and normal  gait. Skin: Warm and dry without rashes, lesions, cyanosis, clubbing or  ecchymosis.  Neuro: Cranial nerves intact, reflexes equal bilaterally. Normal muscle tone, no cerebellar symptoms. Sensation intact.  Pysch: Alert and oriented X 3, normal affect, Insight and Judgment appropriate.    Assessment and Plan  1. Annual Preventative Screening Examination   2. Labile hypertension  - EKG 12-Lead - Urinalysis, Routine w reflex microscopic - Microalbumin / creatinine urine ratio - CBC with Differential/Platelet - COMPLETE METABOLIC PANEL WITH GFR - Magnesium - TSH  3. Hyperlipidemia, mixed  - EKG 12-Lead - Lipid panel - TSH  4. Abnormal glucose  - EKG 12-Lead - Hemoglobin A1c - Insulin, random  5. Vitamin D deficiency  - VITAMIN D 25 Hydroxy   6. Major depressive disorder in remission,(HCC)  - TSH  7. Screening for colorectal cancer  - POC Hemoccult Bld/Stl   8. Screening for ischemic heart disease  - EKG 12-Lead  9. FHx: heart disease  - EKG 12-Lead  10. Medication management  - Urinalysis, Routine w reflex microscopic - Microalbumin / creatinine urine ratio - CBC with Differential/Platelet - COMPLETE METABOLIC PANEL WITH GFR - Magnesium - Lipid panel - TSH - Hemoglobin A1c - Insulin, random - VITAMIN D 25 Hydroxy            Patient was counseled in prudent diet to achieve/maintain BMI less than 25 for weight control, BP monitoring, regular exercise and medications. Discussed med's effects and SE's. Screening labs and tests as requested with regular follow-up as recommended. Over 40 minutes of exam, counseling, chart review and high complex critical decision making was performed.   Kirtland Bouchard, MD

## 2021-08-20 NOTE — Patient Instructions (Signed)

## 2021-08-21 ENCOUNTER — Ambulatory Visit (INDEPENDENT_AMBULATORY_CARE_PROVIDER_SITE_OTHER): Payer: Medicare PPO

## 2021-08-21 VITALS — BP 109/66 | HR 70 | Temp 97.4°F | Resp 16 | Ht 66.0 in | Wt 153.6 lb

## 2021-08-21 DIAGNOSIS — Z0000 Encounter for general adult medical examination without abnormal findings: Secondary | ICD-10-CM

## 2021-08-21 DIAGNOSIS — I10 Essential (primary) hypertension: Secondary | ICD-10-CM | POA: Diagnosis not present

## 2021-08-21 LAB — COMPLETE METABOLIC PANEL WITH GFR
AG Ratio: 1.8 (calc) (ref 1.0–2.5)
ALT: 16 U/L (ref 6–29)
AST: 10 U/L (ref 10–35)
Albumin: 4.4 g/dL (ref 3.6–5.1)
Alkaline phosphatase (APISO): 66 U/L (ref 37–153)
BUN: 8 mg/dL (ref 7–25)
CO2: 30 mmol/L (ref 20–32)
Calcium: 9.8 mg/dL (ref 8.6–10.4)
Chloride: 104 mmol/L (ref 98–110)
Creat: 0.87 mg/dL (ref 0.60–1.00)
Globulin: 2.5 g/dL (calc) (ref 1.9–3.7)
Glucose, Bld: 87 mg/dL (ref 65–99)
Potassium: 4.2 mmol/L (ref 3.5–5.3)
Sodium: 139 mmol/L (ref 135–146)
Total Bilirubin: 0.6 mg/dL (ref 0.2–1.2)
Total Protein: 6.9 g/dL (ref 6.1–8.1)
eGFR: 70 mL/min/{1.73_m2} (ref >=60)

## 2021-08-21 LAB — MICROALBUMIN / CREATININE URINE RATIO
Creatinine, Urine: 92 mg/dL (ref 20–275)
Microalb Creat Ratio: 2 mcg/mg creat (ref <30)
Microalb, Ur: 0.2 mg/dL

## 2021-08-21 LAB — CBC WITH DIFFERENTIAL/PLATELET
Absolute Monocytes: 456 cells/uL (ref 200–950)
Basophils Absolute: 40 cells/uL (ref 0–200)
Basophils Relative: 0.7 %
Eosinophils Absolute: 171 cells/uL (ref 15–500)
Eosinophils Relative: 3 %
HCT: 42.3 % (ref 35.0–45.0)
Hemoglobin: 14 g/dL (ref 11.7–15.5)
Lymphs Abs: 2434 cells/uL (ref 850–3900)
MCH: 29.4 pg (ref 27.0–33.0)
MCHC: 33.1 g/dL (ref 32.0–36.0)
MCV: 88.7 fL (ref 80.0–100.0)
MPV: 9.8 fL (ref 7.5–12.5)
Monocytes Relative: 8 %
Neutro Abs: 2599 cells/uL (ref 1500–7800)
Neutrophils Relative %: 45.6 %
Platelets: 230 10*3/uL (ref 140–400)
RBC: 4.77 10*6/uL (ref 3.80–5.10)
RDW: 12.7 % (ref 11.0–15.0)
Total Lymphocyte: 42.7 %
WBC: 5.7 10*3/uL (ref 3.8–10.8)

## 2021-08-21 LAB — URINALYSIS, ROUTINE W REFLEX MICROSCOPIC
Bilirubin Urine: NEGATIVE
Glucose, UA: NEGATIVE
Hgb urine dipstick: NEGATIVE
Ketones, ur: NEGATIVE
Leukocytes,Ua: NEGATIVE
Nitrite: NEGATIVE
Protein, ur: NEGATIVE
Specific Gravity, Urine: 1.014 (ref 1.001–1.035)
pH: 8 (ref 5.0–8.0)

## 2021-08-21 LAB — HEMOGLOBIN A1C
Hgb A1c MFr Bld: 5.3 % of total Hgb (ref <5.7)
Mean Plasma Glucose: 105 mg/dL
eAG (mmol/L): 5.8 mmol/L

## 2021-08-21 LAB — LIPID PANEL
Cholesterol: 195 mg/dL (ref <200)
HDL: 65 mg/dL (ref >=50)
LDL Cholesterol (Calc): 102 mg/dL (calc) — ABNORMAL HIGH
Non-HDL Cholesterol (Calc): 130 mg/dL (calc) — ABNORMAL HIGH (ref <130)
Total CHOL/HDL Ratio: 3 (calc) (ref <5.0)
Triglycerides: 161 mg/dL — ABNORMAL HIGH (ref <150)

## 2021-08-21 LAB — TSH: TSH: 1.78 mIU/L (ref 0.40–4.50)

## 2021-08-21 LAB — VITAMIN D 25 HYDROXY (VIT D DEFICIENCY, FRACTURES): Vit D, 25-Hydroxy: 73 ng/mL (ref 30–100)

## 2021-08-21 LAB — MAGNESIUM: Magnesium: 2.3 mg/dL (ref 1.5–2.5)

## 2021-08-21 LAB — INSULIN, RANDOM: Insulin: 3.6 u[IU]/mL

## 2021-08-21 NOTE — Progress Notes (Signed)
============================================================ -   Test results slightly outside the reference range are not unusual. If there is anything important, I will review this with you,  otherwise it is considered normal test values.  If you have further questions,  please do not hesitate to contact me at the office or via My Chart.  ============================================================ ============================================================  -  Total Chol = 195 - Sl Elevated                  (  Ideal or Goal is less than 180  !  )   - and   - Bad  LDL Chol = 102 - Also Elevated               (   Ideal or Goal is less than 70  !  )   - Cholesterol is too high - Recommend a stricter  low cholesterol diet                (  or will need to start meds for Cholesterol  )   - Cholesterol only comes from animal sources  - ie. meat, dairy, egg yolks  - Eat all the vegetables you want.  - Avoid meat, especially red meat - Beef AND Pork .  - Avoid cheese & dairy - milk & ice cream.     - Cheese is the most concentrated form of trans-fats which  is the worst thing to clog up our arteries.   - Veggie cheese is OK which can be found in the fresh  produce section at [employer] or [employer] or [employer] ============================================================ ============================================================  -  A1c - Normal - No Diabetes  - Great ! ============================================================ ============================================================  -  Vitamin D = 73 - Excellent  ============================================================ ============================================================  -  All Else - CBC - Kidneys - Electrolytes - Liver - Magnesium & Thyroid    - all  Normal /  OK ============================================================ ============================================================

## 2021-08-21 NOTE — Progress Notes (Signed)
The patient came in for repeat EKG per Dr. Melford Aase status post complete physical exam on 08/20/21. EKG was performed.

## 2021-09-13 ENCOUNTER — Encounter: Payer: Self-pay | Admitting: Psychiatry

## 2021-09-13 ENCOUNTER — Ambulatory Visit: Payer: Medicare PPO | Admitting: Psychiatry

## 2021-09-13 ENCOUNTER — Other Ambulatory Visit: Payer: Self-pay

## 2021-09-13 ENCOUNTER — Other Ambulatory Visit: Payer: Self-pay | Admitting: Psychiatry

## 2021-09-13 DIAGNOSIS — F422 Mixed obsessional thoughts and acts: Secondary | ICD-10-CM

## 2021-09-13 DIAGNOSIS — R000 Tachycardia, unspecified: Secondary | ICD-10-CM | POA: Diagnosis not present

## 2021-09-13 DIAGNOSIS — F5105 Insomnia due to other mental disorder: Secondary | ICD-10-CM

## 2021-09-13 DIAGNOSIS — F3342 Major depressive disorder, recurrent, in full remission: Secondary | ICD-10-CM

## 2021-09-13 DIAGNOSIS — F411 Generalized anxiety disorder: Secondary | ICD-10-CM | POA: Diagnosis not present

## 2021-09-13 MED ORDER — CLOMIPRAMINE HCL 50 MG PO CAPS: 50.0000 mg | ORAL_CAPSULE | Freq: Every day | ORAL | 2 refills | Status: DC

## 2021-09-13 MED ORDER — CLONAZEPAM 2 MG PO TABS: 2.0000 mg | ORAL_TABLET | Freq: Every day | ORAL | 1 refills | Status: DC

## 2021-09-13 NOTE — Progress Notes (Signed)
Donna Dillon [id number] 16-Mar-1946 75 y.o.  Subjective:   Patient ID:  Donna Dillon is a 75 y.o. (DOB 1946-03-14) female.  Chief Complaint:  Chief Complaint  Patient presents with   Follow-up   Depression   Anxiety    Depression        Associated symptoms include fatigue.  Associated symptoms include no decreased concentration, no appetite change, no headaches and no suicidal ideas. Donna Dillon presents to the office today for follow-up of OCD.    seen December 2020.  No meds were changed.  Last seen Apr 10, 2020.  The following is noted: She is markedly worse. "falling apart".  More fatigued since dilation of esophagus.  January more dry eyes.  Hair falling out.  TSH is normal.   More sleep problems since Feb. Called and increased clonazepam to 2 mg HS bc also the insomnia was causing more anxiety.  Now also scared of taking the clonazepam.   Added trazodone 100 mg and it didn't help sleep. More obsessed with heart rate being elevated to 100 bpm.   Can't work DT anxiety and insomnia and fear over her heart. Scared.  She was ruminating. Plan:Increase atenolol for anxiety and tachycardia to 25 mg daily. DC trazodone Seroquel 25-50 mg for sleep Needs increased BZ .  She is afraid increasing the clonazepam but may be willing to take Xanax during the day.  Stay out of work this week. Disc option of Day treatment bc severity of sx and she's afraid of being alone. Option call Behavioral Health Assessment at the main [facility] phone.  Tell them I referred you to the Day treatment program. Return to office in 4 days.  04/14/2020 appointment the following is noted: Took 37.5 mg Seroquel Monday with 2 mg Klonopin and awoke in 3 hour. Afterwards usually took Seroquel 50 mg since then with Klonopin.  Lost another 1# and total of 11#/3 weeks.  Eating after taking alprazolam.  Can't quilt or read or focus on TV.  Heart rate is better after increase atenolol to 25 mg daily. Now 5-6 hours  of sleep. [name]. Plan: Start clomipramine 25 mg 1 capsule for 10 days, then 2 each night or 50 mg nightly. Continue Klonopin 2 mg HS Increase Seroquel 75 mg for sleep and anxiety and rumination Needs increased BZ .  She is afraid increasing the clonazepam but may be willing to take Xanax during the day.  Ok continue Xanax daytime for anxiety and allow her to eat..  Stay out of work right now.  Is on medical leave. Disc option of Day treatment bc severity of sx and she's afraid of being alone. Option call Behavioral Health Assessment at the main [facility] phone.  Tell them I referred you to the Day treatment program.  04/21/2020 urgent appointment, the following is noted: Generally sleeping better with quetiapine 75 mg HS.  Getting hangover.  Has to take Xanax to eat.  Losing weight 10#.  Just wants to lay down.  Feels shakey and weak. Did get restarted on clomipramine 25 mg HS.  Fatigue and hard to concentrate.   Plan: Continue medical leave until May 29, 2020.   The following med decisions were made: Continue clomipramine 25 mg 1 capsule for 10 days, then 2 each night or 50 mg nightly. Continue Klonopin 2 mg HS Increase Seroquel 75 mg for sleep and anxiety and rumination   05/03/2020 appointment urgently with the following noted:  Doesn't feel better yet.  Seroquel 75 gave her hangover.  With 50 mg usually 8 hours but some EMA esp if anxiety is bad.  Lays in bed until noon bc doesn't want to do anything  In bed to escape. When first started clomipramine 25 had sig insomnia.  At 50 mg now Appetite still poor and lost 10#.  Forcing food for 6 weeks. Idleness is not good for worry but can't focus well enough to read as much as she would usually otherwise bc anxiety interferes. Taking alprazolam 0.5 mg in pm to help eat dinner.  Getting from [place]. No med changes  05/18/20 appt with the following noted:  Another urgent appt DT severity  of anxiety. On clomipramine 50 mg since about 5/14. Appetite not better yet.  Lost total 12-13#.   Not depressed. Feels sleepy.  Always tired.  Wonders if there's medical cause. Sleeping a lot with Seroquel 50 mg HS. Notices less anxiety by "a little".   A little more obsessions since being at home but not severe. Not currently physically able to do the job and embarrassed about being out of work.  Disc concerns about her age and work and wondering about retirement from it fully.  No med changes.  06/07/2020 appointment with the following noted: Sleeping well with clonazepam 2 mg HS and quetiapine 50 mg HS. Still on clomipramine 50 since 04/21/20. Still exhausted from going to grocery store.  Not mentally exhausted but is physically.   Has remained on medical leave but not covered by FMLA bc not working FT. Decided to resign.  Doesn't feel able to do it yet.  Hopes to get to volunteer in the fall. Naps a good deal. Plan: Continue clomipramine 50 mg nightly (started about 5/14) Continue Klonopin 2 mg HS Continue Seroquel 25-50 mg for sleep and anxiety and rumination .  Try reducing as soon as she can to help energy. Ok continue Xanax daytime for anxiety and allow her to eat..  Meds won't work if you don't get enough protein.  08/10/20 appt with the following noted:  Nothing to do but gave job up bc didn't think she could do it physically. Did clean her house well.   Clomipramine has kicked in but hard time bc nothing to divert her attention.   No anxiety until married. After 5-6 years later got osbessions.  Never compulsions. Not depressed or despairing anymore. Thinking of 12 hour a week job and needs it to get out of the house.  Needs stimulation and activity. Plan: no med changes.  11/13/2020 appointment with the following noted: Stopped quetiapine a week ago.  Sleep is OK with a little more awakening but not severe.   Some chronic OCD but minimal some chronic anxiety but manageable.   Not depressed.  Sleep is adequate Son [name] Cancer doing well with promotion at [place] [employer] people. Surprisingly well. Still has compulsions including night eating.  Married since 2016.  Hasn't travelled lately but wants to.  Enjoys [place]. [place].  Has a dog. Plan no med changes:   03/15/2021 appointment with the following noted: A lot of changes in the last year.  Health decline in physical ability to walk.  No particular reason. Lost job and friends at the job.  Lost dog of 12 years.  B died in 01/07/2023, lived in [place].   Was going to volunteer but didn't DT covid. Dx advancing COPD despite not smoking.  This led to decision not to volunteer at  school. Clonazepam helps her and needs the 2 mg dose.  Afraid to reduce it.  Insurance won't cover. Needed to increase clonazepam to 2 mg HS 1 year ago and sleeping fine. No seroquel needed. Not depressed. Son eccentric but genius. Continue clomipramine 50 mg nightly (started about 04/2013) Continue Klonopin 2 mg HS for now but hope to gradually reduce it with goal of about 1 mg eventually.  Unlikely to be able to stop it. Disc way to reduce it in detail. No  med changes today.   09/13/2021 appointment with the following noted: Still can't find things to do or places to volunteer.  Need something to do.   Son [name] Cancer works for [employer] and works from home. OCD manageable but not 100% gone. Disc concerns about heart rate.  It's less than 100. Not exercising, but used to. EKG yearly but still worries her. Patient reports stable mood and denies depressed or irritable moods.  Patient denies any recent difficulty with anxiety.  Patient denies difficulty with sleep initiation or maintenance. Denies appetite disturbance.  Patient reports that energy and motivation have been good.  Patient denies any difficulty with concentration.  Patient denies any suicidal ideation.   Past Psychiatric Medication Trials:  Clomipramine,   Poor response SSRIs   Trazodone 100 NR Seroquel 75 hangover Clonazepam 1-2 mg HS  Review of Systems:  Review of Systems  Constitutional:  Positive for fatigue. Negative for appetite change and unexpected weight change.       Lost 10-12 # in 3 weeks  Respiratory:  Positive for shortness of breath.   Gastrointestinal:  Negative for abdominal pain.  Neurological:  Negative for dizziness, tremors, weakness and headaches.  Psychiatric/Behavioral:  Negative for agitation, behavioral problems, confusion, decreased concentration, dysphoric mood, hallucinations, self-injury, sleep disturbance and suicidal ideas. The patient is nervous/anxious. The patient is not hyperactive.    Medications: I have reviewed the patient's current medications.  Current Outpatient Medications  Medication Sig Dispense Refill   atenolol (TENORMIN) 25 MG tablet TAKE 1 TABLET DAILY FOR BLOOD PRESSURE. 90 tablet 3   Calcium 1500 MG tablet Take 1,500 mg by mouth daily.      ezetimibe (ZETIA) 10 MG tablet Take 1 tablet Daily for Cholesterol 90 tablet 3   fish oil-omega-3 fatty acids 1000 MG capsule Take 1 g by mouth daily.     magnesium 30 MG tablet Take 30 mg by mouth daily.      Multiple Vitamin (MULTIVITAMIN) tablet Take 1 tablet by mouth daily.     Multiple Vitamins-Minerals (EYE VITAMINS PO) Take 1 capsule by mouth daily. Ocusight Vitamins     omeprazole (PRILOSEC) 40 MG capsule Take 1 capsule (40 mg total) by mouth daily. 30 capsule 11   vitamin C (ASCORBIC ACID) 500 MG tablet Take 500 mg by mouth daily.     VITAMIN D PO Take 5,000 Units by mouth. Takes 1 to 2 capsules daily.     clomiPRAMINE (ANAFRANIL) 50 MG capsule Take 1 capsule (50 mg total) by mouth at bedtime. 90 capsule 2   clonazePAM (KLONOPIN) 2 MG tablet Take 1 tablet (2 mg total) by mouth at bedtime. 90 tablet 1   No current facility-administered medications for this visit.    Medication Side Effects: None, dry  Allergies:  Allergies  Allergen Reactions   Prednisone      Altered mental state   Penicillins    Asa [Aspirin] Diarrhea   Levaquin [Levofloxacin] Other (See Comments)    Joint Pain  Macrolides And Ketolides Nausea And Vomiting   Paxil [Paroxetine Hcl] Nausea And Vomiting   Tetracyclines & Related Nausea And Vomiting   Zoloft [Sertraline Hcl] Nausea And Vomiting   Sulfa Antibiotics Rash   Trimethoprim Rash    Past Medical History:  Diagnosis Date   Anemia    mild   Anxiety    Colon polyp    hyperplastic   Diverticulosis    Esophageal stricture    GERD (gastroesophageal reflux disease)    no s/s now- off medicines   Hyperlipidemia    Palpitations    Prediabetes    Tachycardia     Family History  Problem Relation Age of Onset   Kidney cancer Father    Hypertension Mother    Heart disease Mother    Colon cancer Paternal Aunt    Colon polyps Neg Hx    Esophageal cancer Neg Hx    Rectal cancer Neg Hx    Stomach cancer Neg Hx     Social History   Socioeconomic History   Marital status: Married    Spouse name: Not on file   Number of children: 1   Years of education: Not on file   Highest education level: Not on file  Occupational History   Occupation: Librarian  Tobacco Use   Smoking status: Never   Smokeless tobacco: Never  Vaping Use   Vaping Use: Never used  Substance and Sexual Activity   Alcohol use: No    Alcohol/week: 0.0 standard drinks   Drug use: No   Sexual activity: Not on file  Other Topics Concern   Not on file  Social History Narrative   Not on file   Social Determinants of Health   Financial Resource Strain: Not on file  Food Insecurity: Not on file  Transportation Needs: Not on file  Physical Activity: Not on file  Stress: Not on file  Social Connections: Not on file  Intimate Partner Violence: Not on file    Past Medical History, Surgical history, Social history, and Family history were reviewed and updated as appropriate.   Son has severe anxiety and is treated also.  Please  see review of systems for further details on the patient's review from today.   Objective:   Physical Exam:  LMP 12/09/1994   Physical Exam Constitutional:      General: She is not in acute distress.    Appearance: She is well-developed.  Musculoskeletal:        General: No deformity.  Neurological:     Mental Status: She is alert and oriented to person, place, and time.     Motor: No tremor.     Coordination: Coordination normal.     Gait: Gait normal.  Psychiatric:        Attention and Perception: She is attentive. She does not perceive auditory hallucinations.        Mood and Affect: Mood is anxious. Mood is not depressed. Affect is not labile, blunt, angry, tearful or inappropriate.        Speech: Speech normal. Speech is not slurred.        Behavior: Behavior normal.        Thought Content: Thought content normal. Thought content does not include homicidal or suicidal ideation. Thought content does not include homicidal or suicidal plan.        Cognition and Memory: Cognition normal.        Judgment: Judgment normal.     Comments: Insight intact. Residual  obsessions if not busy.. No auditory or visual hallucinations. No delusions.  No rumination  Neat .  No sedation. Pleasant and talkative, fluent     Lab Review:     Component Value Date/Time   NA 139 08/20/2021 1455   K 4.2 08/20/2021 1455   CL 104 08/20/2021 1455   CO2 30 08/20/2021 1455   GLUCOSE 87 08/20/2021 1455   BUN 8 08/20/2021 1455   CREATININE 0.87 08/20/2021 1455   CALCIUM 9.8 08/20/2021 1455   PROT 6.9 08/20/2021 1455   ALBUMIN 4.0 05/13/2017 1510   AST 10 08/20/2021 1455   ALT 16 08/20/2021 1455   ALKPHOS 61 05/13/2017 1510   BILITOT 0.6 08/20/2021 1455   GFRNONAA 77 02/15/2021 1546   GFRAA 89 02/15/2021 1546       Component Value Date/Time   WBC 5.7 08/20/2021 1455   RBC 4.77 08/20/2021 1455   HGB 14.0 08/20/2021 1455   HCT 42.3 08/20/2021 1455   PLT 230 08/20/2021 1455   MCV 88.7  08/20/2021 1455   MCH 29.4 08/20/2021 1455   MCHC 33.1 08/20/2021 1455   RDW 12.7 08/20/2021 1455   LYMPHSABS 2,434 08/20/2021 1455   MONOABS 0.8 12/18/2018 1628   EOSABS 171 08/20/2021 1455   BASOSABS 40 08/20/2021 1455    No results found for: POCLITH, LITHIUM   No results found for: PHENYTOIN, PHENOBARB, VALPROATE, CBMZ   .res Assessment: Plan:    Major depression, recurrent, full remission ([place])  Mixed obsessional thoughts and acts - Plan: clomiPRAMINE (ANAFRANIL) 50 MG capsule  Generalized anxiety disorder - Plan: clomiPRAMINE (ANAFRANIL) 50 MG capsule  Insomnia due to mental condition - Plan: clonazePAM (KLONOPIN) 2 MG tablet  Tachycardia    Marked  worsening of anxiety and depresssion without obvious worsening of OCD after STOPPING CLOMIPRAMINE ACCIDENTALLY  in 2020.  But cannot sleep without clonazepam 2 mg HS.  Disc pros and cons of reducing the dose.  Dose is high but she rears reduction.  Disc risks TCA including cardiac and risk increasing resting heart rate.  We discussed the short-term risks associated with benzodiazepines including sedation and increased fall risk among others.  Discussed long-term side effect risk including dependence, potential withdrawal symptoms, and the potential eventual dose-related risk of dementia.  But recent studies from 2020 dispute this association between benzodiazepines and dementia risk. Newer studies in 2020 do not support an association with dementia. Disc her fears of addiction.  Wants to eventually consider tapering off of it using the quetiapine to protect her sleep.   Supportive therapy on relapse prevention and what to do to deal with her job and anxiety. Keep herself occupied in retirement.  Disc this again and she's looking for volunteer position.  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       Enc exercise.  Start back.  She doesn't feel capable yet but did before. E  Continue clomipramine 50 mg nightly (started about  04/2013) Continue Klonopin 2 mg HS for now but hope to gradually reduce it with goal of about 1 mg eventually.  Unlikely to be able to stop it. Disc way to reduce it in detail.  No  med changes today.   This appt was 30 mins.  FU 3-4 mos   Lynder Parents, MD, DFAPA   Please see After Visit Summary for patient specific instructions.  Future Appointments  Date Time Provider [place]  02/18/2022  2:30 PM Liane Comber, NP [facility] None  08/20/2022  3:00 PM  Unk Pinto, MD [facility] None    No orders of the defined types were placed in this encounter.     -------------------------------

## 2021-09-25 ENCOUNTER — Other Ambulatory Visit: Payer: Self-pay

## 2021-09-25 DIAGNOSIS — Z1211 Encounter for screening for malignant neoplasm of colon: Secondary | ICD-10-CM

## 2021-09-25 LAB — POC HEMOCCULT BLD/STL (HOME/3-CARD/SCREEN)
Card #2 Fecal Occult Blod, POC: NEGATIVE
Card #3 Fecal Occult Blood, POC: NEGATIVE
Fecal Occult Blood, POC: NEGATIVE

## 2022-01-24 ENCOUNTER — Ambulatory Visit
Admission: RE | Admit: 2022-01-24 | Discharge: 2022-01-24 | Disposition: A | Discharge status: Home / Self Care | Payer: Medicare PPO | Source: Ambulatory Visit | Attending: Internal Medicine | Admitting: Internal Medicine

## 2022-01-24 ENCOUNTER — Other Ambulatory Visit: Payer: Self-pay

## 2022-01-24 ENCOUNTER — Ambulatory Visit: Payer: Medicare PPO | Admitting: Internal Medicine

## 2022-01-24 ENCOUNTER — Encounter: Payer: Self-pay | Admitting: Internal Medicine

## 2022-01-24 VITALS — BP 104/60 | HR 86 | Temp 97.9°F | Resp 16 | Ht 66.0 in | Wt 153.6 lb

## 2022-01-24 DIAGNOSIS — J449 Chronic obstructive pulmonary disease, unspecified: Secondary | ICD-10-CM | POA: Diagnosis not present

## 2022-01-24 DIAGNOSIS — R5383 Other fatigue: Secondary | ICD-10-CM | POA: Diagnosis not present

## 2022-01-24 DIAGNOSIS — R0609 Other forms of dyspnea: Secondary | ICD-10-CM | POA: Diagnosis not present

## 2022-01-24 DIAGNOSIS — Z79899 Other long term (current) drug therapy: Secondary | ICD-10-CM | POA: Diagnosis not present

## 2022-01-24 DIAGNOSIS — E611 Iron deficiency: Secondary | ICD-10-CM | POA: Diagnosis not present

## 2022-01-24 DIAGNOSIS — E538 Deficiency of other specified B group vitamins: Secondary | ICD-10-CM

## 2022-01-24 DIAGNOSIS — R059 Cough, unspecified: Secondary | ICD-10-CM | POA: Diagnosis not present

## 2022-01-24 DIAGNOSIS — R052 Subacute cough: Secondary | ICD-10-CM

## 2022-01-24 DIAGNOSIS — I10 Essential (primary) hypertension: Secondary | ICD-10-CM | POA: Diagnosis not present

## 2022-01-24 NOTE — Patient Instructions (Signed)
Weakness Weakness is a lack of strength. You may feel weak all over your body (generalized), or you may feel weak in one specific part of your body (focal). Common causes of weakness include: Infection and immune system disorders. Physical exhaustion. Internal bleeding or other blood loss that results in a lack of red blood cells (anemia). Dehydration. An imbalance in mineral (electrolyte) levels, such as potassium. Heart disease, circulation problems, or stroke. Other causes include: Some medicines or cancer treatment. Stress, anxiety, or depression. Nervous system disorders. Thyroid disorders. Loss of muscle strength because of age or inactivity. Poor sleep quality or sleep disorders. The cause of your weakness may not be known. Some causes of weakness can be serious, so it is important to see your health care provider. Follow these instructions at home: Activity Rest as needed. Try to get enough sleep. Most adults need 7-8 hours of quality sleep each night. Talk to your health care provider about how much sleep you need each night. Do exercises, such as arm curls and leg raises, for 30 minutes at least 2 days a week or as told by your health care provider. This helps build muscle strength. Consider working with a physical therapist or trainer who can develop an exercise plan to help you gain muscle strength. General instructions  Take over-the-counter and prescription medicines only as told by your health care provider. Eat a healthy, well-balanced diet. This includes: Proteins to build muscles, such as lean meats and fish. Fresh fruits and vegetables. Carbohydrates to boost energy, such as whole grains. Drink enough fluid to keep your urine pale yellow. Keep all follow-up visits as told by your health care provider. This is important. Contact a health care provider if your weakness: Does not improve or gets worse. Affects your ability to think clearly. Affects your ability to do  your normal daily activities. Get help right away if you: Develop sudden weakness, especially on one side of your face or body. Have chest pain. Have trouble breathing or shortness of breath. Have problems with your vision. Have trouble talking or swallowing. Have trouble standing or walking. Are light-headed or lose consciousness. Summary Weakness is a lack of strength. You may feel weak all over your body or just in one specific part of your body. Weakness can be caused by a variety of things. In some cases, the cause may be unknown. Rest as needed, and try to get enough sleep. Most adults need 7-8 hours of quality sleep each night. Eat a healthy, well-balanced diet. This information is not intended to replace advice given to you by your health care provider. Make sure you discuss any questions you have with your health care provider. Document Revised: 07/01/2018 Document Reviewed: 07/01/2018 Elsevier Patient Education  [place].

## 2022-01-24 NOTE — Progress Notes (Addendum)
Future Appointments  Date Time Provider Department  01/24/2022 11:30 AM Unk Pinto, MD [facility]  02/12/2022            Wellness 11:00 AM Liane Comber, NP [facility]  05/14/2022  4:00 PM Cottle, Billey Co., MD [facility]  08/20/2022  3:00 PM Unk Pinto, MD [facility]    History of Present Illness:     Patient is a very nice 76 yo MWF presenting with c/o fatigue. She does also have long hx/o Depression & continues f/u with Dr Clovis Pu. Today patient relates a 2 year hx/o "low energy", mild DOE which she admits is due to lack of exercise & deconditioning, occasional back pain . She also has long standing c/o palpitations.  She does report occasional dry non-productive cough. Systems review is generally negative.     Medications    atenolol (TENORMIN) 25 MG tablet, TAKE 1 TABLET DAILY    ezetimibe (ZETIA) 10 MG tablet, Take 1 tablet Daily for Cholesterol \  Calcium 1500 MG tablet, Take  daily.    clomiPRAMINE (ANAFRANIL) 50 MG capsule, Take 1 capsule at bedtime.   clonazePAM (KLONOPIN) 2 MG tablet, Take 1 tablet  at bedtime.   fish oil-omega-3 fatty acids 1000 MG capsule, Take daily.   magnesium 30 MG tablet, Take 3    daily.    Multiple Vitamin  Take 1 tablet by mouth daily.   Ocusight Vitamins, Take 1 capsule daily.    omeprazole (PRILOSEC) 40 MG capsule, Take 1 capsule  daily.   vitamin C (ASCORBIC ACID) 500 MG tablet, Take daily.   VITAMIN D PO, Take 5,000 Units by mouth. Takes 1 to 2 capsules daily.  Problem list She has ESOPHAGEAL STRICTURE; GERD; PERSONAL HX COLONIC POLYPS; Osteopenia; Hyperlipidemia; Abnormal glucose; Vitamin D deficiency; BMI 23.0-23.9, adult; FHx: heart disease; OCD (obsessive compulsive disorder); Flying phobia; Essential hypertension; and Pulmonary emphysema (HCC) on their problem list.   Observations/Objective:  BP 104/60   Pulse 86   Temp 97.9 F (36.6 C)   Resp 16   Ht 5\' 6"  (1.676 m)   Wt 153 lb 9.6 oz (69.7 kg)   LMP 12/09/1994   SpO2  96%   BMI 24.79 kg/m   HEENT - WNL. Neck - supple.  Chest - Clear equal BS. Cor - Nl HS. RRR w/o sig MGR. PP 1(+). No edema. MS- FROM w/o deformities.  Gait Nl. Neuro -  Nl w/o focal abnormalities.   Assessment and Plan:  1. Essential hypertension  - CBC with Differential/Platelet - COMPLETE METABOLIC PANEL WITH GFR - Magnesium - TSH - Urinalysis, Routine w reflex microscopic  2. Fatigue, unspecified type  - CBC with Differential/Platelet - COMPLETE METABOLIC PANEL WITH GFR - Magnesium - TSH - Urinalysis, Routine w reflex microscopic  3. Vitamin B12 deficiency  - CBC with Differential/Platelet - Vitamin B12  4. Iron deficiency  - CBC with Differential/Platelet - Iron, Total/Total Iron Binding Cap  5. Medication management  - CBC with Differential/Platelet - COMPLETE METABOLIC PANEL WITH GFR - Magnesium - TSH - Urinalysis, Routine w reflex microscopic - Iron, Total/Total Iron Binding Cap - Vitamin B12  6. Dyspnea on exertion  - DG Chest 2 View; Future  7. Cough  - DG Chest 2 View; Future   Follow Up Instructions:        I discussed the assessment and treatment plan with the patient. The patient was provided an opportunity to ask questions and all were answered. The patient agreed with the  plan and demonstrated an understanding of the instructions.       The patient was advised to call back or seek an in-person evaluation if the symptoms worsen or if the condition fails to improve as anticipated.    Kirtland Bouchard, MD

## 2022-01-25 ENCOUNTER — Other Ambulatory Visit: Payer: Self-pay | Admitting: Internal Medicine

## 2022-01-25 LAB — CBC WITH DIFFERENTIAL/PLATELET
Absolute Monocytes: 554 cells/uL (ref 200–950)
Basophils Absolute: 50 cells/uL (ref 0–200)
Basophils Relative: 0.8 %
Eosinophils Absolute: 252 cells/uL (ref 15–500)
Eosinophils Relative: 4 %
HCT: 42.6 % (ref 35.0–45.0)
Hemoglobin: 13.9 g/dL (ref 11.7–15.5)
Lymphs Abs: 2268 cells/uL (ref 850–3900)
MCH: 29.3 pg (ref 27.0–33.0)
MCHC: 32.6 g/dL (ref 32.0–36.0)
MCV: 89.7 fL (ref 80.0–100.0)
MPV: 9.8 fL (ref 7.5–12.5)
Monocytes Relative: 8.8 %
Neutro Abs: 3175 cells/uL (ref 1500–7800)
Neutrophils Relative %: 50.4 %
Platelets: 234 10*3/uL (ref 140–400)
RBC: 4.75 10*6/uL (ref 3.80–5.10)
RDW: 12.6 % (ref 11.0–15.0)
Total Lymphocyte: 36 %
WBC: 6.3 10*3/uL (ref 3.8–10.8)

## 2022-01-25 LAB — IRON, TOTAL/TOTAL IRON BINDING CAP
%SAT: 22 % (calc) (ref 16–45)
Iron: 68 ug/dL (ref 45–160)
TIBC: 310 mcg/dL (calc) (ref 250–450)

## 2022-01-25 LAB — URINALYSIS, ROUTINE W REFLEX MICROSCOPIC
Bacteria, UA: NONE SEEN /HPF
Bilirubin Urine: NEGATIVE
Glucose, UA: NEGATIVE
Hgb urine dipstick: NEGATIVE
Hyaline Cast: NONE SEEN /LPF
Ketones, ur: NEGATIVE
Nitrite: NEGATIVE
Protein, ur: NEGATIVE
Specific Gravity, Urine: 1.019 (ref 1.001–1.035)
Squamous Epithelial / HPF: NONE SEEN /HPF (ref <=5)
pH: 6 (ref 5.0–8.0)

## 2022-01-25 LAB — COMPLETE METABOLIC PANEL WITH GFR
AG Ratio: 1.9 (calc) (ref 1.0–2.5)
ALT: 12 U/L (ref 6–29)
AST: 9 U/L — ABNORMAL LOW (ref 10–35)
Albumin: 4.2 g/dL (ref 3.6–5.1)
Alkaline phosphatase (APISO): 73 U/L (ref 37–153)
BUN: 9 mg/dL (ref 7–25)
CO2: 27 mmol/L (ref 20–32)
Calcium: 9.3 mg/dL (ref 8.6–10.4)
Chloride: 105 mmol/L (ref 98–110)
Creat: 0.8 mg/dL (ref 0.60–1.00)
Globulin: 2.2 g/dL (calc) (ref 1.9–3.7)
Glucose, Bld: 85 mg/dL (ref 65–99)
Potassium: 3.9 mmol/L (ref 3.5–5.3)
Sodium: 140 mmol/L (ref 135–146)
Total Bilirubin: 0.4 mg/dL (ref 0.2–1.2)
Total Protein: 6.4 g/dL (ref 6.1–8.1)
eGFR: 77 mL/min/{1.73_m2} (ref >=60)

## 2022-01-25 LAB — VITAMIN B12: Vitamin B-12: 566 pg/mL (ref 200–1100)

## 2022-01-25 LAB — TSH: TSH: 2.04 mIU/L (ref 0.40–4.50)

## 2022-01-25 LAB — MICROSCOPIC MESSAGE

## 2022-01-25 LAB — MAGNESIUM: Magnesium: 2 mg/dL (ref 1.5–2.5)

## 2022-01-25 MED ORDER — AMPHETAMINE-DEXTROAMPHETAMINE 10 MG PO TABS: ORAL_TABLET | ORAL | 0 refills | Status: DC

## 2022-01-25 NOTE — Progress Notes (Signed)
=============================================================== °-   Test results slightly outside the reference range are not unusual. If there is anything important, I will review this with you,  otherwise it is considered normal test values.  If you have further questions,  please do not hesitate to contact me at the office or via My Chart.  =============================================================== ===============================================================  -  CXR is OK   -  U/A looks  OK   -  CBC, Kidney, Liver, Thyroid , Iron & B12 levels - All Normal & OK   -  So no obvious explanation for your complaints of Fatigue, except                                                                    maybe  Sleep  Disorder or Depression  -  Do you have hx of Snoring or stopping breathing while sleeping ( apnea spells )    -  I sent in a medicine  called   Adderall for you to try to see if it helps .

## 2022-01-25 NOTE — Progress Notes (Signed)
Chest Xray - Normal & OK - No sign of heart problems, pneumonia or Cancer

## 2022-02-08 NOTE — Progress Notes (Unsigned)
MEDICARE ANNUAL WELLNESS VISIT AND FOLLOW UP Assessment:   Diagnoses and all orders for this visit:  Annual Medicare Wellness Visit Due annually  Health maintenance reviewed ***  Essential hypertension / tachycardia Continue medications Monitor blood pressure at home; call if consistently over 130/80 Continue DASH diet.   Reminder to go to the ER if any CP, SOB, nausea, dizziness, severe HA, changes vision/speech, left arm numbness and tingling and jaw pain.  Gastroesophageal reflux disease, esophagitis presence not specified Doing well on current regimen Continue omeprazole 40mg  daily, continue PPI due to esophagitis/stricture hx  ESOPHAGEAL STRICTURE Doing well at this time Continue to monitor symptoms Follows with Dr Henrene Pastor Q6 months   Osteopenia, unspecified location Taking Calcium and Vitamin D DEXA scan due, referral placed- schedule at [facility] ***  Mixed hyperlipidemia Continue medications: Zetia 10mg , fish oil 1,000mg  daily Continue low cholesterol diet and exercise.  Check lipid panel next visit  Mixed obsession thoughts and acts Doing well at this time Taking anafranil 50mg  HS Clonazepam 1mg  HS Follows with Dr Clovis Pu  Vitamin D deficiency Continue supplementation Will check level next office visit  Abnormal glucose Discussed dietary and exercise modifications  History of colon polyps Dr. Henrene Pastor follows, due colonoscopy 02/2021 - she will contact GI ***  Fatigue ***  BMI 22 *** Continue to recommend diet heavy in fruits and veggies and low in animal meats, cheeses, and dairy products, appropriate calorie intake Discuss exercise recommendations routinely Continue to monitor weight at each visit    Follow Up Instructions:    I discussed the assessment and treatment plan with the patient. The patient was provided an opportunity to ask questions and all were answered. The patient agreed with the plan and demonstrated an understanding of the  instructions.   The patient was advised to call back or seek an in-person evaluation if the symptoms worsen or if the condition fails to improve as anticipated.  I provided 30 minutes of non-face-to-face time during this encounter including counseling, chart review, and critical decision making was preformed.   Future Appointments  Date Time Provider [place]  02/12/2022 11:00 AM Liane Comber, NP [facility] None  05/14/2022  4:00 PM Cottle, Billey Co., MD [facility] None  08/20/2022  3:00 PM Unk Pinto, MD [facility] None      Plan:   During the course of the visit the patient was educated and counseled about appropriate screening and preventive services including:   Pneumococcal vaccine  Influenza vaccine Prevnar 13 Td vaccine Screening electrocardiogram, deferred, telephone visit [place]. Colorectal cancer screening Diabetes screening Glaucoma screening Nutrition counseling    Subjective:  Donna Dillon is a 76 y.o. female who presents for Medicare Annual Wellness Visit and 3 month follow up. She has ESOPHAGEAL STRICTURE; GERD; PERSONAL HX COLONIC POLYPS; Osteopenia; Hyperlipidemia; Abnormal glucose; Vitamin D deficiency; BMI 23.0-23.9, adult; FHx: heart disease; OCD (obsessive compulsive disorder); Flying phobia; Essential hypertension; and Pulmonary emphysema ([place]) - per CXR 12/2018 on their problem list.   Recently saw Dr. Jerilynn Mages 01/24/2022 for fatigue and had lots of labs *** c/o fatigue. She does also have hong hx/o Depression & continues f/u with Dr Clovis Pu. Today patient relates a 2 year hx/o "low energy", mild DOE which she admits is due to lack of exercise & deconditioning, occasional back pain . She also has long standing c/o palpitations.  She does report occasional dry non-productive cough. Systems review is generally negative.    CXR, CBC, CMP, TSH, UA, iron panel and B12 were  negative Questioned depression or sleep issue, he prescribed trial of adderall  ***   Does have COPD/emphysematous changes on xray 12/18/2018, again in 01/24/2022.  Not on inhalers, denies sx other than with URI. Reports post nasal drip triggers bronchitis episodes.   She has hx of colon polyps, GERD with stricture (had last dilation 11/11/2019), followed by Dr. Henrene Pastor.  Last EGD 2020 was benign. She is due for colonoscopy 02/2021 ***  She is followed by psych Dr. Clovis Pu for OCD, on anafranil and PRN klonopin.   BMI is There is no height or weight on file to calculate BMI., she has been working on diet and exercise. She does walking 15-20 2-3 days a week.  Wt Readings from Last 3 Encounters:  01/24/22 153 lb 9.6 oz (69.7 kg)  08/21/21 153 lb 9.6 oz (69.7 kg)  08/20/21 152 lb (68.9 kg)   History of hypertension that predates 38.  Her blood pressure has been controlled at home, today their BP is    She does workout. She denies chest pain, shortness of breath, dizziness.   She is on cholesterol medication and denies myalgias. She is taking zetia, side effects with statins, unable to tolerate. Her cholesterol is not at goal. The cholesterol last visit was:   Lab Results  Component Value Date   CHOL 195 08/20/2021   HDL 65 08/20/2021   LDLCALC 102 (H) 08/20/2021   TRIG 161 (H) 08/20/2021   CHOLHDL 3.0 08/20/2021   She has been working on diet and exercise for hx of prediabetes (A1c 5.8%, 2011, recently well controlled after weight), and denies hyperglycemia, hypoglycemia , increased appetite, nausea, paresthesia of the feet, polydipsia, polyuria, visual disturbances and vomiting. Last A1C in the office was:  Lab Results  Component Value Date   HGBA1C 5.3 08/20/2021   Last GFR Lab Results  Component Value Date   GFRNONAA 77 02/15/2021    Patient is on Vitamin D supplement.  Deficiency noted (36/2008).  Last Vitamin D level was in normal range but below goal of 70-90. Lab Results  Component Value Date   VD25OH 73 08/20/2021       Medication  Review:   Current Outpatient Medications (Cardiovascular):    atenolol (TENORMIN) 25 MG tablet, TAKE 1 TABLET DAILY FOR BLOOD PRESSURE.   ezetimibe (ZETIA) 10 MG tablet, Take 1 tablet Daily for Cholesterol     Current Outpatient Medications (Other):    amphetamine-dextroamphetamine (ADDERALL) 10 MG tablet, Take 1 tablet 2 x /day  at   8 am   &   2 pm   Calcium 1500 MG tablet, Take 1,500 mg by mouth daily.    clomiPRAMINE (ANAFRANIL) 50 MG capsule, Take 1 capsule (50 mg total) by mouth at bedtime.   clonazePAM (KLONOPIN) 2 MG tablet, Take 1 tablet (2 mg total) by mouth at bedtime.   fish oil-omega-3 fatty acids 1000 MG capsule, Take 1 g by mouth daily.   magnesium 30 MG tablet, Take 30 mg by mouth daily.    Multiple Vitamin (MULTIVITAMIN) tablet, Take 1 tablet by mouth daily.   Multiple Vitamins-Minerals (EYE VITAMINS PO), Take 1 capsule by mouth daily. [employer] Vitamins   omeprazole (PRILOSEC) 40 MG capsule, Take 1 capsule (40 mg total) by mouth daily.   vitamin C (ASCORBIC ACID) 500 MG tablet, Take 500 mg by mouth daily.   VITAMIN D PO, Take 5,000 Units by mouth. Takes 1 to 2 capsules daily.  Allergies: Allergies  Allergen Reactions   Prednisone  Altered mental state   Penicillins    Asa [Aspirin] Diarrhea   Levaquin [Levofloxacin] Other (See Comments)    Joint Pain   Macrolides And Ketolides Nausea And Vomiting   Paxil [Paroxetine Hcl] Nausea And Vomiting   Tetracyclines & Related Nausea And Vomiting   Zoloft [Sertraline Hcl] Nausea And Vomiting   Sulfa Antibiotics Rash   Trimethoprim Rash    Current Problems (verified) has ESOPHAGEAL STRICTURE; GERD; PERSONAL HX COLONIC POLYPS; Osteopenia; Hyperlipidemia; Abnormal glucose; Vitamin D deficiency; BMI 23.0-23.9, adult; FHx: heart disease; OCD (obsessive compulsive disorder); Flying phobia; Essential hypertension; and Pulmonary emphysema ([place]) - per CXR 12/2018 on their problem list.  Screening Tests Immunization History   Administered Date(s) Administered   Influenza, High Dose Seasonal PF 11/14/2014, 10/10/2016, 12/03/2017, 11/03/2018   PFIZER(Purple Top)SARS-COV-2 Vaccination 02/24/2020, 03/20/2020   Pneumococcal Conjugate-13 09/21/2015   Pneumococcal Polysaccharide-23 02/14/2009, 05/13/2017   Td 12/11/2006, 12/03/2017   Tdap 12/09/2005   Zoster, Live 02/27/2011   Health Maintenance  Topic Date Due   Zoster Vaccines- Shingrix (1 of 2) Never done   DEXA SCAN  05/14/2015   COVID-19 Vaccine (3 - Pfizer risk series) 04/17/2020   MAMMOGRAM  07/08/2020   COLONOSCOPY (Pts 45-55yrs Insurance coverage will need to be confirmed)  03/05/2021   INFLUENZA VACCINE  07/09/2021   TETANUS/TDAP  12/04/2027   Pneumonia Vaccine 50+ Years old  Completed   Hepatitis C Screening  Completed   HPV VACCINES  Aged Out    Preventative care: Last colonoscopy: 2017, Dr Henrene Pastor, polyps, 5 year recall due 02/2021 - patient will contact EGD; 2020 Mammogram: [facility], DUE ***  DEXA: 2014, osteopenia DUE - ordered to schedule  Names of Other Physician/Practitioners you currently use: 1. [facility] here for primary care 2. Eye Exam, Dr. ?, Dr. Elder Negus office, 2021 3.Dental Exam, Dr Barrie Dunker, 2021  Patient Care Team: Unk Pinto, MD as PCP - General (Internal Medicine) Clent Jacks, MD as Consulting Physician (Ophthalmology) Irene Shipper, MD as Consulting Physician (Gastroenterology) Milus Banister, MD as Attending Physician (Gastroenterology) Megan Salon, MD as Consulting Physician (Gynecology)  Surgical: She  has a past surgical history that includes Tonsillectomy; Breast biopsy (Left); Cesarean section; Upper gastrointestinal endoscopy; Polypectomy; eye lift; and Colonoscopy (2017). Family Her family history includes Colon cancer in her paternal aunt; Heart disease in her mother; Hypertension in her mother; Kidney cancer in her father. Social history  She reports that  she has never smoked. She has never used smokeless tobacco. She reports that she does not drink alcohol and does not use drugs.  MEDICARE WELLNESS OBJECTIVES: Physical activity:   Cardiac risk factors:   Depression/mood screen:   Depression screen [facility] 2/9 02/15/2021  Decreased Interest 0  Down, Depressed, Hopeless 0  PHQ - 2 Score 0    ADLs:  In your present state of health, do you have any difficulty performing the following activities: 02/15/2021  Hearing? N  Vision? N  Difficulty concentrating or making decisions? N  Walking or climbing stairs? N  Dressing or bathing? N  Doing errands, shopping? N  Some recent data might be hidden     Cognitive Testing  Alert? Yes  Normal Appearance?Yes  Oriented to person? Yes  Place? Yes   Time? Yes  Recall of three objects?  Yes  Can perform simple calculations? Yes  Displays appropriate judgment?Yes  Can read the correct time from a watch face?Yes  EOL planning:     Objective:  There were no vitals filed for this visit.  There is no height or weight on file to calculate BMI.  General Appearance: Well nourished, in no apparent distress. Eyes: conjunctiva no swelling or erythema ENT/Mouth: Mask in place; Hearing normal.  Neck: Supple, thyroid normal.  Respiratory: Respiratory effort normal, BS equal bilaterally without rales, rhonchi, wheezing or stridor.  Cardio: RRR with no MRGs. Brisk peripheral pulses without edema.  Abdomen: Soft, + BS.  Non tender, no guarding, rebound, hernias, masses. Lymphatics: Non tender without lymphadenopathy.  Musculoskeletal: No obvious deformity, normal gait.  Skin: Warm, dry without rashes, lesions, ecchymosis.  Neuro: Normal muscle tone Psych: Awake and oriented X 3, anxious affect, Insight and Judgment appropriate.    Medicare Attestation I have personally reviewed: The patient's medical and social history Their use of alcohol, tobacco or illicit drugs Their current medications and  supplements The patient's functional ability including ADLs,fall risks, home safety risks, cognitive, and hearing and visual impairment Diet and physical activities Evidence for depression or mood disorders  The patient's weight, height, BMI, and visual acuity have been recorded in the chart.  I have made referrals, counseling, and provided education to the patient based on review of the above and I have provided the patient with a written personalized care plan for preventive services.     Izora Ribas, NP 4:37 PM [facility]

## 2022-02-12 ENCOUNTER — Ambulatory Visit: Payer: Medicare PPO | Admitting: Adult Health

## 2022-02-12 DIAGNOSIS — M8580 Other specified disorders of bone density and structure, unspecified site: Secondary | ICD-10-CM

## 2022-02-12 DIAGNOSIS — Z0000 Encounter for general adult medical examination without abnormal findings: Secondary | ICD-10-CM

## 2022-02-12 DIAGNOSIS — I10 Essential (primary) hypertension: Secondary | ICD-10-CM

## 2022-02-12 DIAGNOSIS — K222 Esophageal obstruction: Secondary | ICD-10-CM

## 2022-02-12 DIAGNOSIS — F428 Other obsessive-compulsive disorder: Secondary | ICD-10-CM

## 2022-02-12 DIAGNOSIS — F40243 Fear of flying: Secondary | ICD-10-CM

## 2022-02-12 DIAGNOSIS — Z86010 Personal history of colonic polyps: Secondary | ICD-10-CM

## 2022-02-12 DIAGNOSIS — R7309 Other abnormal glucose: Secondary | ICD-10-CM

## 2022-02-12 DIAGNOSIS — E559 Vitamin D deficiency, unspecified: Secondary | ICD-10-CM

## 2022-02-12 DIAGNOSIS — Z6823 Body mass index (BMI) 23.0-23.9, adult: Secondary | ICD-10-CM

## 2022-02-12 DIAGNOSIS — K219 Gastro-esophageal reflux disease without esophagitis: Secondary | ICD-10-CM

## 2022-02-12 DIAGNOSIS — E782 Mixed hyperlipidemia: Secondary | ICD-10-CM

## 2022-02-12 DIAGNOSIS — J439 Emphysema, unspecified: Secondary | ICD-10-CM

## 2022-02-18 ENCOUNTER — Ambulatory Visit: Payer: Medicare PPO | Admitting: Adult Health

## 2022-03-04 ENCOUNTER — Other Ambulatory Visit: Payer: Self-pay | Admitting: Internal Medicine

## 2022-03-04 DIAGNOSIS — R000 Tachycardia, unspecified: Secondary | ICD-10-CM

## 2022-03-04 DIAGNOSIS — I10 Essential (primary) hypertension: Secondary | ICD-10-CM

## 2022-03-04 MED ORDER — ATENOLOL 25 MG PO TABS: ORAL_TABLET | ORAL | 3 refills | Status: DC

## 2022-03-05 ENCOUNTER — Other Ambulatory Visit: Payer: Self-pay | Admitting: Internal Medicine

## 2022-03-05 DIAGNOSIS — I10 Essential (primary) hypertension: Secondary | ICD-10-CM

## 2022-03-05 DIAGNOSIS — R000 Tachycardia, unspecified: Secondary | ICD-10-CM

## 2022-03-13 ENCOUNTER — Other Ambulatory Visit: Payer: Self-pay | Admitting: Psychiatry

## 2022-03-13 DIAGNOSIS — F5105 Insomnia due to other mental disorder: Secondary | ICD-10-CM

## 2022-05-14 ENCOUNTER — Encounter: Payer: Self-pay | Admitting: Psychiatry

## 2022-05-14 ENCOUNTER — Ambulatory Visit (INDEPENDENT_AMBULATORY_CARE_PROVIDER_SITE_OTHER): Payer: Medicare PPO | Admitting: Psychiatry

## 2022-05-14 DIAGNOSIS — F3342 Major depressive disorder, recurrent, in full remission: Secondary | ICD-10-CM | POA: Diagnosis not present

## 2022-05-14 DIAGNOSIS — F422 Mixed obsessional thoughts and acts: Secondary | ICD-10-CM

## 2022-05-14 DIAGNOSIS — F411 Generalized anxiety disorder: Secondary | ICD-10-CM | POA: Diagnosis not present

## 2022-05-14 DIAGNOSIS — F5105 Insomnia due to other mental disorder: Secondary | ICD-10-CM | POA: Diagnosis not present

## 2022-05-14 MED ORDER — CLONAZEPAM 1 MG PO TABS: 1.5000 mg | ORAL_TABLET | Freq: Every day | ORAL | 1 refills | Status: DC

## 2022-05-14 NOTE — Progress Notes (Signed)
[name] [id number] [birth year] 76 y.o.  Subjective:   Patient ID:  Donna Dillon is a 76 y.o. (DOB [birth year]) female.  Chief Complaint:  Chief Complaint  Patient presents with   Follow-up   Depression   Anxiety    Depression        Associated symptoms include fatigue.  Associated symptoms include no decreased concentration, no appetite change, no headaches and no suicidal ideas. Donna Dillon presents to the office today for follow-up of OCD.    seen December 2020.  No meds were changed.  seen Apr 10, 2020.  The following is noted: She is markedly worse. "falling apart".  More fatigued since dilation of esophagus.  January more dry eyes.  Hair falling out.  TSH is normal.   More sleep problems since Feb. Called and increased clonazepam to 2 mg HS bc also the insomnia was causing more anxiety.  Now also scared of taking the clonazepam.   Added trazodone 100 mg and it didn't help sleep. More obsessed with heart rate being elevated to 100 bpm.   Can't work DT anxiety and insomnia and fear over her heart. Scared.  She was ruminating. Plan:Increase atenolol for anxiety and tachycardia to 25 mg daily. DC trazodone Seroquel 25-50 mg for sleep Needs increased BZ .  She is afraid increasing the clonazepam but may be willing to take Xanax during the day.  Stay out of work this week. Disc option of Day treatment bc severity of sx and she's afraid of being alone. Option call Behavioral Health Assessment at the main [facility] phone.  Tell them I referred you to the Day treatment program. Return to office in 4 days.  04/14/2020 appointment the following is noted: Took 37.5 mg Seroquel Monday with 2 mg Klonopin and awoke in 3 hour. Afterwards usually took Seroquel 50 mg since then with Klonopin.  Lost another 1# and total of 11#/3 weeks.  Eating after taking alprazolam.  Can't quilt or read or focus on TV.  Heart rate is better after increase atenolol to 25 mg daily. Now 5-6 hours of  sleep. [name]. Plan: Start clomipramine 25 mg 1 capsule for 10 days, then 2 each night or 50 mg nightly. Continue Klonopin 2 mg HS Increase Seroquel 75 mg for sleep and anxiety and rumination Needs increased BZ .  She is afraid increasing the clonazepam but may be willing to take Xanax during the day.  Ok continue Xanax daytime for anxiety and allow her to eat..  Stay out of work right now.  Is on medical leave. Disc option of Day treatment bc severity of sx and she's afraid of being alone. Option call Behavioral Health Assessment at the main [facility] phone.  Tell them I referred you to the Day treatment program.  04/21/2020 urgent appointment, the following is noted: Generally sleeping better with quetiapine 75 mg HS.  Getting hangover.  Has to take Xanax to eat.  Losing weight 10#.  Just wants to lay down.  Feels shakey and weak. Did get restarted on clomipramine 25 mg HS.  Fatigue and hard to concentrate.   Plan: Continue medical leave until May 29, 2020.   The following med decisions were made: Continue clomipramine 25 mg 1 capsule for 10 days, then 2 each night or 50 mg nightly. Continue Klonopin 2 mg HS Increase Seroquel 75 mg for sleep and anxiety and rumination   05/03/2020 appointment urgently with the following noted:  Doesn't feel better yet.  Seroquel 75 gave her hangover.  With 50 mg usually 8 hours but some EMA esp if anxiety is bad.  Lays in bed until noon bc doesn't want to do anything  In bed to escape. When first started clomipramine 25 had sig insomnia.  At 50 mg now Appetite still poor and lost 10#.  Forcing food for 6 weeks. Idleness is not good for worry but can't focus well enough to read as much as she would usually otherwise bc anxiety interferes. Taking alprazolam 0.5 mg in pm to help eat dinner.  Getting from [place]. No med changes  05/18/20 appt with the following noted:  Another urgent appt DT severity of  anxiety. On clomipramine 50 mg since about 5/14. Appetite not better yet.  Lost total 12-13#.   Not depressed. Feels sleepy.  Always tired.  Wonders if there's medical cause. Sleeping a lot with Seroquel 50 mg HS. Notices less anxiety by "a little".   A little more obsessions since being at home but not severe. Not currently physically able to do the job and embarrassed about being out of work.  Disc concerns about her age and work and wondering about retirement from it fully.  No med changes.  06/07/2020 appointment with the following noted: Sleeping well with clonazepam 2 mg HS and quetiapine 50 mg HS. Still on clomipramine 50 since 04/21/20. Still exhausted from going to grocery store.  Not mentally exhausted but is physically.   Has remained on medical leave but not covered by FMLA bc not working FT. Decided to resign.  Doesn't feel able to do it yet.  Hopes to get to volunteer in the fall. Naps a good deal. Plan: Continue clomipramine 50 mg nightly (started about 5/14) Continue Klonopin 2 mg HS Continue Seroquel 25-50 mg for sleep and anxiety and rumination .  Try reducing as soon as she can to help energy. Ok continue Xanax daytime for anxiety and allow her to eat..  Meds won't work if you don't get enough protein.  08/10/20 appt with the following noted:  Nothing to do but gave job up bc didn't think she could do it physically. Did clean her house well.   Clomipramine has kicked in but hard time bc nothing to divert her attention.   No anxiety until married. After 5-6 years later got osbessions.  Never compulsions. Not depressed or despairing anymore. Thinking of 12 hour a week job and needs it to get out of the house.  Needs stimulation and activity. Plan: no med changes.  11/13/2020 appointment with the following noted: Stopped quetiapine a week ago.  Sleep is OK with a little more awakening but not severe.   Some chronic OCD but minimal some chronic anxiety but manageable.   Not depressed.  Sleep is adequate Son Donna Dillon Cancer doing well with promotion at [employer]. Surprisingly well. Still has compulsions including night eating.  Married since 2016.  Hasn't travelled lately but wants to.  [place]ys [place]. [place].  Has a dog. Plan no med changes:   03/15/2021 appointment with the following noted: A lot of changes in the last year.  Health decline in physical ability to walk.  No particular reason. Lost job and friends at the job.  Lost dog of 12 years.  B died in 12/26/2022, lived in [place].   Was going to volunteer but didn't DT covid. Dx advancing COPD despite not smoking.  This led to decision not to volunteer at  school. Clonazepam helps her and needs the 2 mg dose.  Afraid to reduce it.  Insurance won't cover. Needed to increase clonazepam to 2 mg HS 1 year ago and sleeping fine. No seroquel needed. Not depressed. Son eccentric but genius. Continue clomipramine 50 mg nightly (started about 04/2013) Continue Klonopin 2 mg HS for now but hope to gradually reduce it with goal of about 1 mg eventually.  Unlikely to be able to stop it. Disc way to reduce it in detail. No  med changes today.   09/13/2021 appointment with the following noted: Still can't find things to do or places to volunteer.  Need something to do.   Son Donna Dillon Cancer works for [employer] and works from home. OCD manageable but not 100% gone. Disc concerns about heart rate.  It's less than 100. Not exercising, but used to. EKG yearly but still worries her. Patient reports stable mood and denies depressed or irritable moods.  Patient denies any recent difficulty with anxiety.  Patient denies difficulty with sleep initiation or maintenance. Denies appetite disturbance.  Patient reports that energy and motivation have been good.  Patient denies any difficulty with concentration.  Patient denies any suicidal ideation. Plan no med changes  05/14/2022 appointment with the following noted: No med  changes. Chronic tiredness to some degree.  Sleep good.   Gets SOB with exertion.  More physical problems.  Dental problems, gingivitis.  Some leg swelling. Sleep well.  Not very active.  Pending card referral.  Not sleepy in the AM Son getting divorced but he's not good socially.  Pt making him dinner.   Past Psychiatric Medication Trials:  Clomipramine,   Poor response SSRIs  Trazodone 100 NR Seroquel 75 hangover Clonazepam 1-2 mg HS  Review of Systems:  Review of Systems  Constitutional:  Positive for fatigue. Negative for appetite change and unexpected weight change.       Lost 10-12 # in 3 weeks  HENT:  Positive for dental problem.   Respiratory:  Positive for shortness of breath.   Cardiovascular:  Positive for leg swelling.  Gastrointestinal:  Negative for abdominal pain.  Neurological:  Negative for dizziness, tremors, weakness and headaches.  Psychiatric/Behavioral:  Negative for agitation, behavioral problems, confusion, decreased concentration, dysphoric mood, hallucinations, self-injury, sleep disturbance and suicidal ideas. The patient is nervous/anxious. The patient is not hyperactive.    Medications: I have reviewed the patient's current medications.  Current Outpatient Medications  Medication Sig Dispense Refill   atenolol (TENORMIN) 25 MG tablet TAKE 1 TABLET DAILY FOR BLOOD PRESSURE. 90 tablet 3   Calcium 1500 MG tablet Take 1,500 mg by mouth daily.      clomiPRAMINE (ANAFRANIL) 50 MG capsule Take 1 capsule (50 mg total) by mouth at bedtime. 90 capsule 2   clonazePAM (KLONOPIN) 1 MG tablet Take 1.5 tablets (1.5 mg total) by mouth at bedtime. 45 tablet 1   ezetimibe (ZETIA) 10 MG tablet Take 1 tablet Daily for Cholesterol 90 tablet 3   fish oil-omega-3 fatty acids 1000 MG capsule Take 1 g by mouth daily.     magnesium 30 MG tablet Take 30 mg by mouth daily.      Multiple Vitamin (MULTIVITAMIN) tablet Take 1 tablet by mouth daily.     Multiple Vitamins-Minerals  (EYE VITAMINS PO) Take 1 capsule by mouth daily. [employer]cusight Vitamins     omeprazole (PRILOSEC) 40 MG capsule Take 1 capsule (40 mg total) by mouth daily. 30 capsule 11   vitamin C (ASCORBIC ACID) 500  MG tablet Take 500 mg by mouth daily.     VITAMIN D PO Take 5,000 Units by mouth. Takes 1 to 2 capsules daily.     amphetamine-dextroamphetamine (ADDERALL) 10 MG tablet Take 1 tablet 2 x /day  at   8 am   &   2 pm (Patient not taking: Reported on 05/14/2022) 60 tablet 0   No current facility-administered medications for this visit.    Medication Side Effects: None, dry  Allergies:  Allergies  Allergen Reactions   Prednisone     Altered mental state   Penicillins    Asa [Aspirin] Diarrhea   Levaquin [Levofloxacin] Other (See Comments)    Joint Pain   Macrolides And Ketolides Nausea And Vomiting   Paxil [Paroxetine Hcl] Nausea And Vomiting   Tetracyclines & Related Nausea And Vomiting   Zoloft [Sertraline Hcl] Nausea And Vomiting   Sulfa Antibiotics Rash   Trimethoprim Rash    Past Medical History:  Diagnosis Date   Anemia    mild   Anxiety    Colon polyp    hyperplastic   Diverticulosis    Esophageal stricture    GERD (gastroesophageal reflux disease)    no s/s now- off medicines   Hyperlipidemia    Palpitations    Prediabetes    Tachycardia     Family History  Problem Relation Age of Onset   Kidney cancer Father    Hypertension Mother    Heart disease Mother    Colon cancer Paternal Aunt    Colon polyps Neg Hx    Esophageal cancer Neg Hx    Rectal cancer Neg Hx    Stomach cancer Neg Hx     Social History   Socioeconomic History   Marital status: Married    Spouse name: Not on file   Number of children: 1   Years of education: Not on file   Highest education level: Not on file  Occupational History   Occupation: Librarian  Tobacco Use   Smoking status: Never   Smokeless tobacco: Never  Vaping Use   Vaping Use: Never used  Substance and Sexual Activity    Alcohol use: No    Alcohol/week: 0.0 standard drinks   Drug use: No   Sexual activity: Not on file  Other Topics Concern   Not on file  Social History Narrative   Not on file   Social Determinants of Health   Financial Resource Strain: Not on file  Food Insecurity: Not on file  Transportation Needs: Not on file  Physical Activity: Not on file  Stress: Not on file  Social Connections: Not on file  Intimate Partner Violence: Not on file    Past Medical History, Surgical history, Social history, and Family history were reviewed and updated as appropriate.   Son has severe anxiety and is treated also.  Please see review of systems for further details on the patient's review from today.   Objective:   Physical Exam:  LMP 12/09/1994   Physical Exam Constitutional:      General: She is not in acute distress.    Appearance: She is well-developed.  Musculoskeletal:        General: No deformity.  Neurological:     Mental Status: She is alert and oriented to person, place, and time.     Motor: No tremor.     Coordination: Coordination normal.     Gait: Gait normal.  Psychiatric:        Attention and  Perception: She is attentive. She does not perceive auditory hallucinations.        Mood and Affect: Mood is anxious. Mood is not depressed. Affect is not labile, blunt, angry, tearful or inappropriate.        Speech: Speech normal. Speech is not slurred.        Behavior: Behavior normal.        Thought Content: Thought content normal. Thought content is not delusional. Thought content does not include homicidal or suicidal ideation. Thought content does not include suicidal plan.        Cognition and Memory: Cognition normal.        Judgment: Judgment normal.     Comments: Insight intact. Residual obsessions if not busy.. No auditory or visual hallucinations. No delusions.  No rumination  Neat .  No sedation. Pleasant and talkative, fluent     Lab Review:     Component  Value Date/Time   NA 140 01/24/2022 1151   K 3.9 01/24/2022 1151   CL 105 01/24/2022 1151   CO2 27 01/24/2022 1151   GLUCOSE 85 01/24/2022 1151   BUN 9 01/24/2022 1151   CREATININE 0.80 01/24/2022 1151   CALCIUM 9.3 01/24/2022 1151   PROT 6.4 01/24/2022 1151   ALBUMIN 4.0 05/13/2017 1510   AST 9 (L) 01/24/2022 1151   ALT 12 01/24/2022 1151   ALKPHOS 61 05/13/2017 1510   BILITOT 0.4 01/24/2022 1151   GFRNONAA 77 02/15/2021 1546   GFRAA 89 02/15/2021 1546       Component Value Date/Time   WBC 6.3 01/24/2022 1151   RBC 4.75 01/24/2022 1151   HGB 13.9 01/24/2022 1151   HCT 42.6 01/24/2022 1151   PLT 234 01/24/2022 1151   MCV 89.7 01/24/2022 1151   MCH 29.3 01/24/2022 1151   MCHC 32.6 01/24/2022 1151   RDW 12.6 01/24/2022 1151   LYMPHSABS 2,268 01/24/2022 1151   MONOABS 0.8 12/18/2018 1628   EOSABS 252 01/24/2022 1151   BASOSABS 50 01/24/2022 1151    11/2022 EKG was normal.  .res Assessment: Plan:    Mixed obsessional thoughts and acts - Plan: clonazePAM (KLONOPIN) 1 MG tablet  Major depression, recurrent, full remission ([place])  Generalized anxiety disorder - Plan: clonazePAM (KLONOPIN) 1 MG tablet  Insomnia due to mental condition - Plan: clonazePAM (KLONOPIN) 1 MG tablet    Marked  worsening of anxiety and depresssion without obvious worsening of OCD after STOPPING CLOMIPRAMINE ACCIDENTALLY  in 2020.  But cannot sleep without clonazepam .   Disc risks TCA including cardiac and risk increasing resting heart rate. Disc pending cardiology appt and encourage her to go.  We discussed the short-term risks associated with benzodiazepines including sedation and increased fall risk among others.  Discussed long-term side effect risk including dependence, potential withdrawal symptoms, and the potential eventual dose-related risk of dementia.  But recent studies from 2020 dispute this association between benzodiazepines and dementia risk. Newer studies in 2020 do not support  an association with dementia. Disc her fears of addiction.  Wants to eventually consider tapering off of it using the quetiapine to protect her sleep.  Disc her fears of dose reduction but it is time before she has SE related to age and the BZ.  Extensive discussion.  Supportive therapy on relapse prevention and what to do to deal with her job and anxiety. Keep herself occupied in retirement.  Disc this again and she's looking for volunteer position.  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  Enc exercise.  Start back.  She doesn't feel capable yet but did before. E  Continue clomipramine 50 mg nightly (started about 04/2013) Rec reduce Klonopin to 1.5 mg HS bc of age increase risk of SE. Unlikely to be able to stop it.  This appt was 30 mins.  FU 6 mos   Lynder Parents, MD, DFAPA   Please see After Visit Summary for patient specific instructions.  Future Appointments  Date Time Provider [place]  08/20/2022  3:00 PM Unk Pinto, MD [id number] None    No orders of the defined types were placed in this encounter.     -------------------------------

## 2022-06-18 ENCOUNTER — Emergency Department (HOSPITAL_BASED_OUTPATIENT_CLINIC_OR_DEPARTMENT_OTHER): Payer: Medicare PPO | Admitting: Radiology

## 2022-06-18 ENCOUNTER — Emergency Department (HOSPITAL_BASED_OUTPATIENT_CLINIC_OR_DEPARTMENT_OTHER): Payer: Medicare PPO

## 2022-06-18 ENCOUNTER — Encounter (HOSPITAL_BASED_OUTPATIENT_CLINIC_OR_DEPARTMENT_OTHER): Payer: Self-pay

## 2022-06-18 ENCOUNTER — Emergency Department (HOSPITAL_BASED_OUTPATIENT_CLINIC_OR_DEPARTMENT_OTHER)
Admission: EM | Admit: 2022-06-18 | Discharge: 2022-06-18 | Disposition: A | Discharge status: Home / Self Care | Payer: Medicare PPO | Attending: Emergency Medicine | Admitting: Emergency Medicine

## 2022-06-18 ENCOUNTER — Telehealth: Payer: Self-pay

## 2022-06-18 DIAGNOSIS — G8929 Other chronic pain: Secondary | ICD-10-CM

## 2022-06-18 DIAGNOSIS — R0602 Shortness of breath: Secondary | ICD-10-CM | POA: Diagnosis not present

## 2022-06-18 DIAGNOSIS — R911 Solitary pulmonary nodule: Secondary | ICD-10-CM

## 2022-06-18 DIAGNOSIS — R5383 Other fatigue: Secondary | ICD-10-CM | POA: Insufficient documentation

## 2022-06-18 DIAGNOSIS — R002 Palpitations: Secondary | ICD-10-CM | POA: Insufficient documentation

## 2022-06-18 DIAGNOSIS — R0789 Other chest pain: Secondary | ICD-10-CM | POA: Diagnosis not present

## 2022-06-18 DIAGNOSIS — R61 Generalized hyperhidrosis: Secondary | ICD-10-CM | POA: Diagnosis not present

## 2022-06-18 DIAGNOSIS — R42 Dizziness and giddiness: Secondary | ICD-10-CM | POA: Diagnosis not present

## 2022-06-18 DIAGNOSIS — I10 Essential (primary) hypertension: Secondary | ICD-10-CM | POA: Insufficient documentation

## 2022-06-18 DIAGNOSIS — Z20822 Contact with and (suspected) exposure to covid-19: Secondary | ICD-10-CM | POA: Insufficient documentation

## 2022-06-18 DIAGNOSIS — R000 Tachycardia, unspecified: Secondary | ICD-10-CM | POA: Diagnosis not present

## 2022-06-18 DIAGNOSIS — Z79899 Other long term (current) drug therapy: Secondary | ICD-10-CM | POA: Diagnosis not present

## 2022-06-18 DIAGNOSIS — R079 Chest pain, unspecified: Secondary | ICD-10-CM | POA: Diagnosis not present

## 2022-06-18 DIAGNOSIS — R531 Weakness: Secondary | ICD-10-CM | POA: Diagnosis not present

## 2022-06-18 LAB — TROPONIN I (HIGH SENSITIVITY): Troponin I (High Sensitivity): <2 ng/L (ref <18)

## 2022-06-18 LAB — CBC
HCT: 42.4 % (ref 36.0–46.0)
Hemoglobin: 14.2 g/dL (ref 12.0–15.0)
MCH: 29.3 pg (ref 26.0–34.0)
MCHC: 33.5 g/dL (ref 30.0–36.0)
MCV: 87.6 fL (ref 80.0–100.0)
Platelets: 247 10*3/uL (ref 150–400)
RBC: 4.84 MIL/uL (ref 3.87–5.11)
RDW: 12.5 % (ref 11.5–15.5)
WBC: 7 10*3/uL (ref 4.0–10.5)
nRBC: 0 % (ref 0.0–0.2)

## 2022-06-18 LAB — TSH: TSH: 2.215 u[IU]/mL (ref 0.350–4.500)

## 2022-06-18 LAB — BASIC METABOLIC PANEL
Anion gap: 12 (ref 5–15)
BUN: 10 mg/dL (ref 8–23)
CO2: 24 mmol/L (ref 22–32)
Calcium: 10.1 mg/dL (ref 8.9–10.3)
Chloride: 103 mmol/L (ref 98–111)
Creatinine, Ser: 0.72 mg/dL (ref 0.44–1.00)
GFR, Estimated: >60 mL/min (ref >60)
Glucose, Bld: 79 mg/dL (ref 70–99)
Potassium: 3.9 mmol/L (ref 3.5–5.1)
Sodium: 139 mmol/L (ref 135–145)

## 2022-06-18 LAB — T4, FREE: Free T4: 0.99 ng/dL (ref 0.61–1.12)

## 2022-06-18 LAB — RESP PANEL BY RT-PCR (FLU A&B, COVID) ARPGX2
Influenza A by PCR: NEGATIVE
Influenza B by PCR: NEGATIVE
SARS Coronavirus 2 by RT PCR: NEGATIVE

## 2022-06-18 MED ORDER — IOHEXOL 350 MG/ML SOLN
100.0000 mL | Freq: Once | INTRAVENOUS | Status: AC | PRN
  Administered 2022-06-18: 75 mL via INTRAVENOUS

## 2022-06-18 NOTE — ED Provider Notes (Signed)
[facility] EMERGENCY DEPT Provider Note   CSN: [id number] Arrival date & time: 06/18/22  1515     History  No chief complaint on file.   Donna Dillon is a 76 y.o. female with history of essential hypertension, hyperlipidemia, GERD, esophageal stricture that presents with 2.5 years of intermittent palpitations and shortness of breath.  The patient states that the most recent episode occurred this morning.  She states that she develops pain in the middle of her back prior to these episodes and that her symptoms worsen with standing up and exertion.  Patient also states that her shortness of breath worsens when laying down at night. Patient is also complaining of intermittent fatigue, diaphoresis, and lightheadedness upon standing over the last 2.5 years.  She states that she has noticed bilateral lower extremity swelling for the last 2 months.   She states that she has seen her primary care doctor for her symptoms and had a negative chest x-ray.  She denies following up with cardiology for her symptoms.  Patient denies headache, chest pain, fever, chills, abdominal pain, nausea, vomiting, diarrhea.  Patient denies recent trauma or injuries.  The history is provided by the patient.       Home Medications Prior to Admission medications   Medication Sig Start Date End Date Taking? Authorizing Provider  amphetamine-dextroamphetamine (ADDERALL) 10 MG tablet Take 1 tablet 2 x /day  at   8 am   &   2 pm Patient not taking: Reported on 05/14/2022 01/25/22   Unk Pinto, MD  atenolol (TENORMIN) 25 MG tablet TAKE 1 TABLET DAILY FOR BLOOD PRESSURE. 03/05/22   Unk Pinto, MD  Calcium 1500 MG tablet Take 1,500 mg by mouth daily.     [provider]  clomiPRAMINE (ANAFRANIL) 50 MG capsule Take 1 capsule (50 mg total) by mouth at bedtime. 09/13/21   Cottle, Billey Co., MD  clonazePAM (KLONOPIN) 1 MG tablet Take 1.5 tablets (1.5 mg total) by mouth at bedtime. 05/14/22    Cottle, Billey Co., MD  ezetimibe (ZETIA) 10 MG tablet Take 1 tablet Daily for Cholesterol 02/16/21   Liane Comber, NP  fish oil-omega-3 fatty acids 1000 MG capsule Take 1 g by mouth daily.    [provider]  magnesium 30 MG tablet Take 30 mg by mouth daily.     [provider]  Multiple Vitamin (MULTIVITAMIN) tablet Take 1 tablet by mouth daily.    [provider]  Multiple Vitamins-Minerals (EYE VITAMINS PO) Take 1 capsule by mouth daily. Ocusight Vitamins    [provider]  omeprazole (PRILOSEC) 40 MG capsule Take 1 capsule (40 mg total) by mouth daily. 12/23/18   Irene Shipper, MD  vitamin C (ASCORBIC ACID) 500 MG tablet Take 500 mg by mouth daily.    [provider]  VITAMIN D PO Take 5,000 Units by mouth. Takes 1 to 2 capsules daily.    [provider]      Allergies    Prednisone, Penicillins, Asa [aspirin], Levaquin [levofloxacin], Macrolides and ketolides, Paxil [paroxetine hcl], Tetracyclines & related, Zoloft [sertraline hcl], Sulfa antibiotics, and Trimethoprim    Review of Systems   Review of Systems  Constitutional:  Positive for diaphoresis and fatigue. Negative for chills and fever.  Respiratory:  Positive for shortness of breath.   Cardiovascular:  Positive for palpitations and leg swelling. Negative for chest pain.  Gastrointestinal:  Negative for abdominal pain, diarrhea, nausea and vomiting.  Musculoskeletal:  Positive for  back pain.  Neurological:  Positive for light-headedness. Negative for headaches.    Physical Exam Updated Vital Signs BP 123/73   Pulse 72   Temp 99.1 F (37.3 C)   Resp 18   Ht 5\' 6"  (1.676 m)   Wt 67.1 kg   LMP 12/09/1994   SpO2 99%   BMI 23.89 kg/m  Physical Exam Constitutional:      General: She is not in acute distress. Cardiovascular:     Rate and Rhythm: Normal rate and regular rhythm.     Pulses: Normal pulses.     Heart sounds: No murmur heard.    No friction rub. No  gallop.  Pulmonary:     Effort: Pulmonary effort is normal.     Breath sounds: No wheezing, rhonchi or rales.  Musculoskeletal:        General: No deformity.     Comments: No midline spinal tenderness. No tenderness to palpation of the paraspinal regions bilaterally.   Neurological:     Mental Status: She is alert.     ED Results / Procedures / Treatments   Labs (all labs ordered are listed, but only abnormal results are displayed) Labs Reviewed  RESP PANEL BY RT-PCR (FLU A&B, COVID) [id number]  BASIC METABOLIC PANEL  CBC  TROPONIN I (HIGH SENSITIVITY)    EKG EKG Interpretation  Date/Time:  Tuesday June 18 2022 15:20:06 EDT Ventricular Rate:  90 PR Interval:  146 QRS Duration: 76 QT Interval:  348 QTC Calculation: 425 R Axis:   32 Text Interpretation: Normal sinus rhythm Nonspecific ST and T wave abnormality Abnormal ECG When compared with ECG of 18-Dec-2018 16:38, PREVIOUS ECG IS PRESENT Confirmed by Regan Lemming ([id number]) on 06/18/2022 4:04:30 PM  Radiology DG Chest 2 View  Result Date: 06/18/2022 CLINICAL DATA:  Provided history: Shortness of breath. Additional history provided: Rapid heart rate, generalized weakness, intermittent chest pain. EXAM: CHEST - 2 VIEW COMPARISON:  Chest radiograph 01/24/2022 and earlier. FINDINGS: Heart size within normal limits. Aortic atherosclerosis. Mild ill-defined opacity within the left lung base appreciated on the PA radiograph only and favored to reflect atelectasis. No appreciable airspace consolidation within the right lung. No evidence of pleural effusion or pneumothorax. Small to moderate-sized hiatal hernia. No acute bony abnormality identified. IMPRESSION: Mild ill-defined opacity within the left lung base, appreciated on the PA radiograph only and favored to reflect atelectasis. Aortic Atherosclerosis (ICD10-I70.0). Small to moderate-sized hiatal hernia. Electronically Signed   By: Kellie Simmering D.O.   On: 06/18/2022 15:49     Procedures Procedures    Medications Ordered in ED Medications - No data to display  ED Course/ Medical Decision Making/ A&P                           Medical Decision Making Donna Dillon is a 76 y.o. female with history of essential hypertension, hyperlipidemia, GERD, esophageal stricture that presents with 2.5 years of intermittent episodes of back pain, palpitations, and shortness of breath.  Differential diagnosis includes but is not limited to pulmonary embolism, pneumonia, hyperthyroidism.  Will order CBC and BMP to assess cell counts, electrolytes, renal function.  Will order chest x-ray, EKG, and troponin to rule out acute cardiopulmonary disease.  Will order respiratory viral panel.  Will order CTA to rule out potential pulmonary embolism.  Will order TSH and free T4 to assess for potential hyperthyroidism.  Patient discussed with Dr. Kellie Simmering.  Amount and/or Complexity of Data Reviewed  Labs: ordered. Decision-making details documented in ED Course. Radiology: ordered and independent interpretation performed. Decision-making details documented in ED Course.  Risk Prescription drug management.  5:09 PM Patient CBC and BMP are unremarkable.  Patient's troponin is negative.  Patient's viral respiratory panel was negative.  Patient's chest x-ray demonstrates a mild ill-defined opacity within the left lung base favored to reflect atelectasis.  An incidental moderate-sized hiatal hernia was noted on the chest x-ray. EKG is normal. Awaiting results of CTA, TSH, and free T4.   6:52 PM  Patient's TSH is normal.  Patient CTA is negative for pulmonary embolism or acute cardiopulmonary process but does note a 9 mm pulmonary nodule in the right upper lobe.  Radiology recommends noncontrast CT at 6-12 months to confirm persistence then additional noncontrast chest CT every 2 years until 5 years.  Radiology states that if the nodule grows or develops solid components consider resection.  Given the patients normal TSH I am less concerned for hyperthyroidism. Given the patients normal chest x-ray, EKG, and troponin I am less concerned for acute cardiopulmonary disease or pneumonia. I updated the patient on the results of her lab work and imaging. Discussed with patient plan to follow-up with pulmonology for her lung nodule. Discussed with patient plan to follow up with her primary care doctor for her symptoms and to see if she should be referred to cardiology. Discussed plan to return to the ED should she develop new or worsening symptoms including but not limited to chest pain, shortness of breath, syncope.  Patient agrees with the plan.         Final Clinical Impression(s) / ED Diagnoses Final diagnoses:  None    Rx / DC Orders ED Discharge Orders     None         Geron Mulford, Claudia Desanctis, MD 06/18/22 2147    Regan Lemming, MD 06/18/22 2346

## 2022-06-18 NOTE — ED Triage Notes (Signed)
Pt c/o sob, rapid heart rate, generalized weakness, and intermittent cp and back pain for about a year. States symptoms became more severe over the past couple of days.

## 2022-06-18 NOTE — Discharge Instructions (Addendum)
IMPRESSION:  1. No evidence for pulmonary embolism.  2. No acute cardiopulmonary process.  3. 9 mm right ground-glass pulmonary nodule within the upper lobe.  Recommend a non-contrast Chest CT at 6-12 months to confirm  persistence, then additional non-contrast Chest CTs every 2 years  until 5 years. If nodule grows or develops solid component(s),  consider resection.  These guidelines do not apply to immunocompromised patients and  patients with cancer. Follow up in patients with significant  comorbidities as clinically warranted. For lung cancer screening,  adhere to Lung-RADS guidelines. Reference: Radiology. 2017;  284(1):228-43.  4.  Aortic Atherosclerosis (ICD10-I70.0).

## 2022-06-18 NOTE — Telephone Encounter (Signed)
The patient called clinic to request an appointment. She reported shortness of breath, severe fatigue and weakness, pain in between her shoulder blade and palpitations. I asked her to take her blood pressure and heart rate with readings of 138/89 and heart rate of 96. I spoke with the patient's husband and advised him that the patient needed to be evaluated at their local emergency room and suggested [facility]. The patient's husband verified that he knew the facility and would take the patient there as soon as possible.

## 2022-06-19 ENCOUNTER — Telehealth: Payer: Self-pay | Admitting: Psychiatry

## 2022-06-19 NOTE — Telephone Encounter (Signed)
RTC  When standing increase HR 115-120  and gets dizzy.  Can tell pulse races when stands.   Went to ER yesterday but they did not do orthostatics.   Reduced clonazepam 1.5 mg HS Drink plenty fluids but maybe not as much as normal.   No diarrhea.  Using prune juice. Thinks she having BZ withdrawal.   Anxious about reducing clonazepam.   Needs check orthostatics and call us back.  PCP may need to be involved with this.  Lynder Parents, MD, DFAPA

## 2022-06-19 NOTE — Telephone Encounter (Signed)
Pt called and said that the weaning down from the klonopin is not working. She said she is shaking all over and can't walk. Please call her at [phone]

## 2022-06-21 ENCOUNTER — Other Ambulatory Visit: Payer: Self-pay | Admitting: Psychiatry

## 2022-06-21 MED ORDER — BUSPIRONE HCL 5 MG PO TABS: 5.0000 mg | ORAL_TABLET | Freq: Two times a day (BID) | ORAL | 0 refills | Status: DC

## 2022-06-21 MED ORDER — QUETIAPINE FUMARATE 25 MG PO TABS: 25.0000 mg | ORAL_TABLET | Freq: Every day | ORAL | 0 refills | Status: DC

## 2022-06-21 NOTE — Telephone Encounter (Signed)
Called patient and gave her recommendations and let her know that Rx had been sent. She asked about the BP and I told her that you would defer for now.

## 2022-06-21 NOTE — Telephone Encounter (Signed)
In response to the last phone call she left a message in writing with the office stating the following: Thursday, July 16 2 PM, sitting blood pressure 104/65, pulse 75 Standing position: BP: 102/68, pulse rate 84 She reported these readings are much lower than yesterday's with regard to the heart rate changes.  She asks about taking Seroquel to help sleep and lower blood pressure and heart rate.  She took this in the past.  She is also asking for a "not an addictive tranquilizer" that could help with anxiety during the day and specifically asked about buspirone. She thinks her anxiety is worse after reducing clonazepam from 2 mg HS to 1.5 mg HS about 6 weeks ago.  Regarding orthostatic changes, her blood pressure did not change but her heart rate did go up somewhat upon standing which is suggestive of orthostasis.  She is taking atenolol.  She should continue that for now although switching to propranolol may have a better antianxiety effect but we will defer that decision for now. If she is having trouble sleeping she can take quetiapine or Seroquel 1 at night for now.  That could help some with anxiety in the daytime as well. Otherwise I would agree with a trial of buspirone for its antianxiety effect.  It is very mild and not sedating and not addicting or habit-forming.  She is very med sensitive so I will send in the lowest dosage.  If that does not help after a week or so we could increase the dosage Lynder Parents, MD, DFAPA

## 2022-07-16 NOTE — Progress Notes (Unsigned)
Synopsis: Referred for pulmonary nodule by Regan Lemming, MD  Subjective:   PATIENT ID: Donna Dillon GENDER: female DOB: Sep 02, 1946, MRN: [id number]  No chief complaint on file.  76yF with history of GERD, esophageal stricture, emphysema referred for pulmonary nodule  Otherwise pertinent review of systems is negative.  Past Medical History:  Diagnosis Date   Anemia    mild   Anxiety    Colon polyp    hyperplastic   Diverticulosis    Esophageal stricture    GERD (gastroesophageal reflux disease)    no s/s now- off medicines   Hyperlipidemia    Palpitations    Prediabetes    Tachycardia      Family History  Problem Relation Age of Onset   Kidney cancer Father    Hypertension Mother    Heart disease Mother    Colon cancer Paternal Aunt    Colon polyps Neg Hx    Esophageal cancer Neg Hx    Rectal cancer Neg Hx    Stomach cancer Neg Hx      Past Surgical History:  Procedure Laterality Date   BREAST BIOPSY Left    CESAREAN SECTION     x1   COLONOSCOPY  2017   eye lift     POLYPECTOMY     TONSILLECTOMY     UPPER GASTROINTESTINAL ENDOSCOPY      Social History   Socioeconomic History   Marital status: Married    Spouse name: Not on file   Number of children: 1   Years of education: Not on file   Highest education level: Not on file  Occupational History   Occupation: Librarian  Tobacco Use   Smoking status: Never   Smokeless tobacco: Never  Vaping Use   Vaping Use: Never used  Substance and Sexual Activity   Alcohol use: No    Alcohol/week: 0.0 standard drinks of alcohol   Drug use: No   Sexual activity: Not on file  Other Topics Concern   Not on file  Social History Narrative   Not on file   Social Determinants of Health   Financial Resource Strain: Not on file  Food Insecurity: Not on file  Transportation Needs: Not on file  Physical Activity: Not on file  Stress: Not on file  Social Connections: Not on file  Intimate Partner  Violence: Not on file     Allergies  Allergen Reactions   Prednisone     Altered mental state   Penicillins    Asa [Aspirin] Diarrhea   Levaquin [Levofloxacin] Other (See Comments)    Joint Pain   Macrolides And Ketolides Nausea And Vomiting   Paxil [Paroxetine Hcl] Nausea And Vomiting   Tetracyclines & Related Nausea And Vomiting   Zoloft [Sertraline Hcl] Nausea And Vomiting   Sulfa Antibiotics Rash   Trimethoprim Rash     Outpatient Medications Prior to Visit  Medication Sig Dispense Refill   amphetamine-dextroamphetamine (ADDERALL) 10 MG tablet Take 1 tablet 2 x /day  at   8 am   &   2 pm (Patient not taking: Reported on 05/14/2022) 60 tablet 0   atenolol (TENORMIN) 25 MG tablet TAKE 1 TABLET DAILY FOR BLOOD PRESSURE. 90 tablet 3   busPIRone (BUSPAR) 5 MG tablet Take 1 tablet (5 mg total) by mouth 2 (two) times daily. 60 tablet 0   Calcium 1500 MG tablet Take 1,500 mg by mouth daily.      clomiPRAMINE (ANAFRANIL) 50 MG capsule Take 1  capsule (50 mg total) by mouth at bedtime. 90 capsule 2   clonazePAM (KLONOPIN) 1 MG tablet Take 1.5 tablets (1.5 mg total) by mouth at bedtime. 45 tablet 1   ezetimibe (ZETIA) 10 MG tablet Take 1 tablet Daily for Cholesterol 90 tablet 3   fish oil-omega-3 fatty acids 1000 MG capsule Take 1 g by mouth daily.     magnesium 30 MG tablet Take 30 mg by mouth daily.      Multiple Vitamin (MULTIVITAMIN) tablet Take 1 tablet by mouth daily.     Multiple Vitamins-Minerals (EYE VITAMINS PO) Take 1 capsule by mouth daily. Ocusight Vitamins     omeprazole (PRILOSEC) 40 MG capsule Take 1 capsule (40 mg total) by mouth daily. 30 capsule 11   QUEtiapine (SEROQUEL) 25 MG tablet Take 1 tablet (25 mg total) by mouth at bedtime. 30 tablet 0   vitamin C (ASCORBIC ACID) 500 MG tablet Take 500 mg by mouth daily.     VITAMIN D PO Take 5,000 Units by mouth. Takes 1 to 2 capsules daily.     No facility-administered medications prior to visit.       Objective:    Physical Exam:  General appearance: 76 y.o., female, NAD, conversant, female, NAD, conversant  Eyes: anicteric sclerae; PERRL, tracking appropriately HENT: NCAT; MMM Neck: Trachea midline; no lymphadenopathy, no JVD Lungs: CTAB, no crackles, no wheeze, with normal respiratory effort CV: RRR, no murmur  Abdomen: Soft, non-tender; non-distended, BS present  Extremities: No peripheral edema, warm Skin: Normal turgor and texture; no rash Psych: Appropriate affect Neuro: Alert and oriented to person and place, no focal deficit     There were no vitals filed for this visit.   on *** LPM *** RA BMI Readings from Last 3 Encounters:  06/18/22 23.89 kg/m  01/24/22 24.79 kg/m  08/21/21 24.79 kg/m   Wt Readings from Last 3 Encounters:  06/18/22 148 lb (67.1 kg)  01/24/22 153 lb 9.6 oz (69.7 kg)  08/21/21 153 lb 9.6 oz (69.7 kg)     CBC    Component Value Date/Time   WBC 7.0 06/18/2022 1525   RBC 4.84 06/18/2022 1525   HGB 14.2 06/18/2022 1525   HCT 42.4 06/18/2022 1525   PLT 247 06/18/2022 1525   MCV 87.6 06/18/2022 1525   MCH 29.3 06/18/2022 1525   MCHC 33.5 06/18/2022 1525   RDW 12.5 06/18/2022 1525   LYMPHSABS 2,268 01/24/2022 1151   MONOABS 0.8 12/18/2018 1628   EOSABS 252 01/24/2022 1151   BASOSABS 50 01/24/2022 1151    ***  Chest Imaging: TA Chest 06/18/22 reviewed by me with 27mm ggo, no concerning adenopathy and no clear emphysema  Pulmonary Functions Testing Results:     No data to display             Assessment & Plan:    Plan:      Maryjane Hurter, MD [facility] 07/16/2022 12:29 PM

## 2022-07-17 ENCOUNTER — Encounter: Payer: Self-pay | Admitting: Student

## 2022-07-17 ENCOUNTER — Ambulatory Visit (INDEPENDENT_AMBULATORY_CARE_PROVIDER_SITE_OTHER): Payer: Medicare PPO | Admitting: Student

## 2022-07-17 VITALS — BP 104/70 | HR 66 | Temp 98.4°F | Ht 66.0 in | Wt 153.4 lb

## 2022-07-17 DIAGNOSIS — R911 Solitary pulmonary nodule: Secondary | ICD-10-CM

## 2022-07-17 NOTE — Patient Instructions (Addendum)
-   Risk for cancer over next 2-4 years appears to be 10-17% based on our calculators. Tentatively we have planned on follow up CT Chest in 3 months.  - If you go home and you're miserable thinking about it and you'd like for [place] to instead pursue robotic bronchoscopy then call our clinic [phone] or send my chart message - see you in 3 months or sooner if need be!

## 2022-08-19 ENCOUNTER — Encounter: Payer: Self-pay | Admitting: Internal Medicine

## 2022-08-19 NOTE — Patient Instructions (Signed)

## 2022-08-19 NOTE — Progress Notes (Unsigned)
Annual Screening/Preventative Visit & Comprehensive Evaluation &  Examination  Future Appointments  Date Time Provider Department  08/20/2022  3:00 PM Unk Pinto, MD [facility]  11/13/2022  4:00 PM Cottle, Billey Co., MD [facility]  08/25/2023  3:00 PM Unk Pinto, MD [facility]          This very nice 76 y.o. MWF presents for a Screening /Preventative Visit & comprehensive evaluation and management of multiple medical co-morbidities.  Patient has been followed for HTN, HLD, Prediabetes  and Vitamin D Deficiency. Patient is followed by Psychiatrist Dr Clovis Pu ~ 15+  years for long hx/o major Depression, OCD with mixed obsessional thoughts & acts, Generalized Anxiety Disorder and Insomnia.  Patient has GERD controlled on her meds.           In Aug Chest CTA found a 9 mm RUL nodule & patient was referred to Dr Leslye Peer who has ordered a f/u Chest CT scan for Nov 8.         HTN predates circa  1990's. Patient's BP has been controlled at home and patient denies any cardiac symptoms as chest pain, palpitations, shortness of breath, dizziness or ankle swelling. Today's BP is                             .         Patient's hyperlipidemia is not controlled with diet and Ezetimibe. Patient denies myalgias or other medication SE's. Last lipids were near goal :  Lab Results  Component Value Date   CHOL 195 08/20/2021   HDL 65 08/20/2021   LDLCALC 102 (H) 08/20/2021   TRIG 161 (H) 08/20/2021   CHOLHDL 3.0 08/20/2021         Patient has hx/o prediabetes (A1c 5.8% /2011) and patient denies reactive hypoglycemic symptoms, visual blurring, diabetic polys or paresthesias. Last A1c was at goal :  Lab Results  Component Value Date   HGBA1C 5.3 08/20/2021         Finally, patient has history of Vitamin D Deficiency ("36" /2008) and last Vitamin D was at goal :  Lab Results  Component Value Date   VD25OH 73 08/20/2021       Current Outpatient Medications:    atenolol  (TENORMIN) 25 MG tablet, TAKE 1 TABLET DAILY FOR BLOOD PRESSURE., Disp: 90 tablet, Rfl: 3   Calcium 1500 MG tablet, Take 1,500 mg by mouth daily. , Disp: , Rfl:    clomiPRAMINE (ANAFRANIL) 50 MG capsule, Take 1 capsule (50 mg total) by mouth at bedtime., Disp: 90 capsule, Rfl: 2   clonazePAM (KLONOPIN) 1 MG tablet, Take 1.5 tablets (1.5 mg total) by mouth at bedtime., Disp: 45 tablet, Rfl: 1   fish oil-omega-3 fatty acids 1000 MG capsule, Take 1 g by mouth daily., Disp: , Rfl:    magnesium 30 MG tablet, Take 30 mg by mouth daily. , Disp: , Rfl:    Multiple Vitamin (MULTIVITAMIN) tablet, Take 1 tablet by mouth daily., Disp: , Rfl:    Multiple Vitamins-Minerals (EYE VITAMINS PO), Take 1 capsule by mouth daily. Ocusight Vitamins, Disp: , Rfl:    QUEtiapine (SEROQUEL) 25 MG tablet, Take 1 tablet (25 mg total) by mouth at bedtime., Disp: 30 tablet, Rfl: 0   vitamin C (ASCORBIC ACID) 500 MG tablet, Take 500 mg by mouth daily., Disp: , Rfl:    VITAMIN D PO, Take 5,000 Units by mouth. Takes 1 to 2  capsules daily., Disp: , Rfl:    ezetimibe (ZETIA) 10 MG tablet, Take 1 tablet Daily for Cholesterol (Patient not taking: Reported on 07/17/2022), Disp: 90 tablet, Rfl: 3    Allergies  Allergen Reactions   Prednisone     Altered mental state   Penicillins    Asa [Aspirin] Diarrhea   Levaquin [Levofloxacin] Other (See Comments)    Joint Pain   Macrolides And Ketolides Nausea And Vomiting   Paxil [Paroxetine Hcl] Nausea And Vomiting   Tetracyclines & Related Nausea And Vomiting   Zoloft [Sertraline Hcl] Nausea And Vomiting   Sulfa Antibiotics Rash   Trimethoprim Rash     Past Medical History:  Diagnosis Date   Anemia    mild   Anxiety    Colon polyp    hyperplastic   Diverticulosis    Esophageal stricture    GERD (gastroesophageal reflux disease)    no s/s now- off medicines   Hyperlipidemia    Palpitations    Prediabetes    Tachycardia      Health Maintenance  Topic Date Due    Zoster Vaccines- Shingrix (1 of 2) Never done   COVID-19 Vaccine (3 - Pfizer risk series) 04/17/2020   MAMMOGRAM  07/08/2020   COLONOSCOPY  03/05/2021   INFLUENZA VACCINE  07/09/2021   TETANUS/TDAP  12/04/2027   DEXA SCAN  Completed   Hepatitis C Screening  Completed   PNA vac Low Risk Adult  Completed   HPV VACCINES  Aged Out     Immunization History  Administered Date(s) Administered   Influenza, High Dose  10/10/2016, 12/03/2017, 11/03/2018   PFIZER - SARS-COV-2 Vacc 02/24/2020, 03/20/2020   Pneumococcal -13 09/21/2015   Pneumococcal -23 02/14/2009, 05/13/2017   Td 12/11/2006, 12/03/2017   Tdap 12/09/2005   Zoster, Live 02/27/2011    Last Colon - 03/05/2016 - Dr Henrene Pastor - Recc 5 yr f/u - due Apr 2022 - patient aware overdue   Last MGM - 07/09/2019 - Patient aware overdue & plans to schedule   Past Surgical History:  Procedure Laterality Date   BREAST BIOPSY Left    CESAREAN SECTION     x1   COLONOSCOPY  2017   eye lift     POLYPECTOMY     TONSILLECTOMY     UPPER GASTROINTESTINAL ENDOSCOPY       Family History  Problem Relation Age of Onset   Kidney cancer Father    Hypertension Mother    Heart disease Mother    Colon cancer Paternal Aunt    Colon polyps Neg Hx    Esophageal cancer Neg Hx    Rectal cancer Neg Hx    Stomach cancer Neg Hx      Social History   Tobacco Use   Smoking status: Never   Smokeless tobacco: Never  Vaping Use   Vaping Use: Never used  Substance Use Topics   Alcohol use: No    Alcohol/week: 0.0 standard drinks   Drug use: No      ROS Constitutional: Denies fever, chills, weight loss/gain, headaches, insomnia,  night sweats, and change in appetite. Does c/o fatigue. Eyes: Denies redness, blurred vision, diplopia, discharge, itchy, watery eyes.  ENT: Denies discharge, congestion, post nasal drip, epistaxis, sore throat, earache, hearing loss, dental pain, Tinnitus, Vertigo, Sinus pain, snoring.  Cardio: Denies chest pain,  palpitations, irregular heartbeat, syncope, dyspnea, diaphoresis, orthopnea, PND, claudication, edema Respiratory: denies cough, dyspnea, DOE, pleurisy, hoarseness, laryngitis, wheezing.  Gastrointestinal: Denies dysphagia, heartburn, reflux, water  brash, pain, cramps, nausea, vomiting, bloating, diarrhea, constipation, hematemesis, melena, hematochezia, jaundice, hemorrhoids Genitourinary: Denies dysuria, frequency, urgency, nocturia, hesitancy, discharge, hematuria, flank pain Breast: Breast lumps, nipple discharge, bleeding.  Musculoskeletal: Denies arthralgia, myalgia, stiffness, Jt. Swelling, pain, limp, and strain/sprain. Denies falls. Skin: Denies puritis, rash, hives, warts, acne, eczema, changing in skin lesion Neuro: No weakness, tremor, incoordination, spasms, paresthesia, pain Psychiatric: Denies confusion, memory loss, sensory loss. Denies Depression. Endocrine: Denies change in weight, skin, hair change, nocturia, and paresthesia, diabetic polys, visual blurring, hyper / hypo glycemic episodes.  Heme/Lymph: No excessive bleeding, bruising, enlarged lymph nodes.  Physical Exam  BP 106/68   Pulse 68   Temp 97.8 F (36.6 C)   Resp 16   Ht 5\' 6"  (1.676 m)   Wt 153 lb 3.2 oz (69.5 kg)   LMP 12/09/1994   SpO2 98%   BMI 24.73 kg/m   General Appearance: Well nourished, well groomed and in no apparent distress.  Eyes: PERRLA, EOMs, conjunctiva no swelling or erythema, normal fundi and vessels. Sinuses: No frontal/maxillary tenderness ENT/Mouth: EACs patent / TMs  nl. Nares clear without erythema, swelling, mucoid exudates. Oral hygiene is good. No erythema, swelling, or exudate. Tongue normal, non-obstructing. Tonsils not swollen or erythematous. Hearing normal.  Neck: Supple, thyroid not palpable. No bruits, nodes or JVD. Respiratory: Respiratory effort normal.  BS equal and clear bilateral without rales, rhonci, wheezing or stridor. Cardio: Heart sounds are normal with regular  rate and rhythm and no murmurs, rubs or gallops. Peripheral pulses are normal and equal bilaterally without edema. No aortic or femoral bruits. Chest: symmetric with normal excursions and percussion. Breasts: Symmetric, without lumps, nipple discharge, retractions, or fibrocystic changes.  Abdomen: Flat, soft with bowel sounds active. Nontender, no guarding, rebound, hernias, masses, or organomegaly.  Lymphatics: Non tender without lymphadenopathy.  Musculoskeletal: Full ROM all peripheral extremities, joint stability, 5/5 strength, and normal gait. Skin: Warm and dry without rashes, lesions, cyanosis, clubbing or  ecchymosis.  Neuro: Cranial nerves intact, reflexes equal bilaterally. Normal muscle tone, no cerebellar symptoms. Sensation intact.  Pysch: Alert and oriented X 3, normal affect, Insight and Judgment appropriate.    Assessment and Plan  1. Annual Preventative Screening Examination   2. Labile hypertension  - Urinalysis, Routine w reflex microscopic - Microalbumin / creatinine urine ratio - CBC with Differential/Platelet - COMPLETE METABOLIC PANEL WITH GFR - Magnesium - TSH  3. Hyperlipidemia, mixed  - EKG 12-Lead - Lipid panel - TSH  4. Abnormal glucose  - EKG 12-Lead - Hemoglobin A1c - Insulin, random  5. Vitamin D deficiency  - VITAMIN D 25 Hydroxy   6. Major depressive disorder in remission(HCC)  - TSH  7. Screening for colorectal cancer  - POC Hemoccult Bld/Stl   8. Screening for heart disease  - EKG 12-Lead  9. FHx: heart disease  - EKG 12-Lead  10. Medication management - Urinalysis, Routine w reflex microscopic - Microalbumin / creatinine urine ratio - CBC with Differential/Platelet - COMPLETE METABOLIC PANEL WITH GFR - Magnesium - Lipid panel - TSH - Hemoglobin A1c - Insulin, random - VITAMIN D 25 Hydroxy           Patient was counseled in prudent diet to achieve/maintain BMI less than 25 for weight control, BP monitoring,  regular exercise and medications. Discussed med's effects and SE's. Screening labs and tests as requested with regular follow-up as recommended. Over 40 minutes of exam, counseling, chart review and high complex critical decision making was performed.  Kirtland Bouchard, MD

## 2022-08-20 ENCOUNTER — Ambulatory Visit (INDEPENDENT_AMBULATORY_CARE_PROVIDER_SITE_OTHER): Payer: Medicare PPO | Admitting: Internal Medicine

## 2022-08-20 ENCOUNTER — Encounter: Payer: Self-pay | Admitting: Internal Medicine

## 2022-08-20 VITALS — BP 106/68 | HR 68 | Temp 97.8°F | Resp 16 | Ht 66.0 in | Wt 153.2 lb

## 2022-08-20 DIAGNOSIS — R7309 Other abnormal glucose: Secondary | ICD-10-CM | POA: Diagnosis not present

## 2022-08-20 DIAGNOSIS — R0989 Other specified symptoms and signs involving the circulatory and respiratory systems: Secondary | ICD-10-CM | POA: Diagnosis not present

## 2022-08-20 DIAGNOSIS — Z8249 Family history of ischemic heart disease and other diseases of the circulatory system: Secondary | ICD-10-CM

## 2022-08-20 DIAGNOSIS — Z0000 Encounter for general adult medical examination without abnormal findings: Secondary | ICD-10-CM

## 2022-08-20 DIAGNOSIS — Z79899 Other long term (current) drug therapy: Secondary | ICD-10-CM

## 2022-08-20 DIAGNOSIS — E559 Vitamin D deficiency, unspecified: Secondary | ICD-10-CM | POA: Diagnosis not present

## 2022-08-20 DIAGNOSIS — E782 Mixed hyperlipidemia: Secondary | ICD-10-CM

## 2022-08-20 DIAGNOSIS — F325 Major depressive disorder, single episode, in full remission: Secondary | ICD-10-CM | POA: Diagnosis not present

## 2022-08-20 DIAGNOSIS — Z1211 Encounter for screening for malignant neoplasm of colon: Secondary | ICD-10-CM

## 2022-08-20 DIAGNOSIS — I10 Essential (primary) hypertension: Secondary | ICD-10-CM

## 2022-08-20 DIAGNOSIS — Z136 Encounter for screening for cardiovascular disorders: Secondary | ICD-10-CM | POA: Diagnosis not present

## 2022-08-20 DIAGNOSIS — Z0001 Encounter for general adult medical examination with abnormal findings: Secondary | ICD-10-CM

## 2022-08-21 ENCOUNTER — Other Ambulatory Visit: Payer: Self-pay | Admitting: Internal Medicine

## 2022-08-21 LAB — COMPLETE METABOLIC PANEL WITH GFR
AG Ratio: 1.9 (calc) (ref 1.0–2.5)
ALT: 8 U/L (ref 6–29)
AST: 7 U/L — ABNORMAL LOW (ref 10–35)
Albumin: 4.7 g/dL (ref 3.6–5.1)
Alkaline phosphatase (APISO): 69 U/L (ref 37–153)
BUN: 10 mg/dL (ref 7–25)
CO2: 27 mmol/L (ref 20–32)
Calcium: 9.9 mg/dL (ref 8.6–10.4)
Chloride: 101 mmol/L (ref 98–110)
Creat: 0.84 mg/dL (ref 0.60–1.00)
Globulin: 2.5 g/dL (calc) (ref 1.9–3.7)
Glucose, Bld: 85 mg/dL (ref 65–99)
Potassium: 4.2 mmol/L (ref 3.5–5.3)
Sodium: 136 mmol/L (ref 135–146)
Total Bilirubin: 0.5 mg/dL (ref 0.2–1.2)
Total Protein: 7.2 g/dL (ref 6.1–8.1)
eGFR: 72 mL/min/{1.73_m2} (ref >=60)

## 2022-08-21 LAB — LIPID PANEL
Cholesterol: 256 mg/dL — ABNORMAL HIGH (ref <200)
HDL: 65 mg/dL (ref >=50)
LDL Cholesterol (Calc): 161 mg/dL (calc) — ABNORMAL HIGH
Non-HDL Cholesterol (Calc): 191 mg/dL (calc) — ABNORMAL HIGH (ref <130)
Total CHOL/HDL Ratio: 3.9 (calc) (ref <5.0)
Triglycerides: 157 mg/dL — ABNORMAL HIGH (ref <150)

## 2022-08-21 LAB — CBC WITH DIFFERENTIAL/PLATELET
Absolute Monocytes: 522 cells/uL (ref 200–950)
Basophils Absolute: 41 cells/uL (ref 0–200)
Basophils Relative: 0.7 %
Eosinophils Absolute: 110 cells/uL (ref 15–500)
Eosinophils Relative: 1.9 %
HCT: 42.7 % (ref 35.0–45.0)
Hemoglobin: 14 g/dL (ref 11.7–15.5)
Lymphs Abs: 2268 cells/uL (ref 850–3900)
MCH: 29.4 pg (ref 27.0–33.0)
MCHC: 32.8 g/dL (ref 32.0–36.0)
MCV: 89.5 fL (ref 80.0–100.0)
MPV: 9.9 fL (ref 7.5–12.5)
Monocytes Relative: 9 %
Neutro Abs: 2859 cells/uL (ref 1500–7800)
Neutrophils Relative %: 49.3 %
Platelets: 245 10*3/uL (ref 140–400)
RBC: 4.77 10*6/uL (ref 3.80–5.10)
RDW: 12.5 % (ref 11.0–15.0)
Total Lymphocyte: 39.1 %
WBC: 5.8 10*3/uL (ref 3.8–10.8)

## 2022-08-21 LAB — URINALYSIS, ROUTINE W REFLEX MICROSCOPIC
Bacteria, UA: NONE SEEN /HPF
Bilirubin Urine: NEGATIVE
Glucose, UA: NEGATIVE
Hgb urine dipstick: NEGATIVE
Hyaline Cast: NONE SEEN /LPF
Ketones, ur: NEGATIVE
Nitrite: NEGATIVE
Protein, ur: NEGATIVE
Specific Gravity, Urine: 1.008 (ref 1.001–1.035)
Squamous Epithelial / HPF: NONE SEEN /HPF (ref <=5)
WBC, UA: NONE SEEN /HPF (ref 0–5)
pH: 7 (ref 5.0–8.0)

## 2022-08-21 LAB — HEMOGLOBIN A1C
Hgb A1c MFr Bld: 5.4 % of total Hgb (ref <5.7)
Mean Plasma Glucose: 108 mg/dL
eAG (mmol/L): 6 mmol/L

## 2022-08-21 LAB — VITAMIN D 25 HYDROXY (VIT D DEFICIENCY, FRACTURES): Vit D, 25-Hydroxy: 67 ng/mL (ref 30–100)

## 2022-08-21 LAB — MAGNESIUM: Magnesium: 2.3 mg/dL (ref 1.5–2.5)

## 2022-08-21 LAB — MICROALBUMIN / CREATININE URINE RATIO
Creatinine, Urine: 47 mg/dL (ref 20–275)
Microalb, Ur: <0.2 mg/dL

## 2022-08-21 LAB — INSULIN, RANDOM: Insulin: 5.1 u[IU]/mL

## 2022-08-21 LAB — TSH: TSH: 1.63 mIU/L (ref 0.40–4.50)

## 2022-08-21 LAB — MICROSCOPIC MESSAGE

## 2022-08-21 MED ORDER — ROSUVASTATIN CALCIUM 20 MG PO TABS: ORAL_TABLET | ORAL | 3 refills | Status: DC

## 2022-08-21 NOTE — Progress Notes (Signed)
<><><><><><><><><><><><><><><><><><><><><><><><><><><><><><><><><> <><><><><><><><><><><><><><><><><><><><><><><><><><><><><><><><><> -   Test results slightly outside the reference range are not unusual. If there is anything important, I will review this with you,  otherwise it is considered normal test values.  If you have further questions,  please do not hesitate to contact me at the office or via My Chart.  <><><><><><><><><><><><><><><><><><><><><><><><><><><><><><><><><> <><><><><><><><><><><><><><><><><><><><><><><><><><><><><><><><><>  -  Cholesterol much worse & high risk since stopping Zetia.                      Sent in new Rx to start Rosuvastatin &                                                                  strongly recommend also re-start your Zetia   - to Prevent Heart Attacks, Strokes & Vascular Dementia !  <><><><><><><><><><><><><><><><><><><><><><><><><><><><><><><><><> <><><><><><><><><><><><><><><><><><><><><><><><><><><><><><><><><>  -  A1c - Normal - No Diabetes - Great !  <><><><><><><><><><><><><><><><><><><><><><><><><><><><><><><><><> <><><><><><><><><><><><><><><><><><><><><><><><><><><><><><><><><>  -  Vitamin D = 67 - Great - Please continue dose same  <><><><><><><><><><><><><><><><><><><><><><><><><><><><><><><><><> <><><><><><><><><><><><><><><><><><><><><><><><><><><><><><><><><>  -  All Else - CBC - Kidneys - Electrolytes - Liver - Magnesium & Thyroid    - all  Normal / OK <><><><><><><><><><><><><><><><><><><><><><><><><><><><><><><><><> <><><><><><><><><><><><><><><><><><><><><><><><><><><><><><><><><>

## 2022-08-29 ENCOUNTER — Other Ambulatory Visit: Payer: Self-pay | Admitting: Psychiatry

## 2022-08-29 DIAGNOSIS — F422 Mixed obsessional thoughts and acts: Secondary | ICD-10-CM

## 2022-08-29 DIAGNOSIS — F411 Generalized anxiety disorder: Secondary | ICD-10-CM

## 2022-09-15 ENCOUNTER — Other Ambulatory Visit: Payer: Self-pay | Admitting: Psychiatry

## 2022-09-15 DIAGNOSIS — F5105 Insomnia due to other mental disorder: Secondary | ICD-10-CM

## 2022-09-16 ENCOUNTER — Other Ambulatory Visit: Payer: Self-pay

## 2022-09-24 ENCOUNTER — Other Ambulatory Visit: Payer: Self-pay | Admitting: Psychiatry

## 2022-09-24 ENCOUNTER — Telehealth: Payer: Self-pay | Admitting: Psychiatry

## 2022-09-24 DIAGNOSIS — F5105 Insomnia due to other mental disorder: Secondary | ICD-10-CM

## 2022-09-24 DIAGNOSIS — F411 Generalized anxiety disorder: Secondary | ICD-10-CM

## 2022-09-24 DIAGNOSIS — F422 Mixed obsessional thoughts and acts: Secondary | ICD-10-CM

## 2022-09-24 MED ORDER — CLONAZEPAM 1 MG PO TABS: 1.5000 mg | ORAL_TABLET | Freq: Every day | ORAL | 1 refills | Status: DC

## 2022-09-24 NOTE — Telephone Encounter (Signed)
Call [facility] and cancel any remaining clonazepam refills.  I sent in a refill for clonazepam 1 mg tablet 1-1/2 nightly which is the proper dose to [facility].

## 2022-09-24 NOTE — Telephone Encounter (Signed)
Pt wants [place] to send to [facility].However the notes show she should be taking 1 mg 1.5 tabs but 2 mg has been dispensed by [facility] despite the rx that was sent on 05/14/22.Which dose should she be taking?

## 2022-09-24 NOTE — Telephone Encounter (Signed)
Pt called at 3:10p.  She cannot get her Klonopin from [facility].  Pls send it there and advise her when done.  You may leave voice mail.  Next appt 12/6

## 2022-09-25 NOTE — Telephone Encounter (Signed)
[facility] stated no rx on file

## 2022-10-16 ENCOUNTER — Ambulatory Visit
Admission: RE | Admit: 2022-10-16 | Discharge: 2022-10-16 | Disposition: A | Discharge status: Home / Self Care | Payer: Medicare PPO | Source: Ambulatory Visit | Attending: Student | Admitting: Student

## 2022-10-16 DIAGNOSIS — R918 Other nonspecific abnormal finding of lung field: Secondary | ICD-10-CM | POA: Diagnosis not present

## 2022-10-16 DIAGNOSIS — R911 Solitary pulmonary nodule: Secondary | ICD-10-CM

## 2022-10-16 DIAGNOSIS — I700 Atherosclerosis of aorta: Secondary | ICD-10-CM | POA: Diagnosis not present

## 2022-10-23 NOTE — Progress Notes (Unsigned)
Synopsis: Referred for pulmonary nodule by Unk Pinto, MD  Subjective:   PATIENT ID: Donna Dillon GENDER: female DOB: March 22, 1946, MRN: [id number]  No chief complaint on file.  76yF with history of GERD, esophageal stricture, emphysema referred for pulmonary nodule  She had CTA Chest done for dyspnea and rapid heart rate at [facility]. Revealed 91mm ggo RUL. She has no cough now. Did have productive cough last year but has cleared up. No fever, chills. Has had pneumonia 3-4 times last was 2 years ago.   She has no family history of lung cancer but there were a lot of other cancers in her family  She is a never smoker but did have secondhand smoke exposure growing up. She worked as a Licensed conveyancer. Even worked for 10 years after that in [employer]. No MJ, vaping.    Interval HPI  Stable RUL 74mm ggo  Otherwise pertinent review of systems is negative.  Past Medical History:  Diagnosis Date   Anemia    mild   Anxiety    Colon polyp    hyperplastic   Diverticulosis    Esophageal stricture    GERD (gastroesophageal reflux disease)    no s/s now- off medicines   Hyperlipidemia    Palpitations    Prediabetes    Tachycardia      Family History  Problem Relation Age of Onset   Hypertension Mother    Heart disease Mother    Kidney cancer Father    Emphysema Father        smoked   Colon cancer Paternal Aunt    Colon polyps Neg Hx    Esophageal cancer Neg Hx    Rectal cancer Neg Hx    Stomach cancer Neg Hx      Past Surgical History:  Procedure Laterality Date   BREAST BIOPSY Left    CESAREAN SECTION     x1   COLONOSCOPY  2017   eye lift     POLYPECTOMY     TONSILLECTOMY     UPPER GASTROINTESTINAL ENDOSCOPY      Social History   Socioeconomic History   Marital status: Married    Spouse name: Not on file   Number of children: 1   Years of education: Not on file   Highest education level: Not on file  Occupational History   Occupation: Librarian   Tobacco Use   Smoking status: Never    Passive exposure: Past   Smokeless tobacco: Never  Vaping Use   Vaping Use: Never used  Substance and Sexual Activity   Alcohol use: No    Alcohol/week: 0.0 standard drinks of alcohol   Drug use: No   Sexual activity: Not on file  Other Topics Concern   Not on file  Social History Narrative   Not on file   Social Determinants of Health   Financial Resource Strain: Not on file  Food Insecurity: Not on file  Transportation Needs: Not on file  Physical Activity: Not on file  Stress: Not on file  Social Connections: Not on file  Intimate Partner Violence: Not on file     Allergies  Allergen Reactions   Prednisone     Altered mental state   Penicillins    Asa [Aspirin] Diarrhea   Levaquin [Levofloxacin] Other (See Comments)    Joint Pain   Macrolides And Ketolides Nausea And Vomiting   Paxil [Paroxetine Hcl] Nausea And Vomiting   Tetracyclines & Related Nausea And Vomiting  Zoloft [Sertraline Hcl] Nausea And Vomiting   Sulfa Antibiotics Rash   Trimethoprim Rash     Outpatient Medications Prior to Visit  Medication Sig Dispense Refill   atenolol (TENORMIN) 25 MG tablet TAKE 1 TABLET DAILY FOR BLOOD PRESSURE. 90 tablet 3   Calcium 1500 MG tablet Take 1,500 mg by mouth daily.      clomiPRAMINE (ANAFRANIL) 50 MG capsule TAKE 1 CAPSULE AT BEDTIME 90 capsule 2   clonazePAM (KLONOPIN) 1 MG tablet Take 1.5 tablets (1.5 mg total) by mouth at bedtime. 45 tablet 1   ezetimibe (ZETIA) 10 MG tablet Take 1 tablet Daily for Cholesterol (Patient not taking: Reported on 07/17/2022) 90 tablet 3   fish oil-omega-3 fatty acids 1000 MG capsule Take 1 g by mouth daily.     magnesium 30 MG tablet Take 30 mg by mouth daily.      Multiple Vitamin (MULTIVITAMIN) tablet Take 1 tablet by mouth daily.     Multiple Vitamins-Minerals (EYE VITAMINS PO) Take 1 capsule by mouth daily. Ocusight Vitamins     QUEtiapine (SEROQUEL) 25 MG tablet Take 1 tablet (25 mg  total) by mouth at bedtime. 30 tablet 0   rosuvastatin (CRESTOR) 20 MG tablet Take 1 tablet Daily for Cholesterol 90 tablet 3   vitamin C (ASCORBIC ACID) 500 MG tablet Take 500 mg by mouth daily.     VITAMIN D PO Take 5,000 Units by mouth. Takes 1 to 2 capsules daily.     No facility-administered medications prior to visit.       Objective:   Physical Exam:  General appearance: 76 y.o., female, NAD, conversant  Eyes: anicteric sclerae; PERRL, tracking appropriately HENT: NCAT; MMM Neck: Trachea midline; no lymphadenopathy, no JVD Lungs: CTAB, no crackles, no wheeze, with normal respiratory effort CV: RRR, no murmur  Abdomen: Soft, non-tender; non-distended, BS present  Extremities: No peripheral edema, warm Skin: Normal turgor and texture; no rash Psych: Appropriate affect Neuro: Alert and oriented to person and place, no focal deficit     There were no vitals filed for this visit.    on RA BMI Readings from Last 3 Encounters:  08/20/22 24.73 kg/m  07/17/22 24.76 kg/m  06/18/22 23.89 kg/m   Wt Readings from Last 3 Encounters:  08/20/22 153 lb 3.2 oz (69.5 kg)  07/17/22 153 lb 6.4 oz (69.6 kg)  06/18/22 148 lb (67.1 kg)     CBC    Component Value Date/Time   WBC 5.8 08/20/2022 1514   RBC 4.77 08/20/2022 1514   HGB 14.0 08/20/2022 1514   HCT 42.7 08/20/2022 1514   PLT 245 08/20/2022 1514   MCV 89.5 08/20/2022 1514   MCH 29.4 08/20/2022 1514   MCHC 32.8 08/20/2022 1514   RDW 12.5 08/20/2022 1514   LYMPHSABS 2,268 08/20/2022 1514   MONOABS 0.8 12/18/2018 1628   EOSABS 110 08/20/2022 1514   BASOSABS 41 08/20/2022 1514      Chest Imaging: TA Chest 06/18/22 reviewed by me with 90mm ggo, no concerning adenopathy and no clear emphysema  CT Super D 10/18/22 reviewed by me stable RUL 110mm ggo. No suspicious adenopathy  Pulmonary Functions Testing Results:     No data to display             Assessment & Plan:   # RUL 9mm GGO: Calculators with range  of 10-17% chance malignancy over next 2-4 years. Discussed options of robotic bronch under general with risks of PTX 1% at our institution, very low  risk of respiratory failure and bleeding vs watchful waiting with repeat CT Chest at least by 6 months. She is anxious waiting 6 months despite knowing that likelihood of significant interval growth for this ggo even if cancerous is low.   Plan: - Tentatively we have planned on follow up CT Chest in 3 months.  - If you go home and you're miserable thinking about it and you'd like for [place] to instead pursue robotic bronchoscopy then call our clinic [phone] or send my chart message - see you in 3 months or sooner if need be!     Maryjane Hurter, MD [facility] 10/23/2022 12:57 PM

## 2022-10-24 ENCOUNTER — Ambulatory Visit: Payer: Medicare PPO | Admitting: Student

## 2022-10-24 ENCOUNTER — Encounter: Payer: Self-pay | Admitting: Student

## 2022-10-24 VITALS — BP 106/64 | HR 74 | Ht 66.0 in | Wt 153.0 lb

## 2022-10-24 DIAGNOSIS — R911 Solitary pulmonary nodule: Secondary | ICD-10-CM

## 2022-10-24 NOTE — Patient Instructions (Signed)
-   Referral placed to cardiothoracic surgery for consideration of combination navigation bronchoscopy and resection - Order placed for breathing tests

## 2022-10-29 ENCOUNTER — Telehealth: Payer: Self-pay | Admitting: Student

## 2022-10-29 NOTE — Telephone Encounter (Signed)
Called patient and she is wanting to make sure that her breathing test that is scheduled for Jan 3rd is ok.   Or do you want it to be sooner sir?  Please advise

## 2022-10-29 NOTE — Telephone Encounter (Signed)
Patient called to inform the doctor that her breathing test is scheduled for 01/3 and she wanted to make sure that wasn't too late for her to have the test considering the possible diagnosis.  She would like the nurse to call her to confirm that waiting that far out would be ok.  CB# [phone]

## 2022-10-30 ENCOUNTER — Ambulatory Visit (INDEPENDENT_AMBULATORY_CARE_PROVIDER_SITE_OTHER): Payer: Medicare PPO | Admitting: Pulmonary Disease

## 2022-10-30 DIAGNOSIS — R911 Solitary pulmonary nodule: Secondary | ICD-10-CM

## 2022-10-30 LAB — PULMONARY FUNCTION TEST
DL/VA % pred: 107 %
DL/VA: 4.36 ml/min/mmHg/L
DLCO cor % pred: 111 %
DLCO cor: 22.86 ml/min/mmHg
DLCO unc % pred: 111 %
DLCO unc: 22.86 ml/min/mmHg
FEF 25-75 Post: 1.14 L/sec
FEF 25-75 Pre: 0.8 L/sec
FEF2575-%Change-Post: 41 %
FEF2575-%Pred-Post: 64 %
FEF2575-%Pred-Pre: 45 %
FEV1-%Change-Post: 9 %
FEV1-%Pred-Post: 79 %
FEV1-%Pred-Pre: 73 %
FEV1-Post: 1.84 L
FEV1-Pre: 1.68 L
FEV1FVC-%Change-Post: 7 %
FEV1FVC-%Pred-Pre: 80 %
FEV6-%Change-Post: 4 %
FEV6-%Pred-Post: 96 %
FEV6-%Pred-Pre: 92 %
FEV6-Post: 2.81 L
FEV6-Pre: 2.68 L
FEV6FVC-%Change-Post: 3 %
FEV6FVC-%Pred-Post: 104 %
FEV6FVC-%Pred-Pre: 101 %
FVC-%Change-Post: 1 %
FVC-%Pred-Post: 92 %
FVC-%Pred-Pre: 91 %
FVC-Post: 2.83 L
FVC-Pre: 2.79 L
Post FEV1/FVC ratio: 65 %
Post FEV6/FVC ratio: 99 %
Pre FEV1/FVC ratio: 60 %
Pre FEV6/FVC Ratio: 96 %
RV % pred: 154 %
RV: 3.69 L
TLC % pred: 113 %
TLC: 6.09 L

## 2022-10-30 NOTE — Progress Notes (Signed)
Full PFT Performed Today  

## 2022-10-30 NOTE — Telephone Encounter (Signed)
Maryjane Hurter, MD  [facility] Triage Pool14 hours ago (5:49 PM)    Preferable to be done sooner if at all possible. TCTS would need it before deciding if candidate for wedge resection or lobectomy. Do you think I should order it to be done at [facility] to expedite?    Called and spoke with pt's spouse about the PFT. Stated to him that we could get pt scheduled sooner at [facility] location for PFT and could even do it today 11/22 at 11:30. He stated that was perfect so pt has been rescheduled for PFT today 11/22 at 11:30.   Routing to Dr. Verlee Monte as an Juluis Rainier.

## 2022-10-30 NOTE — Patient Instructions (Signed)
Full PFT Performed Today  

## 2022-11-13 ENCOUNTER — Encounter: Payer: Self-pay | Admitting: Psychiatry

## 2022-11-13 ENCOUNTER — Ambulatory Visit (INDEPENDENT_AMBULATORY_CARE_PROVIDER_SITE_OTHER): Payer: Medicare PPO | Admitting: Psychiatry

## 2022-11-13 DIAGNOSIS — F422 Mixed obsessional thoughts and acts: Secondary | ICD-10-CM

## 2022-11-13 DIAGNOSIS — F411 Generalized anxiety disorder: Secondary | ICD-10-CM | POA: Diagnosis not present

## 2022-11-13 DIAGNOSIS — F3342 Major depressive disorder, recurrent, in full remission: Secondary | ICD-10-CM

## 2022-11-13 DIAGNOSIS — F5105 Insomnia due to other mental disorder: Secondary | ICD-10-CM

## 2022-11-13 MED ORDER — CLOMIPRAMINE HCL 50 MG PO CAPS: 50.0000 mg | ORAL_CAPSULE | Freq: Every day | ORAL | 2 refills | Status: DC

## 2022-11-13 MED ORDER — QUETIAPINE FUMARATE 25 MG PO TABS: 25.0000 mg | ORAL_TABLET | Freq: Every day | ORAL | 1 refills | Status: DC

## 2022-11-13 MED ORDER — CLONAZEPAM 1 MG PO TABS: 1.5000 mg | ORAL_TABLET | Freq: Every day | ORAL | 5 refills | Status: DC

## 2022-11-13 NOTE — Progress Notes (Signed)
DENNYS TRAUGHBER [id number] March 14, 1946 76 y.o.  Subjective:   Patient ID:  ZHAVIA CUNANAN is a 76 y.o. (DOB Dec 09, 1946) female.  Chief Complaint:  Chief Complaint  Patient presents with   Follow-up   Anxiety   Depression    Depression        Associated symptoms include fatigue.  Associated symptoms include no decreased concentration, no appetite change, no headaches and no suicidal ideas.  Past medical history includes anxiety.   Anxiety Symptoms include nervous/anxious behavior and shortness of breath. Patient reports no confusion, decreased concentration, dizziness or suicidal ideas.     ALEZANDRA EGLI presents to the office today for follow-up of OCD.    seen December 2020.  No meds were changed.  seen Apr 10, 2020.  The following is noted: She is markedly worse. "falling apart".  More fatigued since dilation of esophagus.  January more dry eyes.  Hair falling out.  TSH is normal.   More sleep problems since Feb. Called and increased clonazepam to 2 mg HS bc also the insomnia was causing more anxiety.  Now also scared of taking the clonazepam.   Added trazodone 100 mg and it didn't help sleep. More obsessed with heart rate being elevated to 100 bpm.   Can't work DT anxiety and insomnia and fear over her heart. Scared.  She was ruminating. Plan:Increase atenolol for anxiety and tachycardia to 25 mg daily. DC trazodone Seroquel 25-50 mg for sleep Needs increased BZ .  She is afraid increasing the clonazepam but may be willing to take Xanax during the day.  Stay out of work this week. Disc option of Day treatment bc severity of sx and she's afraid of being alone. Option call Behavioral Health Assessment at the main [facility] phone.  Tell them I referred you to the Day treatment program. Return to office in 4 days.  04/14/2020 appointment the following is noted: Took 37.5 mg Seroquel Monday with 2 mg Klonopin and awoke in 3 hour. Afterwards usually took Seroquel 50 mg since then  with Klonopin.  Lost another 1# and total of 11#/3 weeks.  Eating after taking alprazolam.  Can't quilt or read or focus on TV.  Heart rate is better after increase atenolol to 25 mg daily. Now 5-6 hours of sleep. [name]. Plan: Start clomipramine 25 mg 1 capsule for 10 days, then 2 each night or 50 mg nightly. Continue Klonopin 2 mg HS Increase Seroquel 75 mg for sleep and anxiety and rumination Needs increased BZ .  She is afraid increasing the clonazepam but may be willing to take Xanax during the day.  Ok continue Xanax daytime for anxiety and allow her to eat..  Stay out of work right now.  Is on medical leave. Disc option of Day treatment bc severity of sx and she's afraid of being alone. Option call Behavioral Health Assessment at the main [facility] phone.  Tell them I referred you to the Day treatment program.  04/21/2020 urgent appointment, the following is noted: Generally sleeping better with quetiapine 75 mg HS.  Getting hangover.  Has to take Xanax to eat.  Losing weight 10#.  Just wants to lay down.  Feels shakey and weak. Did get restarted on clomipramine 25 mg HS.  Fatigue and hard to concentrate.   Plan: Continue medical leave until May 29, 2020.   The following med decisions were made: Continue clomipramine 25 mg 1 capsule for 10 days, then 2  each night or 50 mg nightly. Continue Klonopin 2 mg HS Increase Seroquel 75 mg for sleep and anxiety and rumination   05/03/2020 appointment urgently with the following noted:  Doesn't feel better yet.  Seroquel 75 gave her hangover.  With 50 mg usually 8 hours but some EMA esp if anxiety is bad.  Lays in bed until noon bc doesn't want to do anything  In bed to escape. When first started clomipramine 25 had sig insomnia.  At 50 mg now Appetite still poor and lost 10#.  Forcing food for 6 weeks. Idleness is not good for worry but can't focus well enough to read as much as she would  usually otherwise bc anxiety interferes. Taking alprazolam 0.5 mg in pm to help eat dinner.  Getting from [place]. No med changes  05/18/20 appt with the following noted:  Another urgent appt DT severity of anxiety. On clomipramine 50 mg since about 5/14. Appetite not better yet.  Lost total 12-13#.   Not depressed. Feels sleepy.  Always tired.  Wonders if there's medical cause. Sleeping a lot with Seroquel 50 mg HS. Notices less anxiety by "a little".   A little more obsessions since being at home but not severe. Not currently physically able to do the job and embarrassed about being out of work.  Disc concerns about her age and work and wondering about retirement from it fully.  No med changes.  06/07/2020 appointment with the following noted: Sleeping well with clonazepam 2 mg HS and quetiapine 50 mg HS. Still on clomipramine 50 since 04/21/20. Still exhausted from going to grocery store.  Not mentally exhausted but is physically.   Has remained on medical leave but not covered by FMLA bc not working FT. Decided to resign.  Doesn't feel able to do it yet.  Hopes to get to volunteer in the fall. Naps a good deal. Plan: Continue clomipramine 50 mg nightly (started about 5/14) Continue Klonopin 2 mg HS Continue Seroquel 25-50 mg for sleep and anxiety and rumination .  Try reducing as soon as she can to help energy. Ok continue Xanax daytime for anxiety and allow her to eat..  Meds won't work if you don't get enough protein.  08/10/20 appt with the following noted:  Nothing to do but gave job up bc didn't think she could do it physically. Did clean her house well.   Clomipramine has kicked in but hard time bc nothing to divert her attention.   No anxiety until married. After 5-6 years later got osbessions.  Never compulsions. Not depressed or despairing anymore. Thinking of 12 hour a week job and needs it to get out of the house.  Needs stimulation and activity. Plan: no med  changes.  11/13/2020 appointment with the following noted: Stopped quetiapine a week ago.  Sleep is OK with a little more awakening but not severe.   Some chronic OCD but minimal some chronic anxiety but manageable.  Not depressed.  Sleep is adequate Son [name] Cancer doing well with promotion at [employer] [employer] 20 people. Surprisingly well. Still has compulsions including night eating.  Married since 2016.  Hasn't travelled lately but wants to.  Enjoys [place]. [place].  Has a dog. Plan no med changes:   03/15/2021 appointment with the following noted: A lot of changes in the last year.  Health decline in physical ability to walk.  No particular reason. Lost job and friends at the job.  Lost dog of 12 years.  B  died in January, lived in [place].   Was going to volunteer but didn't DT covid. Dx advancing COPD despite not smoking.  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 Clonazepam helps her and needs the 2 mg dose.  Afraid to reduce it.  Insurance won't cover. Needed to increase clonazepam to 2 mg HS 1 year ago and sleeping fine. No seroquel needed. Not depressed. Son eccentric but genius. Continue clomipramine 50 mg nightly (started about 04/2013) Continue Klonopin 2 mg HS for now but hope to gradually reduce it with goal of about 1 mg eventually.  Unlikely to be able to stop it. Disc way to reduce it in detail. No  med changes today.   09/13/2021 appointment with the following noted: Still can't find things to do or places to volunteer.  Need something to do.   Son [name] Cancer works for [employer] and works from home. OCD manageable but not 100% gone. Disc concerns about heart rate.  It's less than 100. Not exercising, but used to. EKG yearly but still worries her. Patient reports stable mood and denies depressed or irritable moods.  Patient denies any recent difficulty with anxiety.  Patient denies difficulty with sleep initiation or maintenance. Denies appetite disturbance.  Patient  reports that energy and motivation have been good.  Patient denies any difficulty with concentration.  Patient denies any suicidal ideation. Plan no med changes  05/14/2022 appointment with the following noted: No med changes. Chronic tiredness to some degree.  Sleep good.   Gets SOB with exertion.  More physical problems.  Dental problems, gingivitis.  Some leg swelling. Sleep well.  Not very active.  Pending card referral.  Not sleepy in the AM Son getting divorced but he's not good socially.  Pt making him dinner.  11/13/22 appt noted: Some med issues with clonazepam RF.  Was still getting 2 mg size. Taking clonazepam 1.5 mg HS and clomipramine 50 mg HS and a little quetiapine.   Quetiapine helps sleep at 12.5 mg HS. Has had some health problems and including pulm nodule and needs Bx.  Pending and likely lobectomy.  Disc stress of it and doesn't feel she can reduce the clonazepam. Feels SOB and tired.  Coughs in the AM when gets up. Not sig depressed.    Past Psychiatric Medication Trials:  Clomipramine,   Poor response SSRIs  Trazodone 100 NR Seroquel 75 hangover Clonazepam 1-2 mg HS  Review of Systems:  Review of Systems  Constitutional:  Positive for fatigue. Negative for appetite change and unexpected weight change.       Lost 10-12 # in 3 weeks  HENT:  Positive for dental problem.   Respiratory:  Positive for shortness of breath.   Cardiovascular:  Positive for leg swelling.  Gastrointestinal:  Negative for abdominal pain.  Neurological:  Negative for dizziness, tremors and headaches.  Psychiatric/Behavioral:  Negative for agitation, behavioral problems, confusion, decreased concentration, dysphoric mood, hallucinations, self-injury, sleep disturbance and suicidal ideas. The patient is nervous/anxious. The patient is not hyperactive.     Medications: I have reviewed the patient's current medications.  Current Outpatient Medications  Medication Sig Dispense Refill    atenolol (TENORMIN) 25 MG tablet TAKE 1 TABLET DAILY FOR BLOOD PRESSURE. 90 tablet 3   Calcium 1500 MG tablet Take 1,500 mg by mouth daily.      ezetimibe (ZETIA) 10 MG tablet Take 1 tablet Daily for Cholesterol 90 tablet 3   fish oil-omega-3 fatty acids 1000 MG capsule Take 1 g  by mouth daily.     magnesium 30 MG tablet Take 30 mg by mouth daily.      Multiple Vitamin (MULTIVITAMIN) tablet Take 1 tablet by mouth daily.     Multiple Vitamins-Minerals (EYE VITAMINS PO) Take 1 capsule by mouth daily. Ocusight Vitamins     rosuvastatin (CRESTOR) 20 MG tablet Take 1 tablet Daily for Cholesterol 90 tablet 3   vitamin C (ASCORBIC ACID) 500 MG tablet Take 500 mg by mouth daily.     VITAMIN D PO Take 5,000 Units by mouth. Takes 1 to 2 capsules daily.     clomiPRAMINE (ANAFRANIL) 50 MG capsule Take 1 capsule (50 mg total) by mouth at bedtime. 90 capsule 2   clonazePAM (KLONOPIN) 1 MG tablet Take 1.5 tablets (1.5 mg total) by mouth at bedtime. 45 tablet 5   QUEtiapine (SEROQUEL) 25 MG tablet Take 1 tablet (25 mg total) by mouth at bedtime. 90 tablet 1   No current facility-administered medications for this visit.    Medication Side Effects: None, dry  Allergies:  Allergies  Allergen Reactions   Prednisone     Altered mental state   Penicillins    Asa [Aspirin] Diarrhea   Levaquin [Levofloxacin] Other (See Comments)    Joint Pain   Macrolides And Ketolides Nausea And Vomiting   Paxil [Paroxetine Hcl] Nausea And Vomiting   Tetracyclines & Related Nausea And Vomiting   Zoloft [Sertraline Hcl] Nausea And Vomiting   Sulfa Antibiotics Rash   Trimethoprim Rash    Past Medical History:  Diagnosis Date   Anemia    mild   Anxiety    Colon polyp    hyperplastic   Diverticulosis    Esophageal stricture    GERD (gastroesophageal reflux disease)    no s/s now- off medicines   Hyperlipidemia    Palpitations    Prediabetes    Tachycardia     Family History  Problem Relation Age of Onset    Hypertension Mother    Heart disease Mother    Kidney cancer Father    Emphysema Father        smoked   Colon cancer Paternal Aunt    Colon polyps Neg Hx    Esophageal cancer Neg Hx    Rectal cancer Neg Hx    Stomach cancer Neg Hx     Social History   Socioeconomic History   Marital status: Married    Spouse name: Not on file   Number of children: 1   Years of education: Not on file   Highest education level: Not on file  Occupational History   Occupation: Librarian  Tobacco Use   Smoking status: Never    Passive exposure: Past   Smokeless tobacco: Never  Vaping Use   Vaping Use: Never used  Substance and Sexual Activity   Alcohol use: No    Alcohol/week: 0.0 standard drinks of alcohol   Drug use: No   Sexual activity: Not on file  Other Topics Concern   Not on file  Social History Narrative   Not on file   Social Determinants of Health   Financial Resource Strain: Not on file  Food Insecurity: Not on file  Transportation Needs: Not on file  Physical Activity: Not on file  Stress: Not on file  Social Connections: Not on file  Intimate Partner Violence: Not on file    Past Medical History, Surgical history, Social history, and Family history were reviewed and updated as appropriate.  Son has severe anxiety and is treated also.  Please see review of systems for further details on the patient's review from today.   Objective:   Physical Exam:  LMP 12/09/1994   Physical Exam Constitutional:      General: She is not in acute distress.    Appearance: She is well-developed.  Musculoskeletal:        General: No deformity.  Neurological:     Mental Status: She is alert and oriented to person, place, and time.     Motor: No tremor.     Coordination: Coordination normal.     Gait: Gait normal.  Psychiatric:        Attention and Perception: She is attentive. She does not perceive auditory hallucinations.        Mood and Affect: Mood is anxious. Mood is  not depressed. Affect is not labile, blunt, angry, tearful or inappropriate.        Speech: Speech normal. Speech is not slurred.        Behavior: Behavior normal.        Thought Content: Thought content normal. Thought content is not delusional. Thought content does not include homicidal or suicidal ideation. Thought content does not include suicidal plan.        Cognition and Memory: Cognition normal.        Judgment: Judgment normal.     Comments: Insight intact. Residual obsessions if not busy.. No auditory or visual hallucinations. No delusions.  No rumination  Neat .  No sedation. Pleasant and talkative, fluent      Lab Review:     Component Value Date/Time   NA 136 08/20/2022 1514   K 4.2 08/20/2022 1514   CL 101 08/20/2022 1514   CO2 27 08/20/2022 1514   GLUCOSE 85 08/20/2022 1514   BUN 10 08/20/2022 1514   CREATININE 0.84 08/20/2022 1514   CALCIUM 9.9 08/20/2022 1514   PROT 7.2 08/20/2022 1514   ALBUMIN 4.0 05/13/2017 1510   AST 7 (L) 08/20/2022 1514   ALT 8 08/20/2022 1514   ALKPHOS 61 05/13/2017 1510   BILITOT 0.5 08/20/2022 1514   GFRNONAA >60 06/18/2022 1525   GFRNONAA 77 02/15/2021 1546   GFRAA 89 02/15/2021 1546       Component Value Date/Time   WBC 5.8 08/20/2022 1514   RBC 4.77 08/20/2022 1514   HGB 14.0 08/20/2022 1514   HCT 42.7 08/20/2022 1514   PLT 245 08/20/2022 1514   MCV 89.5 08/20/2022 1514   MCH 29.4 08/20/2022 1514   MCHC 32.8 08/20/2022 1514   RDW 12.5 08/20/2022 1514   LYMPHSABS 2,268 08/20/2022 1514   MONOABS 0.8 12/18/2018 1628   EOSABS 110 08/20/2022 1514   BASOSABS 41 08/20/2022 1514    11/2022 EKG was normal.  .res Assessment: Plan:    Mixed obsessional thoughts and acts - Plan: clonazePAM (KLONOPIN) 1 MG tablet, QUEtiapine (SEROQUEL) 25 MG tablet, clomiPRAMINE (ANAFRANIL) 50 MG capsule  Generalized anxiety disorder - Plan: clonazePAM (KLONOPIN) 1 MG tablet, QUEtiapine (SEROQUEL) 25 MG tablet, clomiPRAMINE (ANAFRANIL) 50  MG capsule  Insomnia due to mental condition - Plan: clonazePAM (KLONOPIN) 1 MG tablet, QUEtiapine (SEROQUEL) 25 MG tablet  Major depression, recurrent, full remission ([place]) - Plan: QUEtiapine (SEROQUEL) 25 MG tablet    Marked  worsening of anxiety and depresssion without obvious worsening of OCD after STOPPING CLOMIPRAMINE ACCIDENTALLY  in 2020.  But cannot sleep without clonazepam .   Disc risks TCA including cardiac and risk increasing  resting heart rate. Disc pending cardiology appt and encourage her to go.  We discussed the short-term risks associated with benzodiazepines including sedation and increased fall risk among others.  Discussed long-term side effect risk including dependence, potential withdrawal symptoms, and the potential eventual dose-related risk of dementia.  But recent studies from 2020 dispute this association between benzodiazepines and dementia risk. Newer studies in 2020 do not support an association with dementia. Disc her fears of addiction.  Wants to eventually consider tapering off of it using the quetiapine to protect her sleep.  Disc her fears of dose reduction but it is time before she has SE related to age and the BZ.  Extensive discussion.  Supportive therapy on relapse prevention and what to do to deal with her job and anxiety. New stressor of pulm nodule and pending surgery for it.  Helped her process her thoughts about it.  Continue clomipramine 50 mg nightly (started about 04/2013) She doesn't feel she can reduce further at this time so continue Klonopin to 1.5 mg HS bc of age increase risk of SE. Unlikely to be able to stop it.  This appt was 30 mins.  FU 6 mos   Lynder Parents, MD, DFAPA   Please see After Visit Summary for patient specific instructions.  Future Appointments  Date Time Provider [place]  11/20/2022  4:15 PM Melrose Nakayama, MD [facility]  12/11/2022  1:15 PM Maryjane Hurter, MD [facility] None   02/18/2023  4:00 PM Unk Pinto, MD [facility] None  08/25/2023  3:00 PM Unk Pinto, MD [facility] None    No orders of the defined types were placed in this encounter.     -------------------------------

## 2022-11-19 ENCOUNTER — Ambulatory Visit: Payer: Medicare PPO | Admitting: Nurse Practitioner

## 2022-11-20 ENCOUNTER — Encounter: Payer: Self-pay | Admitting: Thoracic Surgery (Cardiothoracic Vascular Surgery)

## 2022-11-20 ENCOUNTER — Institutional Professional Consult (permissible substitution): Payer: Medicare PPO | Admitting: Thoracic Surgery (Cardiothoracic Vascular Surgery)

## 2022-11-20 VITALS — BP 102/65 | HR 81 | Resp 18 | Ht 66.0 in | Wt 148.0 lb

## 2022-11-20 DIAGNOSIS — R911 Solitary pulmonary nodule: Secondary | ICD-10-CM

## 2022-11-20 NOTE — Progress Notes (Signed)
Lab review called formula PCP is Unk Pinto, MD Referring Provider is Maryjane Hurter, MD  Chief Complaint  Patient presents with   Lung Lesion    Surgical consult/ Chest CT 10/16/22/ PFT's 10/30/22    HPI: Donna Dillon is sent for consultation regarding a groundglass opacity in the right upper lobe.  Donna Dillon is a 76 year old woman with a history of anxiety, reflux, esophageal stricture, hyperlipidemia, prediabetes, and palpitations.  In July she had an episode of shortness of breath with palpitations and pain across her back.  She thought she might be having a heart attack.  She had a workup which included a CT angiogram.  It showed a 9 mm.  Groundglass opacity in the right upper lobe.  She saw Dr. Verlee Monte.  A repeat CT in November showed the GGO was essentially unchanged.  There was no solid component.  She is a lifelong non-smoker.  She does complain of some shortness of breath with exertion and that is associated with a rapid heart rate.  She has had some cardiac workup in the past but I do not find anything other than an EKG in our system.  Zubrod Score: At the time of surgery this patient's most appropriate activity status/level should be described as: []     0    Normal activity, no symptoms [x]     1    Restricted in physical strenuous activity but ambulatory, able to do out light work []     2    Ambulatory and capable of self care, unable to do work activities, up and about >50 % of waking hours                              []     3    Only limited self care, in bed greater than 50% of waking hours []     4    Completely disabled, no self care, confined to bed or chair []     5    Moribund  Past Medical History:  Diagnosis Date   Anemia    mild   Anxiety    Colon polyp    hyperplastic   Diverticulosis    Esophageal stricture    GERD (gastroesophageal reflux disease)    no s/s now- off medicines   Hyperlipidemia    Palpitations    Prediabetes     Tachycardia     Past Surgical History:  Procedure Laterality Date   BREAST BIOPSY Left    CESAREAN SECTION     x1   COLONOSCOPY  2017   eye lift     POLYPECTOMY     TONSILLECTOMY     UPPER GASTROINTESTINAL ENDOSCOPY      Family History  Problem Relation Age of Onset   Hypertension Mother    Heart disease Mother    Kidney cancer Father    Emphysema Father        smoked   Colon cancer Paternal Aunt    Colon polyps Neg Hx    Esophageal cancer Neg Hx    Rectal cancer Neg Hx    Stomach cancer Neg Hx     Social History Social History   Tobacco Use   Smoking status: Never    Passive exposure: Past   Smokeless tobacco: Never  Vaping Use   Vaping Use: Never used  Substance Use Topics   Alcohol use: No    Alcohol/week: 0.0 standard drinks  of alcohol   Drug use: No    Current Outpatient Medications  Medication Sig Dispense Refill   atenolol (TENORMIN) 25 MG tablet TAKE 1 TABLET DAILY FOR BLOOD PRESSURE. 90 tablet 3   Calcium 1500 MG tablet Take 1,500 mg by mouth daily.      clomiPRAMINE (ANAFRANIL) 50 MG capsule Take 1 capsule (50 mg total) by mouth at bedtime. 90 capsule 2   clonazePAM (KLONOPIN) 1 MG tablet Take 1.5 tablets (1.5 mg total) by mouth at bedtime. 45 tablet 5   ezetimibe (ZETIA) 10 MG tablet Take 1 tablet Daily for Cholesterol 90 tablet 3   fish oil-omega-3 fatty acids 1000 MG capsule Take 1 g by mouth daily.     magnesium 30 MG tablet Take 30 mg by mouth daily.      Multiple Vitamin (MULTIVITAMIN) tablet Take 1 tablet by mouth daily.     Multiple Vitamins-Minerals (EYE VITAMINS PO) Take 1 capsule by mouth daily. Ocusight Vitamins     QUEtiapine (SEROQUEL) 25 MG tablet Take 1 tablet (25 mg total) by mouth at bedtime. 90 tablet 1   vitamin C (ASCORBIC ACID) 500 MG tablet Take 500 mg by mouth daily.     VITAMIN D PO Take 5,000 Units by mouth. Takes 1 to 2 capsules daily.     rosuvastatin (CRESTOR) 20 MG tablet Take 1 tablet Daily for Cholesterol (Patient not  taking: Reported on 11/20/2022) 90 tablet 3   No current facility-administered medications for this visit.    Allergies  Allergen Reactions   Prednisone     Altered mental state   Penicillins    Asa [Aspirin] Diarrhea   Levaquin [Levofloxacin] Other (See Comments)    Joint Pain   Macrolides And Ketolides Nausea And Vomiting   Paxil [Paroxetine Hcl] Nausea And Vomiting   Tetracyclines & Related Nausea And Vomiting   Zoloft [Sertraline Hcl] Nausea And Vomiting   Sulfa Antibiotics Rash   Trimethoprim Rash    Review of Systems  Constitutional:  Positive for activity change and fatigue.  Eyes:  Positive for visual disturbance (Blurry).  Respiratory:  Positive for cough and shortness of breath.   Cardiovascular:  Positive for palpitations. Negative for chest pain.  Gastrointestinal:  Positive for abdominal pain (Reflux).  Psychiatric/Behavioral:  The patient is nervous/anxious.   All other systems reviewed and are negative.   BP 102/65 (BP Location: Left Arm, Patient Position: Sitting)   Pulse 81   Resp 18   Ht 5\' 6"  (1.676 m)   Wt 148 lb (67.1 kg)   LMP 12/09/1994   SpO2 97% Comment: RA  BMI 23.89 kg/m  Physical Exam Vitals reviewed.  Constitutional:      General: She is not in acute distress.    Appearance: Normal appearance.  HENT:     Head: Normocephalic and atraumatic.  Eyes:     General: No scleral icterus.    Extraocular Movements: Extraocular movements intact.  Cardiovascular:     Rate and Rhythm: Normal rate and regular rhythm.     Heart sounds: Normal heart sounds. No murmur heard.    No friction rub. No gallop.  Pulmonary:     Effort: Pulmonary effort is normal. No respiratory distress.     Breath sounds: Normal breath sounds. No wheezing or rales.  Abdominal:     General: There is no distension.     Palpations: Abdomen is soft.  Skin:    General: Skin is warm and dry.  Neurological:  General: No focal deficit present.     Mental Status: She is  alert and oriented to person, place, and time.     Cranial Nerves: No cranial nerve deficit.     Motor: No weakness.    Diagnostic Tests: CT CHEST WITHOUT CONTRAST   TECHNIQUE: Multidetector CT imaging of the chest was performed using thin slice collimation for electromagnetic bronchoscopy planning purposes, without intravenous contrast.   RADIATION DOSE REDUCTION: This exam was performed according to the departmental dose-optimization program which includes automated exposure control, adjustment of the mA and/or kV according to patient size and/or use of iterative reconstruction technique.   COMPARISON:  Chest CTA 06/18/2022.   FINDINGS: Cardiovascular: The heart size is normal. No substantial pericardial effusion. Mild atherosclerotic calcification is noted in the wall of the thoracic aorta.   Mediastinum/Nodes: No mediastinal lymphadenopathy. No evidence for gross hilar lymphadenopathy although assessment is limited by the lack of intravenous contrast on the current study. The esophagus has normal imaging features. Moderate hiatal hernia evident. There is no axillary lymphadenopathy.   Lungs/Pleura: Stable 9 mm ground-glass opacity in the right upper lobe (image 20/series 3). No other suspicious pulmonary nodule or mass. Calcified granuloma noted peripheral right lower lobe. Subsegmental atelectasis or linear scarring noted in the inferior lungs bilaterally.   Upper Abdomen: 1.7 cm water density lesion upper pole right kidney is compatible with a cyst. No followup imaging is recommended.   Musculoskeletal: No worrisome lytic or sclerotic osseous abnormality.   IMPRESSION: 1. Stable 9 mm ground-glass opacity in the right upper lobe. CT is recommended every 2 years until 5 years of stability has been established. This recommendation follows the consensus statement: Guidelines for Management of Incidental Pulmonary Nodules Detected on CT Images: From the Fleischner  Society 2017; Radiology 2017; 284:228-243. 2. Moderate hiatal hernia. 3.  Aortic Atherosclerosis (ICD10-I70.0).     Electronically Signed   By: Misty Stanley M.D.   On: 10/18/2022 09:20 I personally reviewed the CT images.  There is a 9 mm groundglass opacity in the right upper lobe.  No solid component.  Hiatal hernia.  Impression: Donna Dillon is a 76 year old woman with a history of anxiety, reflux, esophageal stricture, hyperlipidemia, prediabetes, and palpitations.  She was found to have a groundglass opacity in the right upper lobe in July.  On follow-up CT the lesion is stable to possibly minimally enlarged.  Right upper lobe lung nodule- pure ground glass opacity with no solid component.  Infectious and inflammatory nodules as well as low-grade adenocarcinoma are in the differential diagnosis.  If adenocarcinoma this would be a Tis or T1, N0, stage Ia.  We discussed several options.  One would be to continue with radiographic follow-up.  That would certainly be acceptable in her case.  The second option would be to do a robotic navigational bronchoscopy for biopsy.  I discussed the issues regarding a negative result does not necessarily rule out the possibility of a cancer and she would still need continued follow-up.  The third and most aggressive option would be to resect the nodule.  The nodule would need to be marked with a robotic navigational bronchoscopy first.  We discussed the pros and cons of each approach and it really comes down to how she worried she is about the nodule and how aggressive she wants to be.  I did discuss what surgical resection would entail.  The plan would be to do a robotic right VATS for wedge resection.  Since it is  a pure groundglass opacity and his peripheral she would not need a lobectomy.  I informed her of the general nature of the procedure including the need for general anesthesia, the incisions to be used, the use of the surgical robot,  use of a drainage tube postoperatively, the expected hospital stay, and the overall recovery.  I informed her of the indications, risks, benefits, and alternatives.  She understands the risks include, but are not limited to death, MI, DVT, PE, bleeding, possible need for transfusion, infection, prolonged air leak, cardiac arrhythmias, as well as possibility of other unforeseeable complications.  She understands the degree of pain is unpredictable.   Plan:  She will think over her options and let Dr. Verlee Monte and myself know she would like to proceed.  I spent over 60 minutes in review of records, images, and in consultation with Mrs. Bellew today. Melrose Nakayama, MD [facility] [phone]

## 2022-12-10 NOTE — Progress Notes (Addendum)
Synopsis: Referred for pulmonary nodule by Lucky Cowboy, MD  Subjective:   PATIENT ID: Donna Dillon GENDER: female DOB: 07/18/1946, MRN: [id number]  No chief complaint on file.  76yF with history of GERD, esophageal stricture, emphysema referred for pulmonary nodule  She had CTA Chest done for dyspnea and rapid heart rate at [facility]. Revealed 9mm ggo RUL. She has no cough now. Did have productive cough last year but has cleared up. No fever, chills. Has had pneumonia 3-4 times last was 2 years ago.   She has no family history of lung cancer but there were a lot of other cancers in her family  She is a never smoker but did have secondhand smoke exposure growing up. She worked as a Comptroller. Even worked for 10 years after that in [employer]. No MJ, vaping.    Interval HPI  Stable RUL 9mm ggo  Intermittent productive cough, no fever, unintentional weight loss.   Otherwise pertinent review of systems is negative.  Past Medical History:  Diagnosis Date   Anemia    mild   Anxiety    Colon polyp    hyperplastic   Diverticulosis    Esophageal stricture    GERD (gastroesophageal reflux disease)    no s/s now- off medicines   Hyperlipidemia    Palpitations    Prediabetes    Tachycardia      Family History  Problem Relation Age of Onset   Hypertension Mother    Heart disease Mother    Kidney cancer Father    Emphysema Father        smoked   Colon cancer Paternal Aunt    Colon polyps Neg Hx    Esophageal cancer Neg Hx    Rectal cancer Neg Hx    Stomach cancer Neg Hx      Past Surgical History:  Procedure Laterality Date   BREAST BIOPSY Left    CESAREAN SECTION     x1   COLONOSCOPY  2017   eye lift     POLYPECTOMY     TONSILLECTOMY     UPPER GASTROINTESTINAL ENDOSCOPY      Social History   Socioeconomic History   Marital status: Married    Spouse name: Not on file   Number of children: 1   Years of education: Not on file   Highest  education level: Not on file  Occupational History   Occupation: Librarian  Tobacco Use   Smoking status: Never    Passive exposure: Past   Smokeless tobacco: Never  Vaping Use   Vaping Use: Never used  Substance and Sexual Activity   Alcohol use: No    Alcohol/week: 0.0 standard drinks of alcohol   Drug use: No   Sexual activity: Not on file  Other Topics Concern   Not on file  Social History Narrative   Not on file   Social Determinants of Health   Financial Resource Strain: Not on file  Food Insecurity: Not on file  Transportation Needs: Not on file  Physical Activity: Not on file  Stress: Not on file  Social Connections: Not on file  Intimate Partner Violence: Not on file     Allergies  Allergen Reactions   Prednisone     Altered mental state   Penicillins    Asa [Aspirin] Diarrhea   Levaquin [Levofloxacin] Other (See Comments)    Joint Pain   Macrolides And Ketolides Nausea And Vomiting   Paxil [Paroxetine Hcl] Nausea And Vomiting  Tetracyclines & Related Nausea And Vomiting   Zoloft [Sertraline Hcl] Nausea And Vomiting   Sulfa Antibiotics Rash   Trimethoprim Rash     Outpatient Medications Prior to Visit  Medication Sig Dispense Refill   atenolol (TENORMIN) 25 MG tablet TAKE 1 TABLET DAILY FOR BLOOD PRESSURE. 90 tablet 3   Calcium 1500 MG tablet Take 1,500 mg by mouth daily.      clomiPRAMINE (ANAFRANIL) 50 MG capsule Take 1 capsule (50 mg total) by mouth at bedtime. 90 capsule 2   clonazePAM (KLONOPIN) 1 MG tablet Take 1.5 tablets (1.5 mg total) by mouth at bedtime. 45 tablet 5   ezetimibe (ZETIA) 10 MG tablet Take 1 tablet Daily for Cholesterol 90 tablet 3   fish oil-omega-3 fatty acids 1000 MG capsule Take 1 g by mouth daily.     magnesium 30 MG tablet Take 30 mg by mouth daily.      Multiple Vitamin (MULTIVITAMIN) tablet Take 1 tablet by mouth daily.     Multiple Vitamins-Minerals (EYE VITAMINS PO) Take 1 capsule by mouth daily. Ocusight Vitamins      QUEtiapine (SEROQUEL) 25 MG tablet Take 1 tablet (25 mg total) by mouth at bedtime. 90 tablet 1   rosuvastatin (CRESTOR) 20 MG tablet Take 1 tablet Daily for Cholesterol (Patient not taking: Reported on 11/20/2022) 90 tablet 3   vitamin C (ASCORBIC ACID) 500 MG tablet Take 500 mg by mouth daily.     VITAMIN D PO Take 5,000 Units by mouth. Takes 1 to 2 capsules daily.     No facility-administered medications prior to visit.       Objective:   Physical Exam:  General appearance: 77 y.o., female, NAD, conversant  Eyes: anicteric sclerae; PERRL, tracking appropriately HENT: NCAT; MMM Neck: Trachea midline; no lymphadenopathy, no JVD Lungs: CTAB, no crackles, no wheeze, with normal respiratory effort CV: RRR, no murmur  Abdomen: Soft, non-tender; non-distended, BS present  Extremities: No peripheral edema, warm Skin: Normal turgor and texture; no rash Psych: Appropriate affect Neuro: Alert and oriented to person and place, no focal deficit     There were no vitals filed for this visit.     on RA BMI Readings from Last 3 Encounters:  11/20/22 23.89 kg/m  10/24/22 24.69 kg/m  08/20/22 24.73 kg/m   Wt Readings from Last 3 Encounters:  11/20/22 148 lb (67.1 kg)  10/24/22 153 lb (69.4 kg)  08/20/22 153 lb 3.2 oz (69.5 kg)     CBC    Component Value Date/Time   WBC 5.8 08/20/2022 1514   RBC 4.77 08/20/2022 1514   HGB 14.0 08/20/2022 1514   HCT 42.7 08/20/2022 1514   PLT 245 08/20/2022 1514   MCV 89.5 08/20/2022 1514   MCH 29.4 08/20/2022 1514   MCHC 32.8 08/20/2022 1514   RDW 12.5 08/20/2022 1514   LYMPHSABS 2,268 08/20/2022 1514   MONOABS 0.8 12/18/2018 1628   EOSABS 110 08/20/2022 1514   BASOSABS 41 08/20/2022 1514      Chest Imaging: TA Chest 06/18/22 reviewed by me with 9mm ggo, no concerning adenopathy and no clear emphysema  CT Super D 10/18/22 reviewed by me stable RUL 9mm ggo. No suspicious adenopathy  Pulmonary Functions Testing Results:     Latest Ref Rng & Units 10/30/2022   11:21 AM  PFT Results  FVC-Pre L 2.79   FVC-Predicted Pre % 91   FVC-Post L 2.83   FVC-Predicted Post % 92   Pre FEV1/FVC % % 60  Post FEV1/FCV % % 65   FEV1-Pre L 1.68   FEV1-Predicted Pre % 73   FEV1-Post L 1.84   DLCO uncorrected ml/min/mmHg 22.86   DLCO UNC% % 111   DLCO corrected ml/min/mmHg 22.86   DLCO COR %Predicted % 111   DLVA Predicted % 107   TLC L 6.09   TLC % Predicted % 113   RV % Predicted % 154    Mild obstruction, air trapping    Assessment & Plan:   # RUL 9mm GGO: Calculators with range of 10-17% chance malignancy over next 2-4 years but even though nonsmoker I still think better chance than not that it's a low grade adenocarcinoma with limited likelihood of progression in near term. Discussed options of robotic bronch under general with risks of PTX 1% at our institution, very low risk of respiratory failure and bleeding, referral for combination/single anesthetic case with cardiothoracic surgery for possible wedge vs lobectomy, watchful waiting with surveillance CT Chest. It doesn't sound like she'd be completely reassured about the nature of this lesion with a negative biopsy.   Plan: - PFTs  - referral placed to [facility] for consideration of combination navigation bronchoscopy/RATS wedge vs lobectomy     [name], MD [facility] 12/10/2022 5:10 PM

## 2022-12-10 NOTE — H&P (View-Only) (Signed)
Synopsis: Referred for pulmonary nodule by Lucky Cowboy, MD  Subjective:   PATIENT ID: Donna Dillon GENDER: female DOB: March 17, 1946, MRN: [id number]  Chief Complaint  Patient presents with   Follow-up    Wants to discuss option for nodule and review PFT.    77yF with history of GERD, esophageal stricture, emphysema referred for pulmonary nodule  She had CTA Chest done for dyspnea and rapid heart rate at [facility]. Revealed 66mm ggo RUL. She has no cough now. Did have productive cough last year but has cleared up. No fever, chills. Has had pneumonia 3-4 times last was 2 years ago.   She has no family history of lung cancer but there were a lot of other cancers in her family  She is a never smoker but did have secondhand smoke exposure growing up. She worked as a Comptroller. Even worked for 10 years after that in [employer]. No MJ, vaping.    Interval HPI  Stable RUL 38mm ggo  Intermittent productive cough, no fever, unintentional weight loss.   Otherwise pertinent review of systems is negative.  Past Medical History:  Diagnosis Date   Anemia    mild   Anxiety    Colon polyp    hyperplastic   Diverticulosis    Esophageal stricture    GERD (gastroesophageal reflux disease)    no s/s now- off medicines   Hyperlipidemia    Palpitations    Prediabetes    Tachycardia      Family History  Problem Relation Age of Onset   Hypertension Mother    Heart disease Mother    Kidney cancer Father    Emphysema Father        smoked   Colon cancer Paternal Aunt    Colon polyps Neg Hx    Esophageal cancer Neg Hx    Rectal cancer Neg Hx    Stomach cancer Neg Hx      Past Surgical History:  Procedure Laterality Date   BREAST BIOPSY Left    CESAREAN SECTION     x1   COLONOSCOPY  2017   eye lift     POLYPECTOMY     TONSILLECTOMY     UPPER GASTROINTESTINAL ENDOSCOPY      Social History   Socioeconomic History   Marital status: Married    Spouse name: Not on  file   Number of children: 1   Years of education: Not on file   Highest education level: Not on file  Occupational History   Occupation: Librarian  Tobacco Use   Smoking status: Never    Passive exposure: Past   Smokeless tobacco: Never  Vaping Use   Vaping Use: Never used  Substance and Sexual Activity   Alcohol use: No    Alcohol/week: 0.0 standard drinks of alcohol   Drug use: No   Sexual activity: Not on file  Other Topics Concern   Not on file  Social History Narrative   Not on file   Social Determinants of Health   Financial Resource Strain: Not on file  Food Insecurity: Not on file  Transportation Needs: Not on file  Physical Activity: Not on file  Stress: Not on file  Social Connections: Not on file  Intimate Partner Violence: Not on file     Allergies  Allergen Reactions   Prednisone     Altered mental state   Penicillins    Asa [Aspirin] Diarrhea   Levaquin [Levofloxacin] Other (See Comments)  Joint Pain   Macrolides And Ketolides Nausea And Vomiting   Paxil [Paroxetine Hcl] Nausea And Vomiting   Tetracyclines & Related Nausea And Vomiting   Zoloft [Sertraline Hcl] Nausea And Vomiting   Sulfa Antibiotics Rash   Trimethoprim Rash     Outpatient Medications Prior to Visit  Medication Sig Dispense Refill   atenolol (TENORMIN) 25 MG tablet TAKE 1 TABLET DAILY FOR BLOOD PRESSURE. 90 tablet 3   Calcium 1500 MG tablet Take 1,500 mg by mouth daily.      clomiPRAMINE (ANAFRANIL) 50 MG capsule Take 1 capsule (50 mg total) by mouth at bedtime. 90 capsule 2   clonazePAM (KLONOPIN) 1 MG tablet Take 1.5 tablets (1.5 mg total) by mouth at bedtime. 45 tablet 5   ezetimibe (ZETIA) 10 MG tablet Take 1 tablet Daily for Cholesterol 90 tablet 3   fish oil-omega-3 fatty acids 1000 MG capsule Take 1 g by mouth daily.     magnesium 30 MG tablet Take 30 mg by mouth daily.      Multiple Vitamin (MULTIVITAMIN) tablet Take 1 tablet by mouth daily.     Multiple  Vitamins-Minerals (EYE VITAMINS PO) Take 1 capsule by mouth daily. [employer] Vitamins     QUEtiapine (SEROQUEL) 25 MG tablet Take 1 tablet (25 mg total) by mouth at bedtime. 90 tablet 1   vitamin C (ASCORBIC ACID) 500 MG tablet Take 500 mg by mouth daily.     VITAMIN D PO Take 5,000 Units by mouth. Takes 1 to 2 capsules daily.     rosuvastatin (CRESTOR) 20 MG tablet Take 1 tablet Daily for Cholesterol 90 tablet 3   No facility-administered medications prior to visit.       Objective:   Physical Exam:  General appearance: 77 y.o., female, NAD, conversant, female, NAD, conversant  Eyes: anicteric sclerae; PERRL, tracking appropriately HENT: NCAT; MMM Neck: Trachea midline; no lymphadenopathy, no JVD Lungs: CTAB, no crackles, no wheeze, with normal respiratory effort CV: RRR, no murmur  Abdomen: Soft, non-tender; non-distended, BS present  Extremities: No peripheral edema, warm Skin: Normal turgor and texture; no rash Psych: Appropriate affect Neuro: Alert and oriented to person and place, no focal deficit     Vitals:   12/11/22 1317  BP: 102/64  Pulse: 65  Temp: 98 F (36.7 C)  TempSrc: Oral  SpO2: 97%  Weight: 150 lb 9.6 oz (68.3 kg)  Height: 5' 6.5" (1.689 m)     97% on RA BMI Readings from Last 3 Encounters:  12/11/22 23.94 kg/m  11/20/22 23.89 kg/m  10/24/22 24.69 kg/m   Wt Readings from Last 3 Encounters:  12/11/22 150 lb 9.6 oz (68.3 kg)  11/20/22 148 lb (67.1 kg)  10/24/22 153 lb (69.4 kg)     CBC    Component Value Date/Time   WBC 5.8 08/20/2022 1514   RBC 4.77 08/20/2022 1514   HGB 14.0 08/20/2022 1514   HCT 42.7 08/20/2022 1514   PLT 245 08/20/2022 1514   MCV 89.5 08/20/2022 1514   MCH 29.4 08/20/2022 1514   MCHC 32.8 08/20/2022 1514   RDW 12.5 08/20/2022 1514   LYMPHSABS 2,268 08/20/2022 1514   MONOABS 0.8 12/18/2018 1628   EOSABS 110 08/20/2022 1514   BASOSABS 41 08/20/2022 1514      Chest Imaging: TA Chest 06/18/22 reviewed by me with 62mm ggo, no  concerning adenopathy and no clear emphysema  CT Super D 10/18/22 reviewed by me stable RUL 97mm ggo. No suspicious adenopathy  Pulmonary Functions Testing Results:  Latest Ref Rng & Units 10/30/2022   11:21 AM  PFT Results  FVC-Pre L 2.79   FVC-Predicted Pre % 91   FVC-Post L 2.83   FVC-Predicted Post % 92   Pre FEV1/FVC % % 60   Post FEV1/FCV % % 65   FEV1-Pre L 1.68   FEV1-Predicted Pre % 73   FEV1-Post L 1.84   DLCO uncorrected ml/min/mmHg 22.86   DLCO UNC% % 111   DLCO corrected ml/min/mmHg 22.86   DLCO COR %Predicted % 111   DLVA Predicted % 107   TLC L 6.09   TLC % Predicted % 113   RV % Predicted % 154    Mild obstruction, air trapping    Assessment & Plan:   # RUL 84mm GGO: Calculators with range of 10-17% chance malignancy over next 2-4 years but even though nonsmoker I still think better chance than not that it's a low grade adenocarcinoma with limited likelihood of progression in near term. Discussed options of robotic bronch under general with risks of PTX 1% at our institution, very low risk of respiratory failure and bleeding, referral for combination/single anesthetic case with cardiothoracic surgery for possible wedge vs lobectomy, watchful waiting with surveillance CT Chest. It doesn't sound like she'd be completely reassured about the nature of this lesion with a negative biopsy.   # possible COPD related to secondhand smoke exposure  # Mild obstruction, air trapping  Plan: - I recommended either CT surveillance or proceeding directly to dye-marking/resection. I would say diagnostic yield 50-60% of biopsy alone with this ggo lesion and neither negative nor nondiagnostic result are helpful. Her lung function makes it look promising she has sufficient reserve for resection and this is as good condition as she'll ever be in to be able to tolerate a surgery. She is still weighing these choices and will let us know what she prefers.  - declines inhalers for her  relatively asymptomatic mild obstruction     [name], MD [facility] 12/11/2022 1:38 PM

## 2022-12-11 ENCOUNTER — Encounter: Payer: Self-pay | Admitting: Student

## 2022-12-11 ENCOUNTER — Ambulatory Visit: Payer: Medicare PPO | Admitting: Student

## 2022-12-11 ENCOUNTER — Telehealth: Payer: Self-pay | Admitting: Student

## 2022-12-11 VITALS — BP 102/64 | HR 65 | Temp 98.0°F | Ht 66.5 in | Wt 150.6 lb

## 2022-12-11 DIAGNOSIS — R911 Solitary pulmonary nodule: Secondary | ICD-10-CM

## 2022-12-11 DIAGNOSIS — J449 Chronic obstructive pulmonary disease, unspecified: Secondary | ICD-10-CM | POA: Diagnosis not present

## 2022-12-11 NOTE — Patient Instructions (Signed)
-   I don't think I would bother with biopsy alone because I don't think a negative result means very much. We would still be left with decision to either watch with CT scans or proceed with surgery. - There is a nonzero chance that this lung cancer never progresses significantly over your lifetime.  - That said, if you underwent surgery to take out this spot, it can be curative.

## 2022-12-11 NOTE — Telephone Encounter (Signed)
Called to follow up on our discussion from earlier today. It sounds like she would prefer to move forward with dye-marking and resection. I said I would reach out to Dr. Leonarda Salon office and encouraged her to do the same.  [place]

## 2022-12-12 NOTE — Telephone Encounter (Signed)
Rocky Morel  Can any of the following dates/times work for a Dentist dye-marking/RATS robotic resection with Dr. Roxan Hockey?   Friday 1/12 at 7:30am? (I know Opal Sidles has case but didn't know if this would take priority)  Monday 1/15 at 7:30am? Friday 1/19 at 7:30am?  Last option would be 1/17 at 8:30 but I would need to reschedule a few clinic patients.   Thanks!!  Nate

## 2022-12-12 NOTE — Telephone Encounter (Signed)
-----   Message from Laury Deep, RN sent at 12/12/2022  2:08 PM EST ----- Regarding: RE: dye marking/resection Dr. Verlee Monte,   Do any of these dates work for you?  Friday 1/12 at 7:30am? Monday 1/15 at 7:30am? Wednesday 1/17 at 8:30am? Friday 1/19 at 7:30am?  Thanks,  Thurmond Butts ----- Message ----- From: Melrose Nakayama, MD Sent: 12/12/2022   2:03 PM EST To: Maryjane Hurter, MD; Ladon Applebaum, RN; # Subject: RE: dye marking/resection                      Nate  I will have my office call yours and make arrangements  Richardson Landry ----- Message ----- From: Maryjane Hurter, MD Sent: 12/11/2022   5:51 PM EST To: Melrose Nakayama, MD Subject: dye marking/resection                          Sounds like she would like to go ahead with dye-marking and resection. Happy to help coordinate with you for it. Let me know if there's a good date/time to target!  Nate

## 2022-12-13 ENCOUNTER — Other Ambulatory Visit: Payer: Self-pay | Admitting: *Deleted

## 2022-12-13 ENCOUNTER — Encounter: Payer: Self-pay | Admitting: *Deleted

## 2022-12-13 DIAGNOSIS — R911 Solitary pulmonary nodule: Secondary | ICD-10-CM

## 2022-12-13 NOTE — Telephone Encounter (Signed)
Pt has been scheduled for 1/17 at 8:30 by Larene Beach to be followed by Dr Roxan Hockey.  I spoke to pt and gave her appt info and made her aware Thurmond Butts would be calling with Dr Hendrickson's appt info this afternoon per Ryan's secure chat.  Pt to get covid test at preadmit on 1/15 per Thurmond Butts and I gave pt this info.  Dr Verlee Monte - pt wanted to know if she needed an appt with you prior to the bronch?

## 2022-12-13 NOTE — Telephone Encounter (Signed)
Donna Dillon states 1/17 at 8:30 would be the option available at this time.

## 2022-12-13 NOTE — Telephone Encounter (Signed)
I have sent secure chat to Shannon/NM regarding availability for this patient.

## 2022-12-16 NOTE — Telephone Encounter (Signed)
No need for appointment - happy to call her if she has any questions about the procedure.

## 2022-12-17 ENCOUNTER — Encounter: Payer: Medicare PPO | Admitting: Thoracic Surgery (Cardiothoracic Vascular Surgery)

## 2022-12-17 NOTE — Telephone Encounter (Signed)
Called the pt and there was no answer- LMTCB    

## 2022-12-17 NOTE — Telephone Encounter (Signed)
Pt called and informed her that no appt needed.

## 2022-12-23 ENCOUNTER — Encounter (HOSPITAL_COMMUNITY): Payer: Self-pay

## 2022-12-23 ENCOUNTER — Encounter (HOSPITAL_COMMUNITY)
Admission: RE | Admit: 2022-12-23 | Discharge: 2022-12-23 | Disposition: A | Discharge status: Home / Self Care | Payer: Medicare PPO | Source: Ambulatory Visit | Attending: Student | Admitting: Student

## 2022-12-23 ENCOUNTER — Other Ambulatory Visit: Payer: Self-pay

## 2022-12-23 ENCOUNTER — Ambulatory Visit (HOSPITAL_COMMUNITY)
Admission: RE | Admit: 2022-12-23 | Discharge: 2022-12-23 | Disposition: A | Discharge status: Home / Self Care | Payer: Medicare PPO | Source: Ambulatory Visit | Attending: Thoracic Surgery (Cardiothoracic Vascular Surgery) | Admitting: Thoracic Surgery (Cardiothoracic Vascular Surgery)

## 2022-12-23 VITALS — BP 106/75 | HR 79 | Temp 98.0°F | Resp 17 | Ht 66.5 in | Wt 150.0 lb

## 2022-12-23 DIAGNOSIS — R0602 Shortness of breath: Secondary | ICD-10-CM | POA: Diagnosis not present

## 2022-12-23 DIAGNOSIS — E785 Hyperlipidemia, unspecified: Secondary | ICD-10-CM | POA: Diagnosis present

## 2022-12-23 DIAGNOSIS — C3490 Malignant neoplasm of unspecified part of unspecified bronchus or lung: Secondary | ICD-10-CM | POA: Insufficient documentation

## 2022-12-23 DIAGNOSIS — J9383 Other pneumothorax: Secondary | ICD-10-CM | POA: Diagnosis not present

## 2022-12-23 DIAGNOSIS — R911 Solitary pulmonary nodule: Secondary | ICD-10-CM | POA: Diagnosis present

## 2022-12-23 DIAGNOSIS — Z825 Family history of asthma and other chronic lower respiratory diseases: Secondary | ICD-10-CM | POA: Diagnosis not present

## 2022-12-23 DIAGNOSIS — Z8051 Family history of malignant neoplasm of kidney: Secondary | ICD-10-CM | POA: Diagnosis not present

## 2022-12-23 DIAGNOSIS — Z1152 Encounter for screening for COVID-19: Secondary | ICD-10-CM | POA: Diagnosis not present

## 2022-12-23 DIAGNOSIS — Z01818 Encounter for other preprocedural examination: Secondary | ICD-10-CM | POA: Diagnosis not present

## 2022-12-23 DIAGNOSIS — I10 Essential (primary) hypertension: Secondary | ICD-10-CM

## 2022-12-23 DIAGNOSIS — I700 Atherosclerosis of aorta: Secondary | ICD-10-CM | POA: Diagnosis not present

## 2022-12-23 DIAGNOSIS — J439 Emphysema, unspecified: Secondary | ICD-10-CM | POA: Diagnosis not present

## 2022-12-23 DIAGNOSIS — C3411 Malignant neoplasm of upper lobe, right bronchus or lung: Secondary | ICD-10-CM | POA: Diagnosis not present

## 2022-12-23 DIAGNOSIS — J9811 Atelectasis: Secondary | ICD-10-CM | POA: Diagnosis not present

## 2022-12-23 DIAGNOSIS — R490 Dysphonia: Secondary | ICD-10-CM | POA: Diagnosis not present

## 2022-12-23 DIAGNOSIS — R9431 Abnormal electrocardiogram [ECG] [EKG]: Secondary | ICD-10-CM | POA: Diagnosis present

## 2022-12-23 DIAGNOSIS — J449 Chronic obstructive pulmonary disease, unspecified: Secondary | ICD-10-CM | POA: Diagnosis not present

## 2022-12-23 DIAGNOSIS — J90 Pleural effusion, not elsewhere classified: Secondary | ICD-10-CM | POA: Diagnosis not present

## 2022-12-23 DIAGNOSIS — Z800 Family history of malignant neoplasm of digestive organs: Secondary | ICD-10-CM | POA: Diagnosis not present

## 2022-12-23 DIAGNOSIS — K219 Gastro-esophageal reflux disease without esophagitis: Secondary | ICD-10-CM | POA: Diagnosis present

## 2022-12-23 DIAGNOSIS — J939 Pneumothorax, unspecified: Secondary | ICD-10-CM | POA: Diagnosis not present

## 2022-12-23 DIAGNOSIS — Z8249 Family history of ischemic heart disease and other diseases of the circulatory system: Secondary | ICD-10-CM | POA: Diagnosis not present

## 2022-12-23 DIAGNOSIS — F419 Anxiety disorder, unspecified: Secondary | ICD-10-CM | POA: Diagnosis present

## 2022-12-23 DIAGNOSIS — E871 Hypo-osmolality and hyponatremia: Secondary | ICD-10-CM | POA: Diagnosis not present

## 2022-12-23 DIAGNOSIS — Z8719 Personal history of other diseases of the digestive system: Secondary | ICD-10-CM | POA: Diagnosis not present

## 2022-12-23 DIAGNOSIS — K449 Diaphragmatic hernia without obstruction or gangrene: Secondary | ICD-10-CM | POA: Diagnosis present

## 2022-12-23 DIAGNOSIS — D62 Acute posthemorrhagic anemia: Secondary | ICD-10-CM | POA: Diagnosis not present

## 2022-12-23 DIAGNOSIS — R846 Abnormal cytological findings in specimens from respiratory organs and thorax: Secondary | ICD-10-CM | POA: Diagnosis not present

## 2022-12-23 DIAGNOSIS — Z7722 Contact with and (suspected) exposure to environmental tobacco smoke (acute) (chronic): Secondary | ICD-10-CM | POA: Diagnosis present

## 2022-12-23 DIAGNOSIS — J9571 Accidental puncture and laceration of a respiratory system organ or structure during a respiratory system procedure: Secondary | ICD-10-CM | POA: Diagnosis not present

## 2022-12-23 DIAGNOSIS — Z79899 Other long term (current) drug therapy: Secondary | ICD-10-CM | POA: Diagnosis not present

## 2022-12-23 DIAGNOSIS — R7303 Prediabetes: Secondary | ICD-10-CM | POA: Diagnosis present

## 2022-12-23 DIAGNOSIS — R053 Chronic cough: Secondary | ICD-10-CM | POA: Diagnosis not present

## 2022-12-23 DIAGNOSIS — R918 Other nonspecific abnormal finding of lung field: Secondary | ICD-10-CM | POA: Diagnosis not present

## 2022-12-23 HISTORY — DX: Pneumonia, unspecified organism: J18.9

## 2022-12-23 HISTORY — DX: Other specified postprocedural states: Z98.890

## 2022-12-23 HISTORY — DX: Other specified postprocedural states: R11.2

## 2022-12-23 LAB — TYPE AND SCREEN
ABO/RH(D): A POS
Antibody Screen: NEGATIVE

## 2022-12-23 LAB — CBC
HCT: 44 % (ref 36.0–46.0)
Hemoglobin: 14.2 g/dL (ref 12.0–15.0)
MCH: 29.3 pg (ref 26.0–34.0)
MCHC: 32.3 g/dL (ref 30.0–36.0)
MCV: 90.7 fL (ref 80.0–100.0)
Platelets: 238 10*3/uL (ref 150–400)
RBC: 4.85 MIL/uL (ref 3.87–5.11)
RDW: 12.9 % (ref 11.5–15.5)
WBC: 7.5 10*3/uL (ref 4.0–10.5)
nRBC: 0 % (ref 0.0–0.2)

## 2022-12-23 LAB — COMPREHENSIVE METABOLIC PANEL
ALT: 12 U/L (ref 0–44)
AST: 12 U/L — ABNORMAL LOW (ref 15–41)
Albumin: 4 g/dL (ref 3.5–5.0)
Alkaline Phosphatase: 69 U/L (ref 38–126)
Anion gap: 10 (ref 5–15)
BUN: 16 mg/dL (ref 8–23)
CO2: 23 mmol/L (ref 22–32)
Calcium: 9.6 mg/dL (ref 8.9–10.3)
Chloride: 104 mmol/L (ref 98–111)
Creatinine, Ser: 0.91 mg/dL (ref 0.44–1.00)
GFR, Estimated: >60 mL/min (ref >60)
Glucose, Bld: 103 mg/dL — ABNORMAL HIGH (ref 70–99)
Potassium: 4.1 mmol/L (ref 3.5–5.1)
Sodium: 137 mmol/L (ref 135–145)
Total Bilirubin: 0.4 mg/dL (ref 0.3–1.2)
Total Protein: 6.9 g/dL (ref 6.5–8.1)

## 2022-12-23 LAB — URINALYSIS, ROUTINE W REFLEX MICROSCOPIC
Glucose, UA: NEGATIVE mg/dL
Hgb urine dipstick: NEGATIVE
Ketones, ur: NEGATIVE mg/dL
Nitrite: NEGATIVE
Protein, ur: 30 mg/dL — AB
Specific Gravity, Urine: 1.029 (ref 1.005–1.030)
pH: 5 (ref 5.0–8.0)

## 2022-12-23 LAB — SURGICAL PCR SCREEN
MRSA, PCR: NEGATIVE
Staphylococcus aureus: NEGATIVE

## 2022-12-23 LAB — PROTIME-INR
INR: 1 (ref 0.8–1.2)
Prothrombin Time: 12.7 seconds (ref 11.4–15.2)

## 2022-12-23 LAB — BLOOD GAS, ARTERIAL
Acid-Base Excess: 0.5 mmol/L (ref 0.0–2.0)
Bicarbonate: 24.6 mmol/L (ref 20.0–28.0)
Drawn by: 1353
O2 Saturation: 98.9 %
Patient temperature: 37
pCO2 arterial: 37 mmHg (ref 32–48)
pH, Arterial: 7.43 (ref 7.35–7.45)
pO2, Arterial: 104 mmHg (ref 83–108)

## 2022-12-23 LAB — APTT: aPTT: 31 seconds (ref 24–36)

## 2022-12-23 NOTE — Progress Notes (Addendum)
PCP - Lucky Cowboy Cardiologist - denies  PPM/ICD - denies   Chest x-ray - 12/23/22 EKG - 12/23/22 Stress Test - denies ECHO - denies Cardiac Cath - denies  Sleep Study - denies    ERAS Protcol -no  COVID TEST- 12/23/22   Anesthesia review: yes,. Review labs  Patient denies shortness of breath, fever, cough and chest pain at PAT appointment   All instructions explained to the patient, with a verbal understanding of the material. Patient agrees to go over the instructions while at home for a better understanding. Patient also instructed to self quarantine after being tested for COVID-19. The opportunity to ask questions was provided.

## 2022-12-23 NOTE — Progress Notes (Signed)
Surgical Instructions   Your procedure is scheduled on Wednesday, January 17 at 8:30 AM.  Report to [facility] Main Entrance "A" at 6:30 AM, then check in with the Admitting office.  Call this number if you have problems the morning of surgery:  [phone]   If you have any questions prior to your surgery date call [phone]: Open Monday-Friday 8am-4pm If you experience any cold or flu symptoms such as cough, fever, chills, shortness of breath, etc. between now and your scheduled surgery, please notify us at the above number    Remember:  Do not eat or drink after midnight the night before your surgery.    Take these medicines the morning of surgery - you may use your eye drops the morning of surgery.  As of today, STOP taking any Aspirin (unless otherwise instructed by your surgeon) Aleve, Naproxen, Ibuprofen, Motrin, Advil, Goody's, BC's, all herbal medications, fish oil and all vitamins.       Do not wear jewelry or makeup. Do not wear lotions, powders, perfumes or deodorant. Do not shave 48 hours prior to surgery.   Do not bring valuables to the hospital. Do not wear nail polish, gel polish, artificial nails, or any other type of covering on natural nails (fingers and toes) If you have artificial nails or gel coating that need to be removed by a nail salon, please have this removed prior to surgery. Artificial nails or gel coating may interfere with anesthesia's ability to adequately monitor your vital signs.  [facility] is not responsible for any belongings or valuables.     Contacts, glasses, hearing aids, dentures or partials may not be worn into surgery, please bring cases for these belongings   For patients admitted to the hospital, discharge time will be determined by your treatment team.  SURGICAL WAITING ROOM VISITATION Patients having surgery or a procedure may have no more than 2 support people in the waiting area - these visitors may rotate.   Children under the  age of 74 must have an adult with them who is not the patient. If the patient needs to stay at the hospital during part of their recovery, the visitor guidelines for inpatient rooms apply. Pre-op nurse will coordinate an appropriate time for 1 support person to accompany patient in pre-op.  This support person may not rotate.   Please refer to [email or web address] for the visitor guidelines for Inpatients (after your surgery is over and you are in a regular room).   Special instructions:    Oral Hygiene is also important to reduce your risk of infection.  Remember - BRUSH YOUR TEETH THE MORNING OF SURGERY WITH YOUR REGULAR TOOTHPASTE  - Preparing For Surgery  Before surgery, you can play an important role. Because skin is not sterile, your skin needs to be as free of germs as possible. You can reduce the number of germs on your skin by washing with CHG (chlorahexidine gluconate) Soap before surgery.  CHG is an antiseptic cleaner which kills germs and bonds with the skin to continue killing germs even after washing.    Please do not use if you have an allergy to CHG or antibacterial soaps. If your skin becomes reddened/irritated stop using the CHG.  Do not shave (including legs and underarms) for at least 48 hours prior to first CHG shower. It is OK to shave your face.  Please follow these instructions carefully.    Shower the NIGHT BEFORE SURGERY and the MORNING OF SURGERY with  CHG Soap.   If you chose to wash your hair, wash your hair first as usual with your normal shampoo. After you shampoo, rinse your hair and body thoroughly to remove the shampoo.  Then [employer] and genitals (private parts) with your normal soap and rinse thoroughly to remove soap.  After that Use CHG Soap as you would any other liquid soap. You can apply CHG directly to the skin and wash gently with a scrungie or a clean washcloth.   Apply the CHG Soap to your  body ONLY FROM THE NECK DOWN.  Do not use on open wounds or open sores. Avoid contact with your eyes, ears, mouth and genitals (private parts). Wash Face and genitals (private parts)  with your normal soap.   Wash thoroughly, paying special attention to the area where your surgery will be performed.  Thoroughly rinse your body with warm water from the neck down.  DO NOT shower/wash with your normal soap after using and rinsing off the CHG Soap.  Pat yourself dry with a CLEAN TOWEL.  Wear CLEAN PAJAMAS to bed the night before surgery  Place CLEAN SHEETS on your bed the night before your surgery  DO NOT SLEEP WITH PETS.  Day of Surgery: Take a shower with CHG soap. Wear Clean/Comfortable clothing the morning of surgery Do not apply any deodorants/lotions.   Remember to brush your teeth WITH YOUR REGULAR TOOTHPASTE.  If you received a COVID test during your pre-op visit, it is requested that you wear a mask when out in public, stay away from anyone that may not be feeling well, and notify your surgeon if you develop symptoms. If you have been in contact with anyone that has tested positive in the last 10 days, please notify your surgeon.  Please read over the fact sheets that you were given.

## 2022-12-23 NOTE — Progress Notes (Signed)
Surgical Instructions    Your procedure is scheduled on Wednesday, 12/25/22.  Report to [facility] Main Entrance "A" at 6:30 A.M., then check in with the Admitting office.  Call this number if you have problems the morning of surgery:  [phone]   If you have any questions prior to your surgery date call [phone]: Open Monday-Friday 8am-4pm If you experience any cold or flu symptoms such as cough, fever, chills, shortness of breath, etc. between now and your scheduled surgery, please notify us at the above number     Remember:  Do not eat or drink after midnight the night before your surgery     Take these medicines the morning of surgery with A SIP OF WATER:  Propylene Glycol (SYSTANE BALANCE) if needed  As of today, STOP taking any Aspirin (unless otherwise instructed by your surgeon) Aleve, Naproxen, Ibuprofen, Motrin, Advil, Goody's, BC's, all herbal medications, fish oil, and all vitamins.           Do not wear jewelry or makeup. Do not wear lotions, powders, perfumes or deodorant. Do not shave 48 hours prior to surgery.   Do not bring valuables to the hospital. Do not wear nail polish, gel polish, artificial nails, or any other type of covering on natural nails (fingers and toes) If you have artificial nails or gel coating that need to be removed by a nail salon, please have this removed prior to surgery. Artificial nails or gel coating may interfere with anesthesia's ability to adequately monitor your vital signs.  [facility] is not responsible for any belongings or valuables.    Do NOT Smoke (Tobacco/Vaping)  24 hours prior to your procedure  If you use a CPAP at night, you may bring your mask for your overnight stay.   Contacts, glasses, hearing aids, dentures or partials may not be worn into surgery, please bring cases for these belongings   For patients admitted to the hospital, discharge time will be determined by your treatment team.   Patients discharged  the day of surgery will not be allowed to drive home, and someone needs to stay with them for 24 hours.   SURGICAL WAITING ROOM VISITATION Patients having surgery or a procedure may have no more than 2 support people in the waiting area - these visitors may rotate.   Children under the age of 24 must have an adult with them who is not the patient. If the patient needs to stay at the hospital during part of their recovery, the visitor guidelines for inpatient rooms apply. Pre-op nurse will coordinate an appropriate time for 1 support person to accompany patient in pre-op.  This support person may not rotate.   Please refer to [email or web address] for the visitor guidelines for Inpatients (after your surgery is over and you are in a regular room).    Special instructions:    Oral Hygiene is also important to reduce your risk of infection.  Remember - BRUSH YOUR TEETH THE MORNING OF SURGERY WITH YOUR REGULAR TOOTHPASTE   [facility]- Preparing For Surgery  Before surgery, you can play an important role. Because skin is not sterile, your skin needs to be as free of germs as possible. You can reduce the number of germs on your skin by washing with CHG (chlorahexidine gluconate) Soap before surgery.  CHG is an antiseptic cleaner which kills germs and bonds with the skin to continue killing germs even after washing.     Please do not use if you  have an allergy to CHG or antibacterial soaps. If your skin becomes reddened/irritated stop using the CHG.  Do not shave (including legs and underarms) for at least 48 hours prior to first CHG shower. It is OK to shave your face.  Please follow these instructions carefully.     Shower the NIGHT BEFORE SURGERY and the MORNING OF SURGERY with CHG Soap.   If you chose to wash your hair, wash your hair first as usual with your normal shampoo. After you shampoo, rinse your hair and body thoroughly to remove the  shampoo.  Then [employer] and genitals (private parts) with your normal soap and rinse thoroughly to remove soap.  After that Use CHG Soap as you would any other liquid soap. You can apply CHG directly to the skin and wash gently with a scrungie or a clean washcloth.   Apply the CHG Soap to your body ONLY FROM THE NECK DOWN.  Do not use on open wounds or open sores. Avoid contact with your eyes, ears, mouth and genitals (private parts). Wash Face and genitals (private parts)  with your normal soap.   Wash thoroughly, paying special attention to the area where your surgery will be performed.  Thoroughly rinse your body with warm water from the neck down.  DO NOT shower/wash with your normal soap after using and rinsing off the CHG Soap.  Pat yourself dry with a CLEAN TOWEL.  Wear CLEAN PAJAMAS to bed the night before surgery  Place CLEAN SHEETS on your bed the night before your surgery  DO NOT SLEEP WITH PETS.   Day of Surgery: Take a shower with CHG soap. Wear Clean/Comfortable clothing the morning of surgery Do not apply any deodorants/lotions.   Remember to brush your teeth WITH YOUR REGULAR TOOTHPASTE.    If you received a COVID test during your pre-op visit, it is requested that you wear a mask when out in public, stay away from anyone that may not be feeling well, and notify your surgeon if you develop symptoms. If you have been in contact with anyone that has tested positive in the last 10 days, please notify your surgeon.    Please read over the following fact sheets that you were given.

## 2022-12-24 LAB — SARS CORONAVIRUS 2 (TAT 6-24 HRS): SARS Coronavirus 2: NEGATIVE

## 2022-12-24 NOTE — Anesthesia Preprocedure Evaluation (Addendum)
Anesthesia Evaluation  Patient identified by MRN, date of birth, ID band Patient awake    Reviewed: Allergy & Precautions, NPO status , Patient's Chart, lab work & pertinent test results  Airway Mallampati: II  TM Distance: >3 FB Neck ROM: Full    Dental no notable dental hx.    Pulmonary COPD   Pulmonary exam normal        Cardiovascular hypertension, Pt. on home beta blockers Normal cardiovascular exam     Neuro/Psych  PSYCHIATRIC DISORDERS Anxiety     OCDnegative neurological ROS     GI/Hepatic negative GI ROS, Neg liver ROS,,,  Endo/Other  negative endocrine ROS    Renal/GU negative Renal ROS     Musculoskeletal negative musculoskeletal ROS (+)    Abdominal   Peds  Hematology negative hematology ROS (+)   Anesthesia Other Findings RUL nodule  Reproductive/Obstetrics                             Anesthesia Physical Anesthesia Plan  ASA: 3  Anesthesia Plan: General   Post-op Pain Management:    Induction: Intravenous  PONV Risk Score and Plan: 3 and Ondansetron, Dexamethasone, Midazolam and Treatment may vary due to age or medical condition  Airway Management Planned: Oral ETT and Double Lumen EBT  Additional Equipment: Arterial line  Intra-op Plan:   Post-operative Plan: Extubation in OR  Informed Consent: I have reviewed the patients History and Physical, chart, labs and discussed the procedure including the risks, benefits and alternatives for the proposed anesthesia with the patient or authorized representative who has indicated his/her understanding and acceptance.     Dental advisory given  Plan Discussed with: CRNA  Anesthesia Plan Comments:        Anesthesia Quick Evaluation

## 2022-12-25 ENCOUNTER — Inpatient Hospital Stay (HOSPITAL_COMMUNITY): Payer: Medicare PPO | Admitting: Anesthesiology

## 2022-12-25 ENCOUNTER — Inpatient Hospital Stay (HOSPITAL_COMMUNITY)
Admission: RE | Admit: 2022-12-25 | Discharge: 2022-12-27 | DRG: 164 | Disposition: A | Discharge status: Home / Self Care | Payer: Medicare PPO | Attending: Thoracic Surgery (Cardiothoracic Vascular Surgery) | Admitting: Thoracic Surgery (Cardiothoracic Vascular Surgery)

## 2022-12-25 ENCOUNTER — Inpatient Hospital Stay (HOSPITAL_COMMUNITY): Payer: Medicare PPO

## 2022-12-25 ENCOUNTER — Encounter (HOSPITAL_COMMUNITY): Payer: Self-pay | Admitting: Student

## 2022-12-25 ENCOUNTER — Encounter (HOSPITAL_COMMUNITY)
Admission: RE | Disposition: A | Discharge status: Home / Self Care | Payer: Self-pay | Source: Home / Self Care | Attending: Thoracic Surgery (Cardiothoracic Vascular Surgery)

## 2022-12-25 ENCOUNTER — Inpatient Hospital Stay (HOSPITAL_COMMUNITY): Payer: Medicare PPO | Admitting: Physician Assistant

## 2022-12-25 ENCOUNTER — Other Ambulatory Visit: Payer: Self-pay

## 2022-12-25 DIAGNOSIS — J9571 Accidental puncture and laceration of a respiratory system organ or structure during a respiratory system procedure: Secondary | ICD-10-CM | POA: Diagnosis not present

## 2022-12-25 DIAGNOSIS — E871 Hypo-osmolality and hyponatremia: Secondary | ICD-10-CM | POA: Diagnosis not present

## 2022-12-25 DIAGNOSIS — R490 Dysphonia: Secondary | ICD-10-CM | POA: Diagnosis not present

## 2022-12-25 DIAGNOSIS — Z79899 Other long term (current) drug therapy: Secondary | ICD-10-CM | POA: Diagnosis not present

## 2022-12-25 DIAGNOSIS — J439 Emphysema, unspecified: Secondary | ICD-10-CM | POA: Diagnosis present

## 2022-12-25 DIAGNOSIS — Z8051 Family history of malignant neoplasm of kidney: Secondary | ICD-10-CM

## 2022-12-25 DIAGNOSIS — D62 Acute posthemorrhagic anemia: Secondary | ICD-10-CM | POA: Diagnosis not present

## 2022-12-25 DIAGNOSIS — Z8249 Family history of ischemic heart disease and other diseases of the circulatory system: Secondary | ICD-10-CM

## 2022-12-25 DIAGNOSIS — Z1152 Encounter for screening for COVID-19: Secondary | ICD-10-CM | POA: Diagnosis not present

## 2022-12-25 DIAGNOSIS — R9431 Abnormal electrocardiogram [ECG] [EKG]: Secondary | ICD-10-CM | POA: Diagnosis present

## 2022-12-25 DIAGNOSIS — K219 Gastro-esophageal reflux disease without esophagitis: Secondary | ICD-10-CM | POA: Diagnosis present

## 2022-12-25 DIAGNOSIS — J9383 Other pneumothorax: Secondary | ICD-10-CM | POA: Diagnosis not present

## 2022-12-25 DIAGNOSIS — K449 Diaphragmatic hernia without obstruction or gangrene: Secondary | ICD-10-CM | POA: Diagnosis present

## 2022-12-25 DIAGNOSIS — I700 Atherosclerosis of aorta: Secondary | ICD-10-CM | POA: Diagnosis present

## 2022-12-25 DIAGNOSIS — Z01818 Encounter for other preprocedural examination: Secondary | ICD-10-CM

## 2022-12-25 DIAGNOSIS — F419 Anxiety disorder, unspecified: Secondary | ICD-10-CM | POA: Diagnosis not present

## 2022-12-25 DIAGNOSIS — R7303 Prediabetes: Secondary | ICD-10-CM | POA: Diagnosis present

## 2022-12-25 DIAGNOSIS — Z800 Family history of malignant neoplasm of digestive organs: Secondary | ICD-10-CM | POA: Diagnosis not present

## 2022-12-25 DIAGNOSIS — R911 Solitary pulmonary nodule: Secondary | ICD-10-CM | POA: Diagnosis present

## 2022-12-25 DIAGNOSIS — C3411 Malignant neoplasm of upper lobe, right bronchus or lung: Secondary | ICD-10-CM | POA: Diagnosis not present

## 2022-12-25 DIAGNOSIS — J9811 Atelectasis: Secondary | ICD-10-CM | POA: Diagnosis not present

## 2022-12-25 DIAGNOSIS — I10 Essential (primary) hypertension: Secondary | ICD-10-CM | POA: Diagnosis not present

## 2022-12-25 DIAGNOSIS — Z8719 Personal history of other diseases of the digestive system: Secondary | ICD-10-CM

## 2022-12-25 DIAGNOSIS — J449 Chronic obstructive pulmonary disease, unspecified: Secondary | ICD-10-CM

## 2022-12-25 DIAGNOSIS — Z825 Family history of asthma and other chronic lower respiratory diseases: Secondary | ICD-10-CM | POA: Diagnosis not present

## 2022-12-25 DIAGNOSIS — Z902 Acquired absence of lung [part of]: Secondary | ICD-10-CM

## 2022-12-25 DIAGNOSIS — E785 Hyperlipidemia, unspecified: Secondary | ICD-10-CM | POA: Diagnosis present

## 2022-12-25 DIAGNOSIS — R918 Other nonspecific abnormal finding of lung field: Secondary | ICD-10-CM | POA: Diagnosis not present

## 2022-12-25 DIAGNOSIS — Z7722 Contact with and (suspected) exposure to environmental tobacco smoke (acute) (chronic): Secondary | ICD-10-CM | POA: Diagnosis present

## 2022-12-25 HISTORY — PX: NODE DISSECTION: SHX5269

## 2022-12-25 HISTORY — PX: FIDUCIAL MARKER PLACEMENT: SHX6858

## 2022-12-25 HISTORY — PX: INTERCOSTAL NERVE BLOCK: SHX5021

## 2022-12-25 HISTORY — PX: BRONCHIAL NEEDLE ASPIRATION BIOPSY: SHX5106

## 2022-12-25 LAB — ABO/RH: ABO/RH(D): A POS

## 2022-12-25 SURGERY — BRONCHOSCOPY, WITH BIOPSY USING ELECTROMAGNETIC NAVIGATION
Anesthesia: General

## 2022-12-25 SURGERY — WEDGE RESECTION, LUNG, ROBOT-ASSISTED, THORACOSCOPIC
Anesthesia: General | Site: Chest | Laterality: Right

## 2022-12-25 MED ORDER — HEMOSTATIC AGENTS (NO CHARGE) OPTIME
TOPICAL | Status: DC | PRN
  Administered 2022-12-25: 1

## 2022-12-25 MED ORDER — FENTANYL CITRATE (PF) 100 MCG/2ML IJ SOLN
25.0000 ug | INTRAMUSCULAR | Status: DC | PRN
  Administered 2022-12-25: 50 ug via INTRAVENOUS

## 2022-12-25 MED ORDER — GABAPENTIN 300 MG PO CAPS: 300.0000 mg | ORAL_CAPSULE | Freq: Two times a day (BID) | ORAL | Status: DC

## 2022-12-25 MED ORDER — CHLORHEXIDINE GLUCONATE 0.12 % MT SOLN
OROMUCOSAL | Status: AC
  Filled 2022-12-25: qty 15

## 2022-12-25 MED ORDER — SODIUM CHLORIDE FLUSH 0.9 % IV SOLN
INTRAVENOUS | Status: DC | PRN
  Administered 2022-12-25: 100 mL

## 2022-12-25 MED ORDER — KETOROLAC TROMETHAMINE 15 MG/ML IJ SOLN
15.0000 mg | Freq: Four times a day (QID) | INTRAMUSCULAR | Status: AC
  Administered 2022-12-25 – 2022-12-27 (×7): 15 mg via INTRAVENOUS
  Filled 2022-12-25 (×7): qty 1

## 2022-12-25 MED ORDER — OXYCODONE HCL 5 MG PO TABS: 5.0000 mg | ORAL_TABLET | ORAL | Status: DC | PRN

## 2022-12-25 MED ORDER — CLOMIPRAMINE HCL 25 MG PO CAPS
50.0000 mg | ORAL_CAPSULE | Freq: Every day | ORAL | Status: DC
  Administered 2022-12-25 – 2022-12-26 (×2): 50 mg via ORAL
  Filled 2022-12-25 (×3): qty 2

## 2022-12-25 MED ORDER — FENTANYL CITRATE (PF) 250 MCG/5ML IJ SOLN
INTRAMUSCULAR | Status: AC
  Filled 2022-12-25: qty 5

## 2022-12-25 MED ORDER — SODIUM CHLORIDE 0.45 % IV SOLN: INTRAVENOUS | Status: DC

## 2022-12-25 MED ORDER — ONDANSETRON HCL 4 MG/2ML IJ SOLN: 4.0000 mg | Freq: Once | INTRAMUSCULAR | Status: DC | PRN

## 2022-12-25 MED ORDER — PHENYLEPHRINE HCL-NACL 20-0.9 MG/250ML-% IV SOLN
INTRAVENOUS | Status: DC | PRN
  Administered 2022-12-25: 20 ug/min via INTRAVENOUS

## 2022-12-25 MED ORDER — LACTATED RINGERS IV SOLN: INTRAVENOUS | Status: DC

## 2022-12-25 MED ORDER — POLYVINYL ALCOHOL 1.4 % OP SOLN
1.0000 [drp] | OPHTHALMIC | Status: DC | PRN
  Filled 2022-12-25: qty 15

## 2022-12-25 MED ORDER — MIDAZOLAM HCL 5 MG/5ML IJ SOLN
INTRAMUSCULAR | Status: DC | PRN
  Administered 2022-12-25 (×2): 1 mg via INTRAVENOUS

## 2022-12-25 MED ORDER — CALCIUM CARBONATE ANTACID 500 MG PO CHEW
1.0000 | CHEWABLE_TABLET | Freq: Once | ORAL | Status: AC
  Administered 2022-12-25: 200 mg via ORAL
  Filled 2022-12-25: qty 1

## 2022-12-25 MED ORDER — ATENOLOL 25 MG PO TABS
25.0000 mg | ORAL_TABLET | Freq: Every day | ORAL | Status: DC
  Filled 2022-12-25: qty 1

## 2022-12-25 MED ORDER — ACETAMINOPHEN 500 MG PO TABS
1000.0000 mg | ORAL_TABLET | Freq: Once | ORAL | Status: AC
  Administered 2022-12-25: 1000 mg via ORAL

## 2022-12-25 MED ORDER — GABAPENTIN 300 MG PO CAPS
300.0000 mg | ORAL_CAPSULE | Freq: Every day | ORAL | Status: DC
  Administered 2022-12-25 – 2022-12-26 (×2): 300 mg via ORAL
  Filled 2022-12-25 (×2): qty 1

## 2022-12-25 MED ORDER — VANCOMYCIN HCL IN DEXTROSE 1-5 GM/200ML-% IV SOLN
1000.0000 mg | Freq: Two times a day (BID) | INTRAVENOUS | Status: AC
  Administered 2022-12-25: 1000 mg via INTRAVENOUS
  Filled 2022-12-25: qty 200

## 2022-12-25 MED ORDER — FENTANYL CITRATE (PF) 100 MCG/2ML IJ SOLN
INTRAMUSCULAR | Status: AC
  Filled 2022-12-25: qty 2

## 2022-12-25 MED ORDER — LUNG SURGERY BOOK
Freq: Once | Status: AC
  Filled 2022-12-25: qty 1

## 2022-12-25 MED ORDER — 0.9 % SODIUM CHLORIDE (POUR BTL) OPTIME
TOPICAL | Status: DC | PRN
  Administered 2022-12-25: 2000 mL

## 2022-12-25 MED ORDER — ONDANSETRON HCL 4 MG/2ML IJ SOLN
INTRAMUSCULAR | Status: DC | PRN
  Administered 2022-12-25 (×2): 4 mg via INTRAVENOUS

## 2022-12-25 MED ORDER — EPHEDRINE SULFATE-NACL 50-0.9 MG/10ML-% IV SOSY
PREFILLED_SYRINGE | INTRAVENOUS | Status: DC | PRN
  Administered 2022-12-25: 5 mg via INTRAVENOUS
  Administered 2022-12-25 (×2): 10 mg via INTRAVENOUS
  Administered 2022-12-25: 5 mg via INTRAVENOUS

## 2022-12-25 MED ORDER — SENNOSIDES-DOCUSATE SODIUM 8.6-50 MG PO TABS
1.0000 | ORAL_TABLET | Freq: Every day | ORAL | Status: DC
  Administered 2022-12-25: 1 via ORAL
  Filled 2022-12-25: qty 1

## 2022-12-25 MED ORDER — ACETAMINOPHEN 500 MG PO TABS
1000.0000 mg | ORAL_TABLET | Freq: Four times a day (QID) | ORAL | Status: DC
  Administered 2022-12-25 – 2022-12-27 (×7): 1000 mg via ORAL
  Filled 2022-12-25 (×7): qty 2

## 2022-12-25 MED ORDER — AMISULPRIDE (ANTIEMETIC) 5 MG/2ML IV SOLN: 10.0000 mg | Freq: Once | INTRAVENOUS | Status: DC | PRN

## 2022-12-25 MED ORDER — LIDOCAINE 2% (20 MG/ML) 5 ML SYRINGE
INTRAMUSCULAR | Status: DC | PRN
  Administered 2022-12-25: 60 mg via INTRAVENOUS

## 2022-12-25 MED ORDER — CLONAZEPAM 0.5 MG PO TABS
1.5000 mg | ORAL_TABLET | Freq: Every day | ORAL | Status: DC
  Administered 2022-12-25 – 2022-12-26 (×2): 1.5 mg via ORAL
  Filled 2022-12-25 (×2): qty 3

## 2022-12-25 MED ORDER — LACTATED RINGERS IV SOLN: INTRAVENOUS | Status: DC | PRN

## 2022-12-25 MED ORDER — VANCOMYCIN HCL IN DEXTROSE 1-5 GM/200ML-% IV SOLN
INTRAVENOUS | Status: AC
  Filled 2022-12-25: qty 200

## 2022-12-25 MED ORDER — SODIUM CHLORIDE 0.9 % IV SOLN
INTRAVENOUS | Status: AC | PRN
  Administered 2022-12-25: 1000 mL

## 2022-12-25 MED ORDER — ACETAMINOPHEN 500 MG PO TABS
ORAL_TABLET | ORAL | Status: AC
  Filled 2022-12-25: qty 2

## 2022-12-25 MED ORDER — PROPOFOL 10 MG/ML IV BOLUS
INTRAVENOUS | Status: DC | PRN
  Administered 2022-12-25: 100 mg via INTRAVENOUS
  Administered 2022-12-25: 50 mg via INTRAVENOUS
  Administered 2022-12-25: 60 mg via INTRAVENOUS

## 2022-12-25 MED ORDER — PROPOFOL 10 MG/ML IV BOLUS
INTRAVENOUS | Status: AC
  Filled 2022-12-25: qty 20

## 2022-12-25 MED ORDER — ACETAMINOPHEN 160 MG/5ML PO SOLN: 1000.0000 mg | Freq: Four times a day (QID) | ORAL | Status: DC

## 2022-12-25 MED ORDER — ROCURONIUM BROMIDE 10 MG/ML (PF) SYRINGE
PREFILLED_SYRINGE | INTRAVENOUS | Status: DC | PRN
  Administered 2022-12-25: 40 mg via INTRAVENOUS
  Administered 2022-12-25: 60 mg via INTRAVENOUS
  Administered 2022-12-25: 20 mg via INTRAVENOUS

## 2022-12-25 MED ORDER — PANTOPRAZOLE SODIUM 40 MG PO TBEC
40.0000 mg | DELAYED_RELEASE_TABLET | Freq: Every day | ORAL | Status: DC
  Administered 2022-12-26 – 2022-12-27 (×2): 40 mg via ORAL
  Filled 2022-12-25 (×3): qty 1

## 2022-12-25 MED ORDER — ORAL CARE MOUTH RINSE: 15.0000 mL | Freq: Once | OROMUCOSAL | Status: AC

## 2022-12-25 MED ORDER — MIDAZOLAM HCL 2 MG/2ML IJ SOLN
INTRAMUSCULAR | Status: AC
  Filled 2022-12-25: qty 2

## 2022-12-25 MED ORDER — VANCOMYCIN HCL IN DEXTROSE 1-5 GM/200ML-% IV SOLN
1000.0000 mg | INTRAVENOUS | Status: AC
  Administered 2022-12-25: 1000 mg via INTRAVENOUS

## 2022-12-25 MED ORDER — PHENYLEPHRINE 80 MCG/ML (10ML) SYRINGE FOR IV PUSH (FOR BLOOD PRESSURE SUPPORT)
PREFILLED_SYRINGE | INTRAVENOUS | Status: DC | PRN
  Administered 2022-12-25: 240 ug via INTRAVENOUS
  Administered 2022-12-25: 160 ug via INTRAVENOUS
  Administered 2022-12-25 (×2): 80 ug via INTRAVENOUS
  Administered 2022-12-25 (×3): 160 ug via INTRAVENOUS
  Administered 2022-12-25: 80 ug via INTRAVENOUS

## 2022-12-25 MED ORDER — FENTANYL CITRATE PF 50 MCG/ML IJ SOSY: 25.0000 ug | PREFILLED_SYRINGE | INTRAMUSCULAR | Status: DC | PRN

## 2022-12-25 MED ORDER — CHLORHEXIDINE GLUCONATE 0.12 % MT SOLN
15.0000 mL | Freq: Once | OROMUCOSAL | Status: AC
  Administered 2022-12-25: 15 mL via OROMUCOSAL

## 2022-12-25 MED ORDER — ONDANSETRON HCL 4 MG/2ML IJ SOLN: 4.0000 mg | Freq: Four times a day (QID) | INTRAMUSCULAR | Status: DC | PRN

## 2022-12-25 MED ORDER — BISACODYL 5 MG PO TBEC
10.0000 mg | DELAYED_RELEASE_TABLET | Freq: Every day | ORAL | Status: DC
  Administered 2022-12-26: 10 mg via ORAL
  Filled 2022-12-25: qty 2

## 2022-12-25 MED ORDER — SUGAMMADEX SODIUM 200 MG/2ML IV SOLN
INTRAVENOUS | Status: DC | PRN
  Administered 2022-12-25: 200 mg via INTRAVENOUS

## 2022-12-25 MED ORDER — ENOXAPARIN SODIUM 40 MG/0.4ML IJ SOSY
40.0000 mg | PREFILLED_SYRINGE | Freq: Every day | INTRAMUSCULAR | Status: DC
  Administered 2022-12-26 – 2022-12-27 (×2): 40 mg via SUBCUTANEOUS
  Filled 2022-12-25 (×2): qty 0.4

## 2022-12-25 MED ORDER — CHLORHEXIDINE GLUCONATE CLOTH 2 % EX PADS
6.0000 | MEDICATED_PAD | Freq: Every day | CUTANEOUS | Status: DC
  Administered 2022-12-25 – 2022-12-26 (×2): 6 via TOPICAL

## 2022-12-25 MED ORDER — FENTANYL CITRATE (PF) 250 MCG/5ML IJ SOLN
INTRAMUSCULAR | Status: DC | PRN
  Administered 2022-12-25: 150 ug via INTRAVENOUS

## 2022-12-25 MED ORDER — DEXAMETHASONE SODIUM PHOSPHATE 10 MG/ML IJ SOLN
INTRAMUSCULAR | Status: DC | PRN
  Administered 2022-12-25: 10 mg via INTRAVENOUS

## 2022-12-25 SURGICAL SUPPLY — 95 items
ADH SKN CLS APL DERMABOND .7 (GAUZE/BANDAGES/DRESSINGS) ×1
APPLIER CLIP ROT 10 11.4 M/L (STAPLE)
APR CLP MED LRG 11.4X10 (STAPLE)
BAG SPEC RTRVL C125 8X14 (MISCELLANEOUS) ×1
BLADE CLIPPER SURG (BLADE) ×2 IMPLANT
CANISTER SUCT 3000ML PPV (MISCELLANEOUS) ×4 IMPLANT
CANNULA REDUC XI 12-8 STAPL (CANNULA) ×2
CANNULA REDUCER 12-8 DVNC XI (CANNULA) ×4 IMPLANT
CLIP APPLIE ROT 10 11.4 M/L (STAPLE) IMPLANT
CNTNR URN SCR LID CUP LEK RST (MISCELLANEOUS) ×10 IMPLANT
CONN ST 1/4X3/8  BEN (MISCELLANEOUS)
CONN ST 1/4X3/8 BEN (MISCELLANEOUS) IMPLANT
CONT SPEC 4OZ STRL OR WHT (MISCELLANEOUS) ×5
DEFOGGER SCOPE WARMER CLEARIFY (MISCELLANEOUS) ×2 IMPLANT
DERMABOND ADVANCED .7 DNX12 (GAUZE/BANDAGES/DRESSINGS) ×2 IMPLANT
DRAIN CHANNEL 28F RND 3/8 FF (WOUND CARE) IMPLANT
DRAIN CHANNEL 32F RND 10.7 FF (WOUND CARE) IMPLANT
DRAPE ARM DVNC X/XI (DISPOSABLE) ×8 IMPLANT
DRAPE COLUMN DVNC XI (DISPOSABLE) ×2 IMPLANT
DRAPE CV SPLIT W-CLR ANES SCRN (DRAPES) ×2 IMPLANT
DRAPE DA VINCI XI ARM (DISPOSABLE) ×4
DRAPE DA VINCI XI COLUMN (DISPOSABLE) ×1
DRAPE HALF SHEET 40X57 (DRAPES) ×2 IMPLANT
DRAPE INCISE IOBAN 66X45 STRL (DRAPES) IMPLANT
DRAPE ORTHO SPLIT 77X108 STRL (DRAPES) ×1
DRAPE SURG ORHT 6 SPLT 77X108 (DRAPES) ×2 IMPLANT
ELECT BLADE 6.5 EXT (BLADE) IMPLANT
ELECT REM PT RETURN 9FT ADLT (ELECTROSURGICAL) ×1
ELECTRODE REM PT RTRN 9FT ADLT (ELECTROSURGICAL) ×2 IMPLANT
GAUZE KITTNER 4X5 RF (MISCELLANEOUS) ×4 IMPLANT
GAUZE SPONGE 4X4 12PLY STRL (GAUZE/BANDAGES/DRESSINGS) ×2 IMPLANT
GLOVE SS BIOGEL STRL SZ 7.5 (GLOVE) ×2 IMPLANT
GOWN STRL REUS W/ TWL LRG LVL3 (GOWN DISPOSABLE) ×4 IMPLANT
GOWN STRL REUS W/ TWL XL LVL3 (GOWN DISPOSABLE) ×4 IMPLANT
GOWN STRL REUS W/TWL 2XL LVL3 (GOWN DISPOSABLE) ×2 IMPLANT
GOWN STRL REUS W/TWL LRG LVL3 (GOWN DISPOSABLE) ×2
GOWN STRL REUS W/TWL XL LVL3 (GOWN DISPOSABLE) ×2
HEMOSTAT SURGICEL 2X14 (HEMOSTASIS) ×6 IMPLANT
IRRIGATION STRYKERFLOW (MISCELLANEOUS) ×2 IMPLANT
IRRIGATOR STRYKERFLOW (MISCELLANEOUS) ×1
KIT BASIN OR (CUSTOM PROCEDURE TRAY) ×2 IMPLANT
KIT SUCTION CATH 14FR (SUCTIONS) IMPLANT
KIT TURNOVER KIT B (KITS) ×2 IMPLANT
NDL HYPO 25GX1X1/2 BEV (NEEDLE) ×2 IMPLANT
NDL SPNL 22GX3.5 QUINCKE BK (NEEDLE) ×2 IMPLANT
NEEDLE HYPO 25GX1X1/2 BEV (NEEDLE) ×1 IMPLANT
NEEDLE SPNL 22GX3.5 QUINCKE BK (NEEDLE) ×1 IMPLANT
NS IRRIG 1000ML POUR BTL (IV SOLUTION) ×2 IMPLANT
PACK CHEST (CUSTOM PROCEDURE TRAY) ×2 IMPLANT
PAD ARMBOARD 7.5X6 YLW CONV (MISCELLANEOUS) ×4 IMPLANT
PORT ACCESS TROCAR AIRSEAL 12 (TROCAR) ×2 IMPLANT
RELOAD STAPLE 45 3.5 BLU DVNC (STAPLE) IMPLANT
RELOAD STAPLE 45 4.3 GRN DVNC (STAPLE) IMPLANT
RELOAD STAPLE 45 4.6 BLK DVNC (STAPLE) IMPLANT
RELOAD STAPLER 3.5X45 BLU DVNC (STAPLE) ×4 IMPLANT
RELOAD STAPLER 4.3X45 GRN DVNC (STAPLE) ×2 IMPLANT
RELOAD STAPLER 45 4.6 BLK DVNC (STAPLE) ×1 IMPLANT
SCISSORS LAP 5X35 DISP (ENDOMECHANICALS) IMPLANT
SEAL CANN UNIV 5-8 DVNC XI (MISCELLANEOUS) ×4 IMPLANT
SEAL XI 5MM-8MM UNIVERSAL (MISCELLANEOUS) ×2
SET TRI-LUMEN FLTR TB AIRSEAL (TUBING) ×2 IMPLANT
SOLUTION ELECTROLUBE (MISCELLANEOUS) ×2 IMPLANT
SPONGE TONSIL TAPE 1 RFD (DISPOSABLE) IMPLANT
STAPLER 45 DA VINCI SURE FORM (STAPLE) ×1
STAPLER 45 SUREFORM DVNC (STAPLE) IMPLANT
STAPLER CANNULA SEAL DVNC XI (STAPLE) ×4 IMPLANT
STAPLER CANNULA SEAL XI (STAPLE) ×2
STAPLER RELOAD 3.5X45 BLU DVNC (STAPLE) ×4
STAPLER RELOAD 3.5X45 BLUE (STAPLE) ×4
STAPLER RELOAD 4.3X45 GREEN (STAPLE) ×2
STAPLER RELOAD 4.3X45 GRN DVNC (STAPLE) ×2
STAPLER RELOAD 45 4.6 BLK (STAPLE) ×1
STAPLER RELOAD 45 4.6 BLK DVNC (STAPLE) ×1
SUT PDS AB 3-0 SH 27 (SUTURE) IMPLANT
SUT PROLENE 4 0 RB 1 (SUTURE)
SUT PROLENE 4-0 RB1 .5 CRCL 36 (SUTURE) IMPLANT
SUT SILK  1 MH (SUTURE) ×1
SUT SILK 1 MH (SUTURE) ×4 IMPLANT
SUT SILK 2 0 SH (SUTURE) IMPLANT
SUT SILK 2 0SH CR/8 30 (SUTURE) IMPLANT
SUT SILK 3 0SH CR/8 30 (SUTURE) IMPLANT
SUT VIC AB 1 CTX 36 (SUTURE) ×1
SUT VIC AB 1 CTX36XBRD ANBCTR (SUTURE) IMPLANT
SUT VIC AB 2-0 CTX 36 (SUTURE) IMPLANT
SUT VIC AB 3-0 X1 27 (SUTURE) ×4 IMPLANT
SUT VICRYL 0 TIES 12 18 (SUTURE) ×2 IMPLANT
SUT VICRYL 0 UR6 27IN ABS (SUTURE) ×4 IMPLANT
SYR 20ML LL LF (SYRINGE) ×4 IMPLANT
SYSTEM RETRIEVAL ANCHOR 8 (MISCELLANEOUS) IMPLANT
SYSTEM SAHARA CHEST DRAIN ATS (WOUND CARE) ×2 IMPLANT
TAPE CLOTH 4X10 WHT NS (GAUZE/BANDAGES/DRESSINGS) ×2 IMPLANT
TAPE CLOTH SURG 4X10 WHT LF (GAUZE/BANDAGES/DRESSINGS) IMPLANT
TIP APPLICATOR SPRAY EXTEND 16 (VASCULAR PRODUCTS) IMPLANT
TOWEL GREEN STERILE (TOWEL DISPOSABLE) ×4 IMPLANT
WATER STERILE IRR 1000ML POUR (IV SOLUTION) ×2 IMPLANT

## 2022-12-25 NOTE — Hospital Course (Addendum)
HPI: This is a 77 year old woman with a history of anxiety, reflux, esophageal stricture, hyperlipidemia, prediabetes, and palpitations.  In July she had an episode of shortness of breath with palpitations and pain across her back.  She thought she might be having a heart attack.  She had a workup which included a CT angiogram.  It showed a 9 mm.  Groundglass opacity in the right upper lobe.    She is a lifelong non-smoker.  She does complain of some shortness of breath with exertion and that is associated with a rapid heart rate.  She has had some cardiac workup in the past but Dr. Dorris Fetch not find anything other than an EKG in our system.  She saw Dr. Thora Lance.  A repeat CT in November showed the GGO was essentially unchanged.  There was no solid component. Right upper lobe lung nodule- pure ground glass opacity with no solid component.  Infectious and inflammatory nodules as well as low-grade adenocarcinoma are in the differential diagnosis.  If adenocarcinoma this would be a Tis or T1, N0, stage Ia.   Dr. Dorris Fetch discussed several options.  One would be to continue with radiographic follow-up.  That would certainly be acceptable in her case.  The second option would be to do a robotic navigational bronchoscopy for biopsy.  Dr. Dorris Fetch discussed the issues regarding a negative result does not necessarily rule out the possibility of a cancer and she would still need continued follow-up.  The third and most aggressive option would be to resect the nodule.  The nodule would need to be marked with a robotic navigational bronchoscopy first. Pros and cons of the aforementioned were discussed with patient.  Patient wishes to proceed with surgical resection. Potential risks, benefits, and complications of the surgery were discussed with the patient.    Hospital Course: Patient underwent an Xi robotic assisted right VATS, wedge RUL, LN dissection, and intercostal nerve block. She was extubated and  transferred from the OR to PACU in stable condition. A line and foley were removed early in her post op course. Chest tube was to water seal, there was no air leak, and there was minimal drainage. CXR 01/18 showed trace right apical pneumothorax. Chest tube was removed on 01/18. Follow up CXR showed small right apical pneumothorax (no greater than 10% volume), small left pleural effusion and atelectasis. She has been tolerating a diet. All wounds are clean, dry, healing without signs of infection. She is on Lovenox for DVT prophylaxis. PA.LAT CXR 01/19 showed decrease in right apical pneumothorax, chronic blunting left costophrenic angle. She is surgically stable for discharge today.

## 2022-12-25 NOTE — Anesthesia Procedure Notes (Addendum)
Procedure Name: Intubation Date/Time: 12/25/2022 8:45 AM  Performed by: Kriste Basque, RNPre-anesthesia Checklist: Patient identified, Emergency Drugs available, Suction available and Patient being monitored Patient Re-evaluated:Patient Re-evaluated prior to induction Oxygen Delivery Method: Circle System Utilized Preoxygenation: Pre-oxygenation with 100% oxygen Induction Type: IV induction Ventilation: Mask ventilation without difficulty Laryngoscope Size: Mac and 3 Grade View: Grade I Tube type: Oral Tube size: 8.5 mm Number of attempts: 1 Airway Equipment and Method: Stylet Placement Confirmation: ETT inserted through vocal cords under direct vision, positive ETCO2 and breath sounds checked- equal and bilateral Secured at: 22 cm Tube secured with: Tape Dental Injury: Teeth and Oropharynx as per pre-operative assessment

## 2022-12-25 NOTE — Plan of Care (Signed)
  Problem: Clinical Measurements: Goal: Postoperative complications will be avoided or minimized Outcome: Progressing   Problem: Pain Management: Goal: Pain level will decrease Outcome: Progressing   Problem: Nutrition: Goal: Adequate nutrition will be maintained Outcome: Progressing   Problem: Pain Managment: Goal: General experience of comfort will improve Outcome: Progressing

## 2022-12-25 NOTE — Anesthesia Procedure Notes (Signed)
Arterial Line Insertion Start/End1/17/2024 9:50 AM, 12/25/2022 10:00 AM Performed by: Leonides Grills, MD, anesthesiologist  Patient location: OR. Preanesthetic checklist: patient identified, IV checked, site marked, risks and benefits discussed, surgical consent, monitors and equipment checked, pre-op evaluation, timeout performed and anesthesia consent Patient sedated Right, radial was placed Catheter size: 20 G Hand hygiene performed , maximum sterile barriers used  and Seldinger technique used  Attempts: 1 (Previous attempts by CRNA) Procedure performed using ultrasound guided technique. Ultrasound Notes:anatomy identified, needle tip was noted to be adjacent to the nerve/plexus identified and no ultrasound evidence of intravascular and/or intraneural injection Following insertion, dressing applied and Biopatch. Post procedure assessment: normal and unchanged  Post procedure complications: second provider assisted. Patient tolerated the procedure well with no immediate complications.

## 2022-12-25 NOTE — Op Note (Signed)
Video Bronchoscopy with Robotic Assisted Bronchoscopic Navigation   Date of Operation: 12/25/2022   Pre-op Diagnosis: RUL pulmonary nodule  Post-op Diagnosis: same  Surgeon: Judithann Graves  Anesthesia: General endotracheal anesthesia  Operation: Flexible video fiberoptic bronchoscopy with robotic assistance and biopsies.  Estimated Blood Loss: Minimal  Complications: None  Indications and History: BRET STAMOUR is a 77 y.o. female with history of RUL pulmonary  nodule. The risks, benefits, complications, treatment options and expected outcomes were discussed with the patient.  The possibilities of pneumothorax, pneumonia, reaction to medication, pulmonary aspiration, perforation of a viscus, bleeding, failure to diagnose a condition and creating a complication requiring transfusion or operation were discussed with the patient who freely signed the consent.    Description of Procedure: The patient was seen in the Preoperative Area, was examined and was deemed appropriate to proceed.  The patient was taken to [facility] endoscopy room 3, identified as Micael Hampshire and the procedure verified as Flexible Video Fiberoptic Bronchoscopy.  A Time Out was held and the above information confirmed.   Prior to the date of the procedure a high-resolution CT scan of the chest was performed. Utilizing ION software program a virtual tracheobronchial tree was generated to allow the creation of distinct navigation pathways to the patient's parenchymal abnormalities. After being taken to the operating room general anesthesia was initiated and the patient  was orally intubated. The video fiberoptic bronchoscope was introduced via the endotracheal tube and a general inspection was performed which showed normal right and left lung anatomy, aspiration of the bilateral mainstems was completed to remove any remaining secretions. Robotic catheter inserted into patient's endotracheal tube.   Target #1 RUL pulmonary  nodule: The distinct navigation pathways prepared prior to this procedure were then utilized to navigate to patient's lesion identified on CT scan. CIOS imaging was used to aid navigation and confirm ideal location for biopsy. The robotic catheter was secured into place and the vision probe was withdrawn.  Lesion location was approximated using fluoroscopy. Under fluoroscopic guidance transbronchial needle biopsies sent for cytology and pathology. Lesion was marked with dye.   At the end of the procedure a general airway inspection was performed and there was no evidence of active bleeding. The bronchoscope was removed.  The patient tolerated the procedure well. There was no significant blood loss and there were no obvious complications. A post-procedural chest x-ray is pending.  Samples Target #1: 1. Transbronchial needle brushings from RUL pulmonary nodule    Plans:  The patient will be taken to OR for RATS. We will review the cytology, pathology and microbiology results with the patient when they become available. Outpatient followup with myself and Dr. Dorris Fetch.

## 2022-12-25 NOTE — Interval H&P Note (Signed)
History and Physical Interval Note:  12/25/2022 8:22 AM  Donna Dillon  has presented today for surgery, with the diagnosis of RUL nodule.  The various methods of treatment have been discussed with the patient and family. After consideration of risks, benefits and other options for treatment, the patient has consented to  Procedure(s): ROBOTIC ASSISTED NAVIGATIONAL BRONCHOSCOPY (N/A) as a surgical intervention.  The patient's history has been reviewed, patient examined, no change in status, stable for surgery.  I have reviewed the patient's chart and labs.  Questions were answered to the patient's satisfaction.     [name]

## 2022-12-25 NOTE — Anesthesia Procedure Notes (Signed)
Procedure Name: Intubation Date/Time: 12/25/2022 10:13 AM  Performed by: Colin Benton, CRNAPre-anesthesia Checklist: Patient identified, Emergency Drugs available, Suction available and Patient being monitored Patient Re-evaluated:Patient Re-evaluated prior to induction Oxygen Delivery Method: Circle System Utilized Preoxygenation: Pre-oxygenation with 100% oxygen Laryngoscope Size: Mac and 3 Grade View: Grade I Tube type: Oral Endobronchial tube: Left and Double lumen EBT and 35 Fr Number of attempts: 3 Airway Equipment and Method: Stylet and Oral airway Placement Confirmation: ETT inserted through vocal cords under direct vision, positive ETCO2 and breath sounds checked- equal and bilateral Tube secured with: Tape Dental Injury: Teeth and Oropharynx as per pre-operative assessment  Comments: DVL x2 with grade I view - unable to pass [id number]  DVL x1 with grade I view - successfully passed [id number]

## 2022-12-25 NOTE — Discharge Summary (Addendum)
[address]       [place] [address]             [phone]    Physician Discharge Summary  Patient ID: Donna Dillon MRN: [id number] DOB/AGE: 1946-04-11 77 y.o.  Admit date: 12/25/2022 Discharge date: 12/27/2022  Admission Diagnoses: Nodule right upper lobe of lung  Patient Active Problem List   Diagnosis Date Noted   Lung nodule 12/25/2022   S/P partial lobectomy of lung 12/25/2022   Pulmonary emphysema (HCC) - per CXR 12/2018 12/19/2020   Essential hypertension 04/09/2019   Flying phobia 10/28/2018   OCD (obsessive compulsive disorder) 08/22/2018   FHx: heart disease 06/09/2018   BMI 23.0-23.9, adult 09/21/2015   Vitamin D deficiency 03/08/2014   Hyperlipidemia    Abnormal glucose    Osteopenia 02/25/2013   ESOPHAGEAL STRICTURE 06/27/2010   GERD 06/28/2009   PERSONAL HX COLONIC POLYPS 06/28/2009     Discharge Diagnoses: Adenocarcinoma right upper lobe of lung-clinical and pathologic stage Ia (T1, N0) Patient Active Problem List   Diagnosis Date Noted   Lung nodule 12/25/2022   S/P partial lobectomy of lung 12/25/2022   Pulmonary emphysema (HCC) - per CXR 12/2018 12/19/2020   Essential hypertension 04/09/2019   Flying phobia 10/28/2018   OCD (obsessive compulsive disorder) 08/22/2018   FHx: heart disease 06/09/2018   BMI 23.0-23.9, adult 09/21/2015   Vitamin D deficiency 03/08/2014   Hyperlipidemia    Abnormal glucose    Osteopenia 02/25/2013   ESOPHAGEAL STRICTURE 06/27/2010   GERD 06/28/2009   PERSONAL HX COLONIC POLYPS 06/28/2009     Discharged Condition: stable  HPI: This is a 77 year old woman with a history of anxiety, reflux, esophageal stricture, hyperlipidemia, prediabetes, and palpitations.  In July she had an episode of shortness of breath with palpitations and pain across her back.  She thought she might be having a heart attack.  She had a workup which included a CT angiogram.  It showed a 9 mm.  Groundglass opacity in  the right upper lobe.    She is a lifelong non-smoker.  She does complain of some shortness of breath with exertion and that is associated with a rapid heart rate.  She has had some cardiac workup in the past but Dr. Dorris Fetch not find anything other than an EKG in our system.  She saw Dr. Thora Lance.  A repeat CT in November showed the GGO was essentially unchanged.  There was no solid component. Right upper lobe lung nodule- pure ground glass opacity with no solid component.  Infectious and inflammatory nodules as well as low-grade adenocarcinoma are in the differential diagnosis.  If adenocarcinoma this would be a Tis or T1, N0, stage Ia.   Dr. Dorris Fetch discussed several options.  One would be to continue with radiographic follow-up.  That would certainly be acceptable in her case.  The second option would be to do a robotic navigational bronchoscopy for biopsy.  Dr. Dorris Fetch discussed the issues regarding a negative result does not necessarily rule out the possibility of a cancer and she would still need continued follow-up.  The third and most aggressive option would be to resect the nodule.  The nodule would need to be marked with a robotic navigational bronchoscopy first. Pros and cons of the aforementioned were discussed with patient.  Patient wishes to proceed with surgical resection. Potential risks, benefits, and complications of the surgery were discussed with the patient.    Hospital Course: Patient underwent  an Xi robotic assisted right VATS, wedge RUL, LN dissection, and intercostal nerve block. She was extubated and transferred from the OR to PACU in stable condition. A line and foley were removed early in her post op course. Chest tube was to water seal, there was no air leak, and there was minimal drainage. CXR 01/18 showed trace right apical pneumothorax. Chest tube was removed on 01/18. Follow up CXR showed small right apical pneumothorax (no greater than 10% volume), small left  pleural effusion and atelectasis. She has been tolerating a diet. All wounds are clean, dry, healing without signs of infection. She is on Lovenox for DVT prophylaxis. PA.LAT CXR 01/19 showed decrease in right apical pneumothorax, chronic blunting left costophrenic angle. She is surgically stable for discharge today.  Consults: None  Significant Diagnostic Studies:   CLINICAL DATA:  Follow-up right pneumothorax   EXAM: CHEST - 2 VIEW   COMPARISON:  12/26/2022   FINDINGS: Cardiac shadow is stable. Lungs are well aerated bilaterally. Persistent but decreased right-sided apical pneumothorax is noted. Postsurgical changes in the right apex are seen. Chronic blunting of left costophrenic angle is seen. Associated atelectatic changes are noted. No new infiltrate is seen.   IMPRESSION: Decrease in right apical pneumothorax.   Stable changes in the left base.     Electronically Signed   By: Alcide Clever M.D.   On: 12/27/2022 03:34  CLINICAL DATA:  Status post partial lobectomy   EXAM: CHEST - 1 VIEW SAME DAY   COMPARISON:  12/26/2022, 5:15 a.m.   FINDINGS: Interval removal of a previously seen right-sided straight bore chest tube. Small, persistent right apical pneumothorax, no greater than 10% in volume. Unchanged small left pleural effusion and associated atelectasis or consolidation. No new airspace opacity. Heart and mediastinum unremarkable osseous structures unremarkable.   IMPRESSION: 1. Interval removal of a previously seen right-sided straight bore chest tube. Small, persistent right apical pneumothorax, no greater than 10% in volume. 2. Unchanged small left pleural effusion and associated atelectasis or consolidation. No new airspace opacity.     Electronically Signed   By: Jearld Lesch M.D.   On: 12/26/2022 12:07    Treatments: surgery:  Xi robotic-assisted right thoracoscopy, right upper lobe wedge resection, lymph node dissection and intercostal nerve  blocks levels 3 through 10 by Dr. Dorris Fetch on 12/25/2022.  Pathology: Final results pending  Discharge Exam: Blood pressure (!) 100/59, pulse 68, temperature 98.1 F (36.7 C), temperature source Oral, resp. rate 18, height 5' 6.5" (1.689 m), weight 68 kg, last menstrual period 12/09/1994, SpO2 94 %.  Cardiovascular: RRR Pulmonary: Clear to auscultation bilaterally Abdomen: Soft, non tender, bowel sounds present. Extremities: Tracel lower extremity edema. Wounds: Clean and dry.  No erythema or signs of infection.   Discharge Medications:   Allergies as of 12/27/2022       Reactions   Prednisone    Altered mental state   Penicillins Hives, Itching   Asa [aspirin] Diarrhea   Levaquin [levofloxacin] Other (See Comments)   Joint Pain   Macrolides And Ketolides Nausea And Vomiting   Paxil [paroxetine Hcl] Nausea And Vomiting   Tetracyclines & Related Nausea And Vomiting   Zoloft [sertraline Hcl] Nausea And Vomiting   Sulfa Antibiotics Rash   Trimethoprim Rash        Medication List     TAKE these medications    atenolol 25 MG tablet Commonly known as: TENORMIN TAKE 1 TABLET DAILY FOR BLOOD PRESSURE. What changed:  when to take this  additional instructions   clomiPRAMINE 50 MG capsule Commonly known as: ANAFRANIL Take 1 capsule (50 mg total) by mouth at bedtime.   clonazePAM 1 MG tablet Commonly known as: KlonoPIN Take 1.5 tablets (1.5 mg total) by mouth at bedtime.   gabapentin 300 MG capsule Commonly known as: NEURONTIN Take 1 capsule (300 mg total) by mouth at bedtime.   Systane Balance 0.6 % Soln Generic drug: Propylene Glycol Place 1 drop into both eyes as needed (dry eyes).   traMADol 50 MG tablet Commonly known as: ULTRAM Take 1 tablet (50 mg total) by mouth every 6 (six) hours as needed for moderate pain.        Follow-up Information     Loreli Slot, MD. Go on 01/14/2023.   Specialty: Cardiothoracic Surgery Why: Appointment time  is at 4:15 pm Contact information: [address] [employer] [address] [place] [address] [phone]         [facility]. Go on 01/14/2023.   Why: PA/LAT CXR to be taken on 02/06. Please arrive by 3:00 pm Contact information: [address] [place] [address]                Signed:  Ardelle Balls, Cordelia Poche 12/27/2022, 7:29 AM

## 2022-12-25 NOTE — Discharge Instructions (Signed)
Robot-Assisted Thoracic Surgery, Care After The following information offers guidance on how to care for yourself after your procedure. Your health care provider may also give you more specific instructions. If you have problems or questions, contact your health care provider. What can I expect after the procedure? After the procedure, it is common to have: Some pain and aches in the area of your surgical incisions. Pain when breathing in (inhaling) and coughing. Tiredness (fatigue). Trouble sleeping. Constipation. Follow these instructions at home: Medicines Take over-the-counter and prescription medicines only as told by your health care provider. If you were prescribed an antibiotic medicine, take it as told by your health care provider. Do not stop taking the antibiotic even if you start to feel better. Talk with your health care provider about safe and effective ways to manage pain after your procedure. Pain management should fit your specific health needs. Take pain medicine before pain becomes severe. Relieving and controlling your pain will make breathing easier for you. Ask your health care provider if the medicine prescribed to you requires you to avoid driving or using machinery. Eating and drinking Follow instructions from your health care provider about eating or drinking restrictions. These will vary depending on what procedure you had. Your health care provider may recommend: A liquid diet or soft diet for the first few days. Meals that are smaller and more frequent. A diet of fruits, vegetables, whole grains, and low-fat proteins. Limiting foods that are high in fat and processed sugar, including fried or sweet foods. Incision care Follow instructions from your health care provider about how to take care of your incisions. Make sure you: Wash your hands with soap and water for at least 20 seconds before and after you change your bandage (dressing). If soap and water are not  available, use hand sanitizer. Change your dressing as told by your health care provider. Leave stitches (sutures), skin glue, or adhesive strips in place. These skin closures may need to stay in place for 2 weeks or longer. If adhesive strip edges start to loosen and curl up, you may trim the loose edges. Do not remove adhesive strips completely unless your health care provider tells you to do that. Check your incision area every day for signs of infection. Check for: Redness, swelling, or more pain. Fluid or blood. Warmth. Pus or a bad smell. Activity Return to your normal activities as told by your health care provider. Ask your health care provider what activities are safe for you. Ask your health care provider when it is safe for you to drive. Do not lift anything that is heavier than 10 lb (4.5 kg), or the limit that you are told, until your health care provider says that it is safe. Rest as told by your health care provider. Avoid sitting for a long time without moving. Get up to take short walks every 1-2 hours. This is important to improve blood flow and breathing. Ask for help if you feel weak or unsteady. Do exercises as told by your health care provider. Pneumonia prevention  Do deep breathing exercises and cough regularly as directed. This helps clear mucus and opens your lungs. Doing this helps prevent lung infection (pneumonia). If you were given an incentive spirometer, use it as told. An incentive spirometer is a tool that measures how well you are filling your lungs with each breath. Coughing may hurt less if you try to support your chest. This is called splinting. Try one of these when you  cough: Hold a pillow against your chest. Place the palms of both hands on top of your incision area. Do not use any products that contain nicotine or tobacco. These products include cigarettes, chewing tobacco, and vaping devices, such as e-cigarettes. If you need help quitting, ask your  health care provider. Avoid secondhand smoke. General instructions If you have a drainage tube: Follow instructions from your health care provider about how to take care of it. Do not travel by airplane after your tube is removed until your health care provider tells you it is safe. You may need to take these actions to prevent or treat constipation: Drink enough fluid to keep your urine pale yellow. Take over-the-counter or prescription medicines. Eat foods that are high in fiber, such as beans, whole grains, and fresh fruits and vegetables. Limit foods that are high in fat and processed sugars, such as fried or sweet foods. Keep all follow-up visits. This is important. Contact a health care provider if: You have redness, swelling, or more pain around an incision. You have fluid or blood coming from an incision. An incision feels warm to the touch. You have pus or a bad smell coming from an incision. You have a fever. You cannot eat or drink without vomiting. Your pain medicine is not controlling your pain. Get help right away if: You have chest pain. Your heart is beating quickly. You have trouble breathing. You have trouble speaking. You are confused. You feel weak or dizzy, or you faint. These symptoms may represent a serious problem that is an emergency. Do not wait to see if the symptoms will go away. Get medical help right away. Call your local emergency services (911 in the [place] Do not drive yourself to the hospital. Summary Talk with your health care provider about safe and effective ways to manage pain after your procedure. Pain management should fit your specific health needs. Return to your normal activities as told by your health care provider. Ask your health care provider what activities are safe for you. Do deep breathing exercises and cough regularly as directed. This helps to clear mucus and prevent pneumonia. If it hurts to cough, ease pain by holding a pillow  against your chest or by placing the palms of both hands over your incisions. This information is not intended to replace advice given to you by your health care provider. Make sure you discuss any questions you have with your health care provider. Document Revised: 08/18/2020 Document Reviewed: 08/18/2020 Elsevier Patient Education  2023 [employer].

## 2022-12-25 NOTE — Brief Op Note (Addendum)
12/25/2022  11:58 AM  PATIENT:  Donna Dillon  77 y.o. female  PRE-OPERATIVE DIAGNOSIS:  RUL GROUND GLASS OPACITY  POST-OPERATIVE DIAGNOSIS: Adenocarcinoma right upper lobe, clinical stage Ia (T1, N0)  PROCEDURE:  Procedure(s):  XI ROBOTIC ASSISTED RIGHT THORACOSCOPY RIGHT UPPER LOBE WEDGE RESECTION (Right) NODE DISSECTION (Right) INTERCOSTAL NERVE BLOCK (Right)   SURGEON:  Surgeon(s) and Role:    Loreli Slot, MD - Primary  PHYSICIAN ASSISTANT: Lowella Dandy PA-C, Thana Ates PA-Student  ASSISTANTS: Otilio Miu CST   ANESTHESIA:   general  EBL:  50 mL   BLOOD ADMINISTERED:none  DRAINS:  28 Blake Drain    LOCAL MEDICATIONS USED:  Exparel   SPECIMEN:  Source of Specimen:  Wedge Resection RUL, Lymph Nodes  DISPOSITION OF SPECIMEN:  PATHOLOGY  COUNTS:  YES  TOURNIQUET:  * No tourniquets in log *  DICTATION: .done  PLAN OF CARE: Admit to inpatient   PATIENT DISPOSITION:  PACU - hemodynamically stable.   Delay start of Pharmacological VTE agent (>24hrs) due to surgical blood loss or risk of bleeding: no

## 2022-12-25 NOTE — Transfer of Care (Signed)
Immediate Anesthesia Transfer of Care Note  Patient: Donna Dillon  Procedure(s) Performed: ROBOTIC ASSISTED NAVIGATIONAL BRONCHOSCOPY BRONCHIAL NEEDLE ASPIRATION BIOPSIES FIDUCIAL DYE MARKING  Patient Location: PACU  Anesthesia Type:General  Level of Consciousness: awake and alert   Airway & Oxygen Therapy: Patient Spontanous Breathing and Patient connected to face mask oxygen  Post-op Assessment: Report given to RN and Post -op Vital signs reviewed and stable  Post vital signs: Reviewed and stable  Last Vitals:  Vitals Value Taken Time  BP 93/53 12/25/22 1218  Temp    Pulse 67 12/25/22 1220  Resp 16 12/25/22 1220  SpO2 99 % 12/25/22 1220  Vitals shown include unvalidated device data.  Last Pain:  Vitals:   12/25/22 0654  PainSc: 0-No pain         Complications: No notable events documented.

## 2022-12-25 NOTE — H&P (Signed)
Lab review called formula PCP is Unk Pinto, MD Referring Provider is Maryjane Hurter, MD       Chief Complaint  Patient presents with   Lung Lesion      Surgical consult/ Chest CT 10/16/22/ PFT's 10/30/22      HPI: Donna Dillon is sent for consultation regarding a groundglass opacity in the right upper lobe.   Donna Dillon is a 77 year old woman with a history of anxiety, reflux, esophageal stricture, hyperlipidemia, prediabetes, and palpitations.  In July Donna Dillon had an episode of shortness of breath with palpitations and pain across Donna Dillon back.  Donna Dillon thought Donna Dillon might be having a heart attack.  Donna Dillon had a workup which included a CT angiogram.  It showed a 9 mm.  Groundglass opacity in the right upper lobe.   Donna Dillon saw Dr. Verlee Monte.  A repeat CT in November showed the GGO was essentially unchanged.  There was no solid component.   Donna Dillon is a lifelong non-smoker.  Donna Dillon does complain of some shortness of breath with exertion and that is associated with a rapid heart rate.  Donna Dillon has had some cardiac workup in the past but I do not find anything other than an EKG in our system.   Zubrod Score: At the time of surgery this patient's most appropriate activity status/level should be described as: []     0    Normal activity, no symptoms [x]     1    Restricted in physical strenuous activity but ambulatory, able to do out light work []     2    Ambulatory and capable of self care, unable to do work activities, up and about >50 % of waking hours                              []     3    Only limited self care, in bed greater than 50% of waking hours []     4    Completely disabled, no self care, confined to bed or chair []     5    Moribund       Past Medical History:  Diagnosis Date   Anemia      mild   Anxiety     Colon polyp      hyperplastic   Diverticulosis     Esophageal stricture     GERD (gastroesophageal reflux disease)      no s/s now- off medicines   Hyperlipidemia      Palpitations     Prediabetes     Tachycardia             Past Surgical History:  Procedure Laterality Date   BREAST BIOPSY Left     CESAREAN SECTION        x1   COLONOSCOPY   2017   eye lift       POLYPECTOMY       TONSILLECTOMY       UPPER GASTROINTESTINAL ENDOSCOPY               Family History  Problem Relation Age of Onset   Hypertension Mother     Heart disease Mother     Kidney cancer Father     Emphysema Father          smoked   Colon cancer Paternal Aunt     Colon polyps Neg Hx     Esophageal cancer Neg Hx  Rectal cancer Neg Hx     Stomach cancer Neg Hx        Social History Social History         Tobacco Use   Smoking status: Never      Passive exposure: Past   Smokeless tobacco: Never  Vaping Use   Vaping Use: Never used  Substance Use Topics   Alcohol use: No      Alcohol/week: 0.0 standard drinks of alcohol   Drug use: No            Current Outpatient Medications  Medication Sig Dispense Refill   atenolol (TENORMIN) 25 MG tablet TAKE 1 TABLET DAILY FOR BLOOD PRESSURE. 90 tablet 3   Calcium 1500 MG tablet Take 1,500 mg by mouth daily.        clomiPRAMINE (ANAFRANIL) 50 MG capsule Take 1 capsule (50 mg total) by mouth at bedtime. 90 capsule 2   clonazePAM (KLONOPIN) 1 MG tablet Take 1.5 tablets (1.5 mg total) by mouth at bedtime. 45 tablet 5   ezetimibe (ZETIA) 10 MG tablet Take 1 tablet Daily for Cholesterol 90 tablet 3   fish oil-omega-3 fatty acids 1000 MG capsule Take 1 g by mouth daily.       magnesium 30 MG tablet Take 30 mg by mouth daily.        Multiple Vitamin (MULTIVITAMIN) tablet Take 1 tablet by mouth daily.       Multiple Vitamins-Minerals (EYE VITAMINS PO) Take 1 capsule by mouth daily. Ocusight Vitamins       QUEtiapine (SEROQUEL) 25 MG tablet Take 1 tablet (25 mg total) by mouth at bedtime. 90 tablet 1   vitamin C (ASCORBIC ACID) 500 MG tablet Take 500 mg by mouth daily.       VITAMIN D PO Take 5,000 Units by mouth. Takes 1  to 2 capsules daily.       rosuvastatin (CRESTOR) 20 MG tablet Take 1 tablet Daily for Cholesterol (Patient not taking: Reported on 11/20/2022) 90 tablet 3    No current facility-administered medications for this visit.           Allergies  Allergen Reactions   Prednisone        Altered mental state   Penicillins     Asa [Aspirin] Diarrhea   Levaquin [Levofloxacin] Other (See Comments)      Joint Pain   Macrolides And Ketolides Nausea And Vomiting   Paxil [Paroxetine Hcl] Nausea And Vomiting   Tetracyclines & Related Nausea And Vomiting   Zoloft [Sertraline Hcl] Nausea And Vomiting   Sulfa Antibiotics Rash   Trimethoprim Rash      Review of Systems  Constitutional:  Positive for activity change and fatigue.  Eyes:  Positive for visual disturbance (Blurry).  Respiratory:  Positive for cough and shortness of breath.   Cardiovascular:  Positive for palpitations. Negative for chest pain.  Gastrointestinal:  Positive for abdominal pain (Reflux).  Psychiatric/Behavioral:  The patient is nervous/anxious.   All other systems reviewed and are negative.     BP 102/65 (BP Location: Left Arm, Patient Position: Sitting)   Pulse 81   Resp 18   Ht 5\' 6"  (1.676 m)   Wt 148 lb (67.1 kg)   LMP 12/09/1994   SpO2 97% Comment: RA  BMI 23.89 kg/m  Physical Exam Vitals reviewed.  Constitutional:      General: Donna Dillon is not in acute distress.    Appearance: Normal appearance.  HENT:     Head:  Normocephalic and atraumatic.  Eyes:     General: No scleral icterus.    Extraocular Movements: Extraocular movements intact.  Cardiovascular:     Rate and Rhythm: Normal rate and regular rhythm.     Heart sounds: Normal heart sounds. No murmur heard.    No friction rub. No gallop.  Pulmonary:     Effort: Pulmonary effort is normal. No respiratory distress.     Breath sounds: Normal breath sounds. No wheezing or rales.  Abdominal:     General: There is no distension.     Palpations: Abdomen is  soft.  Skin:    General: Skin is warm and dry.  Neurological:     General: No focal deficit present.     Mental Status: Donna Dillon is alert and oriented to person, place, and time.     Cranial Nerves: No cranial nerve deficit.     Motor: No weakness.      Diagnostic Tests: CT CHEST WITHOUT CONTRAST   TECHNIQUE: Multidetector CT imaging of the chest was performed using thin slice collimation for electromagnetic bronchoscopy planning purposes, without intravenous contrast.   RADIATION DOSE REDUCTION: This exam was performed according to the departmental dose-optimization program which includes automated exposure control, adjustment of the mA and/or kV according to patient size and/or use of iterative reconstruction technique.   COMPARISON:  Chest CTA 06/18/2022.   FINDINGS: Cardiovascular: The heart size is normal. No substantial pericardial effusion. Mild atherosclerotic calcification is noted in the wall of the thoracic aorta.   Mediastinum/Nodes: No mediastinal lymphadenopathy. No evidence for gross hilar lymphadenopathy although assessment is limited by the lack of intravenous contrast on the current study. The esophagus has normal imaging features. Moderate hiatal hernia evident. There is no axillary lymphadenopathy.   Lungs/Pleura: Stable 9 mm ground-glass opacity in the right upper lobe (image 20/series 3). No other suspicious pulmonary nodule or mass. Calcified granuloma noted peripheral right lower lobe. Subsegmental atelectasis or linear scarring noted in the inferior lungs bilaterally.   Upper Abdomen: 1.7 cm water density lesion upper pole right kidney is compatible with a cyst. No followup imaging is recommended.   Musculoskeletal: No worrisome lytic or sclerotic osseous abnormality.   IMPRESSION: 1. Stable 9 mm ground-glass opacity in the right upper lobe. CT is recommended every 2 years until 5 years of stability has been established. This recommendation  follows the consensus statement: Guidelines for Management of Incidental Pulmonary Nodules Detected on CT Images: From the Fleischner Society 2017; Radiology 2017; 284:228-243. 2. Moderate hiatal hernia. 3.  Aortic Atherosclerosis (ICD10-I70.0).     Electronically Signed   By: Misty Stanley M.D.   On: 10/18/2022 09:20 I personally reviewed the CT images.  There is a 9 mm groundglass opacity in the right upper lobe.  No solid component.  Hiatal hernia.   Impression: Donna Dillon is a 77 year old woman with a history of anxiety, reflux, esophageal stricture, hyperlipidemia, prediabetes, and palpitations.  Donna Dillon was found to have a groundglass opacity in the right upper lobe in July.  On follow-up CT the lesion is stable to possibly minimally enlarged.   Right upper lobe lung nodule- pure ground glass opacity with no solid component.  Infectious and inflammatory nodules as well as low-grade adenocarcinoma are in the differential diagnosis.  If adenocarcinoma this would be a Tis or T1, N0, stage Ia.   We discussed several options.  One would be to continue with radiographic follow-up.  That would certainly be acceptable in Donna Dillon case.  The  second option would be to do a robotic navigational bronchoscopy for biopsy.  I discussed the issues regarding a negative result does not necessarily rule out the possibility of a cancer and Donna Dillon would still need continued follow-up.  The third and most aggressive option would be to resect the nodule.  The nodule would need to be marked with a robotic navigational bronchoscopy first.   We discussed the pros and cons of each approach and it really comes down to how Donna Dillon worried Donna Dillon is about the nodule and how aggressive Donna Dillon wants to be.   I did discuss what surgical resection would entail.  The plan would be to do a robotic right VATS for wedge resection.  Since it is a pure groundglass opacity and his peripheral Donna Dillon would not need a lobectomy.  I informed Donna Dillon  of the general nature of the procedure including the need for general anesthesia, the incisions to be used, the use of the surgical robot, use of a drainage tube postoperatively, the expected hospital stay, and the overall recovery.  I informed Donna Dillon of the indications, risks, benefits, and alternatives.  Donna Dillon understands the risks include, but are not limited to death, MI, DVT, PE, bleeding, possible need for transfusion, infection, prolonged air leak, cardiac arrhythmias, as well as possibility of other unforeseeable complications.  Donna Dillon understands the degree of pain is unpredictable.     Plan:   Donna Dillon will think over Donna Dillon options and let Dr. Thora Lance and myself know Donna Dillon would like to proceed.   I spent over 60 minutes in review of records, images, and in consultation with Donna Dillon today. Loreli Slot, MD [facility] [phone]          Electronically signed by Loreli Slot, MD at 11/20/2022  5:26 PM  No changes  Salvatore Decent. Dorris Fetch, MD [facility] [phone]

## 2022-12-25 NOTE — Anesthesia Postprocedure Evaluation (Signed)
Anesthesia Post Note  Patient: Donna Dillon  Procedure(s) Performed: ROBOTIC ASSISTED NAVIGATIONAL BRONCHOSCOPY BRONCHIAL NEEDLE ASPIRATION BIOPSIES FIDUCIAL DYE MARKING XI ROBOTIC ASSISTED THORACOSCOPY-RIGHT UPPER LOBE WEDGE RESECTION (Right: Chest) INTERCOSTAL NERVE BLOCK (Right: Chest) NODE DISSECTION (Right: Chest)     Patient location during evaluation: PACU Anesthesia Type: General Level of consciousness: awake Pain management: pain level controlled Vital Signs Assessment: post-procedure vital signs reviewed and stable Respiratory status: spontaneous breathing, nonlabored ventilation and respiratory function stable Cardiovascular status: blood pressure returned to baseline and stable Postop Assessment: no apparent nausea or vomiting Anesthetic complications: no   No notable events documented.  Last Vitals:  Vitals:   12/25/22 1300 12/25/22 1343  BP: (!) 92/56   Pulse: 68   Resp: 12   Temp: (!) 36.3 C (!) 36.4 C  SpO2: 96%     Last Pain:  Vitals:   12/25/22 1343  TempSrc: Oral  PainSc:                  Izen Petz P Sanora Cunanan

## 2022-12-25 NOTE — Interval H&P Note (Signed)
History and Physical Interval Note:  12/25/2022 7:26 AM  Donna Dillon  has presented today for surgery, with the diagnosis of RUL GROUND GLASS OPACITY.  The various methods of treatment have been discussed with the patient and family. After consideration of risks, benefits and other options for treatment, the patient has consented to  Procedure(s): XI ROBOTIC ASSISTED THORACOSCOPY-RIGHT UPPER LOBE WEDGE RESECTION (Right) as a surgical intervention.  The patient's history has been reviewed, patient examined, no change in status, stable for surgery.  I have reviewed the patient's chart and labs.  Questions were answered to the patient's satisfaction.     Loreli Slot

## 2022-12-26 ENCOUNTER — Encounter (HOSPITAL_COMMUNITY): Payer: Self-pay | Admitting: Thoracic Surgery (Cardiothoracic Vascular Surgery)

## 2022-12-26 ENCOUNTER — Inpatient Hospital Stay (HOSPITAL_COMMUNITY): Payer: Medicare PPO

## 2022-12-26 LAB — CBC
HCT: 32.4 % — ABNORMAL LOW (ref 36.0–46.0)
Hemoglobin: 10.7 g/dL — ABNORMAL LOW (ref 12.0–15.0)
MCH: 29.2 pg (ref 26.0–34.0)
MCHC: 33 g/dL (ref 30.0–36.0)
MCV: 88.5 fL (ref 80.0–100.0)
Platelets: 182 10*3/uL (ref 150–400)
RBC: 3.66 MIL/uL — ABNORMAL LOW (ref 3.87–5.11)
RDW: 12.7 % (ref 11.5–15.5)
WBC: 10.9 10*3/uL — ABNORMAL HIGH (ref 4.0–10.5)
nRBC: 0 % (ref 0.0–0.2)

## 2022-12-26 LAB — BASIC METABOLIC PANEL
Anion gap: 7 (ref 5–15)
BUN: 9 mg/dL (ref 8–23)
CO2: 22 mmol/L (ref 22–32)
Calcium: 8.4 mg/dL — ABNORMAL LOW (ref 8.9–10.3)
Chloride: 102 mmol/L (ref 98–111)
Creatinine, Ser: 0.86 mg/dL (ref 0.44–1.00)
GFR, Estimated: >60 mL/min (ref >60)
Glucose, Bld: 115 mg/dL — ABNORMAL HIGH (ref 70–99)
Potassium: 3.9 mmol/L (ref 3.5–5.1)
Sodium: 131 mmol/L — ABNORMAL LOW (ref 135–145)

## 2022-12-26 MED ORDER — SODIUM CHLORIDE 0.9 % IV SOLN: INTRAVENOUS | Status: DC

## 2022-12-26 MED ORDER — TRAMADOL HCL 50 MG PO TABS: 50.0000 mg | ORAL_TABLET | Freq: Four times a day (QID) | ORAL | Status: DC | PRN

## 2022-12-26 MED ORDER — MENTHOL 3 MG MT LOZG: 1.0000 | LOZENGE | OROMUCOSAL | Status: DC | PRN

## 2022-12-26 MED ORDER — ATENOLOL 25 MG PO TABS
25.0000 mg | ORAL_TABLET | Freq: Every day | ORAL | Status: DC
  Filled 2022-12-26: qty 1

## 2022-12-26 NOTE — Plan of Care (Signed)
  Problem: Activity: Goal: Risk for activity intolerance will decrease Outcome: Progressing   Problem: Respiratory: Goal: Respiratory status will improve Outcome: Progressing   Problem: Pain Management: Goal: Pain level will decrease Outcome: Progressing   Problem: Activity: Goal: Risk for activity intolerance will decrease Outcome: Progressing   Problem: Coping: Goal: Level of anxiety will decrease Outcome: Progressing   Problem: Pain Managment: Goal: General experience of comfort will improve Outcome: Progressing   Problem: Safety: Goal: Ability to remain free from injury will improve Outcome: Progressing

## 2022-12-26 NOTE — Progress Notes (Addendum)
[address]       [place] [address]             [phone]       1 Day Post-Op Procedure(s) (LRB): XI ROBOTIC ASSISTED THORACOSCOPY-RIGHT UPPER LOBE WEDGE RESECTION (Right) INTERCOSTAL NERVE BLOCK (Right) NODE DISSECTION (Right)  Subjective: Patient just waking up. She has numbness across anterior lower right chest (intercostal nerve block). She is hoarse and has a sore throat (likely from ETT).  Objective: Vital signs in last 24 hours: Temp:  [97.4 F (36.3 C)-99.3 F (37.4 C)] 98.5 F (36.9 C) (01/18 0500) Pulse Rate:  [67-77] 76 (01/18 0036) Cardiac Rhythm: Normal sinus rhythm (01/17 1900) Resp:  [12-17] 15 (01/18 0036) BP: (84-105)/(47-68) 88/51 (01/18 0036) SpO2:  [92 %-100 %] 94 % (01/18 0036) Arterial Line BP: (111-119)/(61-66) 119/63 (01/17 1300)      Intake/Output from previous day: 01/17 0701 - 01/18 0700 In: 1640.1 [P.O.:120; I.V.:1520.1] Out: 1632 [Urine:1480; Blood:50; Chest Tube:102]   Physical Exam:  Cardiovascular: RRR Pulmonary: Clear to auscultation bilaterally Abdomen: Soft, non tender, bowel sounds present. Extremities: Tracel lower extremity edema. Wounds: Clean and dry.  No erythema or signs of infection. Chest Tube:to water seal, tidling with cough  Lab Results: CBC: Recent Labs    12/23/22 1400 12/26/22 0032  WBC 7.5 10.9*  HGB 14.2 10.7*  HCT 44.0 32.4*  PLT 238 182   BMET:  Recent Labs    12/23/22 1400 12/26/22 0032  NA 137 131*  K 4.1 3.9  CL 104 102  CO2 23 22  GLUCOSE 103* 115*  BUN 16 9  CREATININE 0.91 0.86  CALCIUM 9.6 8.4*    PT/INR:  Recent Labs    12/23/22 1400  LABPROT 12.7  INR 1.0   ABG:  INR: Will add last result for INR, ABG once components are confirmed Will add last 4 CBG results once components are confirmed  Assessment/Plan:  1. CV - SR. On Atenolol 25 mg at hs. Will hold for now secondary to labile BP 2.  Pulmonary - On room air. Chest tube to water seal.  Chest tube with 102 cc since surgery and tidling with cough. CXR this am appears to show ? Trace left and right apical pneumothoraces . ? remove chest tube this am vs one more day. Encourage incentive spirometer. Await final pathology. 3. On Lovenox for DVT prophylaxis 4. Hyponatremia-Sodium 131. Chang and supplement with IVF 5. Expected post op blood loss anemia-H and H this am 10.7 and 32.4 6. Remove foley 7. GI-advance diet. No nausea or vomiting. 8. Cepacol lozenges PRN 9. Regarding pain control, continue scheduled Toradol, Neurontin. Per patient, she does not want Oxy so will add Ultram PRN  Lelon Huh [facility] 12/26/2022,7:03 AM  Patient seen and examined.  Agree with findings, assessment, and plan noted above.   No air leak and minimal apical space on chest x-ray. Will DC chest tube today Ambulate  Viviann Spare C. Dorris Fetch, MD [facility] [phone]

## 2022-12-26 NOTE — Progress Notes (Signed)
Mobility Specialist Progress Note    12/26/22 1235  Mobility  Activity Ambulated with assistance in hallway  Level of Assistance Standby assist, set-up cues, supervision of patient - no hands on  Assistive Device Front wheel walker  Distance Ambulated (ft) 350 ft  Activity Response Tolerated well  Mobility Referral Yes  $Mobility charge 1 Mobility   Pre-Mobility: 70 HR, 97/56 (68) BP, 96% SpO2 During Mobility: 80 HR, 96% SpO2 Post-Mobility: 70 HR, 97% SpO2  Pt received in bed and agreeable. No complaints on walk. Returned to bed with call bell in reach.     [place] Mobility Specialist  Please Neurosurgeon or Rehab Office at [phone]

## 2022-12-26 NOTE — Op Note (Signed)
Donna Dillon, Donna Dillon MEDICAL RECORD NO: [id number] ACCOUNT NO: [id number] DATE OF BIRTH: 02-01-1946 FACILITY: [facility] LOCATION: [facility] PHYSICIAN: Revonda Standard. Roxan Hockey, MD  Operative Report   DATE OF PROCEDURE: 12/25/2022  PREOPERATIVE DIAGNOSIS:  Ground glass opacity right upper lobe.  POSTOPERATIVE DIAGNOSIS:  Adenocarcinoma, right upper lobe, clinical stage IA (T1, N0).  PROCEDURE:   Xi robotic-assisted right thoracoscopy,  Right upper lobe wedge resection,  Lymph node dissection and  Intercostal nerve blocks levels 3 through 10.  SURGEON:  Revonda Standard. Roxan Hockey, MD  ASSISTANT:  Ellwood Handler, PA and Garlan Fillers, PA student.  ANESTHESIA:  General.  FINDINGS:  Marking from bronchoscopy clearly visible.  Resected with a greater than 2 cm gross margin.  Frozen section revealed an adenocarcinoma.  Margin free of tumor.  CLINICAL NOTE: Donna Dillon is a 77 year old woman who was found to have a 9 mm ground glass opacity in the right upper lobe on a CT angiogram. On followup the nodule was unchanged.  There was no solid component.  She was seen in consultation by Dr. Bjorn Loser and referred to me for possible surgical resection. Because of the small size the plan was to do a navigational bronchoscopy for marking of the nodule followed by surgical resection given the pure ground glass opacity nature of the lesion.  Plan was to do only a  wedge resection and not an anatomical procedure.  The indications, risks, benefits, and alternatives were discussed in detail with the patient.  She understood and accepted the risks and agreed to proceed.  OPERATIVE NOTE: Donna Dillon was brought to the preoperative holding area on 12/25/2022.  Anesthesia established intravenous access.  She was taken to the endoscopy suite by Dr. Verlee Monte who performed a navigational bronchoscopy and marked the nodule with a solution containing methylene blue and ICG.  She then was transported intubated to the  operating  room where an arterial blood pressure monitoring line was placed.  Her endotracheal tube was switched out for a double lumen endotracheal tube.  Intravenous antibiotics were administered.  Sequential compression devices were in place for DVT prophylaxis.  A Foley catheter was placed.  She was placed in a left lateral decubitus position and a Bair Hugger was placed for active warming.  The right chest was prepped and draped in the usual sterile fashion.  Single lung ventilation of the left lung was initiated.  Shortly after initiating single lung ventilation she had a desaturation requiring dual lung ventilation, but then tolerated single lung ventilation without any further issues throughout the remainder of the procedure.  Timeout was performed.  A solution containing 20 mL of liposomal bupivacaine, 30 mL of 0.5% bupivacaine and 50 mL of saline was prepared.  This solution was used for local at the incision sites as well as for the intercostal nerve blocks.  An incision was made in the eighth interspace in approximately the midaxillary line, and an 8 mm port was inserted.  The thoracoscope was advanced into the chest. After confirming intrapleural placement, carbon dioxide was insufflated per protocol.  An additional 12 mm port was placed anterior to the robotic port.  It was noted that there were adhesions in the inferior aspect of the chest and there was a small pleural tear with the initial port placement.  Intercostal nerve blocks then were performed from the third to the tenth interspace injecting 10 mL of the bupivacaine solution into a subpleural plane at each level.  An AirSeal port was placed in the tenth interspace  posterolaterally.  Two additional eighth interspace ports were placed.  The robot was deployed.  The camera arm was docked, and targeting was performed.  The remaining arms were docked.  Robotic instruments were inserted with thoracoscopic visualization.  With the Firefly setting on the  robotic console, the marked area was clearly visible.  Methylene blue was also visible under direct lighting. The location of the marking was consistent with the location of the nodule on the PET/CT.  A wedge resection was performed with sequential firings of the robotic stapler.  The specimen was placed into an endoscopic retrieval bag, removed, and sent for frozen section of the nodule and the margin.  While awaiting those results, the pleural reflection was divided at the hilum posteriorly, and a level 7 node was removed.  The space at the bifurcation of the right upper lobe bronchus and the bronchus intermedius was dissected out and a level 11 node was removed.  Working further superiorly a level 10 node was removed as  well.  The fissure was incomplete and it was not dissected.  The frozen section returned showing adenocarcinoma.  The margin was clear.  The sponges that had been placed during the dissection were removed.  The visceral pleural tear in the lower lobe was stapled using the robotic stapler.  The robotic instruments then were removed.  The robot was undocked.  The chest was copiously irrigated with saline.  A test inflation showed no evidence of an air leak.  A 20-French Blake drain was placed through the original port incision and directed to the apex.  It was secured to the skin with #1 silk suture.  The incisions were closed in standard fashion.  Dermabond was applied.  The chest tube was placed to a Pleur-Evac on waterseal.  She was placed back in a supine position.  She was extubated in the operating room and taken to the postanesthetic care unit in good condition.  Excretes assistance was necessary for this case due to surgical complexity.  Lowella Dandy, PA served as Museum/gallery curator.  She provided assistance with port insertion, robot docking and undocking, instrument exchange, suctioning, specimen retrieval, and wound closure.  PUS D: 12/26/2022 4:18:26 pm T: 12/26/2022 8:43:00 pm  JOB:  [id number]/ [id number]

## 2022-12-27 ENCOUNTER — Inpatient Hospital Stay (HOSPITAL_COMMUNITY): Payer: Medicare PPO

## 2022-12-27 LAB — COMPREHENSIVE METABOLIC PANEL
ALT: 12 U/L (ref 0–44)
AST: 11 U/L — ABNORMAL LOW (ref 15–41)
Albumin: 2.9 g/dL — ABNORMAL LOW (ref 3.5–5.0)
Alkaline Phosphatase: 54 U/L (ref 38–126)
Anion gap: 6 (ref 5–15)
BUN: 17 mg/dL (ref 8–23)
CO2: 23 mmol/L (ref 22–32)
Calcium: 8.2 mg/dL — ABNORMAL LOW (ref 8.9–10.3)
Chloride: 103 mmol/L (ref 98–111)
Creatinine, Ser: 0.91 mg/dL (ref 0.44–1.00)
GFR, Estimated: >60 mL/min (ref >60)
Glucose, Bld: 101 mg/dL — ABNORMAL HIGH (ref 70–99)
Potassium: 4 mmol/L (ref 3.5–5.1)
Sodium: 132 mmol/L — ABNORMAL LOW (ref 135–145)
Total Bilirubin: 0.5 mg/dL (ref 0.3–1.2)
Total Protein: 5.2 g/dL — ABNORMAL LOW (ref 6.5–8.1)

## 2022-12-27 LAB — CBC
HCT: 33.8 % — ABNORMAL LOW (ref 36.0–46.0)
Hemoglobin: 11.3 g/dL — ABNORMAL LOW (ref 12.0–15.0)
MCH: 29.4 pg (ref 26.0–34.0)
MCHC: 33.4 g/dL (ref 30.0–36.0)
MCV: 88 fL (ref 80.0–100.0)
Platelets: 174 10*3/uL (ref 150–400)
RBC: 3.84 MIL/uL — ABNORMAL LOW (ref 3.87–5.11)
RDW: 12.9 % (ref 11.5–15.5)
WBC: 9.5 10*3/uL (ref 4.0–10.5)
nRBC: 0 % (ref 0.0–0.2)

## 2022-12-27 LAB — SURGICAL PATHOLOGY

## 2022-12-27 MED ORDER — TRAMADOL HCL 50 MG PO TABS: 50.0000 mg | ORAL_TABLET | Freq: Four times a day (QID) | ORAL | 0 refills | Status: DC | PRN

## 2022-12-27 MED ORDER — GABAPENTIN 300 MG PO CAPS: 300.0000 mg | ORAL_CAPSULE | Freq: Every day | ORAL | 0 refills | Status: DC

## 2022-12-27 NOTE — Plan of Care (Signed)
  Problem: Education: Goal: Knowledge of disease or condition will improve Outcome: Adequate for Discharge Goal: Knowledge of the prescribed therapeutic regimen will improve Outcome: Adequate for Discharge   Problem: Activity: Goal: Risk for activity intolerance will decrease Outcome: Adequate for Discharge   Problem: Cardiac: Goal: Will achieve and/or maintain hemodynamic stability Outcome: Adequate for Discharge   Problem: Clinical Measurements: Goal: Postoperative complications will be avoided or minimized Outcome: Adequate for Discharge   Problem: Respiratory: Goal: Respiratory status will improve Outcome: Adequate for Discharge   Problem: Pain Management: Goal: Pain level will decrease Outcome: Adequate for Discharge   Problem: Skin Integrity: Goal: Wound healing without signs and symptoms infection will improve Outcome: Adequate for Discharge   Problem: Education: Goal: Knowledge of General Education information will improve Description: Including pain rating scale, medication(s)/side effects and non-pharmacologic comfort measures Outcome: Adequate for Discharge   Problem: Health Behavior/Discharge Planning: Goal: Ability to manage health-related needs will improve Outcome: Adequate for Discharge   Problem: Clinical Measurements: Goal: Ability to maintain clinical measurements within normal limits will improve Outcome: Adequate for Discharge Goal: Will remain free from infection Outcome: Adequate for Discharge Goal: Diagnostic test results will improve Outcome: Adequate for Discharge Goal: Respiratory complications will improve Outcome: Adequate for Discharge Goal: Cardiovascular complication will be avoided Outcome: Adequate for Discharge   Problem: Activity: Goal: Risk for activity intolerance will decrease Outcome: Adequate for Discharge   Problem: Nutrition: Goal: Adequate nutrition will be maintained Outcome: Adequate for Discharge   Problem:  Coping: Goal: Level of anxiety will decrease Outcome: Adequate for Discharge   Problem: Elimination: Goal: Will not experience complications related to bowel motility Outcome: Adequate for Discharge Goal: Will not experience complications related to urinary retention Outcome: Adequate for Discharge   Problem: Pain Managment: Goal: General experience of comfort will improve Outcome: Adequate for Discharge   Problem: Safety: Goal: Ability to remain free from injury will improve Outcome: Adequate for Discharge   Problem: Skin Integrity: Goal: Risk for impaired skin integrity will decrease Outcome: Adequate for Discharge

## 2022-12-27 NOTE — Care Management Important Message (Signed)
Important Message  Patient Details  Name: [name] MRN: [id number] Date of Birth: July 11, 1946   Medicare Important Message Given:  Yes     Chisum Habenicht Stefan Church 12/27/2022, 2:51 PM

## 2022-12-27 NOTE — Progress Notes (Signed)
Pt and family requested to be observed until 2pm and then pt will discharge home.

## 2022-12-27 NOTE — Progress Notes (Addendum)
[address]       [place] [address]             [phone]       2 Days Post-Op Procedure(s) (LRB): XI ROBOTIC ASSISTED THORACOSCOPY-RIGHT UPPER LOBE WEDGE RESECTION (Right) INTERCOSTAL NERVE BLOCK (Right) NODE DISSECTION (Right)  Subjective: Patient walked several times yesterday. She is just waking up this am. She thinks she would like to go home later today.  Objective: Vital signs in last 24 hours: Temp:  [98.1 F (36.7 C)-98.8 F (37.1 C)] 98.1 F (36.7 C) (01/19 0334) Pulse Rate:  [67-69] 68 (01/19 0334) Cardiac Rhythm: Normal sinus rhythm (01/19 0334) Resp:  [17-20] 18 (01/19 0334) BP: (98-102)/(52-64) 100/59 (01/19 0334) SpO2:  [94 %-97 %] 94 % (01/19 0334)      Intake/Output from previous day: 01/18 0701 - 01/19 0700 In: 250 [I.V.:250] Out: 351 [Urine:350; Stool:1]   Physical Exam:  Cardiovascular: RRR Pulmonary: Clear to auscultation bilaterally Abdomen: Soft, non tender, bowel sounds present. Extremities: Tracel lower extremity edema. Wounds: Clean and dry.  No erythema or signs of infection.   Lab Results: CBC: Recent Labs    12/26/22 0032 12/27/22 0038  WBC 10.9* 9.5  HGB 10.7* 11.3*  HCT 32.4* 33.8*  PLT 182 174    BMET:  Recent Labs    12/26/22 0032 12/27/22 0038  NA 131* 132*  K 3.9 4.0  CL 102 103  CO2 22 23  GLUCOSE 115* 101*  BUN 9 17  CREATININE 0.86 0.91  CALCIUM 8.4* 8.2*     PT/INR:  No results for input(s): "LABPROT", "INR" in the last 72 hours.  ABG:  INR: Will add last result for INR, ABG once components are confirmed Will add last 4 CBG results once components are confirmed  Assessment/Plan:  1. CV - SR. On Atenolol 25 mg at hs.  2.  Pulmonary - On room air. Chest tube removed yesterday. CXR this am shows small, stable right apical pneumothorax, atelectasis, chronic blunting at left base.Encourage incentive spirometer. Await final pathology. 3. On Lovenox for DVT prophylaxis 4.  Expected post op blood loss anemia-H and H this am stable at 11.3 and 33.8 5. Discharge  Lelon Huh [facility] 12/27/2022,7:14 AM  Patient seen and examined, agree with above Some basilar atelectasis on left, right lung looks good Hopefully home later today  Viviann Spare C. Dorris Fetch, MD [facility] [phone]

## 2022-12-27 NOTE — Progress Notes (Signed)
Order to discharge pt home.  Discharge instructions/AVS given to patient and reviewed - education provided as needed.  Pt advised to call PCP and/or come back to the hospital if there are any problems. Pt verbalized understanding.    

## 2022-12-27 NOTE — Progress Notes (Signed)
Mobility Specialist Progress Note    12/27/22 1126  Mobility  Activity Ambulated independently in hallway  Level of Assistance Independent  Assistive Device None  Distance Ambulated (ft) 420 ft  Activity Response Tolerated well  Mobility Referral Yes  $Mobility charge 1 Mobility   Pre-Mobility: 65 HR, 94% SpO2 During Mobility: 93 HR, 96% SpO2 Post-Mobility: 83 HR, 93% SpO2  Pt received in bed and agreeable. No complaints on walk. Returned to bed with call bell in reach.    [place] Mobility Specialist  Please Neurosurgeon or Rehab Office at [phone]

## 2022-12-28 ENCOUNTER — Encounter (HOSPITAL_COMMUNITY): Payer: Self-pay | Admitting: Student

## 2023-01-01 LAB — CYTOLOGY - NON PAP

## 2023-01-03 ENCOUNTER — Other Ambulatory Visit: Payer: Self-pay

## 2023-01-03 NOTE — Progress Notes (Signed)
The proposed treatment discussed in conference is for discussion purpose only and is not a binding recommendation.  The patients have not been physically examined, or presented with their treatment options.  Therefore, final treatment plans cannot be decided.  

## 2023-01-14 ENCOUNTER — Ambulatory Visit
Admission: RE | Admit: 2023-01-14 | Discharge: 2023-01-14 | Disposition: A | Discharge status: Home / Self Care | Payer: Medicare PPO | Source: Ambulatory Visit | Attending: Thoracic Surgery (Cardiothoracic Vascular Surgery) | Admitting: Thoracic Surgery (Cardiothoracic Vascular Surgery)

## 2023-01-14 ENCOUNTER — Other Ambulatory Visit: Payer: Self-pay | Admitting: Thoracic Surgery (Cardiothoracic Vascular Surgery)

## 2023-01-14 ENCOUNTER — Encounter: Payer: Self-pay | Admitting: Thoracic Surgery (Cardiothoracic Vascular Surgery)

## 2023-01-14 ENCOUNTER — Telehealth: Payer: Self-pay | Admitting: Internal Medicine

## 2023-01-14 ENCOUNTER — Ambulatory Visit (INDEPENDENT_AMBULATORY_CARE_PROVIDER_SITE_OTHER): Payer: Self-pay | Admitting: Thoracic Surgery (Cardiothoracic Vascular Surgery)

## 2023-01-14 ENCOUNTER — Other Ambulatory Visit: Payer: Self-pay

## 2023-01-14 VITALS — BP 107/68 | HR 96 | Resp 20 | Ht 66.5 in | Wt 152.0 lb

## 2023-01-14 DIAGNOSIS — R918 Other nonspecific abnormal finding of lung field: Secondary | ICD-10-CM | POA: Diagnosis not present

## 2023-01-14 DIAGNOSIS — J9811 Atelectasis: Secondary | ICD-10-CM | POA: Diagnosis not present

## 2023-01-14 DIAGNOSIS — Z902 Acquired absence of lung [part of]: Secondary | ICD-10-CM

## 2023-01-14 DIAGNOSIS — Z09 Encounter for follow-up examination after completed treatment for conditions other than malignant neoplasm: Secondary | ICD-10-CM

## 2023-01-14 DIAGNOSIS — C3491 Malignant neoplasm of unspecified part of right bronchus or lung: Secondary | ICD-10-CM

## 2023-01-14 DIAGNOSIS — I700 Atherosclerosis of aorta: Secondary | ICD-10-CM | POA: Diagnosis not present

## 2023-01-14 DIAGNOSIS — Z9889 Other specified postprocedural states: Secondary | ICD-10-CM | POA: Diagnosis not present

## 2023-01-14 DIAGNOSIS — R911 Solitary pulmonary nodule: Secondary | ICD-10-CM

## 2023-01-14 MED ORDER — GABAPENTIN 300 MG PO CAPS: 300.0000 mg | ORAL_CAPSULE | Freq: Every day | ORAL | 1 refills | Status: DC

## 2023-01-14 NOTE — Progress Notes (Signed)
CBC/CMP for first pt appointment

## 2023-01-14 NOTE — Telephone Encounter (Signed)
Scheduled appt per 2/6 referral. Pt is aware of appt date and time. Pt is aware to arrive 15 mins prior to appt time and to bring and updated insurance card. Pt is aware of appt location.

## 2023-01-14 NOTE — Progress Notes (Signed)
[address]       ,[place] [address]             [phone]    HPI: [name] returns for follow-up after recent wedge resection and node sampling  Donna Dillon is a 77 year old woman with a history of anxiety, reflux, esophageal stricture, hyperlipidemia, prediabetes, and palpitations.  She is a lifelong non-smoker.  She was found to have a groundglass opacity on a CT for evaluation of chest pain.  Repeat CT several months later showed no change in the 9 mm groundglass opacity in the right upper lobe.  After discussion with Dr. Verlee Monte and myself she opted to have a resection.  On 12/25/2022 she underwent navigational bronchoscopy to mark the lesion and then a wedge resection and node sampling.  She did well postoperatively and went home on day 2.  She does have some incisional pain.  She is taking gabapentin 300 mg nightly.  She is taking 1 tramadol during the day and then half of the tablet at night before she goes to bed.  Has not been using acetaminophen or ibuprofen.  She tires easily with activity.  Past Medical History:  Diagnosis Date   Anemia    mild   Anxiety    Colon polyp    hyperplastic   Diverticulosis    Esophageal stricture    GERD (gastroesophageal reflux disease)    no s/s now- off medicines   Hyperlipidemia    Palpitations    Pneumonia    PONV (postoperative nausea and vomiting)    Prediabetes    Tachycardia     Current Outpatient Medications  Medication Sig Dispense Refill   clomiPRAMINE (ANAFRANIL) 50 MG capsule Take 1 capsule (50 mg total) by mouth at bedtime. 90 capsule 2   clonazePAM (KLONOPIN) 1 MG tablet Take 1.5 tablets (1.5 mg total) by mouth at bedtime. 45 tablet 5   Propylene Glycol (SYSTANE BALANCE) 0.6 % SOLN Place 1 drop into both eyes as needed (dry eyes).     traMADol (ULTRAM) 50 MG tablet Take 1 tablet (50 mg total) by mouth every 6 (six) hours as needed for moderate pain. 28 tablet 0   atenolol (TENORMIN)  25 MG tablet TAKE 1 TABLET DAILY FOR BLOOD PRESSURE. (Patient not taking: Reported on 01/14/2023) 90 tablet 3   gabapentin (NEURONTIN) 300 MG capsule Take 1 capsule (300 mg total) by mouth at bedtime. 30 capsule 1   No current facility-administered medications for this visit.    Physical Exam BP 107/68   Pulse 96   Resp 20   Ht 5' 6.5" (1.689 m)   Wt 152 lb (68.9 kg)   LMP 12/09/1994   SpO2 93% Comment: RA  BMI 24.58 kg/m  77 year old woman in no acute distress Alert and oriented x 3 with no focal deficits Lungs clear with equal breath sounds bilaterally Incisions clean dry and intact Cardiac regular rate and rhythm No peripheral edema  Diagnostic Tests: CHEST - 2 VIEW   COMPARISON:  12/27/2022.   FINDINGS: The heart size and mediastinal contours are within normal limits. Right apical nodularity and postop changes appear stable. Linear subsegmental atelectasis at the left base. Normal pulmonary vasculature. No pneumothorax or pleural effusion identified. Aorta is calcified. Osseous structures are intact.   IMPRESSION: Unchanged postop changes and nodularity right apex. No acute cardiopulmonary process.     Electronically Signed   By: Sammie Bench M.D.   On: 01/14/2023 11:08  I personally reviewed the chest x-ray images.  There are postoperative changes from the wedge resection in the right upper lobe.  No concerning findings.  Impression: Donna Dillon is a 76 year old woman with a history of anxiety, reflux, esophageal stricture, hyperlipidemia, prediabetes, and palpitations.  She is a lifelong non-smoker.  She was incidentally found to have a groundglass opacity in the right apex on CT of the chest.  Status post wedge resection and node sampling-T1, N0, stage Ia adenocarcinoma with lepidic spread.  Does not require any adjuvant therapy.  Will refer to Dr. Julien Nordmann for consultation and long-term follow-up.  With surgical standpoint she is doing well.  She  does have some discomfort which is not surprising.  She is only taking small amount of tramadol.  She will call if she needs a refill on that but is trying to wean herself off of it.  Recommend she continue gabapentin for now.  Prescription was written with no refills, so I represcribed with 2 refills.  Advised her not to drive until she is no longer having to take the narcotics during the day.  Otherwise, her activities are unrestricted but she was advised to build into new activities slowly.  Plan: Refer to Dr. Julien Nordmann of oncology for long-term follow-up Return in 6 weeks with PA lateral chest x-ray to check on progress  Melrose Nakayama, MD [facility] [phone]

## 2023-01-15 ENCOUNTER — Ambulatory Visit: Payer: Medicare PPO | Admitting: Thoracic Surgery (Cardiothoracic Vascular Surgery)

## 2023-01-15 ENCOUNTER — Other Ambulatory Visit: Payer: Self-pay | Admitting: Physician Assistant

## 2023-01-17 ENCOUNTER — Other Ambulatory Visit: Payer: Self-pay

## 2023-01-17 MED ORDER — TRAMADOL HCL 50 MG PO TABS: 50.0000 mg | ORAL_TABLET | Freq: Two times a day (BID) | ORAL | 0 refills | Status: DC | PRN

## 2023-01-17 NOTE — Progress Notes (Signed)
Patient contacted the office requesting a refill of her Tramadol. She is s/p RATS Wedge with Dr. Roxan Hockey 12/25/22. She was last seen in the office 01/14/23 and states that she takes one tablet in the morning (sometimes another during the day) and half tablet at night. Spoke with Jadene Pierini, PA who approved another refill of Tramadol 50 mg Q12 hrs #20 with no refills. Prescription called into the pharmacy of patient's choice to [facility]. Patient called and aware of prescription refill.

## 2023-01-20 ENCOUNTER — Inpatient Hospital Stay: Payer: Medicare PPO | Attending: Internal Medicine | Admitting: Internal Medicine

## 2023-01-20 ENCOUNTER — Other Ambulatory Visit: Payer: Self-pay

## 2023-01-20 ENCOUNTER — Inpatient Hospital Stay: Payer: Medicare PPO

## 2023-01-20 VITALS — BP 130/76 | HR 107 | Temp 98.9°F | Resp 16 | Wt 151.3 lb

## 2023-01-20 DIAGNOSIS — C3490 Malignant neoplasm of unspecified part of unspecified bronchus or lung: Secondary | ICD-10-CM

## 2023-01-20 DIAGNOSIS — R911 Solitary pulmonary nodule: Secondary | ICD-10-CM

## 2023-01-20 DIAGNOSIS — C3411 Malignant neoplasm of upper lobe, right bronchus or lung: Secondary | ICD-10-CM

## 2023-01-20 LAB — CBC WITH DIFFERENTIAL (CANCER CENTER ONLY)
Abs Immature Granulocytes: 0.02 10*3/uL (ref 0.00–0.07)
Basophils Absolute: 0.1 10*3/uL (ref 0.0–0.1)
Basophils Relative: 1 %
Eosinophils Absolute: 0.2 10*3/uL (ref 0.0–0.5)
Eosinophils Relative: 3 %
HCT: 41.2 % (ref 36.0–46.0)
Hemoglobin: 13.5 g/dL (ref 12.0–15.0)
Immature Granulocytes: 0 %
Lymphocytes Relative: 27 %
Lymphs Abs: 1.8 10*3/uL (ref 0.7–4.0)
MCH: 29.3 pg (ref 26.0–34.0)
MCHC: 32.8 g/dL (ref 30.0–36.0)
MCV: 89.4 fL (ref 80.0–100.0)
Monocytes Absolute: 0.7 10*3/uL (ref 0.1–1.0)
Monocytes Relative: 10 %
Neutro Abs: 3.9 10*3/uL (ref 1.7–7.7)
Neutrophils Relative %: 59 %
Platelet Count: 265 10*3/uL (ref 150–400)
RBC: 4.61 MIL/uL (ref 3.87–5.11)
RDW: 13.1 % (ref 11.5–15.5)
WBC Count: 6.5 10*3/uL (ref 4.0–10.5)
nRBC: 0 % (ref 0.0–0.2)

## 2023-01-20 LAB — CMP (CANCER CENTER ONLY)
ALT: 11 U/L (ref 0–44)
AST: 8 U/L — ABNORMAL LOW (ref 15–41)
Albumin: 4.2 g/dL (ref 3.5–5.0)
Alkaline Phosphatase: 82 U/L (ref 38–126)
Anion gap: 7 (ref 5–15)
BUN: 13 mg/dL (ref 8–23)
CO2: 29 mmol/L (ref 22–32)
Calcium: 9.7 mg/dL (ref 8.9–10.3)
Chloride: 104 mmol/L (ref 98–111)
Creatinine: 0.88 mg/dL (ref 0.44–1.00)
GFR, Estimated: >60 mL/min (ref >60)
Glucose, Bld: 86 mg/dL (ref 70–99)
Potassium: 3.9 mmol/L (ref 3.5–5.1)
Sodium: 140 mmol/L (ref 135–145)
Total Bilirubin: 0.4 mg/dL (ref 0.3–1.2)
Total Protein: 6.9 g/dL (ref 6.5–8.1)

## 2023-01-20 NOTE — Progress Notes (Signed)
[place] Telephone:(336) [phone]   Fax:(336) [phone]  CONSULT NOTE  REFERRING PHYSICIAN: Dr. Modesto Charon  REASON FOR CONSULTATION:  77 years old white female recently diagnosed with lung cancer  HPI Donna Dillon is a 77 y.o. female with past medical history significant for anemia, anxiety, colon polyps, diverticulosis, GERD, dyslipidemia as well as prediabetes.  She is a never smoker.  The patient mentions that in July 2023 she was complaining of shortness of breath and increased heart rate as well as sleeping issues.  She had CT angiogram of the chest on June 18, 2022 and incidentally it showed 0.9 cm right upper lobe groundglass pulmonary nodule.  The patient was followed by observation and on October 16, 2022 she had CT super D of the chest which showed the stable 0.9 cm right upper lobe groundglass opacity.  She was seen by Dr. Verlee Monte and also referred to Dr. Roxan Hockey.  On December 25, 2022 she underwent flexible video fiberoptic bronchoscopy with robotic assistant and biopsies by Dr. Verlee Monte followed by robotic assisted right thoracoscopy with right upper lobe wedge resection and lymph node dissection by Dr. Roxan Hockey.  The final pathology [id number] ) showed invasive well-differentiated adenocarcinoma, acinar predominant measuring 1.2 cm with no visceral pleural or lymphovascular invasion and the dissected lymph nodes were negative for malignancy. The patient was referred to me today for evaluation and close monitoring of her condition. When seen today she continues to complain of fatigue as well as the baseline tachycardia.  She is followed by Dr. Melford Aase as her primary care physician.  She denied having any chest pain but has occasional shortness of breath with exertion.  She has nausea when she is tired but no significant vomiting, abdominal pain, diarrhea or constipation.  She has no recent weight loss or night sweats.  She has no headache but has blurry  vision. Family history significant for mother with heart disease and hypertension and died from pneumonia.  Father had kidney cancer and died from COPD at age 32. The patient is married has 1 son.  She used to work as a Licensed conveyancer.  She has no history for smoking, alcohol or drug abuse.  HPI  Past Medical History:  Diagnosis Date   Anemia    mild   Anxiety    Colon polyp    hyperplastic   Diverticulosis    Esophageal stricture    GERD (gastroesophageal reflux disease)    no s/s now- off medicines   Hyperlipidemia    Palpitations    Pneumonia    PONV (postoperative nausea and vomiting)    Prediabetes    Tachycardia     Past Surgical History:  Procedure Laterality Date   BREAST BIOPSY Left    BRONCHIAL NEEDLE ASPIRATION BIOPSY  12/25/2022   Procedure: BRONCHIAL NEEDLE ASPIRATION BIOPSIES;  Surgeon: Maryjane Hurter, MD;  Location: [facility] ENDOSCOPY;  Service: Pulmonary;;   CESAREAN SECTION     x1   COLONOSCOPY  2017   eye lift     EYE SURGERY     lasix   FIDUCIAL MARKER PLACEMENT  12/25/2022   Procedure: FIDUCIAL DYE MARKING;  Surgeon: Maryjane Hurter, MD;  Location: [facility] ENDOSCOPY;  Service: Pulmonary;;   INTERCOSTAL NERVE BLOCK Right 12/25/2022   Procedure: INTERCOSTAL NERVE BLOCK;  Surgeon: Melrose Nakayama, MD;  Location: [place];  Service: Thoracic;  Laterality: Right;   NODE DISSECTION Right 12/25/2022   Procedure: NODE DISSECTION;  Surgeon: Melrose Nakayama, MD;  Location: [facility] OR;  Service: Thoracic;  Laterality: Right;   POLYPECTOMY     TONSILLECTOMY     UPPER GASTROINTESTINAL ENDOSCOPY      Family History  Problem Relation Age of Onset   Hypertension Mother    Heart disease Mother    Kidney cancer Father    Emphysema Father        smoked   Colon cancer Paternal Aunt    Colon polyps Neg Hx    Esophageal cancer Neg Hx    Rectal cancer Neg Hx    Stomach cancer Neg Hx     Social History Social History   Tobacco Use   Smoking status: Never     Passive exposure: Past   Smokeless tobacco: Never  Vaping Use   Vaping Use: Never used  Substance Use Topics   Alcohol use: No    Alcohol/week: 0.0 standard drinks of alcohol   Drug use: No    Allergies  Allergen Reactions   Prednisone     Altered mental state   Penicillins Hives and Itching   Asa [Aspirin] Diarrhea   Levaquin [Levofloxacin] Other (See Comments)    Joint Pain   Macrolides And Ketolides Nausea And Vomiting   Paxil [Paroxetine Hcl] Nausea And Vomiting   Tetracyclines & Related Nausea And Vomiting   Zoloft [Sertraline Hcl] Nausea And Vomiting   Sulfa Antibiotics Rash   Trimethoprim Rash    Current Outpatient Medications  Medication Sig Dispense Refill   atenolol (TENORMIN) 25 MG tablet TAKE 1 TABLET DAILY FOR BLOOD PRESSURE. (Patient not taking: Reported on 01/14/2023) 90 tablet 3   clomiPRAMINE (ANAFRANIL) 50 MG capsule Take 1 capsule (50 mg total) by mouth at bedtime. 90 capsule 2   clonazePAM (KLONOPIN) 1 MG tablet Take 1.5 tablets (1.5 mg total) by mouth at bedtime. 45 tablet 5   gabapentin (NEURONTIN) 300 MG capsule Take 1 capsule (300 mg total) by mouth at bedtime. 30 capsule 1   Propylene Glycol (SYSTANE BALANCE) 0.6 % SOLN Place 1 drop into both eyes as needed (dry eyes).     traMADol (ULTRAM) 50 MG tablet Take 1 tablet (50 mg total) by mouth every 12 (twelve) hours as needed for moderate pain. 20 tablet 0   No current facility-administered medications for this visit.    Review of Systems  Constitutional: positive for fatigue Eyes: negative Ears, nose, mouth, throat, and face: negative Respiratory: positive for dyspnea on exertion Cardiovascular: negative Gastrointestinal: positive for nausea Genitourinary:negative Integument/breast: negative Hematologic/lymphatic: negative Musculoskeletal:negative Neurological: negative Behavioral/Psych: negative Endocrine: negative Allergic/Immunologic: negative  Physical Exam  ZOX:WRUEA, healthy, no  distress, well nourished, well developed, and anxious SKIN: skin color, texture, turgor are normal, no rashes or significant lesions HEAD: Normocephalic, No masses, lesions, tenderness or abnormalities EYES: normal, PERRLA, Conjunctiva are pink and non-injected EARS: External ears normal, Canals clear OROPHARYNX:no exudate, no erythema, and lips, buccal mucosa, and tongue normal  NECK: supple, no adenopathy, no JVD LYMPH:  no palpable lymphadenopathy, no hepatosplenomegaly BREAST:not examined LUNGS: clear to auscultation , and palpation HEART: regular rate & rhythm, no murmurs, and no gallops ABDOMEN:abdomen soft, non-tender, normal bowel sounds, and no masses or organomegaly BACK: Back symmetric, no curvature., No CVA tenderness EXTREMITIES:no joint deformities, effusion, or inflammation, no edema  NEURO: alert & oriented x 3 with fluent speech, no focal motor/sensory deficits  PERFORMANCE STATUS: ECOG 1  LABORATORY DATA: Lab Results  Component Value Date   WBC 6.5 01/20/2023   HGB 13.5 01/20/2023   HCT  41.2 01/20/2023   MCV 89.4 01/20/2023   PLT 265 01/20/2023      Chemistry      Component Value Date/Time   NA 132 (L) 12/27/2022 0038   K 4.0 12/27/2022 0038   CL 103 12/27/2022 0038   CO2 23 12/27/2022 0038   BUN 17 12/27/2022 0038   CREATININE 0.91 12/27/2022 0038   CREATININE 0.84 08/20/2022 1514      Component Value Date/Time   CALCIUM 8.2 (L) 12/27/2022 0038   ALKPHOS 54 12/27/2022 0038   AST 11 (L) 12/27/2022 0038   ALT 12 12/27/2022 0038   BILITOT 0.5 12/27/2022 0038       RADIOGRAPHIC STUDIES: DG Chest 2 View  Result Date: 01/14/2023 CLINICAL DATA:  Postop partial right lobectomy EXAM: CHEST - 2 VIEW COMPARISON:  12/27/2022. FINDINGS: The heart size and mediastinal contours are within normal limits. Right apical nodularity and postop changes appear stable. Linear subsegmental atelectasis at the left base. Normal pulmonary vasculature. No pneumothorax or  pleural effusion identified. Aorta is calcified. Osseous structures are intact. IMPRESSION: Unchanged postop changes and nodularity right apex. No acute cardiopulmonary process. Electronically Signed   By: Sammie Bench M.D.   On: 01/14/2023 11:08   DG Chest 2 View  Result Date: 12/27/2022 CLINICAL DATA:  Follow-up right pneumothorax EXAM: CHEST - 2 VIEW COMPARISON:  12/26/2022 FINDINGS: Cardiac shadow is stable. Lungs are well aerated bilaterally. Persistent but decreased right-sided apical pneumothorax is noted. Postsurgical changes in the right apex are seen. Chronic blunting of left costophrenic angle is seen. Associated atelectatic changes are noted. No new infiltrate is seen. IMPRESSION: Decrease in right apical pneumothorax. Stable changes in the left base. Electronically Signed   By: Inez Catalina M.D.   On: 12/27/2022 03:34   DG Chest 1V REPEAT Same Day  Result Date: 12/26/2022 CLINICAL DATA:  Status post partial lobectomy EXAM: CHEST - 1 VIEW SAME DAY COMPARISON:  12/26/2022, 5:15 a.m. FINDINGS: Interval removal of a previously seen right-sided straight bore chest tube. Small, persistent right apical pneumothorax, no greater than 10% in volume. Unchanged small left pleural effusion and associated atelectasis or consolidation. No new airspace opacity. Heart and mediastinum unremarkable osseous structures unremarkable. IMPRESSION: 1. Interval removal of a previously seen right-sided straight bore chest tube. Small, persistent right apical pneumothorax, no greater than 10% in volume. 2. Unchanged small left pleural effusion and associated atelectasis or consolidation. No new airspace opacity. Electronically Signed   By: Delanna Ahmadi M.D.   On: 12/26/2022 12:07   DG Chest Port 1 View  Result Date: 12/26/2022 CLINICAL DATA:  Status post partial lobectomy on the right. EXAM: PORTABLE CHEST 1 VIEW COMPARISON:  Multiple chest x-rays since February 25, 2018 FINDINGS: A right chest tube remains in place.  A small postoperative right apical pneumothorax is stable. No definite left-sided pneumothorax. The cardiomediastinal silhouette is stable. Increased opacity in the left base since yesterday. No other interval changes. IMPRESSION: 1. The right chest tube remains in place with a persistent small postsurgical right apical pneumothorax. 2. Increasing opacity in the left base may represent a small effusion with atelectasis or developing infiltrate. Recommend attention on follow-up. Electronically Signed   By: Donna Dillon III M.D.   On: 12/26/2022 06:53   DG Chest Port 1 View  Result Date: 12/25/2022 CLINICAL DATA:  Status post partial lobectomy EXAM: PORTABLE CHEST 1 VIEW COMPARISON:  Chest x-ray dated December 23, 2022 FINDINGS: Patient is rotated to the left. Postsurgical changes of the right  lung. Pneumomediastinum. Mild bibasilar atelectasis. No large pleural effusion. Possible trace left apical pneumothorax versus artifact related to patient rotation. IMPRESSION: 1. Postsurgical changes of the right lung. 2. Postsurgical pneumomediastinum. 3. Possible trace left apical pneumothorax versus artifact related to patient rotation. Recommend attention on short-term follow-up. These results will be called to the ordering clinician or representative by the Radiologist Assistant, and communication documented in the PACS or [employer]. Electronically Signed   By: Yetta Glassman M.D.   On: 12/25/2022 14:07   DG C-ARM BRONCHOSCOPY  Result Date: 12/25/2022 C-ARM BRONCHOSCOPY: Fluoroscopy was utilized by the requesting physician.  No radiographic interpretation.   DG Chest 2 View  Result Date: 12/23/2022 CLINICAL DATA:  SOB chronic cough EXAM: CHEST - 2 VIEW COMPARISON:  06/18/2022 FINDINGS: The heart size and mediastinal contours are within normal limits. Lungs are hyperinflated suggesting COPD. There is no focal consolidation. No pneumothorax or pleural effusion. Aorta is calcified. The visualized  skeletal structures are unremarkable. Retrocardiac opacity consistent with known hiatal hernia. IMPRESSION: Findings suggest COPD. Hiatal hernia. No active cardiopulmonary process. Electronically Signed   By: Sammie Bench M.D.   On: 12/23/2022 14:53    ASSESSMENT: This is a very pleasant 77 years old white female recently diagnosed with a stage Ia (T1b, N0, M0) non-small cell lung cancer, adenocarcinoma presented with right upper lobe groundglass opacity status post right upper lobe wedge resection with lymph node dissection under the care of Dr. Roxan Hockey on December 25, 2022.    PLAN: I had a lengthy discussion with the patient today about her current disease stage, prognosis and treatment options. I explained to the patient that she had the curable treatment option for her lung cancer with the surgical resection. I explained to the patient that there is no survival benefit for adjuvant systemic chemotherapy, immunotherapy, radiation or targeted therapy. I recommended for the patient to continue on observation with repeat CT scan of the chest in 6 months. I also explained to the patient that the 5-year survival for patient with a stage Ia non-small cell lung cancer is over 80% with no additional treatment after surgery. The patient was advised to start exercising at regular basis. She was advised to call immediately if she has any other concerning symptoms in the interval.  The patient voices understanding of current disease status and treatment options and is in agreement with the current care plan.  All questions were answered. The patient knows to call the clinic with any problems, questions or concerns. We can certainly see the patient much sooner if necessary.  Thank you so much for allowing me to participate in the care of Donna Dillon. I will continue to follow up the patient with you and assist in her care.  The total time spent in the appointment was 60 minutes.  Disclaimer:  This note was dictated with voice recognition software. Similar sounding words can inadvertently be transcribed and may not be corrected upon review.   Donna Dillon January 20, 2023, 1:45 PM

## 2023-02-13 ENCOUNTER — Ambulatory Visit: Payer: Medicare PPO | Admitting: Adult Health

## 2023-02-18 ENCOUNTER — Encounter: Payer: Self-pay | Admitting: Internal Medicine

## 2023-02-18 ENCOUNTER — Ambulatory Visit: Payer: Medicare PPO | Admitting: Internal Medicine

## 2023-02-18 VITALS — BP 100/60 | HR 72 | Temp 97.9°F | Resp 16 | Ht 66.5 in | Wt 151.2 lb

## 2023-02-18 DIAGNOSIS — E559 Vitamin D deficiency, unspecified: Secondary | ICD-10-CM

## 2023-02-18 DIAGNOSIS — E782 Mixed hyperlipidemia: Secondary | ICD-10-CM | POA: Diagnosis not present

## 2023-02-18 DIAGNOSIS — I951 Orthostatic hypotension: Secondary | ICD-10-CM | POA: Diagnosis not present

## 2023-02-18 DIAGNOSIS — Z79899 Other long term (current) drug therapy: Secondary | ICD-10-CM

## 2023-02-18 DIAGNOSIS — R7309 Other abnormal glucose: Secondary | ICD-10-CM | POA: Diagnosis not present

## 2023-02-18 DIAGNOSIS — R0989 Other specified symptoms and signs involving the circulatory and respiratory systems: Secondary | ICD-10-CM | POA: Diagnosis not present

## 2023-02-18 MED ORDER — EZETIMIBE 10 MG PO TABS: ORAL_TABLET | ORAL | 3 refills | Status: DC

## 2023-02-18 MED ORDER — FLUDROCORTISONE ACETATE 0.1 MG PO TABS: ORAL_TABLET | ORAL | 1 refills | Status: DC

## 2023-02-18 NOTE — Patient Instructions (Signed)

## 2023-02-18 NOTE — Progress Notes (Addendum)
Future Appointments  Date Time Provider Department  08/25/2023  3:00 PM Lucky Cowboy, MD [facility]  11/25/2023  4:00 PM Cottle, Steva Ready., MD [facility]  01/28/2024  3:00 PM [facility]  02/05/2024  3:15 PM Si Gaul, MD [facility]  09/06/2024  2:00 PM Lucky Cowboy, MD [facility]      History of Present Illness:       This very nice 77 y.o. MWF  presents for 6 month follow up with HTN, HLD, Pre-Diabetes and Vitamin D Deficiency. Patient has GERD controlled on her meds.        Patient had Robotic partial lung RUL lobectomy by Dr Dorris Fetch for a lung cancer -well diff adenoca Staged Ia (T1b , N0, M0) and is now followed by Dr Arbutus Ped.   Patient is followed with Labile HTN circa 1990's & BP has been controlled at home. Today's BP is low - 100/60 .   Patient dies report frequent episodes of postural lightheadedness and extreme fatigue when her BP runs low.   She relates that her low dose Atenolol 25 mg was d/c'd in hospital post-op for bradycardia. Patient has had no complaints of any cardiac type chest pain, palpitations, dyspnea / orthopnea / PND, dizziness, claudication, or dependent edema.  Hyperlipidemia is not  controlled with diet &  off ezetimibe.  After last labs below new Rx Rosuvastatin  10  mg was sent in. sent in, but  patient is reticent to take statins. Patient denies myalgias or other med SE's. Last Lipids were not at goal :  Lab Results  Component Value Date   CHOL 256 (H) 08/20/2022   HDL 65 08/20/2022   LDLCALC 161 (H) 08/20/2022   TRIG 157 (H) 08/20/2022   CHOLHDL 3.9 08/20/2022     Also, the patient has history of PreDiabetes (A1c 5.8% /2011)  and has had no symptoms of reactive hypoglycemia, diabetic polys, paresthesias or visual blurring.  Last A1c was Normal & at goal :  Lab Results  Component Value Date   HGBA1C 5.4 08/20/2022         Further, the patient also has history of Vitamin D Deficiency ("36" / 2008)  and  supplements vitamin D without any suspected side-effects. Last vitamin D was near goal :  Lab Results  Component Value Date   VD25OH 67 08/20/2022        Current Outpatient Medications  Medication Instructions   atenolol (TENORMIN) 25 mg, Oral, Daily   clomiPRAMINE (ANAFRANIL) 50 mg, Oral, Daily at bedtime   clonazePAM (KLONOPIN) 1.5 mg, Oral, Daily at bedtime   ezetimibe (ZETIA) 10 MG tablet Take  1 tablet  Daily  for Cholesterol   gabapentin (NEURONTIN) 300 mg, Oral, Daily at bedtime   Propylene Glycol (SYSTANE BALANCE) 0.6 % SOLN 1 drop, Both Eyes, As needed   QUEtiapine (SEROQUEL) 25 mg, Oral, Daily at bedtime    Allergies  Allergen Reactions   Prednisone     Altered mental state   Penicillins Hives and Itching   Asa [Aspirin] Diarrhea   Levaquin [Levofloxacin] Other (See Comments)    Joint Pain   Macrolides And Ketolides Nausea And Vomiting   Paxil [Paroxetine Hcl] Nausea And Vomiting   Tetracyclines & Related Nausea And Vomiting   Zoloft [Sertraline Hcl] Nausea And Vomiting   Sulfa Antibiotics Rash   Trimethoprim Rash     Problem list She has ESOPHAGEAL STRICTURE; GERD; PERSONAL HX COLONIC POLYPS; Osteopenia; Hyperlipidemia; Abnormal glucose; Vitamin D deficiency; BMI 23.0-23.9,  adult; FHx: heart disease; OCD (obsessive compulsive disorder); Flying phobia; Essential hypertension; and Pulmonary emphysema (HCC) on their problem list.   Observations/Objective:  BP 100/60   Pulse 72   Temp 97.9 F (36.6 C)   Resp 16   Ht 5' 6.5" (1.689 m)   Wt 151 lb 3.2 oz (68.6 kg)   LMP 12/09/1994   SpO2 99%   BMI 24.04 kg/m   Postural BP   Sitting  BP         P           &   [place] BP    P   HEENT - WNL. Neck - supple.  Chest - Clear equal BS. Cor - Nl HS. RRR w/o sig MGR. PP 1(+). No edema. MS- FROM w/o deformities.  Gait Nl. Neuro -  Nl w/o focal abnormalities.   Assessment and Plan:   1. Labile hypertension  - monitor blood pressure at home.  -  Continue DASH diet.  Reminder to go to the ER if any CP,  SOB, nausea, dizziness, severe HA, changes vision/speech.    - CBC with Differential/Platelet - COMPLETE METABOLIC PANEL WITH GFR - Magnesium - TSH  2. Dysautonomia orthostatic hypotension syndrome  - monitor blood pressure at home.   - fludrocortisone (FLORINEF) 0.1 MG tablet;  Take 1 tablet  2 x /day  for BP Support   Dispense: 180 tablet; Refill: 1  3. Hyperlipidemia, mixed    - Continue diet/meds,  exercise& lifestyle modifications.  - Continue monitor periodic cholesterol/liver & renal functions      - Lipid panel - TSH   4. Abnormal glucose  - Continue diet, exercise  - Lifestyle modifications.  - Monitor appropriate labs   - Hemoglobin A1c - Insulin, random  5. Vitamin D deficiency     - Continue supplementation      - VITAMIN D 25 Hydroxy   6. Medication management  - CBC with Differential/Platelet - COMPLETE METABOLIC PANEL WITH GFR - Magnesium - Lipid panel - TSH - Hemoglobin A1c - Insulin, random - VITAMIN D 25 Hydroxy     Follow Up Instructions:        I discussed the assessment and treatment plan with the patient. The patient was provided an opportunity to ask questions and all were answered. The patient agreed with the plan and demonstrated an understanding of the instructions.       The patient was advised 1 month f/u.     Marinus Maw, MD

## 2023-02-19 ENCOUNTER — Other Ambulatory Visit: Payer: Self-pay | Admitting: Internal Medicine

## 2023-02-19 DIAGNOSIS — I10 Essential (primary) hypertension: Secondary | ICD-10-CM

## 2023-02-19 DIAGNOSIS — R000 Tachycardia, unspecified: Secondary | ICD-10-CM

## 2023-02-19 DIAGNOSIS — E782 Mixed hyperlipidemia: Secondary | ICD-10-CM

## 2023-02-19 LAB — CBC WITH DIFFERENTIAL/PLATELET
Absolute Monocytes: 628 cells/uL (ref 200–950)
Basophils Absolute: 58 cells/uL (ref 0–200)
Basophils Relative: 0.8 %
Eosinophils Absolute: 131 cells/uL (ref 15–500)
Eosinophils Relative: 1.8 %
HCT: 42.5 % (ref 35.0–45.0)
Hemoglobin: 14.1 g/dL (ref 11.7–15.5)
Lymphs Abs: 2329 cells/uL (ref 850–3900)
MCH: 28.8 pg (ref 27.0–33.0)
MCHC: 33.2 g/dL (ref 32.0–36.0)
MCV: 86.7 fL (ref 80.0–100.0)
MPV: 9.9 fL (ref 7.5–12.5)
Monocytes Relative: 8.6 %
Neutro Abs: 4154 cells/uL (ref 1500–7800)
Neutrophils Relative %: 56.9 %
Platelets: 287 10*3/uL (ref 140–400)
RBC: 4.9 10*6/uL (ref 3.80–5.10)
RDW: 12.9 % (ref 11.0–15.0)
Total Lymphocyte: 31.9 %
WBC: 7.3 10*3/uL (ref 3.8–10.8)

## 2023-02-19 LAB — INSULIN, RANDOM: Insulin: 4.1 u[IU]/mL

## 2023-02-19 LAB — COMPLETE METABOLIC PANEL WITH GFR
AG Ratio: 1.7 (calc) (ref 1.0–2.5)
ALT: 12 U/L (ref 6–29)
AST: 9 U/L — ABNORMAL LOW (ref 10–35)
Albumin: 4.7 g/dL (ref 3.6–5.1)
Alkaline phosphatase (APISO): 93 U/L (ref 37–153)
BUN: 10 mg/dL (ref 7–25)
CO2: 27 mmol/L (ref 20–32)
Calcium: 10.1 mg/dL (ref 8.6–10.4)
Chloride: 105 mmol/L (ref 98–110)
Creat: 0.82 mg/dL (ref 0.60–1.00)
Globulin: 2.7 g/dL (calc) (ref 1.9–3.7)
Glucose, Bld: 92 mg/dL (ref 65–99)
Potassium: 4.2 mmol/L (ref 3.5–5.3)
Sodium: 144 mmol/L (ref 135–146)
Total Bilirubin: 0.5 mg/dL (ref 0.2–1.2)
Total Protein: 7.4 g/dL (ref 6.1–8.1)
eGFR: 74 mL/min/{1.73_m2} (ref >=60)

## 2023-02-19 LAB — LIPID PANEL
Cholesterol: 226 mg/dL — ABNORMAL HIGH (ref <200)
HDL: 75 mg/dL (ref >=50)
LDL Cholesterol (Calc): 121 mg/dL (calc) — ABNORMAL HIGH
Non-HDL Cholesterol (Calc): 151 mg/dL (calc) — ABNORMAL HIGH (ref <130)
Total CHOL/HDL Ratio: 3 (calc) (ref <5.0)
Triglycerides: 183 mg/dL — ABNORMAL HIGH (ref <150)

## 2023-02-19 LAB — VITAMIN D 25 HYDROXY (VIT D DEFICIENCY, FRACTURES): Vit D, 25-Hydroxy: 35 ng/mL (ref 30–100)

## 2023-02-19 LAB — MAGNESIUM: Magnesium: 2.3 mg/dL (ref 1.5–2.5)

## 2023-02-19 LAB — TSH: TSH: 2.32 mIU/L (ref 0.40–4.50)

## 2023-02-19 LAB — HEMOGLOBIN A1C
Hgb A1c MFr Bld: 5.5 % of total Hgb (ref <5.7)
Mean Plasma Glucose: 111 mg/dL
eAG (mmol/L): 6.2 mmol/L

## 2023-02-19 MED ORDER — ROSUVASTATIN CALCIUM 20 MG PO TABS: ORAL_TABLET | ORAL | 3 refills | Status: DC

## 2023-02-24 ENCOUNTER — Other Ambulatory Visit: Payer: Self-pay | Admitting: Thoracic Surgery (Cardiothoracic Vascular Surgery)

## 2023-02-24 DIAGNOSIS — C3490 Malignant neoplasm of unspecified part of unspecified bronchus or lung: Secondary | ICD-10-CM

## 2023-02-25 ENCOUNTER — Ambulatory Visit (INDEPENDENT_AMBULATORY_CARE_PROVIDER_SITE_OTHER): Payer: Self-pay | Admitting: Physician Assistant

## 2023-02-25 ENCOUNTER — Ambulatory Visit
Admission: RE | Admit: 2023-02-25 | Discharge: 2023-02-25 | Disposition: A | Discharge status: Home / Self Care | Payer: Medicare PPO | Source: Ambulatory Visit | Attending: Thoracic Surgery (Cardiothoracic Vascular Surgery) | Admitting: Thoracic Surgery (Cardiothoracic Vascular Surgery)

## 2023-02-25 VITALS — BP 119/77 | HR 85 | Resp 18 | Ht 66.0 in | Wt 153.0 lb

## 2023-02-25 DIAGNOSIS — Z902 Acquired absence of lung [part of]: Secondary | ICD-10-CM

## 2023-02-25 DIAGNOSIS — K449 Diaphragmatic hernia without obstruction or gangrene: Secondary | ICD-10-CM | POA: Diagnosis not present

## 2023-02-25 DIAGNOSIS — C3490 Malignant neoplasm of unspecified part of unspecified bronchus or lung: Secondary | ICD-10-CM | POA: Diagnosis not present

## 2023-02-25 DIAGNOSIS — Z9889 Other specified postprocedural states: Secondary | ICD-10-CM | POA: Diagnosis not present

## 2023-02-25 NOTE — Progress Notes (Signed)
      Donna Dillon [address]       [place] [address]             [phone]      Donna Dillon is a 77 year old woman with a history of anxiety, reflux, esophageal stricture, hyperlipidemia, prediabetes, and palpitations.  She is a lifelong non-smoker.  She was found to have a groundglass opacity on a CT for evaluation of chest pain.  Repeat CT several months later showed no change in the 9 mm groundglass opacity in the right upper lobe.  After discussion with Dr. Verlee Monte and myself she opted to have a resection.  On 12/25/2022 she underwent navigational bronchoscopy to mark the lesion and then a wedge resection and node sampling.  She did well postoperatively and went home on day 2.  She was last evaluated by Dr. Roxan Hockey 01/14/2023 at which time  she continued to complain of incisional pain.  She was using gabapentin and tramadol for relief.  She was also tiring easily with activity.  Since her last appointment the patient has been evaluated by Dr. Earlie Server who did not recommend additional treatment for her cancer as surgery was curative.  Overall the patient is doing well.  She has weaned off Tramadol and Gabapentin as she did not wish to get dependent on these.  She is becoming more active but she states prior to surgery she was doing a slow jog and she has not yet resumed that full activity.  She continues to have some numbness along her right chest pain it is much improved.    Current Outpatient Medications  Medication Sig Dispense Refill   clomiPRAMINE (ANAFRANIL) 50 MG capsule Take 1 capsule (50 mg total) by mouth at bedtime. 90 capsule 2   clonazePAM (KLONOPIN) 1 MG tablet Take 1.5 tablets (1.5 mg total) by mouth at bedtime. 45 tablet 5   ezetimibe (ZETIA) 10 MG tablet Take  1 tablet  Daily  for Cholesterol 90 tablet 3   fludrocortisone (FLORINEF) 0.1 MG tablet Take 1 tablet  2 x /day  for BP Support 180 tablet 1   gabapentin (NEURONTIN) 300 MG capsule Take 1 capsule (300 mg  total) by mouth at bedtime. 30 capsule 1   Propylene Glycol (SYSTANE BALANCE) 0.6 % SOLN Place 1 drop into both eyes as needed (dry eyes).     rosuvastatin (CRESTOR) 20 MG tablet Take 1 tablet Daily for Cholesterol 90 tablet 3   No current facility-administered medications for this visit.    Physical Exam:  BP 119/77 (BP Location: Right Arm, Patient Position: Sitting)   Pulse 85   Resp 18   Ht 5\' 6"  (1.676 m)   Wt 153 lb (69.4 kg)   LMP 12/09/1994   SpO2 98% Comment: RA  BMI 24.69 kg/m   Gen: NAD Heart: RRR Lungs: CTA bilaterally Incisions: well healed  Diagnostic Tests:  CXR: IMPRESSION: Stable postoperative changes of the right hemithorax. No evidence of acute airspace opacity.     Electronically Signed   By: Yetta Glassman M.D.   On: 02/25/2023 12:44  A/P:  S/P navigational bronchoscopy to mark the lesion and then a wedge resection and node sampling Stage Ia (T1b, N0, M0) Non Small Cell Lung Cancer, Adenocarcinoma.Marland Kitchen met with Dr. Earlie Server and no further treatment is indicated  Post operative neuralgia- significantly improved, off all pain medications Further surveillance by Dr. Lew Dawes office RTC prn  Ellwood Handler, PA-C [facility] [phone]

## 2023-03-09 NOTE — Progress Notes (Unsigned)
Synopsis: Referred for pulmonary nodule by Unk Pinto, MD  Subjective:   PATIENT ID: Donna Dillon GENDER: female DOB: November 28, 1946, MRN: [id number]  No chief complaint on file.  77yF with history of GERD, esophageal stricture, emphysema referred for pulmonary nodule  She had CTA Chest done for dyspnea and rapid heart rate at [facility]. Revealed 3mm ggo RUL. She has no cough now. Did have productive cough last year but has cleared up. No fever, chills. Has had pneumonia 3-4 times last was 2 years ago.   She has no family history of lung cancer but there were a lot of other cancers in her family  She is a never smoker but did have secondhand smoke exposure growing up. She worked as a Licensed conveyancer. Even worked for 10 years after that in [employer]. No MJ, vaping.    Interval HPI  Underwent wedge resection for RUL adenocarcinoma 1/17, recovering well  Otherwise pertinent review of systems is negative.  Past Medical History:  Diagnosis Date   Anemia    mild   Anxiety    Colon polyp    hyperplastic   Diverticulosis    Esophageal stricture    GERD (gastroesophageal reflux disease)    no s/s now- off medicines   Hyperlipidemia    Palpitations    Pneumonia    PONV (postoperative nausea and vomiting)    Prediabetes    Tachycardia      Family History  Problem Relation Age of Onset   Hypertension Mother    Heart disease Mother    Kidney cancer Father    Emphysema Father        smoked   Colon cancer Paternal Aunt    Colon polyps Neg Hx    Esophageal cancer Neg Hx    Rectal cancer Neg Hx    Stomach cancer Neg Hx      Past Surgical History:  Procedure Laterality Date   BREAST BIOPSY Left    BRONCHIAL NEEDLE ASPIRATION BIOPSY  12/25/2022   Procedure: BRONCHIAL NEEDLE ASPIRATION BIOPSIES;  Surgeon: Maryjane Hurter, MD;  Location: [facility] ENDOSCOPY;  Service: Pulmonary;;   CESAREAN SECTION     x1   COLONOSCOPY  2017   eye lift     EYE SURGERY     lasix    FIDUCIAL MARKER PLACEMENT  12/25/2022   Procedure: FIDUCIAL DYE MARKING;  Surgeon: Maryjane Hurter, MD;  Location: [facility] ENDOSCOPY;  Service: Pulmonary;;   INTERCOSTAL NERVE BLOCK Right 12/25/2022   Procedure: INTERCOSTAL NERVE BLOCK;  Surgeon: Melrose Nakayama, MD;  Location[facility] OR;  Service: Thoracic;  Laterality: Right;   NODE DISSECTION Right 12/25/2022   Procedure: NODE DISSECTION;  Surgeon: Melrose Nakayama, MD;  Location: [facility] OR;  Service: Thoracic;  Laterality: Right;   POLYPECTOMY     TONSILLECTOMY     UPPER GASTROINTESTINAL ENDOSCOPY      Social History   Socioeconomic History   Marital status: Married    Spouse name: Not on file   Number of children: 1   Years of education: Not on file   Highest education level: Not on file  Occupational History   Occupation: Librarian  Tobacco Use   Smoking status: Never    Passive exposure: Past   Smokeless tobacco: Never  Vaping Use   Vaping Use: Never used  Substance and Sexual Activity   Alcohol use: No    Alcohol/week: 0.0 standard drinks of alcohol   Drug use: No   Sexual activity:  Not on file  Other Topics Concern   Not on file  Social History Narrative   Not on file   Social Determinants of Health   Financial Resource Strain: Not on file  Food Insecurity: Not on file  Transportation Needs: Not on file  Physical Activity: Not on file  Stress: Not on file  Social Connections: Not on file  Intimate Partner Violence: Not on file     Allergies  Allergen Reactions   Prednisone     Altered mental state   Penicillins Hives and Itching   Asa [Aspirin] Diarrhea   Levaquin [Levofloxacin] Other (See Comments)    Joint Pain   Macrolides And Ketolides Nausea And Vomiting   Paxil [Paroxetine Hcl] Nausea And Vomiting   Tetracyclines & Related Nausea And Vomiting   Zoloft [Sertraline Hcl] Nausea And Vomiting   Sulfa Antibiotics Rash   Trimethoprim Rash     Outpatient Medications Prior to Visit  Medication Sig  Dispense Refill   clomiPRAMINE (ANAFRANIL) 50 MG capsule Take 1 capsule (50 mg total) by mouth at bedtime. 90 capsule 2   clonazePAM (KLONOPIN) 1 MG tablet Take 1.5 tablets (1.5 mg total) by mouth at bedtime. 45 tablet 5   ezetimibe (ZETIA) 10 MG tablet Take  1 tablet  Daily  for Cholesterol 90 tablet 3   fludrocortisone (FLORINEF) 0.1 MG tablet Take 1 tablet  2 x /day  for BP Support 180 tablet 1   gabapentin (NEURONTIN) 300 MG capsule Take 1 capsule (300 mg total) by mouth at bedtime. 30 capsule 1   Propylene Glycol (SYSTANE BALANCE) 0.6 % SOLN Place 1 drop into both eyes as needed (dry eyes).     rosuvastatin (CRESTOR) 20 MG tablet Take 1 tablet Daily for Cholesterol 90 tablet 3   No facility-administered medications prior to visit.       Objective:   Physical Exam:  General appearance: 77 y.o., female, NAD, conversant, female, NAD, conversant  Eyes: anicteric sclerae; PERRL, tracking appropriately HENT: NCAT; MMM Neck: Trachea midline; no lymphadenopathy, no JVD Lungs: CTAB, no crackles, no wheeze, with normal respiratory effort CV: RRR, no murmur  Abdomen: Soft, non-tender; non-distended, BS present  Extremities: No peripheral edema, warm Skin: Normal turgor and texture; no rash Psych: Appropriate affect Neuro: Alert and oriented to person and place, no focal deficit     There were no vitals filed for this visit.      on RA BMI Readings from Last 3 Encounters:  02/25/23 24.69 kg/m  02/18/23 24.04 kg/m  01/20/23 24.05 kg/m   Wt Readings from Last 3 Encounters:  02/25/23 153 lb (69.4 kg)  02/18/23 151 lb 3.2 oz (68.6 kg)  01/20/23 151 lb 4.8 oz (68.6 kg)     CBC    Component Value Date/Time   WBC 7.3 02/18/2023 1551   RBC 4.90 02/18/2023 1551   HGB 14.1 02/18/2023 1551   HGB 13.5 01/20/2023 1333   HCT 42.5 02/18/2023 1551   PLT 287 02/18/2023 1551   PLT 265 01/20/2023 1333   MCV 86.7 02/18/2023 1551   MCH 28.8 02/18/2023 1551   MCHC 33.2 02/18/2023 1551   RDW 12.9  02/18/2023 1551   LYMPHSABS 2,329 02/18/2023 1551   MONOABS 0.7 01/20/2023 1333   EOSABS 131 02/18/2023 1551   BASOSABS 58 02/18/2023 1551      Chest Imaging: TA Chest 06/18/22 reviewed by me with 9mm ggo, no concerning adenopathy and no clear emphysema  CT Super D 10/18/22 reviewed by me stable RUL 54mm  ggo. No suspicious adenopathy  Pulmonary Functions Testing Results:    Latest Ref Rng & Units 10/30/2022   11:21 AM  PFT Results  FVC-Pre L 2.79   FVC-Predicted Pre % 91   FVC-Post L 2.83   FVC-Predicted Post % 92   Pre FEV1/FVC % % 60   Post FEV1/FCV % % 65   FEV1-Pre L 1.68   FEV1-Predicted Pre % 73   FEV1-Post L 1.84   DLCO uncorrected ml/min/mmHg 22.86   DLCO UNC% % 111   DLCO corrected ml/min/mmHg 22.86   DLCO COR %Predicted % 111   DLVA Predicted % 107   TLC L 6.09   TLC % Predicted % 113   RV % Predicted % 154    Mild obstruction, air trapping    Assessment & Plan:   # RUL 68mm GGO: Calculators with range of 10-17% chance malignancy over next 2-4 years but even though nonsmoker I still think better chance than not that it's a low grade adenocarcinoma with limited likelihood of progression in near term. Discussed options of robotic bronch under general with risks of PTX 1% at our institution, very low risk of respiratory failure and bleeding, referral for combination/single anesthetic case with cardiothoracic surgery for possible wedge vs lobectomy, watchful waiting with surveillance CT Chest. It doesn't sound like she'd be completely reassured about the nature of this lesion with a negative biopsy.   # possible COPD related to secondhand smoke exposure  # Mild obstruction, air trapping  Plan: - I recommended either CT surveillance or proceeding directly to dye-marking/resection. I would say diagnostic yield 50-60% of biopsy alone with this ggo lesion and neither negative nor nondiagnostic result are helpful. Her lung function makes it look promising she has  sufficient reserve for resection and this is as good condition as she'll ever be in to be able to tolerate a surgery. She is still weighing these choices and will let us know what she prefers.  - declines inhalers for her relatively asymptomatic mild obstruction     Maryjane Hurter, MD [facility] 03/09/2023 4:42 PM

## 2023-03-11 ENCOUNTER — Encounter: Payer: Self-pay | Admitting: Student

## 2023-03-11 ENCOUNTER — Ambulatory Visit: Payer: Medicare PPO | Admitting: Student

## 2023-03-11 VITALS — BP 102/58 | HR 68 | Temp 98.7°F | Ht 66.0 in | Wt 152.0 lb

## 2023-03-11 DIAGNOSIS — C3491 Malignant neoplasm of unspecified part of right bronchus or lung: Secondary | ICD-10-CM

## 2023-03-11 DIAGNOSIS — J449 Chronic obstructive pulmonary disease, unspecified: Secondary | ICD-10-CM

## 2023-03-11 NOTE — Patient Instructions (Addendum)
-   Happy to see you again if you have any trouble breathing or cough! - RSV, flu shot in October of 2024 - covid-19 booster probably reasonable if you're up for it some time in fall

## 2023-03-13 ENCOUNTER — Encounter: Payer: Self-pay | Admitting: Internal Medicine

## 2023-03-13 ENCOUNTER — Ambulatory Visit: Payer: Medicare PPO | Admitting: Internal Medicine

## 2023-03-13 VITALS — BP 100/60 | HR 66 | Temp 97.9°F | Resp 17 | Ht 66.5 in | Wt 155.2 lb

## 2023-03-13 DIAGNOSIS — R0989 Other specified symptoms and signs involving the circulatory and respiratory systems: Secondary | ICD-10-CM

## 2023-03-13 NOTE — Progress Notes (Signed)
Future Appointments  Date Time Provider Department  03/13/2023  3:30 PM Lucky Cowboy, MD [facility]  05/15/2023  4:30 PM Cottle, Steva Ready., MD [facility]  07/23/2023  3:30 PM Si Gaul, MD [facility]  08/25/2023  3:00 PM Lucky Cowboy, MD [facility]    History of Present Illness:      This very nice 77 y.o. MWF  presents for 3 week  follow up of labile HTN  with last OV finding significant postural orthostasis -   Sitting  BP     100/60    P 72          &   Standing BP 88/60    P 100 - for which patient was rx'd Florinef. She did not fill the RX and presents with a list of low normal BPs.    Current Outpatient Medications on File Prior to Visit  Medication Sig   clomiPRAMINE (ANAFRANIL) 50 MG capsule Take 1 capsule  at bedtime.   clonazePAM   1 MG tablet Take 1.5 tablets (at bedtime.   Ezetimibe  10 MG tablet Take  1 tablet  Daily  for Cholesterol   Gabapentin  300 MG capsule Take 1 capsule at bedtime.   SYSTANE BALANCE 0.6 % SOLN Place 1 drop into both eyes as needed (dry eyes).     Allergies  Allergen Reactions   Prednisone     Altered mental state   Penicillins Hives and Itching   Asa [Aspirin] Diarrhea   Levaquin [Levofloxacin] Other (See Comments)    Joint Pain   Macrolides And Ketolides Nausea And Vomiting   Paxil [Paroxetine Hcl] Nausea And Vomiting   Tetracyclines & Related Nausea And Vomiting   Zoloft [Sertraline Hcl] Nausea And Vomiting   Sulfa Antibiotics Rash   Trimethoprim Rash     Problem list She has ESOPHAGEAL STRICTURE; GERD; PERSONAL HX COLONIC POLYPS; Osteopenia; Hyperlipidemia; Abnormal glucose; Vitamin D deficiency; BMI 23.0-23.9, adult; FHx: heart disease; OCD (obsessive compulsive disorder); Flying phobia; Essential hypertension; Pulmonary emphysema (HCC) - per CXR 12/2018; Lung nodule; S/P partial lobectomy of lung; and Adenocarcinoma of upper lobe of right lung on their problem list.   Observations/Objective:  BP 100/60   Pulse 66    Temp 97.9 F (36.6 C)   Resp 17   Ht 5' 6.5" (1.689 m)   Wt 155 lb 3.2 oz (70.4 kg)   LMP 12/09/1994   SpO2 96%   BMI 24.67 kg/m   Postural                Sitting BP 109/41    P 65            &        Standing BP 115/83      P 72  HEENT - WNL. Neck - supple.  Chest - Clear equal BS. Cor - Nl HS. RRR w/o sig MGR. PP 1(+). No edema. MS- FROM w/o deformities.  Gait Nl. Neuro -  Nl w/o focal abnormalities.  Assessment and Plan:   1. Labile hypertension  - advised continue monitoring BP & also monitor concomitant Standing BP  & P    Follow Up Instructions:        I discussed the assessment and treatment plan with the patient. The patient was provided an opportunity to ask questions and all were answered. The patient agreed with the plan and demonstrated an understanding of the instructions.       The patient was advised  to call back or seek an in-person evaluation if the symptoms worsen or if the condition fails to improve as anticipated.    Kirtland Bouchard, MD

## 2023-03-15 ENCOUNTER — Encounter: Payer: Self-pay | Admitting: Internal Medicine

## 2023-03-29 ENCOUNTER — Other Ambulatory Visit: Payer: Self-pay | Admitting: Internal Medicine

## 2023-03-29 DIAGNOSIS — R000 Tachycardia, unspecified: Secondary | ICD-10-CM

## 2023-03-29 DIAGNOSIS — I10 Essential (primary) hypertension: Secondary | ICD-10-CM

## 2023-03-30 ENCOUNTER — Other Ambulatory Visit: Payer: Self-pay | Admitting: Internal Medicine

## 2023-03-30 DIAGNOSIS — E782 Mixed hyperlipidemia: Secondary | ICD-10-CM

## 2023-03-30 MED ORDER — EZETIMIBE 10 MG PO TABS: ORAL_TABLET | ORAL | 3 refills | Status: DC

## 2023-05-15 ENCOUNTER — Encounter: Payer: Self-pay | Admitting: Psychiatry

## 2023-05-15 ENCOUNTER — Ambulatory Visit (INDEPENDENT_AMBULATORY_CARE_PROVIDER_SITE_OTHER): Payer: Medicare PPO | Admitting: Psychiatry

## 2023-05-15 DIAGNOSIS — F3342 Major depressive disorder, recurrent, in full remission: Secondary | ICD-10-CM

## 2023-05-15 DIAGNOSIS — F411 Generalized anxiety disorder: Secondary | ICD-10-CM | POA: Diagnosis not present

## 2023-05-15 DIAGNOSIS — F422 Mixed obsessional thoughts and acts: Secondary | ICD-10-CM | POA: Diagnosis not present

## 2023-05-15 DIAGNOSIS — F5105 Insomnia due to other mental disorder: Secondary | ICD-10-CM

## 2023-05-15 MED ORDER — CLOMIPRAMINE HCL 50 MG PO CAPS: 50.0000 mg | ORAL_CAPSULE | Freq: Every day | ORAL | 2 refills | Status: DC

## 2023-05-15 MED ORDER — CLONAZEPAM 1 MG PO TABS
1.5000 mg | ORAL_TABLET | Freq: Every day | ORAL | 5 refills | Status: DC
Start: 2023-05-15 — End: 2023-11-25

## 2023-05-15 MED ORDER — ATENOLOL 25 MG PO TABS: 25.0000 mg | ORAL_TABLET | Freq: Every day | ORAL | 0 refills | Status: DC

## 2023-05-15 MED ORDER — QUETIAPINE FUMARATE 25 MG PO TABS: 25.0000 mg | ORAL_TABLET | Freq: Every day | ORAL | 1 refills | Status: DC

## 2023-05-15 NOTE — Progress Notes (Signed)
Donna Dillon [id number] 13-Oct-1946 77 y.o.  Subjective:   Patient ID:  Donna Dillon is a 77 y.o. (DOB Jul 04, 1946) female.  Chief Complaint:  Chief Complaint  Patient presents with   Follow-up   Depression   Anxiety    Depression        Associated symptoms include fatigue.  Associated symptoms include no decreased concentration, no appetite change and no suicidal ideas.  Past medical history includes anxiety.   Anxiety Symptoms include nervous/anxious behavior and shortness of breath. Patient reports no confusion, decreased concentration, dizziness or suicidal ideas.     Donna Dillon presents to the office today for follow-up of OCD.    seen December 2020.  No meds were changed.  seen Apr 10, 2020.  The following is noted: She is markedly worse. "falling apart".  More fatigued since dilation of esophagus.  January more dry eyes.  Hair falling out.  TSH is normal.   More sleep problems since Feb. Called and increased clonazepam to 2 mg HS bc also the insomnia was causing more anxiety.  Now also scared of taking the clonazepam.   Added trazodone 100 mg and it didn't help sleep. More obsessed with heart rate being elevated to 100 bpm.   Can't work DT anxiety and insomnia and fear over her heart. Scared.  She was ruminating. Plan:Increase atenolol for anxiety and tachycardia to 25 mg daily. DC trazodone Seroquel 25-50 mg for sleep Needs increased BZ .  She is afraid increasing the clonazepam but may be willing to take Xanax during the day.  Stay out of work this week. Disc option of Day treatment bc severity of sx and she's afraid of being alone. Option call Behavioral Health Assessment at the main [facility] phone.  Tell them I referred you to the Day treatment program. Return to office in 4 days.  04/14/2020 appointment the following is noted: Took 37.5 mg Seroquel Monday with 2 mg Klonopin and awoke in 3 hour. Afterwards usually took Seroquel 50 mg since then with Klonopin.   Lost another 1# and total of 11#/3 weeks.  Eating after taking alprazolam.  Can't quilt or read or focus on TV.  Heart rate is better after increase atenolol to 25 mg daily. Now 5-6 hours of sleep. [name]. Plan: Start clomipramine 25 mg 1 capsule for 10 days, then 2 each night or 50 mg nightly. Continue Klonopin 2 mg HS Increase Seroquel 75 mg for sleep and anxiety and rumination Needs increased BZ .  She is afraid increasing the clonazepam but may be willing to take Xanax during the day.  Ok continue Xanax daytime for anxiety and allow her to eat..  Stay out of work right now.  Is on medical leave. Disc option of Day treatment bc severity of sx and she's afraid of being alone. Option call Behavioral Health Assessment at the main [facility] phone.  Tell them I referred you to the Day treatment program.  04/21/2020 urgent appointment, the following is noted: Generally sleeping better with quetiapine 75 mg HS.  Getting hangover.  Has to take Xanax to eat.  Losing weight 10#.  Just wants to lay down.  Feels shakey and weak. Did get restarted on clomipramine 25 mg HS.  Fatigue and hard to concentrate.   Plan: Continue medical leave until May 29, 2020.   The following med decisions were made: Continue clomipramine 25 mg 1 capsule for 10 days, then 2 each night  or 50 mg nightly. Continue Klonopin 2 mg HS Increase Seroquel 75 mg for sleep and anxiety and rumination   05/03/2020 appointment urgently with the following noted:  Doesn't feel better yet.  Seroquel 75 gave her hangover.  With 50 mg usually 8 hours but some EMA esp if anxiety is bad.  Lays in bed until noon bc doesn't want to do anything  In bed to escape. When first started clomipramine 25 had sig insomnia.  At 50 mg now Appetite still poor and lost 10#.  Forcing food for 6 weeks. Idleness is not good for worry but can't focus well enough to read as much as she would usually otherwise  bc anxiety interferes. Taking alprazolam 0.5 mg in pm to help eat dinner.  Getting from [place]. No med changes  05/18/20 appt with the following noted:  Another urgent appt DT severity of anxiety. On clomipramine 50 mg since about 5/14. Appetite not better yet.  Lost total 12-13#.   Not depressed. Feels sleepy.  Always tired.  Wonders if there's medical cause. Sleeping a lot with Seroquel 50 mg HS. Notices less anxiety by "a little".   A little more obsessions since being at home but not severe. Not currently physically able to do the job and embarrassed about being out of work.  Disc concerns about her age and work and wondering about retirement from it fully.  No med changes.  06/07/2020 appointment with the following noted: Sleeping well with clonazepam 2 mg HS and quetiapine 50 mg HS. Still on clomipramine 50 since 04/21/20. Still exhausted from going to grocery store.  Not mentally exhausted but is physically.   Has remained on medical leave but not covered by FMLA bc not working FT. Decided to resign.  Doesn't feel able to do it yet.  Hopes to get to volunteer in the fall. Naps a good deal. Plan: Continue clomipramine 50 mg nightly (started about 5/14) Continue Klonopin 2 mg HS Continue Seroquel 25-50 mg for sleep and anxiety and rumination .  Try reducing as soon as she can to help energy. Ok continue Xanax daytime for anxiety and allow her to eat..  Meds won't work if you don't get enough protein.  08/10/20 appt with the following noted:  Nothing to do but gave job up bc didn't think she could do it physically. Did clean her house well.   Clomipramine has kicked in but hard time bc nothing to divert her attention.   No anxiety until married. After 5-6 years later got osbessions.  Never compulsions. Not depressed or despairing anymore. Thinking of 12 hour a week job and needs it to get out of the house.  Needs stimulation and activity. Plan: no med changes.  11/13/2020  appointment with the following noted: Stopped quetiapine a week ago.  Sleep is OK with a little more awakening but not severe.   Some chronic OCD but minimal some chronic anxiety but manageable.  Not depressed.  Sleep is adequate Son Gaynell Face doing well with promotion at [employer] managing 20 people. Surprisingly well. Still has compulsions including night eating.  Married since 2016.  Hasn't travelled lately but wants to.  Enjoys [place]. [place].  Has a dog. Plan no med changes:   03/15/2021 appointment with the following noted: A lot of changes in the last year.  Health decline in physical ability to walk.  No particular reason. Lost job and friends at the job.  Lost dog of 12 years.  B died in  January, lived in [place].   Was going to volunteer but didn't DT covid. Dx advancing COPD despite not smoking.  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 Clonazepam helps her and needs the 2 mg dose.  Afraid to reduce it.  Insurance won't cover. Needed to increase clonazepam to 2 mg HS 1 year ago and sleeping fine. No seroquel needed. Not depressed. Son eccentric but genius. Continue clomipramine 50 mg nightly (started about 04/2013) Continue Klonopin 2 mg HS for now but hope to gradually reduce it with goal of about 1 mg eventually.  Unlikely to be able to stop it. Disc way to reduce it in detail. No  med changes today.   09/13/2021 appointment with the following noted: Still can't find things to do or places to volunteer.  Need something to do.   Son Gaynell Face works for [employer] and works from home. OCD manageable but not 100% gone. Disc concerns about heart rate.  It's less than 100. Not exercising, but used to. EKG yearly but still worries her. Patient reports stable mood and denies depressed or irritable moods.  Patient denies any recent difficulty with anxiety.  Patient denies difficulty with sleep initiation or maintenance. Denies appetite disturbance.  Patient reports that energy  and motivation have been good.  Patient denies any difficulty with concentration.  Patient denies any suicidal ideation. Plan no med changes  05/14/2022 appointment with the following noted: No med changes. Chronic tiredness to some degree.  Sleep good.   Gets SOB with exertion.  More physical problems.  Dental problems, gingivitis.  Some leg swelling. Sleep well.  Not very active.  Pending card referral.  Not sleepy in the AM Son getting divorced but he's not good socially.  Pt making him dinner.  11/13/22 appt noted: Some med issues with clonazepam RF.  Was still getting 2 mg size. Taking clonazepam 1.5 mg HS and clomipramine 50 mg HS and a little quetiapine.   Quetiapine helps sleep at 12.5 mg HS. Has had some health problems and including pulm nodule and needs Bx.  Pending and likely lobectomy.  Disc stress of it and doesn't feel she can reduce the clonazepam. Feels SOB and tired.  Coughs in the AM when gets up. Not sig depressed.    05/15/23 appt noted: Taking clonazepam 1.5 mg HS and clomipramine 50 mg HS and a little quetiapine.   Quetiapine helps sleep at 12.5 mg HS. Quetiapine helps sleep a lot.  Doesn't sleep well enough with clonazepam alone.   Had a rough time bc Had lung 9 mm adenocarcinoma resected in January.   Couldn't sleep while in the hospital.  Get "crazy" if can't sleep.  Was very stressful.  Doesn't feels she has fully recovered.  Hs some numbness from the nerve block.   Has not had energy to do things but so bored and needs more activity.   Some fears of recurrence.   Tiredness gradually better but still a problems. SOB with exertion and coughing.  Worries over her heart and lungs.        Past Psychiatric Medication Trials:  Clomipramine,   Poor response SSRIs  Trazodone 100 NR Seroquel 75 hangover Clonazepam 1-2 mg HS  Review of Systems:  Review of Systems  Constitutional:  Positive for fatigue. Negative for appetite change and unexpected weight change.        Lost 10-12 # in 3 weeks  HENT:  Positive for dental problem.   Respiratory:  Positive for shortness of  breath.   Cardiovascular:  Positive for leg swelling.  Gastrointestinal:  Negative for abdominal pain.  Neurological:  Negative for dizziness and tremors.  Psychiatric/Behavioral:  Negative for agitation, behavioral problems, confusion, decreased concentration, dysphoric mood, hallucinations, self-injury, sleep disturbance and suicidal ideas. The patient is nervous/anxious. The patient is not hyperactive.     Medications: I have reviewed the patient's current medications.  Current Outpatient Medications  Medication Sig Dispense Refill   atenolol (TENORMIN) 25 MG tablet Take 1 tablet (25 mg total) by mouth daily. 90 tablet 0   ezetimibe (ZETIA) 10 MG tablet Take  1 tablet  Daily  for Cholesterol 90 tablet 3   gabapentin (NEURONTIN) 300 MG capsule Take 1 capsule (300 mg total) by mouth at bedtime. 30 capsule 1   Propylene Glycol (SYSTANE BALANCE) 0.6 % SOLN Place 1 drop into both eyes as needed (dry eyes).     clomiPRAMINE (ANAFRANIL) 50 MG capsule Take 1 capsule (50 mg total) by mouth at bedtime. 90 capsule 2   clonazePAM (KLONOPIN) 1 MG tablet Take 1.5 tablets (1.5 mg total) by mouth at bedtime. 45 tablet 5   QUEtiapine (SEROQUEL) 25 MG tablet Take 1 tablet (25 mg total) by mouth at bedtime. 90 tablet 1   No current facility-administered medications for this visit.    Medication Side Effects: None, dry  Allergies:  Allergies  Allergen Reactions   Prednisone     Altered mental state   Penicillins Hives and Itching   Asa [Aspirin] Diarrhea   Levaquin [Levofloxacin] Other (See Comments)    Joint Pain   Macrolides And Ketolides Nausea And Vomiting   Paxil [Paroxetine Hcl] Nausea And Vomiting   Tetracyclines & Related Nausea And Vomiting   Zoloft [Sertraline Hcl] Nausea And Vomiting   Sulfa Antibiotics Rash   Trimethoprim Rash    Past Medical History:  Diagnosis Date    Anemia    mild   Anxiety    Colon polyp    hyperplastic   Diverticulosis    Esophageal stricture    GERD (gastroesophageal reflux disease)    no s/s now- off medicines   Hyperlipidemia    Palpitations    Pneumonia    PONV (postoperative nausea and vomiting)    Prediabetes    Tachycardia     Family History  Problem Relation Age of Onset   Hypertension Mother    Heart disease Mother    Kidney cancer Father    Emphysema Father        smoked   Colon cancer Paternal Aunt    Colon polyps Neg Hx    Esophageal cancer Neg Hx    Rectal cancer Neg Hx    Stomach cancer Neg Hx     Social History   Socioeconomic History   Marital status: Married    Spouse name: Not on file   Number of children: 1   Years of education: Not on file   Highest education level: Not on file  Occupational History   Occupation: Librarian  Tobacco Use   Smoking status: Never    Passive exposure: Past   Smokeless tobacco: Never  Vaping Use   Vaping Use: Never used  Substance and Sexual Activity   Alcohol use: No    Alcohol/week: 0.0 standard drinks of alcohol   Drug use: No   Sexual activity: Not on file  Other Topics Concern   Not on file  Social History Narrative   Not on file   Social Determinants of Health  Financial Resource Strain: Not on file  Food Insecurity: Not on file  Transportation Needs: Not on file  Physical Activity: Not on file  Stress: Not on file  Social Connections: Not on file  Intimate Partner Violence: Not on file    Past Medical History, Surgical history, Social history, and Family history were reviewed and updated as appropriate.   Son has severe anxiety and is treated also.  Please see review of systems for further details on the patient's review from today.   Objective:   Physical Exam:  LMP 12/09/1994   Physical Exam Constitutional:      General: She is not in acute distress.    Appearance: She is well-developed.  Musculoskeletal:         General: No deformity.  Neurological:     Mental Status: She is alert and oriented to person, place, and time.     Motor: No tremor.     Coordination: Coordination normal.     Gait: Gait normal.  Psychiatric:        Attention and Perception: She is attentive. She does not perceive auditory hallucinations.        Mood and Affect: Mood is anxious. Mood is not depressed. Affect is not labile, blunt, angry, tearful or inappropriate.        Speech: Speech normal. Speech is not slurred.        Behavior: Behavior normal.        Thought Content: Thought content normal. Thought content is not delusional. Thought content does not include homicidal or suicidal ideation. Thought content does not include suicidal plan.        Cognition and Memory: Cognition normal.        Judgment: Judgment normal.     Comments: Insight intact. Residual obsessions if not busy.. No auditory or visual hallucinations. No delusions.  No rumination  Neat .  No sedation. Pleasant and talkative, fluent      Lab Review:     Component Value Date/Time   NA 144 02/18/2023 1551   K 4.2 02/18/2023 1551   CL 105 02/18/2023 1551   CO2 27 02/18/2023 1551   GLUCOSE 92 02/18/2023 1551   BUN 10 02/18/2023 1551   CREATININE 0.82 02/18/2023 1551   CALCIUM 10.1 02/18/2023 1551   PROT 7.4 02/18/2023 1551   ALBUMIN 4.2 01/20/2023 1333   AST 9 (L) 02/18/2023 1551   AST 8 (L) 01/20/2023 1333   ALT 12 02/18/2023 1551   ALT 11 01/20/2023 1333   ALKPHOS 82 01/20/2023 1333   BILITOT 0.5 02/18/2023 1551   BILITOT 0.4 01/20/2023 1333   GFRNONAA >60 01/20/2023 1333   GFRNONAA 77 02/15/2021 1546   GFRAA 89 02/15/2021 1546       Component Value Date/Time   WBC 7.3 02/18/2023 1551   RBC 4.90 02/18/2023 1551   HGB 14.1 02/18/2023 1551   HGB 13.5 01/20/2023 1333   HCT 42.5 02/18/2023 1551   PLT 287 02/18/2023 1551   PLT 265 01/20/2023 1333   MCV 86.7 02/18/2023 1551   MCH 28.8 02/18/2023 1551   MCHC 33.2 02/18/2023 1551    RDW 12.9 02/18/2023 1551   LYMPHSABS 2,329 02/18/2023 1551   MONOABS 0.7 01/20/2023 1333   EOSABS 131 02/18/2023 1551   BASOSABS 58 02/18/2023 1551    11/2022 EKG was normal.  .res Assessment: Plan:    Mixed obsessional thoughts and acts - Plan: clomiPRAMINE (ANAFRANIL) 50 MG capsule, clonazePAM (KLONOPIN) 1 MG tablet  Generalized anxiety  disorder - Plan: clomiPRAMINE (ANAFRANIL) 50 MG capsule, clonazePAM (KLONOPIN) 1 MG tablet, QUEtiapine (SEROQUEL) 25 MG tablet  Insomnia due to mental condition - Plan: clonazePAM (KLONOPIN) 1 MG tablet, QUEtiapine (SEROQUEL) 25 MG tablet  Major depression, recurrent, full remission (HCC)    Marked  worsening of anxiety and depresssion without obvious worsening of OCD after STOPPING CLOMIPRAMINE ACCIDENTALLY  in 2020.  40 min appt with 20 min counseling around trauma of cancer dx.    But cannot sleep without clonazepam .   Disc risks TCA including cardiac and risk increasing resting heart rate.  Last EKG ok. Will renew her atenolol per PCP for this.  We discussed the short-term risks associated with benzodiazepines including sedation and increased fall risk among others.  Discussed long-term side effect risk including dependence, potential withdrawal symptoms, and the potential eventual dose-related risk of dementia.  But recent studies from 2020 dispute this association between benzodiazepines and dementia risk. Newer studies in 2020 do not support an association with dementia. Disc her fears of addiction.  Wants to eventually consider tapering off of it using the quetiapine to protect her sleep.  Disc her fears of dose reduction but it is time before she has SE related to age and the BZ.  Extensive discussion.  Supportive therapy and CBT on relapse prevention and what to do to deal with her job and anxiety. New stressor of pulm nodule and surgery for it.  Helped her process her thoughts about it again.  Disc trauma of cancer dx.   Continue  clomipramine 50 mg nightly (started about 04/2013)  Rec trial of reducing Klonopin to 1.5 mg HS bc of age increase risk of SE. Unlikely to be able to stop it. But trial reduce to 1.25 mg recommended.  Continue Seroquel 12.5 mg - 25 mg HS for TR insomnia.  Asks I renew atenolol 25 mg bc lost her bottle  FU 6 mos   Meredith Staggers, MD, DFAPA   Please see After Visit Summary for patient specific instructions.  Future Appointments  Date Time Provider Department Center  07/21/2023 11:00 AM [facility] None  07/23/2023  3:30 PM Si Gaul, MD [facility] None  08/25/2023  3:00 PM Lucky Cowboy, MD [facility] None    No orders of the defined types were placed in this encounter.     -------------------------------

## 2023-07-09 ENCOUNTER — Telehealth: Payer: Self-pay | Admitting: Internal Medicine

## 2023-07-09 NOTE — Telephone Encounter (Signed)
Called patient regarding upcoming August appointments, left a voicemail.

## 2023-07-21 ENCOUNTER — Inpatient Hospital Stay: Payer: Medicare PPO | Attending: Internal Medicine

## 2023-07-21 ENCOUNTER — Ambulatory Visit (HOSPITAL_COMMUNITY): Payer: Medicare PPO

## 2023-07-21 DIAGNOSIS — Z85118 Personal history of other malignant neoplasm of bronchus and lung: Secondary | ICD-10-CM | POA: Insufficient documentation

## 2023-07-21 DIAGNOSIS — Z08 Encounter for follow-up examination after completed treatment for malignant neoplasm: Secondary | ICD-10-CM | POA: Insufficient documentation

## 2023-07-23 ENCOUNTER — Ambulatory Visit: Payer: Medicare PPO | Admitting: Internal Medicine

## 2023-07-29 ENCOUNTER — Ambulatory Visit: Payer: Medicare PPO | Admitting: Internal Medicine

## 2023-07-29 ENCOUNTER — Other Ambulatory Visit: Payer: Self-pay | Admitting: Medical Oncology

## 2023-07-29 DIAGNOSIS — C3490 Malignant neoplasm of unspecified part of unspecified bronchus or lung: Secondary | ICD-10-CM

## 2023-07-29 NOTE — Progress Notes (Signed)
Labs date moved to tomorrow

## 2023-07-30 ENCOUNTER — Inpatient Hospital Stay: Payer: Medicare PPO

## 2023-07-30 ENCOUNTER — Ambulatory Visit (HOSPITAL_COMMUNITY)
Admission: RE | Admit: 2023-07-30 | Discharge: 2023-07-30 | Disposition: A | Discharge status: Home / Self Care | Payer: Medicare PPO | Source: Ambulatory Visit | Attending: Internal Medicine | Admitting: Internal Medicine

## 2023-07-30 ENCOUNTER — Encounter (HOSPITAL_COMMUNITY): Payer: Self-pay

## 2023-07-30 DIAGNOSIS — K449 Diaphragmatic hernia without obstruction or gangrene: Secondary | ICD-10-CM | POA: Diagnosis not present

## 2023-07-30 DIAGNOSIS — C3490 Malignant neoplasm of unspecified part of unspecified bronchus or lung: Secondary | ICD-10-CM | POA: Insufficient documentation

## 2023-07-30 DIAGNOSIS — I700 Atherosclerosis of aorta: Secondary | ICD-10-CM | POA: Diagnosis not present

## 2023-07-30 LAB — CBC WITH DIFFERENTIAL (CANCER CENTER ONLY)
Abs Immature Granulocytes: 0.04 10*3/uL (ref 0.00–0.07)
Basophils Absolute: 0.1 10*3/uL (ref 0.0–0.1)
Basophils Relative: 1 %
Eosinophils Absolute: 0.2 10*3/uL (ref 0.0–0.5)
Eosinophils Relative: 3 %
HCT: 42.7 % (ref 36.0–46.0)
Hemoglobin: 13.9 g/dL (ref 12.0–15.0)
Immature Granulocytes: 1 %
Lymphocytes Relative: 34 %
Lymphs Abs: 2.3 10*3/uL (ref 0.7–4.0)
MCH: 29.4 pg (ref 26.0–34.0)
MCHC: 32.6 g/dL (ref 30.0–36.0)
MCV: 90.5 fL (ref 80.0–100.0)
Monocytes Absolute: 0.5 10*3/uL (ref 0.1–1.0)
Monocytes Relative: 7 %
Neutro Abs: 3.7 10*3/uL (ref 1.7–7.7)
Neutrophils Relative %: 54 %
Platelet Count: 258 10*3/uL (ref 150–400)
RBC: 4.72 MIL/uL (ref 3.87–5.11)
RDW: 12.9 % (ref 11.5–15.5)
WBC Count: 6.8 10*3/uL (ref 4.0–10.5)
nRBC: 0 % (ref 0.0–0.2)

## 2023-07-30 LAB — CMP (CANCER CENTER ONLY)
ALT: 21 U/L (ref 0–44)
AST: 14 U/L — ABNORMAL LOW (ref 15–41)
Albumin: 4 g/dL (ref 3.5–5.0)
Alkaline Phosphatase: 72 U/L (ref 38–126)
Anion gap: 11 (ref 5–15)
BUN: 10 mg/dL (ref 8–23)
CO2: 25 mmol/L (ref 22–32)
Calcium: 9.5 mg/dL (ref 8.9–10.3)
Chloride: 103 mmol/L (ref 98–111)
Creatinine: 0.78 mg/dL (ref 0.44–1.00)
GFR, Estimated: >60 mL/min (ref >60)
Glucose, Bld: 108 mg/dL — ABNORMAL HIGH (ref 70–99)
Potassium: 4.7 mmol/L (ref 3.5–5.1)
Sodium: 139 mmol/L (ref 135–145)
Total Bilirubin: 0.6 mg/dL (ref 0.3–1.2)
Total Protein: 7 g/dL (ref 6.5–8.1)

## 2023-07-30 MED ORDER — SODIUM CHLORIDE (PF) 0.9 % IJ SOLN
INTRAMUSCULAR | Status: AC
  Filled 2023-07-30: qty 50

## 2023-07-30 MED ORDER — IOHEXOL 300 MG/ML  SOLN
100.0000 mL | Freq: Once | INTRAMUSCULAR | Status: AC | PRN
  Administered 2023-07-30: 75 mL via INTRAVENOUS

## 2023-08-05 ENCOUNTER — Inpatient Hospital Stay: Payer: Medicare PPO | Admitting: Internal Medicine

## 2023-08-05 VITALS — BP 127/82 | HR 80 | Temp 98.0°F | Resp 18 | Ht 66.6 in | Wt 154.6 lb

## 2023-08-05 DIAGNOSIS — C3490 Malignant neoplasm of unspecified part of unspecified bronchus or lung: Secondary | ICD-10-CM | POA: Diagnosis not present

## 2023-08-05 DIAGNOSIS — Z08 Encounter for follow-up examination after completed treatment for malignant neoplasm: Secondary | ICD-10-CM | POA: Diagnosis not present

## 2023-08-05 DIAGNOSIS — Z85118 Personal history of other malignant neoplasm of bronchus and lung: Secondary | ICD-10-CM | POA: Diagnosis not present

## 2023-08-05 NOTE — Progress Notes (Signed)
[facility] [phone]   [phone]  OFFICE PROGRESS NOTE  Lucky Cowboy, MD [address] [place] [address]  DIAGNOSIS: stage Ia (T1b, N0, M0) non-small cell lung cancer, adenocarcinoma presented with right upper lobe groundglass opacity  PRIOR THERAPY:  Status post right upper lobe wedge resection with lymph node dissection under the care of Dr. Dorris Fetch on December 25, 2022.   CURRENT THERAPY: Observation  INTERVAL HISTORY: Donna Dillon 77 y.o. female returns to the clinic today for follow-up visit.  The patient is feeling fine today with no concerning complaints but was very anxious about her scan results.  She denied having any current chest pain, shortness of breath, cough or hemoptysis.  She was diagnosed with COVID-19 few weeks ago.  She denied having any nausea, vomiting, diarrhea or constipation.  She has no headache or visual changes.  She denied having any recent weight loss or night sweats.  She is here today for evaluation with repeat CT scan of the chest for restaging of her disease.  MEDICAL HISTORY: Past Medical History:  Diagnosis Date   Anemia    mild   Anxiety    Colon polyp    hyperplastic   Diverticulosis    Esophageal stricture    GERD (gastroesophageal reflux disease)    no s/s now- off medicines   Hyperlipidemia    Palpitations    Pneumonia    PONV (postoperative nausea and vomiting)    Prediabetes    Tachycardia     ALLERGIES:  is allergic to prednisone, penicillins, asa [aspirin], levaquin [levofloxacin], macrolides and ketolides, paxil [paroxetine hcl], tetracyclines & related, zoloft [sertraline hcl], sulfa antibiotics, and trimethoprim.  MEDICATIONS:  Current Outpatient Medications  Medication Sig Dispense Refill   atenolol (TENORMIN) 25 MG tablet Take 1 tablet (25 mg total) by mouth daily. 90 tablet 0   clomiPRAMINE (ANAFRANIL) 50 MG capsule Take 1 capsule (50 mg total) by  mouth at bedtime. 90 capsule 2   clonazePAM (KLONOPIN) 1 MG tablet Take 1.5 tablets (1.5 mg total) by mouth at bedtime. 45 tablet 5   ezetimibe (ZETIA) 10 MG tablet Take  1 tablet  Daily  for Cholesterol 90 tablet 3   gabapentin (NEURONTIN) 300 MG capsule Take 1 capsule (300 mg total) by mouth at bedtime. 30 capsule 1   Propylene Glycol (SYSTANE BALANCE) 0.6 % SOLN Place 1 drop into both eyes as needed (dry eyes).     QUEtiapine (SEROQUEL) 25 MG tablet Take 1 tablet (25 mg total) by mouth at bedtime. 90 tablet 1   No current facility-administered medications for this visit.    SURGICAL HISTORY:  Past Surgical History:  Procedure Laterality Date   BREAST BIOPSY Left    BRONCHIAL NEEDLE ASPIRATION BIOPSY  12/25/2022   Procedure: BRONCHIAL NEEDLE ASPIRATION BIOPSIES;  Surgeon: [name], MD;  Location: [facility] ENDOSCOPY;  Service: Pulmonary;;   CESAREAN SECTION     x1   COLONOSCOPY  2017   eye lift     EYE SURGERY     lasix   FIDUCIAL MARKER PLACEMENT  12/25/2022   Procedure: FIDUCIAL DYE MARKING;  Surgeon: [name], MD;  Location: [facility] ENDOSCOPY;  Service: Pulmonary;;   INTERCOSTAL NERVE BLOCK Right 12/25/2022   Procedure: INTERCOSTAL NERVE BLOCK;  Surgeon: Loreli Slot, MD;  Location: [facility] OR;  Service: Thoracic;  Laterality: Right;   NODE DISSECTION Right 12/25/2022   Procedure: NODE DISSECTION;  Surgeon: Dorris Fetch,  Salvatore Decent, MD;  Location: [facility] OR;  Service: Thoracic;  Laterality: Right;   POLYPECTOMY     TONSILLECTOMY     UPPER GASTROINTESTINAL ENDOSCOPY      REVIEW OF SYSTEMS:  A comprehensive review of systems was negative.   PHYSICAL EXAMINATION: General appearance: alert, cooperative, and no distress Head: Normocephalic, without obvious abnormality, atraumatic Neck: no adenopathy, no JVD, supple, symmetrical, trachea midline, and thyroid not enlarged, symmetric, no tenderness/mass/nodules Lymph nodes: Cervical, supraclavicular, and axillary nodes  normal. Resp: clear to auscultation bilaterally Back: symmetric, no curvature. ROM normal. No CVA tenderness. Cardio: regular rate and rhythm, S1, S2 normal, no murmur, click, rub or gallop GI: soft, non-tender; bowel sounds normal; no masses,  no organomegaly Extremities: extremities normal, atraumatic, no cyanosis or edema  ECOG PERFORMANCE STATUS: 1 - Symptomatic but completely ambulatory  Blood pressure 127/82, pulse 80, temperature 98 F (36.7 C), temperature source Oral, resp. rate 18, height 5' 6.6" (1.692 m), weight 154 lb 9.6 oz (70.1 kg), last menstrual period 12/09/1994, SpO2 96%.  LABORATORY DATA: Lab Results  Component Value Date   WBC 6.8 07/30/2023   HGB 13.9 07/30/2023   HCT 42.7 07/30/2023   MCV 90.5 07/30/2023   PLT 258 07/30/2023      Chemistry      Component Value Date/Time   NA 139 07/30/2023 1454   K 4.7 07/30/2023 1454   CL 103 07/30/2023 1454   CO2 25 07/30/2023 1454   BUN 10 07/30/2023 1454   CREATININE 0.78 07/30/2023 1454   CREATININE 0.82 02/18/2023 1551      Component Value Date/Time   CALCIUM 9.5 07/30/2023 1454   ALKPHOS 72 07/30/2023 1454   AST 14 (L) 07/30/2023 1454   ALT 21 07/30/2023 1454   BILITOT 0.6 07/30/2023 1454       RADIOGRAPHIC STUDIES: CT Chest W Contrast  Result Date: 08/04/2023 CLINICAL DATA:  Non-small-cell lung cancer. Restaging. * Tracking Code: BO * EXAM: CT CHEST WITH CONTRAST TECHNIQUE: Multidetector CT imaging of the chest was performed during intravenous contrast administration. RADIATION DOSE REDUCTION: This exam was performed according to the departmental dose-optimization program which includes automated exposure control, adjustment of the mA and/or kV according to patient size and/or use of iterative reconstruction technique. CONTRAST:  75mL OMNIPAQUE IOHEXOL 300 MG/ML  SOLN COMPARISON:  10/16/2022 FINDINGS: Cardiovascular: The heart size is normal. No substantial pericardial effusion. Coronary artery  calcification is evident. Mild atherosclerotic calcification is noted in the wall of the thoracic aorta. Mediastinum/Nodes: No mediastinal lymphadenopathy. There is no hilar lymphadenopathy. Moderate to large hiatal hernia. The esophagus has normal imaging features. There is no axillary lymphadenopathy. Lungs/Pleura: Surgical changes noted anterior right upper lung consistent with the reported history of wedge resection. Atelectasis noted in the lingula. No new suspicious pulmonary nodule or mass. No pleural effusion. Upper Abdomen: Scattered tiny hypodensities in the liver parenchyma are too small to characterize but are statistically most likely benign. No followup imaging is recommended. Small water density lesion upper pole right kidney is compatible with a cyst. No followup imaging is recommended. Musculoskeletal: No worrisome lytic or sclerotic osseous abnormality. IMPRESSION: 1. Surgical changes anterior right upper lung consistent with the reported history of wedge resection. This exam serves as the patient's new postoperative baseline. No findings to suggest definite recurrent or metastatic disease. 2. Moderate to large hiatal hernia. 3.  Aortic Atherosclerosis (ICD10-I70.0). Electronically Signed   By: Kennith Center M.D.   On: 08/04/2023 12:56    ASSESSMENT AND PLAN:  This is a very pleasant 77 years old white female with a stage Ia (T1b, N0, M0) non-small cell lung cancer, adenocarcinoma presented with right upper lobe groundglass opacity status post right upper lobe wedge resection with lymph node dissection under the care of Dr. Dorris Fetch on December 25, 2022.  She is currently on observation and feeling fine.  She had repeat CT scan of the chest performed recently.  I personally and independently reviewed the scan and discussed the result with the patient today. Her scan showed no concerning findings for disease recurrence or metastasis. I recommended for her to continue on observation with repeat  CT scan of the chest in 6 months. She was advised to call immediately if she has any other concerning symptoms in the interval. The patient voices understanding of current disease status and treatment options and is in agreement with the current care plan.  All questions were answered. The patient knows to call the clinic with any problems, questions or concerns. We can certainly see the patient much sooner if necessary.  The total time spent in the appointment was 20 minutes.  Disclaimer: This note was dictated with voice recognition software. Similar sounding words can inadvertently be transcribed and may not be corrected upon review.

## 2023-08-25 ENCOUNTER — Encounter: Payer: Self-pay | Admitting: Internal Medicine

## 2023-08-25 ENCOUNTER — Ambulatory Visit (INDEPENDENT_AMBULATORY_CARE_PROVIDER_SITE_OTHER): Payer: Medicare PPO | Admitting: Internal Medicine

## 2023-08-25 VITALS — BP 122/78 | HR 96 | Temp 97.9°F | Resp 17 | Ht 66.5 in | Wt 154.2 lb

## 2023-08-25 DIAGNOSIS — Z902 Acquired absence of lung [part of]: Secondary | ICD-10-CM

## 2023-08-25 DIAGNOSIS — E782 Mixed hyperlipidemia: Secondary | ICD-10-CM | POA: Diagnosis not present

## 2023-08-25 DIAGNOSIS — Z0000 Encounter for general adult medical examination without abnormal findings: Secondary | ICD-10-CM

## 2023-08-25 DIAGNOSIS — E559 Vitamin D deficiency, unspecified: Secondary | ICD-10-CM | POA: Diagnosis not present

## 2023-08-25 DIAGNOSIS — R7309 Other abnormal glucose: Secondary | ICD-10-CM

## 2023-08-25 DIAGNOSIS — Z23 Encounter for immunization: Secondary | ICD-10-CM | POA: Diagnosis not present

## 2023-08-25 DIAGNOSIS — Z136 Encounter for screening for cardiovascular disorders: Secondary | ICD-10-CM

## 2023-08-25 DIAGNOSIS — Z79899 Other long term (current) drug therapy: Secondary | ICD-10-CM

## 2023-08-25 DIAGNOSIS — R0989 Other specified symptoms and signs involving the circulatory and respiratory systems: Secondary | ICD-10-CM

## 2023-08-25 DIAGNOSIS — Z0001 Encounter for general adult medical examination with abnormal findings: Secondary | ICD-10-CM

## 2023-08-25 DIAGNOSIS — Z1211 Encounter for screening for malignant neoplasm of colon: Secondary | ICD-10-CM

## 2023-08-25 DIAGNOSIS — F325 Major depressive disorder, single episode, in full remission: Secondary | ICD-10-CM | POA: Diagnosis not present

## 2023-08-25 DIAGNOSIS — Z8249 Family history of ischemic heart disease and other diseases of the circulatory system: Secondary | ICD-10-CM

## 2023-08-25 NOTE — Patient Instructions (Signed)
Due to recent changes in healthcare laws, you may see the results of your imaging and laboratory studies on MyChart before your provider has had a chance to review them.  We understand that in some cases there may be results that are confusing or concerning to you. Not all laboratory results come back in the same time frame and the provider may be waiting for multiple results in order to interpret others.  Please give [place] 48 hours in order for your provider to thoroughly review all the results before contacting the office for clarification of your results.   +++++++++++++++++++++++++  Vit D  & Vit C 1,000 mg   are recommended to help protect  against the Covid-19 and other Corona viruses.    Also it's recommended  to take  Zinc 50 mg  to help  protect against the Covid-19   and best place to get  is also on [email or web address]  and don't pay more than 6-8 cents /pill !  ================================ Coronavirus (COVID-19) Are you at risk?  Are you at risk for the Coronavirus (COVID-19)?  To be considered HIGH RISK for Coronavirus (COVID-19), you have to meet the following criteria:  Traveled to [place], [place], [place], [place] or [place]; or in the [place] to [place], [place], [place]  or [place]; and have fever, cough, and shortness of breath within the last 2 weeks of travel OR Been in close contact with a person diagnosed with COVID-19 within the last 2 weeks and have  fever, cough,and shortness of breath  IF YOU DO NOT MEET THESE CRITERIA, YOU ARE CONSIDERED LOW RISK FOR COVID-19.  What to do if you are HIGH RISK for COVID-19?  If you are having a medical emergency, call 911. Seek medical care right away. Before you go to a doctor's office, urgent care or emergency department,  call ahead and tell them about your recent travel, contact with someone diagnosed with COVID-19   and your symptoms.  You should receive instructions from your physician's office regarding next  steps of care.  When you arrive at healthcare provider, tell the healthcare staff immediately you have returned from  visiting [place], [place], [place], [place] or [place]; or traveled in the [place] to [place], [place],  [place] or [place] in the last two weeks or you have been in close contact with a person diagnosed with  COVID-19 in the last 2 weeks.   Tell the health care staff about your symptoms: fever, cough and shortness of breath. After you have been seen by a medical provider, you will be either: Tested for (COVID-19) and discharged home on quarantine except to seek medical care if  symptoms worsen, and asked to  Stay home and avoid contact with others until you get your results (4-5 days)  Avoid travel on public transportation if possible (such as bus, train, or airplane) or Sent to the Emergency Department by EMS for evaluation, COVID-19 testing  and  possible admission depending on your condition and test results.  What to do if you are LOW RISK for COVID-19?  Reduce your risk of any infection by using the same precautions used for avoiding the common cold or flu:  Wash your hands often with soap and warm water for at least 20 seconds.  If soap and water are not readily available,  use an alcohol-based hand sanitizer with at least 60% alcohol.  If coughing or sneezing, cover your mouth and nose by coughing  or sneezing into the elbow areas of your shirt or coat,  into a tissue or into your sleeve (not your hands). Avoid shaking hands with others and consider head nods or verbal greetings only. Avoid touching your eyes, nose, or mouth with unwashed hands.  Avoid close contact with people who are sick. Avoid places or events with large numbers of people in one location, like concerts or sporting events. Carefully consider travel plans you have or are making. If you are planning any travel outside or inside the [place], visit the CDC's Travelers' Health webpage for the  latest health notices. If you have some symptoms but not all symptoms, continue to monitor at home and seek medical attention  if your symptoms worsen. If you are having a medical emergency, call 911.   >>>>>>>>>>>>>>>>>>>>>>>>>>>>>>>>>  We Do NOT Approve of [employer] > > > > > > > > > > > > > > > > > > > > > > > > > > > > > > > > > > > > > > >  Preventive Care for Adults  A healthy lifestyle and preventive care can promote health and wellness. Preventive health guidelines for women include the following key practices. A routine yearly physical is a good way to check with your health care provider about your health and preventive screening. It is a chance to share any concerns and updates on your health and to receive a thorough exam. Visit your dentist for a routine exam and preventive care every 6 months. Brush your teeth twice a day and floss once a day. Good oral hygiene prevents tooth decay and gum disease. The frequency of eye exams is based on your age, health, family medical history, use of contact lenses, and other factors. Follow your health care provider's recommendations for frequency of eye exams. Eat a healthy diet. Foods like vegetables, fruits, whole grains, low-fat dairy products, and lean protein foods contain the nutrients you need without too many calories. Decrease your intake of foods high in solid fats, added sugars, and salt. Eat the right amount of calories for you. Get information about a proper diet from your health care provider, if necessary. Regular physical exercise is one of the most important things you can do for your health. Most adults should get at least 150 minutes of moderate-intensity exercise (any activity that increases your heart rate and causes you to sweat) each week. In addition, most adults need muscle-strengthening exercises on 2 or more days a week. Maintain a healthy weight. The body mass index (BMI) is a screening tool to identify  possible weight problems. It provides an estimate of body fat based on height and weight. Your health care provider can find your BMI and can help you achieve or maintain a healthy weight. For adults 20 years and older: A BMI below 18.5 is considered underweight. A BMI of 18.5 to 24.9 is normal. A BMI of 25 to 29.9 is considered overweight. A BMI of 30 and above is considered obese. Maintain normal blood lipids and cholesterol levels by exercising and minimizing your intake of saturated fat. Eat a balanced diet with plenty of fruit and vegetables. If your lipid or cholesterol levels are high, you are over 50, or you are at high risk for heart disease, you may need your cholesterol levels checked more frequently. Ongoing high lipid and cholesterol levels should be treated with medicines if diet and exercise are not working. If you smoke, find out from  your health care provider how to quit. If you do not use tobacco, do not start. Lung cancer screening is recommended for adults aged 55-80 years who are at high risk for developing lung cancer because of a history of smoking. A yearly low-dose CT scan of the lungs is recommended for people who have at least a 30-pack-year history of smoking and are a current smoker or have quit within the past 15 years. A pack year of smoking is smoking an average of 1 pack of cigarettes a day for 1 year (for example: 1 pack a day for 30 years or 2 packs a day for 15 years). Yearly screening should continue until the smoker has stopped smoking for at least 15 years. Yearly screening should be stopped for people who develop a health problem that would prevent them from having lung cancer treatment. Avoid use of street drugs. Do not share needles with anyone. Ask for help if you need support or instructions about stopping the use of drugs. High blood pressure causes heart disease and increases the risk of stroke.  Ongoing high blood pressure should be treated with medicines if  weight loss and exercise do not work. If you are 49-32 years old, ask your health care provider if you should take aspirin to prevent strokes. Diabetes screening involves taking a blood sample to check your fasting blood sugar level. This should be done once every 3 years, after age 61, if you are within normal weight and without risk factors for diabetes. Testing should be considered at a younger age or be carried out more frequently if you are overweight and have at least 1 risk factor for diabetes. Breast cancer screening is essential preventive care for women. You should practice "breast self-awareness." This means understanding the normal appearance and feel of your breasts and may include breast self-examination. Any changes detected, no matter how small, should be reported to a health care provider. Women in their 53s and 30s should have a clinical breast exam (CBE) by a health care provider as part of a regular health exam every 1 to 3 years. After age 12, women should have a CBE every year. Starting at age 91, women should consider having a mammogram (breast X-ray test) every year. Women who have a family history of breast cancer should talk to their health care provider about genetic screening. Women at a high risk of breast cancer should talk to their health care providers about having an MRI and a mammogram every year. Breast cancer gene (BRCA)-related cancer risk assessment is recommended for women who have family members with BRCA-related cancers. BRCA-related cancers include breast, ovarian, tubal, and peritoneal cancers. Having family members with these cancers may be associated with an increased risk for harmful changes (mutations) in the breast cancer genes BRCA1 and BRCA2. Results of the assessment will determine the need for genetic counseling and BRCA1 and BRCA2 testing. Routine pelvic exams to screen for cancer are no longer recommended for nonpregnant women who are considered low risk for  cancer of the pelvic organs (ovaries, uterus, and vagina) and who do not have symptoms. Ask your health care provider if a screening pelvic exam is right for you. If you have had past treatment for cervical cancer or a condition that could lead to cancer, you need Pap tests and screening for cancer for at least 20 years after your treatment. If Pap tests have been discontinued, your risk factors (such as having a new sexual partner) need to be  reassessed to determine if screening should be resumed. Some women have medical problems that increase the chance of getting cervical cancer. In these cases, your health care provider may recommend more frequent screening and Pap tests.  Colorectal cancer can be detected and often prevented. Most routine colorectal cancer screening begins at the age of 66 years and continues through age 28 years. However, your health care provider may recommend screening at an earlier age if you have risk factors for colon cancer. On a yearly basis, your health care provider may provide home test kits to check for hidden blood in the stool. Use of a small camera at the end of a tube, to directly examine the colon (sigmoidoscopy or colonoscopy), can detect the earliest forms of colorectal cancer. Talk to your health care provider about this at age 2, when routine screening begins.  Direct exam of the colon should be repeated every 5-10 years through age 26 years, unless early forms of pre-cancerous polyps or small growths are found. Osteoporosis is a disease in which the bones lose minerals and strength with aging. This can result in serious bone fractures or breaks. The risk of osteoporosis can be identified using a bone density scan. Women ages 48 years and over and women at risk for fractures or osteoporosis should discuss screening with their health care providers. Ask your health care provider whether you should take a calcium supplement or vitamin D to reduce the rate of  osteoporosis. Menopause can be associated with physical symptoms and risks. Hormone replacement therapy is available to decrease symptoms and risks. You should talk to your health care provider about whether hormone replacement therapy is right for you. Use sunscreen. Apply sunscreen liberally and repeatedly throughout the day. You should seek shade when your shadow is shorter than you. Protect yourself by wearing long sleeves, pants, a wide-brimmed hat, and sunglasses year round, whenever you are outdoors. Once a month, do a whole body skin exam, using a mirror to look at the skin on your back. Tell your health care provider of new moles, moles that have irregular borders, moles that are larger than a pencil eraser, or moles that have changed in shape or color. Stay current with required vaccines (immunizations). Influenza vaccine. All adults should be immunized every year. Tetanus, diphtheria, and acellular pertussis (Td, Tdap) vaccine. Pregnant women should receive 1 dose of Tdap vaccine during each pregnancy. The dose should be obtained regardless of the length of time since the last dose. Immunization is preferred during the 27th-36th week of gestation. An adult who has not previously received Tdap or who does not know her vaccine status should receive 1 dose of Tdap. This initial dose should be followed by tetanus and diphtheria toxoids (Td) booster doses every 10 years. Adults with an unknown or incomplete history of completing a 3-dose immunization series with Td-containing vaccines should begin or complete a primary immunization series including a Tdap dose. Adults should receive a Td booster every 10 years.  Zoster vaccine. One dose is recommended for adults aged 37 years or older unless certain conditions are present.  Pneumococcal 13-valent conjugate (PCV13) vaccine. When indicated, a person who is uncertain of her immunization history and has no record of immunization should receive the PCV13  vaccine. An adult aged 52 years or older who has certain medical conditions and has not been previously immunized should receive 1 dose of PCV13 vaccine. This PCV13 should be followed with a dose of pneumococcal polysaccharide (PPSV23) vaccine. The PPSV23  vaccine dose should be obtained at least 1 or more year(s) after the dose of PCV13 vaccine. An adult aged 4 years or older who has certain medical conditions and previously received 1 or more doses of PPSV23 vaccine should receive 1 dose of PCV13. The PCV13 vaccine dose should be obtained 1 or more years after the last PPSV23 vaccine dose.  Pneumococcal polysaccharide (PPSV23) vaccine. When PCV13 is also indicated, PCV13 should be obtained first. All adults aged 65 years and older should be immunized. An adult younger than age 47 years who has certain medical conditions should be immunized. Any person who resides in a nursing home or long-term care facility should be immunized. An adult smoker should be immunized. People with an immunocompromised condition and certain other conditions should receive both PCV13 and PPSV23 vaccines. People with human immunodeficiency virus (HIV) infection should be immunized as soon as possible after diagnosis. Immunization during chemotherapy or radiation therapy should be avoided. Routine use of PPSV23 vaccine is not recommended for American Indians, [address], or people younger than 65 years unless there are medical conditions that require PPSV23 vaccine. When indicated, people who have unknown immunization and have no record of immunization should receive PPSV23 vaccine. One-time revaccination 5 years after the first dose of PPSV23 is recommended for people aged 19-64 years who have chronic kidney failure, nephrotic syndrome, asplenia, or immunocompromised conditions. People who received 1-2 doses of PPSV23 before age 82 years should receive another dose of PPSV23 vaccine at age 57 years or later if at least 5 years have  passed since the previous dose. Doses of PPSV23 are not needed for people immunized with PPSV23 at or after age 26 years.  Preventive Services / Frequency  Ages 65 years and over Blood pressure check. Lipid and cholesterol check. Lung cancer screening. / Every year if you are aged 55-80 years and have a 30-pack-year history of smoking and currently smoke or have quit within the past 15 years. Yearly screening is stopped once you have quit smoking for at least 15 years or develop a health problem that would prevent you from having lung cancer treatment. Clinical breast exam.** / Every year after age 35 years.  BRCA-related cancer risk assessment.** / For women who have family members with a BRCA-related cancer (breast, ovarian, tubal, or peritoneal cancers). Mammogram.** / Every year beginning at age 51 years and continuing for as long as you are in good health. Consult with your health care provider. Pap test.** / Every 3 years starting at age 62 years through age 51 or 28 years with 3 consecutive normal Pap tests. Testing can be stopped between 65 and 70 years with 3 consecutive normal Pap tests and no abnormal Pap or HPV tests in the past 10 years. Fecal occult blood test (FOBT) of stool. / Every year beginning at age 91 years and continuing until age 12 years. You may not need to do this test if you get a colonoscopy every 10 years. Flexible sigmoidoscopy or colonoscopy.** / Every 5 years for a flexible sigmoidoscopy or every 10 years for a colonoscopy beginning at age 72 years and continuing until age 39 years. Hepatitis C blood test.** / For all people born from [birth year] through 1964 and any individual with known risks for hepatitis C. Osteoporosis screening.** / A one-time screening for women ages 57 years and over and women at risk for fractures or osteoporosis. Skin self-exam. / Monthly. Influenza vaccine. / Every year. Tetanus, diphtheria, and acellular pertussis (Tdap/Td) vaccine.** /  1 dose  of Td every 10 years. Zoster vaccine.** / 1 dose for adults aged 33 years or older. Pneumococcal 13-valent conjugate (PCV13) vaccine.** / Consult your health care provider. Pneumococcal polysaccharide (PPSV23) vaccine.** / 1 dose for all adults aged 26 years and older. Screening for abdominal aortic aneurysm (AAA)  by ultrasound is recommended for people who have history of high blood pressure or who are current or former smokers. ++++++++++++++++++++ Recommend Adult Low Dose Aspirin or  coated  Aspirin 81 mg daily  To reduce risk of Colon Cancer 40 %,  Skin Cancer 26 % ,  Melanoma 46%  and  Pancreatic cancer 60% ++++++++++++++++++++ Vitamin D goal  is between 70-100.  Please make sure that you are taking your Vitamin D as directed.  It is very important as a natural anti-inflammatory  helping hair, skin, and nails, as well as reducing stroke and heart attack risk.  It helps your bones and helps with mood. It also decreases numerous cancer risks so please take it as directed.  Low Vit D is associated with a 200-300% higher risk for CANCER  and 200-300% higher risk for HEART   ATTACK  &  STROKE.   .....................................Marland Kitchen It is also associated with higher death rate at younger ages,  autoimmune diseases like Rheumatoid arthritis, Lupus, Multiple Sclerosis.    Also many other serious conditions, like depression, Alzheimer's Dementia, infertility, muscle aches, fatigue, fibromyalgia - just to name a few. ++++++++++++++++++ Recommend the book "The END of DIETING" by Dr Monico Hoar  & the book "The END of DIABETES " by Dr Monico Hoar At [email or web address].[email or web address] - get book & Audio CD's    Being diabetic has a  300% increased risk for heart attack, stroke, cancer, and alzheimer- type vascular dementia. It is very important that you work harder with diet by avoiding all foods that are white. Avoid white rice (brown & wild rice is OK), white potatoes (sweetpotatoes in moderation is OK),  White bread or wheat bread or anything made out of white flour like bagels, donuts, rolls, buns, biscuits, cakes, pastries, cookies, pizza crust, and pasta (made from white flour & egg whites) - vegetarian pasta or spinach or wheat pasta is OK. Multigrain breads like Arnold's or [employer], or multigrain sandwich thins or flatbreads.  Diet, exercise and weight loss can reverse and cure diabetes in the early stages.  Diet, exercise and weight loss is very important in the control and prevention of complications of diabetes which affects every system in your body, ie. Brain - dementia/stroke, eyes - glaucoma/blindness, heart - heart attack/heart failure, kidneys - dialysis, stomach - gastric paralysis, intestines - malabsorption, nerves - severe painful neuritis, circulation - gangrene & loss of a leg(s), and finally cancer and Alzheimers.    I recommend avoid fried & greasy foods,  sweets/candy, white rice (brown or wild rice or Quinoa is OK), white potatoes (sweet potatoes are OK) - anything made from white flour - bagels, doughnuts, rolls, buns, biscuits,white and wheat breads, pizza crust and traditional pasta made of white flour & egg white(vegetarian pasta or spinach or wheat pasta is OK).  Multi-grain bread is OK - like multi-grain flat bread or sandwich thins. Avoid alcohol in excess. Exercise is also important.    Eat all the vegetables you want - avoid meat, especially red meat and dairy - especially cheese.  Cheese is the most concentrated form of trans-fats which is the worst thing to clog up our arteries. Veggie cheese is OK  which can be found in the fresh produce section at [facility] or [employer] or [employer]  +++++++++++++++++++ DASH Eating Plan  DASH stands for "Dietary Approaches to Stop Hypertension."   The DASH eating plan is a healthy eating plan that has been shown to reduce high blood pressure (hypertension). Additional health benefits may include reducing the risk of type 2  diabetes mellitus, heart disease, and stroke. The DASH eating plan may also help with weight loss. WHAT DO I NEED TO KNOW ABOUT THE DASH EATING PLAN? For the DASH eating plan, you will follow these general guidelines: Choose foods with a percent daily value for sodium of less than 5% (as listed on the food label). Use salt-free seasonings or herbs instead of table salt or sea salt. Check with your health care provider or pharmacist before using salt substitutes. Eat lower-sodium products, often labeled as "lower sodium" or "no salt added." Eat fresh foods. Eat more vegetables, fruits, and low-fat dairy products. Choose whole grains. Look for the word "whole" as the first word in the ingredient list. Choose fish  Limit sweets, desserts, sugars, and sugary drinks. Choose heart-healthy fats. Eat veggie cheese  Eat more home-cooked food and less restaurant, buffet, and fast food. Limit fried foods. Cook foods using methods other than frying. Limit canned vegetables. If you do use them, rinse them well to decrease the sodium. When eating at a restaurant, ask that your food be prepared with less salt, or no salt if possible.                      WHAT FOODS CAN I EAT? Read Dr Francis Dowse Fuhrman's books on The End of Dieting & The End of Diabetes  Grains Whole grain or whole wheat bread. Brown rice. Whole grain or whole wheat pasta. Quinoa, bulgur, and whole grain cereals. Low-sodium cereals. Corn or whole wheat flour tortillas. Whole grain cornbread. Whole grain crackers. Low-sodium crackers.  Vegetables Fresh or frozen vegetables (raw, steamed, roasted, or grilled). Low-sodium or reduced-sodium tomato and vegetable juices. Low-sodium or reduced-sodium tomato sauce and paste. Low-sodium or reduced-sodium canned vegetables.   Fruits All fresh, canned (in natural juice), or frozen fruits.  Protein Products  All fish and seafood.  Dried beans, peas, or lentils. Unsalted nuts and seeds. Unsalted  canned beans.  Dairy Low-fat dairy products, such as skim or 1% milk, 2% or reduced-fat cheeses, low-fat ricotta or cottage cheese, or plain low-fat yogurt. Low-sodium or reduced-sodium cheeses.  Fats and Oils Tub margarines without trans fats. Light or reduced-fat mayonnaise and salad dressings (reduced sodium). Avocado. Safflower, olive, or canola oils. Natural peanut or almond butter.  Other Unsalted popcorn and pretzels. The items listed above may not be a complete list of recommended foods or beverages. Contact your dietitian for more options.  +++++++++++++++  WHAT FOODS ARE NOT RECOMMENDED? Grains/ White flour or wheat flour White bread. White pasta. White rice. Refined cornbread. Bagels and croissants. Crackers that contain trans fat.  Vegetables  Creamed or fried vegetables. Vegetables in a . Regular canned vegetables. Regular canned tomato sauce and paste. Regular tomato and vegetable juices.  Fruits Dried fruits. Canned fruit in light or heavy syrup. Fruit juice.  Meat and Other Protein Products Meat in general - RED meat & White meat.  Fatty cuts of meat. Ribs, chicken wings, all processed meats as bacon, sausage, bologna, salami, fatback, hot dogs, bratwurst and packaged luncheon meats.  Dairy Whole or 2% milk, cream, half-and-half, and cream cheese.  Whole-fat or sweetened yogurt. Full-fat cheeses or blue cheese. Non-dairy creamers and whipped toppings. Processed cheese, cheese spreads, or cheese curds.  Condiments Onion and garlic salt, seasoned salt, table salt, and sea salt. Canned and packaged gravies. Worcestershire sauce. Tartar sauce. Barbecue sauce. Teriyaki sauce. Soy sauce, including reduced sodium. Steak sauce. Fish sauce. Oyster sauce. Cocktail sauce. Horseradish. Ketchup and mustard. Meat flavorings and tenderizers. Bouillon cubes. Hot sauce. Tabasco sauce. Marinades. Taco seasonings. Relishes.  Fats and Oils Butter, stick margarine, lard, shortening and  bacon fat. Coconut, palm kernel, or palm oils. Regular salad dressings.  Pickles and olives. Salted popcorn and pretzels.  The items listed above may not be a complete list of foods and beverages to avoid.

## 2023-08-25 NOTE — Progress Notes (Signed)
Annual Screening/Preventative Visit & Comprehensive Evaluation &  Examination   Future Appointments  Date Time Provider Department  08/25/2023                                  cpe  3:00 PM Lucky Cowboy, MD [facility]  11/25/2023  4:00 PM Cottle, Steva Ready., MD [facility]  02/05/2024  3:15 PM Si Gaul, MD [facility] [address]  09/06/2024                                  cpe  2:00 PM Lucky Cowboy, MD [facility]        This very nice 77 y.o. MWF presents for a Screening /Preventative Visit & comprehensive evaluation and management of multiple medical co-morbidities.  Patient has been followed for HTN, HLD, Prediabetes  and Vitamin D Deficiency. Patient is followed by Psychiatrist Dr Jennelle Human ~ 20+  years for long hx/o major Depression, OCD with mixed obsessional thoughts & acts, Generalized Anxiety Disorder and Insomnia.  Patient has GERD controlled on her meds.        In Jan 2024, patient had a RUL wedge resection by Dr. Dorris Fetch for small cell lung ca ( Stage Ia T1b, N0, M0 ) & is followed by Dr Shirline Frees in active surveillance.          HTN predates circa  1990's. Patient's BP has been controlled at home and patient denies any cardiac symptoms as chest pain, palpitations, shortness of breath, dizziness or ankle swelling. Today's BP is 122/78 .          Patient's hyperlipidemia is not controlled with diet and Ezetimibe Rosuvastatin was represcribed at [facility] , but patient is reticent to take Cholesterol meds . Patient denies myalgias or other medication SE's. Last lipids were near goal :  Lab Results  Component Value Date   CHOL 226 (H) 02/18/2023   HDL 75 02/18/2023   LDLCALC 121 (H) 02/18/2023   TRIG 183 (H) 02/18/2023   CHOLHDL 3.0 02/18/2023         Patient has hx/o prediabetes (A1c 5.8% /2011) and patient denies reactive hypoglycemic symptoms, visual blurring, diabetic polys or paresthesias. Last A1c was at goal :  Lab Results  Component Value Date   HGBA1C 5.5  02/18/2023        Finally, patient has history of Vitamin D Deficiency ("36" /2008) and last Vitamin D was at goal :  Lab Results  Component Value Date   VD25OH 35 02/18/2023        Current Outpatient Medications:    atenolol (TENORMIN) 25 MG tablet, Take 1 tablet (25 mg total) by mouth daily., Disp: 90 tablet, Rfl: 0   clomiPRAMINE (ANAFRANIL) 50 MG capsule, Take 1 capsule (50 mg total) by mouth at bedtime., Disp: 90 capsule, Rfl: 2   clonazePAM (KLONOPIN) 1 MG tablet, Take 1.5 tablets (1.5 mg total) by mouth at bedtime., Disp: 45 tablet, Rfl: 5   ezetimibe (ZETIA) 10 MG tablet, Take  1 tablet  Daily  for Cholesterol, Disp: 90 tablet, Rfl: 3   Propylene Glycol (SYSTANE BALANCE) 0.6 % SOLN, Place 1 drop into both eyes as needed (dry eyes)., Disp: , Rfl:    QUEtiapine (SEROQUEL) 25 MG tablet, Take 1 tablet (25 mg total) by mouth at bedtime., Disp: 90 tablet, Rfl: 1   gabapentin (NEURONTIN) 300  MG capsule, Take 1 capsule (300 mg total) by mouth at bedtime. (Patient not taking: Reported on 08/25/2023), Disp: 30 capsule, Rfl: 1    Allergies  Allergen Reactions   Prednisone     Altered mental state   Penicillins    Asa [Aspirin] Diarrhea   Levaquin [Levofloxacin] Other (See Comments)    Joint Pain   Macrolides And Ketolides Nausea And Vomiting   Paxil [Paroxetine Hcl] Nausea And Vomiting   Tetracyclines & Related Nausea And Vomiting   Zoloft [Sertraline Hcl] Nausea And Vomiting   Sulfa Antibiotics Rash   Trimethoprim Rash     Past Medical History:  Diagnosis Date   Anemia    mild   Anxiety    Colon polyp    hyperplastic   Diverticulosis    Esophageal stricture    GERD (gastroesophageal reflux disease)    no s/s now- off medicines   Hyperlipidemia    Palpitations    Prediabetes    Tachycardia      Health Maintenance  Topic Date Due   Zoster Vaccines- Shingrix (1 of 2) Never done   COVID-19 Vaccine (3 - Pfizer risk series) 04/17/2020   MAMMOGRAM  07/08/2020    COLONOSCOPY  03/05/2021   INFLUENZA VACCINE  07/09/2021   TETANUS/TDAP  12/04/2027   DEXA SCAN  Completed   Hepatitis C Screening  Completed   PNA vac Low Risk Adult  Completed   HPV VACCINES  Aged Out     Immunization History  Administered Date(s) Administered   Influenza, High Dose  10/10/2016, 12/03/2017, 11/03/2018   PFIZER - SARS-COV-2 Vacc 02/24/2020, 03/20/2020   Pneumococcal -13 09/21/2015   Pneumococcal -23 02/14/2009, 05/13/2017   Td 12/11/2006, 12/03/2017   Tdap 12/09/2005   Zoster, Live 02/27/2011    Last Colon - 03/05/2016 - Dr Marina Goodell - Recc 5 yr f/u - due Apr 2022 - patient aware overdue   Last MGM - 07/09/2019 - Patient aware overdue & plans to schedule   Past Surgical History:  Procedure Laterality Date   BREAST BIOPSY Left    CESAREAN SECTION     x1   COLONOSCOPY  2017   eye lift     POLYPECTOMY     TONSILLECTOMY     UPPER GASTROINTESTINAL ENDOSCOPY       Family History  Problem Relation Age of Onset   Kidney cancer Father    Hypertension Mother    Heart disease Mother    Colon cancer Paternal Aunt    Colon polyps Neg Hx    Esophageal cancer Neg Hx    Rectal cancer Neg Hx    Stomach cancer Neg Hx      Social History   Tobacco Use   Smoking status: Never   Smokeless tobacco: Never  Vaping Use   Vaping Use: Never used  Substance Use Topics   Alcohol use: No    Alcohol/week: 0.0 standard drinks   Drug use: No      ROS Constitutional: Denies fever, chills, weight loss/gain, headaches, insomnia,  night sweats, and change in appetite. Does c/o fatigue. Eyes: Denies redness, blurred vision, diplopia, discharge, itchy, watery eyes.  ENT: Denies discharge, congestion, post nasal drip, epistaxis, sore throat, earache, hearing loss, dental pain, Tinnitus, Vertigo, Sinus pain, snoring.  Cardio: Denies chest pain, palpitations, irregular heartbeat, syncope, dyspnea, diaphoresis, orthopnea, PND, claudication, edema Respiratory: denies cough,  dyspnea, DOE, pleurisy, hoarseness, laryngitis, wheezing.  Gastrointestinal: Denies dysphagia, heartburn, reflux, water brash, pain, cramps, nausea, vomiting, bloating, diarrhea,  constipation, hematemesis, melena, hematochezia, jaundice, hemorrhoids Genitourinary: Denies dysuria, frequency, urgency, nocturia, hesitancy, discharge, hematuria, flank pain Breast: Breast lumps, nipple discharge, bleeding.  Musculoskeletal: Denies arthralgia, myalgia, stiffness, Jt. Swelling, pain, limp, and strain/sprain. Denies falls. Skin: Denies puritis, rash, hives, warts, acne, eczema, changing in skin lesion Neuro: No weakness, tremor, incoordination, spasms, paresthesia, pain Psychiatric: Denies confusion, memory loss, sensory loss. Denies Depression. Endocrine: Denies change in weight, skin, hair change, nocturia, and paresthesia, diabetic polys, visual blurring, hyper / hypo glycemic episodes.  Heme/Lymph: No excessive bleeding, bruising, enlarged lymph nodes.  Physical Exam  BP 122/78   Pulse 96   Temp 97.9 F (36.6 C)   Resp 17   Ht 5' 6.5" (1.689 m)   Wt 154 lb 3.2 oz (69.9 kg)   LMP 12/09/1994   SpO2 97%   BMI 24.52 kg/m   General Appearance: Well nourished, well groomed and in no apparent distress.  Eyes: PERRLA, EOMs, conjunctiva no swelling or erythema, normal fundi and vessels. Sinuses: No frontal/maxillary tenderness ENT/Mouth: EACs patent / TMs  nl. Nares clear without erythema, swelling, mucoid exudates. Oral hygiene is good. No erythema, swelling, or exudate. Tongue normal, non-obstructing. Tonsils not swollen or erythematous. Hearing normal.  Neck: Supple, thyroid not palpable. No bruits, nodes or JVD. Respiratory: Respiratory effort normal.  BS equal and clear bilateral without rales, rhonci, wheezing or stridor. Cardio: Heart sounds are normal with regular rate and rhythm and no murmurs, rubs or gallops. Peripheral pulses are normal and equal bilaterally without edema. No aortic or  femoral bruits. Chest: symmetric with normal excursions and percussion. Breasts: Symmetric, without lumps, nipple discharge, retractions, or fibrocystic changes.  Abdomen: Flat, soft with bowel sounds active. Nontender, no guarding, rebound, hernias, masses, or organomegaly.  Lymphatics: Non tender without lymphadenopathy.  Musculoskeletal: Full ROM all peripheral extremities, joint stability, 5/5 strength, and normal gait. Skin: Warm and dry without rashes, lesions, cyanosis, clubbing or  ecchymosis.  Neuro: Cranial nerves intact, reflexes equal bilaterally. Normal muscle tone, no cerebellar symptoms. Sensation intact.  Pysch: Alert and oriented X 3, normal affect, Insight and Judgment appropriate.    Assessment and Plan  1. Annual Preventative Screening Examination   2. Labile hypertension  - EKG 12-Lead - Urinalysis, Routine w reflex microscopic - Microalbumin / creatinine urine ratio - Magnesium - TSH   3. Hyperlipidemia, mixed  - EKG 12-Lead - Cardio IQ (R) Advanced Lipid Panel; Future - TSH   4. Abnormal glucose  - EKG 12-Lead - Hemoglobin A1c - Insulin, random   5. Vitamin D deficiency  - VITAMIN D 25 Hydroxy    6. Major depressive disorder in remission(HCC)  - TSH   7. Screening for colorectal cancer  - POC Hemoccult Bld/Stl    8. Screening for heart disease  - EKG 12-Lead   9. FHx: heart disease  - EKG 12-Lead   10. S/P partial lobectomy of lung   11. Medication management  - Urinalysis, Routine w reflex microscopic - Microalbumin / creatinine urine ratio - Magnesium - Cardio IQ (R) Advanced Lipid Panel; Future - TSH - Hemoglobin A1c - Insulin, random - VITAMIN D 25 Hydroxy          Patient was counseled in prudent diet to achieve/maintain BMI less than 25 for weight control, BP monitoring, regular exercise and medications. Discussed med's effects and SE's. Screening labs and tests as requested with regular follow-up as  recommended. Over 40 minutes of exam, counseling, chart review and high complex critical decision making was  performed.   Marinus Maw, MD

## 2023-08-26 LAB — INSULIN, RANDOM: Insulin: 17.1 u[IU]/mL

## 2023-08-26 LAB — URINALYSIS, ROUTINE W REFLEX MICROSCOPIC
Bacteria, UA: NONE SEEN /HPF
Bilirubin Urine: NEGATIVE
Glucose, UA: NEGATIVE
Hgb urine dipstick: NEGATIVE
Hyaline Cast: NONE SEEN /LPF
Ketones, ur: NEGATIVE
Nitrite: NEGATIVE
Protein, ur: NEGATIVE
RBC / HPF: NONE SEEN /HPF (ref 0–2)
Specific Gravity, Urine: 1.02 (ref 1.001–1.035)
Squamous Epithelial / HPF: NONE SEEN /HPF (ref <=5)
pH: 7 (ref 5.0–8.0)

## 2023-08-26 LAB — MAGNESIUM: Magnesium: 2.2 mg/dL (ref 1.5–2.5)

## 2023-08-26 LAB — HEMOGLOBIN A1C
Hgb A1c MFr Bld: 5.6 %{Hb} (ref <5.7)
Mean Plasma Glucose: 114 mg/dL
eAG (mmol/L): 6.3 mmol/L

## 2023-08-26 LAB — MICROALBUMIN / CREATININE URINE RATIO
Creatinine, Urine: 154 mg/dL (ref 20–275)
Microalb Creat Ratio: 8 mg/g{creat} (ref <30)
Microalb, Ur: 1.2 mg/dL

## 2023-08-26 LAB — MICROSCOPIC MESSAGE

## 2023-08-26 LAB — VITAMIN D 25 HYDROXY (VIT D DEFICIENCY, FRACTURES): Vit D, 25-Hydroxy: 45 ng/mL (ref 30–100)

## 2023-08-26 LAB — TSH: TSH: 1.82 m[IU]/L (ref 0.40–4.50)

## 2023-08-26 NOTE — Progress Notes (Signed)
<>*<>*<>*<>*<>*<>*<>*<>*<>*<>*<>*<>*<>*<>*<>*<>*<>*<>*<>*<>*<>*<>*<>*<>*<> <>*<>*<>*<>*<>*<>*<>*<>*<>*<>*<>*<>*<>*<>*<>*<>*<>*<>*<>*<>*<>*<>*<>*<>*<>  -  Test results slightly outside the reference range are not unusual. If there is anything important, I will review this with you,  otherwise it is considered normal test values.  If you have further questions,  please do not hesitate to contact me at the office or via My Chart.   <>*<>*<>*<>*<>*<>*<>*<>*<>*<>*<>*<>*<>*<>*<>*<>*<>*<>*<>*<>*<>*<>*<>*<>*<> <>*<>*<>*<>*<>*<>*<>*<>*<>*<>*<>*<>*<>*<>*<>*<>*<>*<>*<>*<>*<>*<>*<>*<>*<>  - U/A - OK - No Infection   <>*<>*<>*<>*<>*<>*<>*<>*<>*<>*<>*<>*<>*<>*<>*<>*<>*<>*<>*<>*<>*<>*<>*<>*<> <>*<>*<>*<>*<>*<>*<>*<>*<>*<>*<>*<>*<>*<>*<>*<>*<>*<>*<>*<>*<>*<>*<>*<>*<>  -  A1c -Normal -No Diabetes   <>*<>*<>*<>*<>*<>*<>*<>*<>*<>*<>*<>*<>*<>*<>*<>*<>*<>*<>*<>*<>*<>*<>*<>*<> <>*<>*<>*<>*<>*<>*<>*<>*<>*<>*<>*<>*<>*<>*<>*<>*<>*<>*<>*<>*<>*<>*<>*<>*<>  -  Vitamin D = 45 - verty low   - Vitamin D goal is between 70-100.   - Please Take  Vitamin D 5,000 unit capsule every day   - It is very important as a natural anti-inflammatory and helping the                           immune system protect against viral infections, like the Covid-19    helping hair, skin, and nails, as well as reducing stroke and heart attack risk.   - It helps your bones and helps with mood.  - It also decreases numerous cancer risks so please                                                                                           take it as directed.   - Low Vit D is associated with a 200-300% higher risk for CANCER   and 200-300% higher risk for HEART   ATTACK  &  STROKE.    - It is also associated with higher death rate at younger ages,   autoimmune diseases like Rheumatoid arthritis, Lupus, Multiple Sclerosis.     - Also many other serious conditions, like depression, Alzheimer's  Dementia,  muscle aches, fatigue,  fibromyalgia   <>*<>*<>*<>*<>*<>*<>*<>*<>*<>*<>*<>*<>*<>*<>*<>*<>*<>*<>*<>*<>*<>*<>*<>*<> <>*<>*<>*<>*<>*<>*<>*<>*<>*<>*<>*<>*<>*<>*<>*<>*<>*<>*<>*<>*<>*<>*<>*<>*<>  -  All Else - CBC - Kidneys - Electrolytes - Liver - Magnesium & Thyroid    - all  Normal / OK  <>*<>*<>*<>*<>*<>*<>*<>*<>*<>*<>*<>*<>*<>*<>*<>*<>*<>*<>*<>*<>*<>*<>*<>*<> <>*<>*<>*<>*<>*<>*<>*<>*<>*<>*<>*<>*<>*<>*<>*<>*<>*<>*<>*<>*<>*<>*<>*<>*<>

## 2023-11-25 ENCOUNTER — Encounter: Payer: Self-pay | Admitting: Psychiatry

## 2023-11-25 ENCOUNTER — Ambulatory Visit: Payer: Medicare PPO | Admitting: Psychiatry

## 2023-11-25 DIAGNOSIS — F411 Generalized anxiety disorder: Secondary | ICD-10-CM

## 2023-11-25 DIAGNOSIS — F422 Mixed obsessional thoughts and acts: Secondary | ICD-10-CM

## 2023-11-25 DIAGNOSIS — F3342 Major depressive disorder, recurrent, in full remission: Secondary | ICD-10-CM | POA: Diagnosis not present

## 2023-11-25 DIAGNOSIS — F5105 Insomnia due to other mental disorder: Secondary | ICD-10-CM | POA: Diagnosis not present

## 2023-11-25 MED ORDER — CLOMIPRAMINE HCL 50 MG PO CAPS
50.0000 mg | ORAL_CAPSULE | Freq: Every day | ORAL | 2 refills | Status: AC
Start: 2023-11-25 — End: ?

## 2023-11-25 MED ORDER — QUETIAPINE FUMARATE 25 MG PO TABS
25.0000 mg | ORAL_TABLET | Freq: Every day | ORAL | 1 refills | Status: DC
Start: 2023-11-25 — End: 2023-11-25

## 2023-11-25 MED ORDER — QUETIAPINE FUMARATE 25 MG PO TABS
25.0000 mg | ORAL_TABLET | Freq: Every day | ORAL | 1 refills | Status: AC
Start: 2023-11-25 — End: ?

## 2023-11-25 MED ORDER — CLOMIPRAMINE HCL 50 MG PO CAPS
50.0000 mg | ORAL_CAPSULE | Freq: Every day | ORAL | 2 refills | Status: DC
Start: 2023-11-25 — End: 2023-11-25

## 2023-11-25 MED ORDER — CLONAZEPAM 1 MG PO TABS: 1.5000 mg | ORAL_TABLET | Freq: Every day | ORAL | 5 refills | Status: DC

## 2023-11-25 NOTE — Progress Notes (Signed)
Donna Dillon [id number] December 15, 1945 77 y.o.  Subjective:   Patient ID:  Donna Dillon is a 77 y.o. (DOB 04/24/1946) female.  Chief Complaint:  Chief Complaint  Patient presents with   Follow-up   Anxiety    Depression        Associated symptoms include fatigue.  Associated symptoms include no decreased concentration, no appetite change and no suicidal ideas.  Past medical history includes anxiety.   Anxiety Symptoms include nervous/anxious behavior and shortness of breath. Patient reports no confusion, decreased concentration, dizziness or suicidal ideas.     Donna Dillon presents to the office today for follow-up of OCD.    seen December 2020.  No meds were changed.  seen Apr 10, 2020.  The following is noted: She is markedly worse. "falling apart".  More fatigued since dilation of esophagus.  January more dry eyes.  Hair falling out.  TSH is normal.   More sleep problems since Feb. Called and increased clonazepam to 2 mg HS bc also the insomnia was causing more anxiety.  Now also scared of taking the clonazepam.   Added trazodone 100 mg and it didn't help sleep. More obsessed with heart rate being elevated to 100 bpm.   Can't work DT anxiety and insomnia and fear over her heart. Scared.  She was ruminating. Plan:Increase atenolol for anxiety and tachycardia to 25 mg daily. DC trazodone Seroquel 25-50 mg for sleep Needs increased BZ .  She is afraid increasing the clonazepam but may be willing to take Xanax during the day.  Stay out of work this week. Disc option of Day treatment bc severity of sx and she's afraid of being alone. Option call Behavioral Health Assessment at the main [facility] phone.  Tell them I referred you to the Day treatment program. Return to office in 4 days.  04/14/2020 appointment the following is noted: Took 37.5 mg Seroquel Monday with 2 mg Klonopin and awoke in 3 hour. Afterwards usually took Seroquel 50 mg since then with Klonopin.  Lost another  1# and total of 11#/3 weeks.  Eating after taking alprazolam.  Can't quilt or read or focus on TV.  Heart rate is better after increase atenolol to 25 mg daily. Now 5-6 hours of sleep. [name]. Plan: Start clomipramine 25 mg 1 capsule for 10 days, then 2 each night or 50 mg nightly. Continue Klonopin 2 mg HS Increase Seroquel 75 mg for sleep and anxiety and rumination Needs increased BZ .  She is afraid increasing the clonazepam but may be willing to take Xanax during the day.  Ok continue Xanax daytime for anxiety and allow her to eat..  Stay out of work right now.  Is on medical leave. Disc option of Day treatment bc severity of sx and she's afraid of being alone. Option call Behavioral Health Assessment at the main [facility] phone.  Tell them I referred you to the Day treatment program.  04/21/2020 urgent appointment, the following is noted: Generally sleeping better with quetiapine 75 mg HS.  Getting hangover.  Has to take Xanax to eat.  Losing weight 10#.  Just wants to lay down.  Feels shakey and weak. Did get restarted on clomipramine 25 mg HS.  Fatigue and hard to concentrate.   Plan: Continue medical leave until May 29, 2020.   The following med decisions were made: Continue clomipramine 25 mg 1 capsule for 10 days, then 2 each night or 50 mg  nightly. Continue Klonopin 2 mg HS Increase Seroquel 75 mg for sleep and anxiety and rumination   05/03/2020 appointment urgently with the following noted:  Doesn't feel better yet.  Seroquel 75 gave her hangover.  With 50 mg usually 8 hours but some EMA esp if anxiety is bad.  Lays in bed until noon bc doesn't want to do anything  In bed to escape. When first started clomipramine 25 had sig insomnia.  At 50 mg now Appetite still poor and lost 10#.  Forcing food for 6 weeks. Idleness is not good for worry but can't focus well enough to read as much as she would usually otherwise bc anxiety  interferes. Taking alprazolam 0.5 mg in pm to help eat dinner.  Getting from [place]. No med changes  05/18/20 appt with the following noted:  Another urgent appt DT severity of anxiety. On clomipramine 50 mg since about 5/14. Appetite not better yet.  Lost total 12-13#.   Not depressed. Feels sleepy.  Always tired.  Wonders if there's medical cause. Sleeping a lot with Seroquel 50 mg HS. Notices less anxiety by "a little".   A little more obsessions since being at home but not severe. Not currently physically able to do the job and embarrassed about being out of work.  Disc concerns about her age and work and wondering about retirement from it fully.  No med changes.  06/07/2020 appointment with the following noted: Sleeping well with clonazepam 2 mg HS and quetiapine 50 mg HS. Still on clomipramine 50 since 04/21/20. Still exhausted from going to grocery store.  Not mentally exhausted but is physically.   Has remained on medical leave but not covered by FMLA bc not working FT. Decided to resign.  Doesn't feel able to do it yet.  Hopes to get to volunteer in the fall. Naps a good deal. Plan: Continue clomipramine 50 mg nightly (started about 5/14) Continue Klonopin 2 mg HS Continue Seroquel 25-50 mg for sleep and anxiety and rumination .  Try reducing as soon as she can to help energy. Ok continue Xanax daytime for anxiety and allow her to eat..  Meds won't work if you don't get enough protein.  08/10/20 appt with the following noted:  Nothing to do but gave job up bc didn't think she could do it physically. Did clean her house well.   Clomipramine has kicked in but hard time bc nothing to divert her attention.   No anxiety until married. After 5-6 years later got osbessions.  Never compulsions. Not depressed or despairing anymore. Thinking of 12 hour a week job and needs it to get out of the house.  Needs stimulation and activity. Plan: no med changes.  11/13/2020 appointment with  the following noted: Stopped quetiapine a week ago.  Sleep is OK with a little more awakening but not severe.   Some chronic OCD but minimal some chronic anxiety but manageable.  Not depressed.  Sleep is adequate Son Gaynell Face doing well with promotion at [employer] managing 20 people. Surprisingly well. Still has compulsions including night eating.  Married since 2016.  Hasn't travelled lately but wants to.  [place]. [place].  Has a dog. Plan no med changes:   03/15/2021 appointment with the following noted: A lot of changes in the last year.  Health decline in physical ability to walk.  No particular reason. Lost job and friends at the job.  Lost dog of 12 years.  [place]26[place]23[place]   Was going to volunteer but didn't DT covid. Dx advancing COPD despite not smoking.  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 Clonazepam helps her and needs the 2 mg dose.  Afraid to reduce it.  Insurance won't cover. Needed to increase clonazepam to 2 mg HS 1 year ago and sleeping fine. No seroquel needed. Not depressed. Son eccentric but genius. Continue clomipramine 50 mg nightly (started about 04/2013) Continue Klonopin 2 mg HS for now but hope to gradually reduce it with goal of about 1 mg eventually.  Unlikely to be able to stop it. Disc way to reduce it in detail. No  med changes today.   09/13/2021 appointment with the following noted: Still can't find things to do or places to volunteer.  Need something to do.   Son Gaynell Face works for [employer] and works from home. OCD manageable but not 100% gone. Disc concerns about heart rate.  It's less than 100. Not exercising, but used to. EKG yearly but still worries her. Patient reports stable mood and denies depressed or irritable moods.  Patient denies any recent difficulty with anxiety.  Patient denies difficulty with sleep initiation or maintenance. Denies appetite disturbance.  Patient reports that energy and motivation  have been good.  Patient denies any difficulty with concentration.  Patient denies any suicidal ideation. Plan no med changes  05/14/2022 appointment with the following noted: No med changes. Chronic tiredness to some degree.  Sleep good.   Gets SOB with exertion.  More physical problems.  Dental problems, gingivitis.  Some leg swelling. Sleep well.  Not very active.  Pending card referral.  Not sleepy in the AM Son getting divorced but he's not good socially.  Pt making him dinner.  11/13/22 appt noted: Some med issues with clonazepam RF.  Was still getting 2 mg size. Taking clonazepam 1.5 mg HS and clomipramine 50 mg HS and a little quetiapine.   Quetiapine helps sleep at 12.5 mg HS. Has had some health problems and including pulm nodule and needs Bx.  Pending and likely lobectomy.  Disc stress of it and doesn't feel she can reduce the clonazepam. Feels SOB and tired.  Coughs in the AM when gets up. Not sig depressed.    05/15/23 appt noted: Taking clonazepam 1.5 mg HS and clomipramine 50 mg HS and a little quetiapine.   Quetiapine helps sleep at 12.5 mg HS. Quetiapine helps sleep a lot.  Doesn't sleep well enough with clonazepam alone.   Had a rough time bc Had lung 9 mm adenocarcinoma resected in January.   Couldn't sleep while in the hospital.  Get "crazy" if can't sleep.  Was very stressful.  Doesn't feels she has fully recovered.  Hs some numbness from the nerve block.   Has not had energy to do things but so bored and needs more activity.   Some fears of recurrence.   Tiredness gradually better but still a problems. SOB with exertion and coughing.  Worries over her heart and lungs.     11/25/23 appt noted: Taking clonazepam 1.25 mg HS and clomipramine 50 mg HS and a little quetiapine.   Quetiapine helps sleep at 12.5 mg HS. Multiple stressors health wise, hair loss, skin dry. B died and beloved Whippet died.  Water damage at home.   Had lung carcinoadenoma of lung and had  surgery.   Wakes at night to urinate but seems to need an extraordinary amount of sleep. So bored all the time and  can't find anything to do that she can hold up to do. Can't get up in time to volunteer at school.  Feels better at night and so wants to stay up at night and then sleep late. Mitzie Na won't travel.  Can't fly to [place] physically.   Frustrated she doesn't have grand kids.   Did ok with reduction in clonazepam.  Would like to try to go further  Past Psychiatric Medication Trials:  Clomipramine,   Poor response SSRIs  Trazodone 100 NR Seroquel 75 hangover Clonazepam 1-2 mg HS  Review of Systems:  Review of Systems  Constitutional:  Positive for fatigue. Negative for appetite change and unexpected weight change.       Lost 10-12 # in 3 weeks  HENT:  Positive for dental problem.   Respiratory:  Positive for shortness of breath.   Cardiovascular:  Positive for leg swelling.  Gastrointestinal:  Negative for abdominal pain.  Neurological:  Negative for dizziness and tremors.  Psychiatric/Behavioral:  Negative for agitation, behavioral problems, confusion, decreased concentration, dysphoric mood, hallucinations, self-injury, sleep disturbance and suicidal ideas. The patient is nervous/anxious. The patient is not hyperactive.     Medications: I have reviewed the patient's current medications.  Current Outpatient Medications  Medication Sig Dispense Refill   atenolol (TENORMIN) 25 MG tablet Take 1 tablet (25 mg total) by mouth daily. 90 tablet 0   ezetimibe (ZETIA) 10 MG tablet Take  1 tablet  Daily  for Cholesterol 90 tablet 3   gabapentin (NEURONTIN) 300 MG capsule Take 1 capsule (300 mg total) by mouth at bedtime. 30 capsule 1   Propylene Glycol (SYSTANE BALANCE) 0.6 % SOLN Place 1 drop into both eyes as needed (dry eyes).     clomiPRAMINE (ANAFRANIL) 50 MG capsule Take 1 capsule (50 mg total) by mouth at bedtime. 90 capsule 2   clonazePAM (KLONOPIN) 1 MG tablet Take 1.5  tablets (1.5 mg total) by mouth at bedtime. 45 tablet 5   QUEtiapine (SEROQUEL) 25 MG tablet Take 1 tablet (25 mg total) by mouth at bedtime. 90 tablet 1   No current facility-administered medications for this visit.    Medication Side Effects: None, dry  Allergies:  Allergies  Allergen Reactions   Prednisone     Altered mental state   Penicillins Hives and Itching   Asa [Aspirin] Diarrhea   Levaquin [Levofloxacin] Other (See Comments)    Joint Pain   Macrolides And Ketolides Nausea And Vomiting   Paxil [Paroxetine Hcl] Nausea And Vomiting   Tetracyclines & Related Nausea And Vomiting   Zoloft [Sertraline Hcl] Nausea And Vomiting   Sulfa Antibiotics Rash   Trimethoprim Rash    Past Medical History:  Diagnosis Date   Anemia    mild   Anxiety    Colon polyp    hyperplastic   Diverticulosis    Esophageal stricture    GERD (gastroesophageal reflux disease)    no s/s now- off medicines   Hyperlipidemia    Palpitations    Pneumonia    PONV (postoperative nausea and vomiting)    Prediabetes    Tachycardia     Family History  Problem Relation Age of Onset   Hypertension Mother    Heart disease Mother    Kidney cancer Father    Emphysema Father        smoked   Colon cancer Paternal Aunt    Colon polyps Neg Hx    Esophageal cancer Neg Hx    Rectal cancer Neg  Hx    Stomach cancer Neg Hx     Social History   Socioeconomic History   Marital status: Married    Spouse name: Not on file   Number of children: 1   Years of education: Not on file   Highest education level: Not on file  Occupational History   Occupation: Librarian  Tobacco Use   Smoking status: Never    Passive exposure: Past   Smokeless tobacco: Never  Vaping Use   Vaping status: Never Used  Substance and Sexual Activity   Alcohol use: No    Alcohol/week: 0.0 standard drinks of alcohol   Drug use: No   Sexual activity: Not on file  Other Topics Concern   Not on file  Social History  Narrative   Not on file   Social Drivers of Health   Financial Resource Strain: Not on file  Food Insecurity: Not on file  Transportation Needs: Not on file  Physical Activity: Not on file  Stress: Not on file  Social Connections: Not on file  Intimate Partner Violence: Not on file    Past Medical History, Surgical history, Social history, and Family history were reviewed and updated as appropriate.   Son has severe anxiety and is treated also.  Please see review of systems for further details on the patient's review from today.   Objective:   Physical Exam:  LMP 12/09/1994   Physical Exam Constitutional:      General: She is not in acute distress.    Appearance: She is well-developed.  Musculoskeletal:        General: No deformity.  Neurological:     Mental Status: She is alert and oriented to person, place, and time.     Motor: No tremor.     Coordination: Coordination normal.     Gait: Gait normal.  Psychiatric:        Attention and Perception: She is attentive. She does not perceive auditory hallucinations.        Mood and Affect: Mood is anxious. Mood is not depressed. Affect is not labile, blunt, angry, tearful or inappropriate.        Speech: Speech normal. Speech is not slurred.        Behavior: Behavior normal.        Thought Content: Thought content normal. Thought content is not delusional. Thought content does not include homicidal or suicidal ideation. Thought content does not include suicidal plan.        Cognition and Memory: Cognition normal.        Judgment: Judgment normal.     Comments: Insight intact. Residual obsessions if not busy.. No auditory or visual hallucinations. No delusions.  No rumination  Neat .  No sedation. Pleasant and talkative, fluent      Lab Review:     Component Value Date/Time   NA 139 07/30/2023 1454   K 4.7 07/30/2023 1454   CL 103 07/30/2023 1454   CO2 25 07/30/2023 1454   GLUCOSE 108 (H) 07/30/2023 1454   BUN  10 07/30/2023 1454   CREATININE 0.78 07/30/2023 1454   CREATININE 0.82 02/18/2023 1551   CALCIUM 9.5 07/30/2023 1454   PROT 7.0 07/30/2023 1454   ALBUMIN 4.0 07/30/2023 1454   AST 14 (L) 07/30/2023 1454   ALT 21 07/30/2023 1454   ALKPHOS 72 07/30/2023 1454   BILITOT 0.6 07/30/2023 1454   GFRNONAA >60 07/30/2023 1454   GFRNONAA 77 02/15/2021 1546   GFRAA 89 02/15/2021 1546  Component Value Date/Time   WBC 6.8 07/30/2023 1454   WBC 7.3 02/18/2023 1551   RBC 4.72 07/30/2023 1454   HGB 13.9 07/30/2023 1454   HCT 42.7 07/30/2023 1454   PLT 258 07/30/2023 1454   MCV 90.5 07/30/2023 1454   MCH 29.4 07/30/2023 1454   MCHC 32.6 07/30/2023 1454   RDW 12.9 07/30/2023 1454   LYMPHSABS 2.3 07/30/2023 1454   MONOABS 0.5 07/30/2023 1454   EOSABS 0.2 07/30/2023 1454   BASOSABS 0.1 07/30/2023 1454    11/2022 EKG was normal.  .res Assessment: Plan:    Mixed obsessional thoughts and acts - Plan: clonazePAM (KLONOPIN) 1 MG tablet, clomiPRAMINE (ANAFRANIL) 50 MG capsule, DISCONTINUED: clomiPRAMINE (ANAFRANIL) 50 MG capsule  Generalized anxiety disorder - Plan: clonazePAM (KLONOPIN) 1 MG tablet, clomiPRAMINE (ANAFRANIL) 50 MG capsule, QUEtiapine (SEROQUEL) 25 MG tablet, DISCONTINUED: clomiPRAMINE (ANAFRANIL) 50 MG capsule, DISCONTINUED: QUEtiapine (SEROQUEL) 25 MG tablet  Insomnia due to mental condition - Plan: clonazePAM (KLONOPIN) 1 MG tablet, QUEtiapine (SEROQUEL) 25 MG tablet, DISCONTINUED: QUEtiapine (SEROQUEL) 25 MG tablet  Major depression, recurrent, full remission (HCC)    Marked  worsening of anxiety and depresssion without obvious worsening of OCD after STOPPING CLOMIPRAMINE ACCIDENTALLY  in 2020.  40 min appt with 20 min counseling around trauma of cancer dx.    But cannot sleep without clonazepam .   Disc risks TCA including cardiac and risk increasing resting heart rate.  Last EKG ok. Will renew her atenolol per PCP for this.  We discussed the short-term risks  associated with benzodiazepines including sedation and increased fall risk among others.  Discussed long-term side effect risk including dependence, potential withdrawal symptoms, and the potential eventual dose-related risk of dementia.  But recent studies from 2020 dispute this association between benzodiazepines and dementia risk. Newer studies in 2020 do not support an association with dementia. Disc her fears of addiction.  Wants to eventually consider tapering off of it using the quetiapine to protect her sleep.  Disc her fears of dose reduction but it is time before she has SE related to age and the BZ.  Extensive discussion.  Counseling 23 min: Supportive therapy and CBT on relapse prevention and what to do to deal with multiple stressors and cannot work to distract herself.  Needs activity to manage her anxiety.  Problem solved and disc volunteer options to help her feel more useful.   Would prefer to stay up late and sleep late.    Continue clomipramine 50 mg nightly (started about 04/2013)  Rec trial of reducing Klonopin to 1.5 mg HS bc of age increase risk of SE. Unlikely to be able to stop it. But trial reduce to 1.25 mg recommended. Disc slow taper process.    Continue Seroquel 25 mg HS for TR insomnia.  Asks I renew atenolol 25 mg bc lost her bottle  FU 6 mos   Meredith Staggers, MD, DFAPA   Please see After Visit Summary for patient specific instructions.  Future Appointments  Date Time Provider Department Center  11/26/2023  2:30 PM Adela Glimpse, NP [facility] None  01/28/2024  3:00 PM [facility] None  02/05/2024  3:15 PM Si Gaul, MD [facility] None  03/01/2024  2:30 PM Lucky Cowboy, MD [facility] None  06/02/2024  3:30 PM Adela Glimpse, NP [facility] None  09/06/2024  2:00 PM Lucky Cowboy, MD [facility] None  11/25/2024 11:00 AM Adela Glimpse, NP [facility] None    No orders of the defined types were placed in this  encounter.      -------------------------------

## 2023-11-26 ENCOUNTER — Ambulatory Visit (INDEPENDENT_AMBULATORY_CARE_PROVIDER_SITE_OTHER): Payer: Medicare PPO | Admitting: Nurse Practitioner

## 2023-11-26 ENCOUNTER — Encounter: Payer: Self-pay | Admitting: Nurse Practitioner

## 2023-11-26 VITALS — BP 110/72 | HR 69 | Temp 97.8°F | Ht 66.5 in | Wt 157.0 lb

## 2023-11-26 DIAGNOSIS — Z79899 Other long term (current) drug therapy: Secondary | ICD-10-CM | POA: Diagnosis not present

## 2023-11-26 DIAGNOSIS — K219 Gastro-esophageal reflux disease without esophagitis: Secondary | ICD-10-CM

## 2023-11-26 DIAGNOSIS — R0989 Other specified symptoms and signs involving the circulatory and respiratory systems: Secondary | ICD-10-CM | POA: Diagnosis not present

## 2023-11-26 DIAGNOSIS — E559 Vitamin D deficiency, unspecified: Secondary | ICD-10-CM | POA: Diagnosis not present

## 2023-11-26 DIAGNOSIS — M8580 Other specified disorders of bone density and structure, unspecified site: Secondary | ICD-10-CM

## 2023-11-26 DIAGNOSIS — E782 Mixed hyperlipidemia: Secondary | ICD-10-CM

## 2023-11-26 DIAGNOSIS — Z0001 Encounter for general adult medical examination with abnormal findings: Secondary | ICD-10-CM

## 2023-11-26 DIAGNOSIS — R6889 Other general symptoms and signs: Secondary | ICD-10-CM

## 2023-11-26 DIAGNOSIS — Z1231 Encounter for screening mammogram for malignant neoplasm of breast: Secondary | ICD-10-CM

## 2023-11-26 DIAGNOSIS — Z6823 Body mass index (BMI) 23.0-23.9, adult: Secondary | ICD-10-CM

## 2023-11-26 DIAGNOSIS — Z0000 Encounter for general adult medical examination without abnormal findings: Secondary | ICD-10-CM

## 2023-11-26 DIAGNOSIS — R7309 Other abnormal glucose: Secondary | ICD-10-CM

## 2023-11-26 DIAGNOSIS — F428 Other obsessive-compulsive disorder: Secondary | ICD-10-CM

## 2023-11-26 DIAGNOSIS — K222 Esophageal obstruction: Secondary | ICD-10-CM | POA: Diagnosis not present

## 2023-11-26 NOTE — Progress Notes (Signed)
MEDICARE ANNUAL WELLNESS VISIT AND FOLLOW UP Assessment:   Diagnoses and all orders for this visit:  Medicare annual wellness visit, subsequent Due annually  Health maintenance reviewed Healthily lifestyle goals set  Essential hypertension / tachycardia Discussed DASH (Dietary Approaches to Stop Hypertension) DASH diet is lower in sodium than a typical American diet. Cut back on foods that are high in saturated fat, cholesterol, and trans fats. Eat more whole-grain foods, fish, poultry, and nuts Remain active and exercise as tolerated daily.  Monitor BP at home-Call if greater than 130/80.  Check CMP/CBC  Gastroesophageal reflux disease, esophagitis presence not specified No suspected reflux complications (Barret/stricture). Lifestyle modification:  wt loss, avoid meals 2-3h before bedtime. Consider eliminating food triggers:  chocolate, caffeine, EtOH, acid/spicy food.  ESOPHAGEAL STRICTURE Doing well at this time Continue to monitor symptoms Follows with Dr Marina Goodell Q6 months   Osteopenia, unspecified location Pursue a combination of weight-bearing exercises and strength training. Patients with severe mobility impairment should be referred for physical therapy. Advised on fall prevention measures including proper lighting in all rooms, removal of area rugs and floor clutter, use of walking devices as deemed appropriate, avoidance of uneven walking surfaces. Smoking cessation and moderate alcohol consumption if applicable Consume 800 to 1000 IU of vitamin D daily with a goal vitamin D serum value of 30 ng/mL or higher. Aim for 1000 to 1200 mg of elemental calcium daily through supplements and/or dietary sources.  Mixed hyperlipidemia Discussed lifestyle modifications. Recommended diet heavy in fruits and veggies, omega 3's. Decrease consumption of animal meats, cheeses, and dairy products. Remain active and exercise as tolerated. Continue to monitor. Check lipids/TSH  Mixed  obsession thoughts and acts Doing well at this time Taking anafranil 50mg  HS Clonazepam 1mg  HS Follows with Dr Jennelle Human  Vitamin D deficiency Continue supplement for goal of 60-100 Monitor Vitamin D levels  Abnormal glucose Education: Reviewed 'ABCs' of diabetes management  Discussed goals to be met and/or maintained include A1C (<7) Blood pressure (<130/80) Cholesterol (LDL <70) Continue Eye Exam yearly  Continue Dental Exam Q6 mo Discussed dietary recommendations Discussed Physical Activity recommendations Check A1C  History of colon polyps Dr. Marina Goodell follows Colonoscopy 2022  BMI 22 Discussed appropriate BMI Diet modification. Physical activity. Encouraged/praised to build confidence.  Med management All medications discussed and reviewed in full. All questions and concerns regarding medications addressed.    Orders Placed This Encounter  Procedures   CBC with Differential/Platelet   COMPLETE METABOLIC PANEL WITH GFR   Lipid panel   Hemoglobin A1c   I discussed the assessment and treatment plan with the patient. The patient was provided an opportunity to ask questions and all were answered. The patient agreed with the plan and demonstrated an understanding of the instructions.   The patient was advised to call back or seek an in-person evaluation if the symptoms worsen or if the condition fails to improve as anticipated.  I provided 30 minutes of non-face-to-face time during this encounter including counseling, chart review, and critical decision making was preformed.   Future Appointments  Date Time Provider Department Center  01/28/2024  3:00 PM [facility] None  02/05/2024  3:15 PM Si Gaul, MD [facility] None  03/01/2024  2:30 PM Lucky Cowboy, MD [facility] None  05/26/2024  4:00 PM Cottle, Steva Ready., MD [facility] None  06/02/2024  3:30 PM Adela Glimpse, NP [facility] None  09/06/2024  2:00 PM Lucky Cowboy, MD [facility] None   11/25/2024 11:00 AM Adela Glimpse, NP [facility]  None      Plan:   During the course of the visit the patient was educated and counseled about appropriate screening and preventive services including:   Pneumococcal vaccine  Influenza vaccine Prevnar 13 Td vaccine Screening electrocardiogram, deferred, telephone visit COVID19. Colorectal cancer screening Diabetes screening Glaucoma screening Nutrition counseling    Subjective:  Donna Dillon is a 77 y.o. female who presents for Medicare Annual Wellness Visit and 3 month follow up for HTN/tachycardia, GERD, hyperlipidemia, abnormal glucose, OCD, histroy of esophageal stricture, osteopenia, and vitamin D Def.   Overall she reports feeling well today.  She has no new concerns at this time.  Does have COPD/emphysematous changes on xray 12/18/2018, again in 12/2020. Not on inhalers, denies sx other than with URI. Reports post nasal drip triggers bronchitis episodes.   She has hx of colon polyps, GERD with stricture (had last dilation 11/11/2019), followed by Dr. Marina Goodell. Last EGD 2020 was benign. She is due for colonoscopy 02/2021  She is followed by psych Dr. Jennelle Human for OCD, on anafranil and PRN klonopin.   BMI is Body mass index is 24.96 kg/m., she has been working on diet and exercise. She does walking 15-20 2-3 days a week.  Wt Readings from Last 3 Encounters:  11/26/23 157 lb (71.2 kg)  08/25/23 154 lb 3.2 oz (69.9 kg)  08/05/23 154 lb 9.6 oz (70.1 kg)   History of hypertension that predates 1990 Her blood pressure has been controlled at home, today their BP is BP: 110/72   She does workout. She denies chest pain, shortness of breath, dizziness.   She is on cholesterol medication and denies myalgias. She is taking zetia, side effects with statins, unable to tolerate. Her cholesterol is not at goal. The cholesterol last visit was:   Lab Results  Component Value Date   CHOL 226 (H) 02/18/2023   HDL 75 02/18/2023   LDLCALC  121 (H) 02/18/2023   TRIG 183 (H) 02/18/2023   CHOLHDL 3.0 02/18/2023   She has been working on diet and exercise for hx of prediabetes (A1c 5.8%, 2011, recently well controlled after weight), and denies hyperglycemia, hypoglycemia , increased appetite, nausea, paresthesia of the feet, polydipsia, polyuria, visual disturbances and vomiting. Last A1C in the office was:  Lab Results  Component Value Date   HGBA1C 5.6 08/25/2023   Last GFR Lab Results  Component Value Date   GFRNONAA >60 07/30/2023    Patient is on Vitamin D supplement.  Deficiency noted (36/2008).  Last Vitamin D level was in normal range but below goal of 70-90. Lab Results  Component Value Date   VD25OH 45 08/25/2023       Medication Review:   Current Outpatient Medications (Cardiovascular):    atenolol (TENORMIN) 25 MG tablet, Take 1 tablet (25 mg total) by mouth daily.   ezetimibe (ZETIA) 10 MG tablet, Take  1 tablet  Daily  for Cholesterol     Current Outpatient Medications (Other):    clomiPRAMINE (ANAFRANIL) 50 MG capsule, Take 1 capsule (50 mg total) by mouth at bedtime.   clonazePAM (KLONOPIN) 1 MG tablet, Take 1.5 tablets (1.5 mg total) by mouth at bedtime.   Propylene Glycol (SYSTANE BALANCE) 0.6 % SOLN, Place 1 drop into both eyes as needed (dry eyes).   QUEtiapine (SEROQUEL) 25 MG tablet, Take 1 tablet (25 mg total) by mouth at bedtime.   gabapentin (NEURONTIN) 300 MG capsule, Take 1 capsule (300 mg total) by mouth at bedtime. (Patient not taking:  Reported on 11/26/2023)  Allergies: Allergies  Allergen Reactions   Prednisone     Altered mental state   Penicillins Hives and Itching   Asa [Aspirin] Diarrhea   Levaquin [Levofloxacin] Other (See Comments)    Joint Pain   Macrolides And Ketolides Nausea And Vomiting   Paxil [Paroxetine Hcl] Nausea And Vomiting   Tetracyclines & Related Nausea And Vomiting   Zoloft [Sertraline Hcl] Nausea And Vomiting   Sulfa Antibiotics Rash   Trimethoprim  Rash    Current Problems (verified) has ESOPHAGEAL STRICTURE; GERD; History of colonic polyps; Osteopenia; Hyperlipidemia; Abnormal glucose; Vitamin D deficiency; BMI 23.0-23.9, adult; FHx: heart disease; OCD (obsessive compulsive disorder); Flying phobia; Essential hypertension; Pulmonary emphysema (HCC) - per CXR 12/2018; Lung nodule; S/P partial lobectomy of lung; and Adenocarcinoma of upper lobe of right lung (HCC) on their problem list.  Screening Tests Immunization History  Administered Date(s) Administered   Influenza, High Dose Seasonal PF 11/14/2014, 10/10/2016, 12/03/2017, 11/03/2018, 08/25/2023   PFIZER(Purple Top)SARS-COV-2 Vaccination 02/24/2020, 03/20/2020   Pneumococcal Conjugate-13 09/21/2015   Pneumococcal Polysaccharide-23 02/14/2009, 05/13/2017   Td 12/11/2006, 12/03/2017   Tdap 12/09/2005   Zoster, Live 02/27/2011    Preventative care: Last colonoscopy: 2022, Dr Marina Goodell. Mammogram: 06/2019 - [facility], Due DEXA: 2014, osteopenia DUE - ordered to schedule Pap: 2016, DONE   Prior vaccinations: TD or Tdap: 2018  Influenza: 08/2023  Pneumococcal: 2018 Prevnar13: 2016 Shingles/Zostavax: 2012 Covid 19; had 3/3, pfizer, 2021- pending record   Names of Other Physician/Practitioners you currently use: 1. [facility] here for primary care 2. Eye Exam, Dr. ?, Dr. Vic Blackbird office, 2024 3.Dental Exam, Dr Mora Appl, 2024  Patient Care Team: Lucky Cowboy, MD as PCP - General (Internal Medicine) Ernesto Rutherford, MD as Consulting Physician (Ophthalmology) Hilarie Fredrickson, MD as Consulting Physician (Gastroenterology) Rachael Fee, MD as Attending Physician (Gastroenterology) Jerene Bears, MD as Consulting Physician (Gynecology)  Surgical: She  has a past surgical history that includes Tonsillectomy; Breast biopsy (Left); Cesarean section; Upper gastrointestinal endoscopy; Polypectomy; eye lift; Colonoscopy (2017); Eye surgery;  Intercostal nerve block (Right, 12/25/2022); Node dissection (Right, 12/25/2022); Bronchial needle aspiration biopsy (12/25/2022); and Fiducial marker placement (12/25/2022). Family Her family history includes Colon cancer in her paternal aunt; Emphysema in her father; Heart disease in her mother; Hypertension in her mother; Kidney cancer in her father. Social history  She reports that she has never smoked. She has been exposed to tobacco smoke. She has never used smokeless tobacco. She reports that she does not drink alcohol and does not use drugs.  MEDICARE WELLNESS OBJECTIVES: Physical activity:   Cardiac risk factors:   Depression/mood screen:      11/26/2023    3:04 PM  Depression screen PHQ 2/9  Decreased Interest 0  Down, Depressed, Hopeless 0  PHQ - 2 Score 0    ADLs:     11/26/2023    3:01 PM 08/25/2023   12:57 AM  In your present state of health, do you have any difficulty performing the following activities:  Hearing? 0 0  Vision? 0 0  Difficulty concentrating or making decisions? 0 0  Walking or climbing stairs? 0 0  Dressing or bathing? 0 0  Doing errands, shopping? 0 0     Cognitive Testing  Alert? Yes  Normal Appearance?Yes  Oriented to person? Yes  Place? Yes   Time? Yes  Recall of three objects?  Yes  Can perform simple calculations? Yes  Displays appropriate judgment?Yes  Can  read the correct time from a watch face?Yes  EOL planning:     Objective:   Today's Vitals   11/26/23 1427  BP: 110/72  Pulse: 69  Temp: 97.8 F (36.6 C)  SpO2: 99%  Weight: 157 lb (71.2 kg)  Height: 5' 6.5" (1.689 m)    Body mass index is 24.96 kg/m.  General Appearance: Well nourished, in no apparent distress. Eyes: conjunctiva no swelling or erythema ENT/Mouth: Mask in place; Hearing normal.  Neck: Supple, thyroid normal.  Respiratory: Respiratory effort normal, BS equal bilaterally without rales, rhonchi, wheezing or stridor.  Cardio: RRR with no MRGs. Brisk  peripheral pulses without edema.  Abdomen: Soft, + BS.  Non tender, no guarding, rebound, hernias, masses. Lymphatics: Non tender without lymphadenopathy.  Musculoskeletal: No obvious deformity, normal gait.  Skin: Warm, dry without rashes, lesions, ecchymosis.  Neuro: Normal muscle tone Psych: Awake and oriented X 3, anxious affect, Insight and Judgment appropriate.    Medicare Attestation I have personally reviewed: The patient's medical and social history Their use of alcohol, tobacco or illicit drugs Their current medications and supplements The patient's functional ability including ADLs,fall risks, home safety risks, cognitive, and hearing and visual impairment Diet and physical activities Evidence for depression or mood disorders  The patient's weight, height, BMI, and visual acuity have been recorded in the chart.  I have made referrals, counseling, and provided education to the patient based on review of the above and I have provided the patient with a written personalized care plan for preventive services.     Adela Glimpse, NP 3:20 PM [facility]

## 2023-11-26 NOTE — Patient Instructions (Signed)

## 2023-11-27 LAB — COMPLETE METABOLIC PANEL WITH GFR
AG Ratio: 2.1 (calc) (ref 1.0–2.5)
ALT: 13 U/L (ref 6–29)
AST: 8 U/L — ABNORMAL LOW (ref 10–35)
Albumin: 4.8 g/dL (ref 3.6–5.1)
Alkaline phosphatase (APISO): 83 U/L (ref 37–153)
BUN: 10 mg/dL (ref 7–25)
CO2: 28 mmol/L (ref 20–32)
Calcium: 9.9 mg/dL (ref 8.6–10.4)
Chloride: 103 mmol/L (ref 98–110)
Creat: 0.81 mg/dL (ref 0.60–1.00)
Globulin: 2.3 g/dL (ref 1.9–3.7)
Glucose, Bld: 87 mg/dL (ref 65–99)
Potassium: 4.5 mmol/L (ref 3.5–5.3)
Sodium: 140 mmol/L (ref 135–146)
Total Bilirubin: 0.5 mg/dL (ref 0.2–1.2)
Total Protein: 7.1 g/dL (ref 6.1–8.1)
eGFR: 75 mL/min/{1.73_m2} (ref >=60)

## 2023-11-27 LAB — HEMOGLOBIN A1C
Hgb A1c MFr Bld: 5.6 %{Hb} (ref <5.7)
Mean Plasma Glucose: 114 mg/dL
eAG (mmol/L): 6.3 mmol/L

## 2023-11-27 LAB — LIPID PANEL
Cholesterol: 221 mg/dL — ABNORMAL HIGH (ref <200)
HDL: 66 mg/dL (ref >=50)
LDL Cholesterol (Calc): 121 mg/dL — ABNORMAL HIGH
Non-HDL Cholesterol (Calc): 155 mg/dL — ABNORMAL HIGH (ref <130)
Total CHOL/HDL Ratio: 3.3 (calc) (ref <5.0)
Triglycerides: 225 mg/dL — ABNORMAL HIGH (ref <150)

## 2023-11-27 LAB — CBC WITH DIFFERENTIAL/PLATELET
Absolute Lymphocytes: 2567 {cells}/uL (ref 850–3900)
Absolute Monocytes: 615 {cells}/uL (ref 200–950)
Basophils Absolute: 41 {cells}/uL (ref 0–200)
Basophils Relative: 0.5 %
Eosinophils Absolute: 221 {cells}/uL (ref 15–500)
Eosinophils Relative: 2.7 %
HCT: 43.2 % (ref 35.0–45.0)
Hemoglobin: 14 g/dL (ref 11.7–15.5)
MCH: 29 pg (ref 27.0–33.0)
MCHC: 32.4 g/dL (ref 32.0–36.0)
MCV: 89.4 fL (ref 80.0–100.0)
MPV: 9.9 fL (ref 7.5–12.5)
Monocytes Relative: 7.5 %
Neutro Abs: 4756 {cells}/uL (ref 1500–7800)
Neutrophils Relative %: 58 %
Platelets: 266 10*3/uL (ref 140–400)
RBC: 4.83 10*6/uL (ref 3.80–5.10)
RDW: 12.6 % (ref 11.0–15.0)
Total Lymphocyte: 31.3 %
WBC: 8.2 10*3/uL (ref 3.8–10.8)

## 2024-01-03 ENCOUNTER — Other Ambulatory Visit: Payer: Self-pay | Admitting: Psychiatry

## 2024-01-28 ENCOUNTER — Inpatient Hospital Stay: Payer: Medicare PPO

## 2024-01-28 ENCOUNTER — Ambulatory Visit (HOSPITAL_COMMUNITY)
Admission: RE | Admit: 2024-01-28 | Discharge: 2024-01-28 | Disposition: A | Discharge status: Home / Self Care | Payer: Medicare PPO | Source: Ambulatory Visit | Attending: Internal Medicine | Admitting: Internal Medicine

## 2024-01-28 DIAGNOSIS — C3490 Malignant neoplasm of unspecified part of unspecified bronchus or lung: Secondary | ICD-10-CM | POA: Diagnosis present

## 2024-01-28 LAB — POCT I-STAT CREATININE: Creatinine, Ser: 0.9 mg/dL (ref 0.44–1.00)

## 2024-01-28 MED ORDER — IOHEXOL 300 MG/ML  SOLN
75.0000 mL | Freq: Once | INTRAMUSCULAR | Status: AC | PRN
  Administered 2024-01-28: 75 mL via INTRAVENOUS

## 2024-01-29 ENCOUNTER — Telehealth: Payer: Self-pay | Admitting: Internal Medicine

## 2024-01-30 ENCOUNTER — Other Ambulatory Visit: Payer: Self-pay | Admitting: Medical Oncology

## 2024-01-30 ENCOUNTER — Inpatient Hospital Stay: Payer: Medicare PPO | Attending: Internal Medicine

## 2024-01-30 DIAGNOSIS — K449 Diaphragmatic hernia without obstruction or gangrene: Secondary | ICD-10-CM | POA: Diagnosis not present

## 2024-01-30 DIAGNOSIS — Z08 Encounter for follow-up examination after completed treatment for malignant neoplasm: Secondary | ICD-10-CM | POA: Insufficient documentation

## 2024-01-30 DIAGNOSIS — Z85118 Personal history of other malignant neoplasm of bronchus and lung: Secondary | ICD-10-CM | POA: Diagnosis present

## 2024-01-30 DIAGNOSIS — C3490 Malignant neoplasm of unspecified part of unspecified bronchus or lung: Secondary | ICD-10-CM

## 2024-01-30 LAB — CBC WITH DIFFERENTIAL (CANCER CENTER ONLY)
Abs Immature Granulocytes: 0.03 10*3/uL (ref 0.00–0.07)
Basophils Absolute: 0 10*3/uL (ref 0.0–0.1)
Basophils Relative: 1 %
Eosinophils Absolute: 0.3 10*3/uL (ref 0.0–0.5)
Eosinophils Relative: 4 %
HCT: 42.3 % (ref 36.0–46.0)
Hemoglobin: 13.5 g/dL (ref 12.0–15.0)
Immature Granulocytes: 1 %
Lymphocytes Relative: 36 %
Lymphs Abs: 2.2 10*3/uL (ref 0.7–4.0)
MCH: 29 pg (ref 26.0–34.0)
MCHC: 31.9 g/dL (ref 30.0–36.0)
MCV: 91 fL (ref 80.0–100.0)
Monocytes Absolute: 0.6 10*3/uL (ref 0.1–1.0)
Monocytes Relative: 10 %
Neutro Abs: 2.9 10*3/uL (ref 1.7–7.7)
Neutrophils Relative %: 48 %
Platelet Count: 237 10*3/uL (ref 150–400)
RBC: 4.65 MIL/uL (ref 3.87–5.11)
RDW: 13.2 % (ref 11.5–15.5)
WBC Count: 6.1 10*3/uL (ref 4.0–10.5)
nRBC: 0 % (ref 0.0–0.2)

## 2024-02-05 ENCOUNTER — Other Ambulatory Visit: Payer: Medicare PPO

## 2024-02-05 ENCOUNTER — Inpatient Hospital Stay (HOSPITAL_BASED_OUTPATIENT_CLINIC_OR_DEPARTMENT_OTHER): Payer: Medicare PPO | Admitting: Internal Medicine

## 2024-02-05 VITALS — BP 106/64 | HR 78 | Temp 97.4°F | Resp 15 | Ht 66.5 in | Wt 159.0 lb

## 2024-02-05 DIAGNOSIS — Z08 Encounter for follow-up examination after completed treatment for malignant neoplasm: Secondary | ICD-10-CM | POA: Diagnosis not present

## 2024-02-05 DIAGNOSIS — C3490 Malignant neoplasm of unspecified part of unspecified bronchus or lung: Secondary | ICD-10-CM

## 2024-02-05 NOTE — Progress Notes (Unsigned)
 [facility] [phone]   [phone]  OFFICE PROGRESS NOTE  Pcp, No No address on file  DIAGNOSIS: stage Ia (T1b, N0, M0) non-small cell lung cancer, adenocarcinoma presented with right upper lobe groundglass opacity  PRIOR THERAPY:  Status post right upper lobe wedge resection with lymph node dissection under the care of Dr. Dorris Fetch on December 25, 2022.   CURRENT THERAPY: Observation  INTERVAL HISTORY: Donna Dillon 78 y.o. female returns to the clinic today for 95-month follow-up visit.   MEDICAL HISTORY: Past Medical History:  Diagnosis Date   Anemia    mild   Anxiety    Colon polyp    hyperplastic   Diverticulosis    Esophageal stricture    GERD (gastroesophageal reflux disease)    no s/s now- off medicines   Hyperlipidemia    Palpitations    Pneumonia    PONV (postoperative nausea and vomiting)    Prediabetes    Tachycardia     ALLERGIES:  is allergic to prednisone, penicillins, asa [aspirin], levaquin [levofloxacin], macrolides and ketolides, paxil [paroxetine hcl], tetracyclines & related, zoloft [sertraline hcl], sulfa antibiotics, and trimethoprim.  MEDICATIONS:  Current Outpatient Medications  Medication Sig Dispense Refill   atenolol (TENORMIN) 25 MG tablet TAKE 1 TABLET EVERY DAY 90 tablet 1   clomiPRAMINE (ANAFRANIL) 50 MG capsule Take 1 capsule (50 mg total) by mouth at bedtime. 90 capsule 2   clonazePAM (KLONOPIN) 1 MG tablet Take 1.5 tablets (1.5 mg total) by mouth at bedtime. 45 tablet 5   ezetimibe (ZETIA) 10 MG tablet Take  1 tablet  Daily  for Cholesterol 90 tablet 3   gabapentin (NEURONTIN) 300 MG capsule Take 1 capsule (300 mg total) by mouth at bedtime. (Patient not taking: Reported on 11/26/2023) 30 capsule 1   Propylene Glycol (SYSTANE BALANCE) 0.6 % SOLN Place 1 drop into both eyes as needed (dry eyes).     QUEtiapine (SEROQUEL) 25 MG tablet Take 1 tablet (25 mg total) by mouth at bedtime. 90  tablet 1   No current facility-administered medications for this visit.    SURGICAL HISTORY:  Past Surgical History:  Procedure Laterality Date   BREAST BIOPSY Left    BRONCHIAL NEEDLE ASPIRATION BIOPSY  12/25/2022   Procedure: BRONCHIAL NEEDLE ASPIRATION BIOPSIES;  Surgeon: [name], MD;  Location: [facility] ENDOSCOPY;  Service: Pulmonary;;   CESAREAN SECTION     x1   COLONOSCOPY  2017   eye lift     EYE SURGERY     lasix   FIDUCIAL MARKER PLACEMENT  12/25/2022   Procedure: FIDUCIAL DYE MARKING;  Surgeon: [name], MD;  Location: [facility] ENDOSCOPY;  Service: Pulmonary;;   INTERCOSTAL NERVE BLOCK Right 12/25/2022   Procedure: INTERCOSTAL NERVE BLOCK;  Surgeon: Loreli Slot, MD;  Location: [facility] OR;  Service: Thoracic;  Laterality: Right;   NODE DISSECTION Right 12/25/2022   Procedure: NODE DISSECTION;  Surgeon: Loreli Slot, MD;  Location: [facility] OR;  Service: Thoracic;  Laterality: Right;   POLYPECTOMY     TONSILLECTOMY     UPPER GASTROINTESTINAL ENDOSCOPY      REVIEW OF SYSTEMS:  A comprehensive review of systems was negative.   PHYSICAL EXAMINATION: General appearance: alert, cooperative, and no distress Head: Normocephalic, without obvious abnormality, atraumatic Neck: no adenopathy, no JVD, supple, symmetrical, trachea midline, and thyroid not enlarged, symmetric, no tenderness/mass/nodules Lymph nodes: Cervical, supraclavicular, and axillary nodes normal. Resp: clear to auscultation bilaterally Back:  symmetric, no curvature. ROM normal. No CVA tenderness. Cardio: regular rate and rhythm, S1, S2 normal, no murmur, click, rub or gallop GI: soft, non-tender; bowel sounds normal; no masses,  no organomegaly Extremities: extremities normal, atraumatic, no cyanosis or edema  ECOG PERFORMANCE STATUS: 1 - Symptomatic but completely ambulatory  Last menstrual period 12/09/1994.  LABORATORY DATA: Lab Results  Component Value Date   WBC 6.1 01/30/2024   HGB  13.5 01/30/2024   HCT 42.3 01/30/2024   MCV 91.0 01/30/2024   PLT 237 01/30/2024      Chemistry      Component Value Date/Time   NA 140 11/26/2023 1520   K 4.5 11/26/2023 1520   CL 103 11/26/2023 1520   CO2 28 11/26/2023 1520   BUN 10 11/26/2023 1520   CREATININE 0.90 01/28/2024 1459   CREATININE 0.81 11/26/2023 1520      Component Value Date/Time   CALCIUM 9.9 11/26/2023 1520   ALKPHOS 72 07/30/2023 1454   AST 8 (L) 11/26/2023 1520   AST 14 (L) 07/30/2023 1454   ALT 13 11/26/2023 1520   ALT 21 07/30/2023 1454   BILITOT 0.5 11/26/2023 1520   BILITOT 0.6 07/30/2023 1454       RADIOGRAPHIC STUDIES: CT Chest W Contrast Result Date: 02/05/2024 CLINICAL DATA:  Non-small cell lung cancer restaging * Tracking Code: BO * EXAM: CT CHEST WITH CONTRAST TECHNIQUE: Multidetector CT imaging of the chest was performed during intravenous contrast administration. RADIATION DOSE REDUCTION: This exam was performed according to the departmental dose-optimization program which includes automated exposure control, adjustment of the mA and/or kV according to patient size and/or use of iterative reconstruction technique. CONTRAST:  75mL OMNIPAQUE IOHEXOL 300 MG/ML  SOLN COMPARISON:  07/30/2023 FINDINGS: Cardiovascular: Aortic atherosclerosis. Normal heart size. No pericardial effusion. Mediastinum/Nodes: No enlarged mediastinal, hilar, or axillary lymph nodes. Moderate hiatal hernia with intrathoracic position of the gastric fundus. Thyroid gland, trachea, and esophagus demonstrate no significant findings. Lungs/Pleura: Unchanged postoperative appearance of the right upper lobe status post apical wedge resection (series 8, image 71). No evidence of local recurrence. Unchanged, bandlike scarring and volume loss of the posterior lingula. No pleural effusion or pneumothorax. Upper Abdomen: No acute abnormality.  Hepatic steatosis. Musculoskeletal: No chest wall abnormality. No acute osseous findings.  IMPRESSION: 1. Unchanged postoperative appearance of the right upper lobe status post apical wedge resection. No evidence of local recurrence or metastatic disease in the chest. 2. Moderate hiatal hernia with intrathoracic position of the gastric fundus. 3. Hepatic steatosis. Aortic Atherosclerosis (ICD10-I70.0). Electronically Signed   By: Jearld Lesch M.D.   On: 02/05/2024 10:48    ASSESSMENT AND PLAN: This is a very pleasant 78 years old white female with a stage Ia (T1b, N0, M0) non-small cell lung cancer, adenocarcinoma presented with right upper lobe groundglass opacity status post right upper lobe wedge resection with lymph node dissection under the care of Dr. Dorris Fetch on December 25, 2022.  She is currently on observation and feeling fine.   The patient voices understanding of current disease status and treatment options and is in agreement with the current care plan.  All questions were answered. The patient knows to call the clinic with any problems, questions or concerns. We can certainly see the patient much sooner if necessary.  The total time spent in the appointment was 20 minutes.  Disclaimer: This note was dictated with voice recognition software. Similar sounding words can inadvertently be transcribed and may not be corrected upon review.

## 2024-03-01 ENCOUNTER — Ambulatory Visit: Payer: Medicare PPO | Admitting: Internal Medicine

## 2024-05-26 ENCOUNTER — Encounter: Payer: Self-pay | Admitting: Psychiatry

## 2024-05-26 ENCOUNTER — Ambulatory Visit (INDEPENDENT_AMBULATORY_CARE_PROVIDER_SITE_OTHER): Payer: Medicare PPO | Admitting: Psychiatry

## 2024-05-26 DIAGNOSIS — F5105 Insomnia due to other mental disorder: Secondary | ICD-10-CM

## 2024-05-26 DIAGNOSIS — F411 Generalized anxiety disorder: Secondary | ICD-10-CM

## 2024-05-26 DIAGNOSIS — E782 Mixed hyperlipidemia: Secondary | ICD-10-CM | POA: Diagnosis not present

## 2024-05-26 DIAGNOSIS — F422 Mixed obsessional thoughts and acts: Secondary | ICD-10-CM

## 2024-05-26 MED ORDER — CLOMIPRAMINE HCL 50 MG PO CAPS: 50.0000 mg | ORAL_CAPSULE | Freq: Every day | ORAL | 2 refills | Status: DC

## 2024-05-26 MED ORDER — QUETIAPINE FUMARATE 25 MG PO TABS: 25.0000 mg | ORAL_TABLET | Freq: Every day | ORAL | 3 refills | Status: AC

## 2024-05-26 MED ORDER — EZETIMIBE 10 MG PO TABS
ORAL_TABLET | ORAL | 1 refills | Status: AC
Start: 2024-05-26 — End: ?

## 2024-05-26 MED ORDER — ATENOLOL 25 MG PO TABS: 25.0000 mg | ORAL_TABLET | Freq: Every day | ORAL | 1 refills | Status: AC

## 2024-05-26 MED ORDER — CLONAZEPAM 1 MG PO TABS: 1.5000 mg | ORAL_TABLET | Freq: Every day | ORAL | 5 refills | Status: DC

## 2024-05-26 NOTE — Progress Notes (Signed)
 Donna Dillon [id number] [birth year] 78 y.o.  Subjective:   Patient ID:  Donna Dillon is a 78 y.o. (DOB 05-Mar-1946) female.  Chief Complaint:  Chief Complaint  Patient presents with   Follow-up   Depression   Anxiety    Donna Dillon presents to the office today for follow-up of OCD.    seen December 2020.  No meds were changed.  seen Apr 10, 2020.  The following is noted: She is markedly worse. falling apart.  More fatigued since dilation of esophagus.  January more dry eyes.  Hair falling out.  TSH is normal.   More sleep problems since Feb. Called and increased clonazepam  to 2 mg HS bc also the insomnia was causing more anxiety.  Now also scared of taking the clonazepam .   Added trazodone  100 mg and it didn't help sleep. More obsessed with heart rate being elevated to 100 bpm.   Can't work DT anxiety and insomnia and fear over her heart. Scared.  She was ruminating. Plan:Increase atenolol  for anxiety and tachycardia to 25 mg daily. DC trazodone  Seroquel  25-50 mg for sleep Needs increased BZ .  She is afraid increasing the clonazepam  but may be willing to take Xanax during the day.  Stay out of work this week. Disc option of Day treatment bc severity of sx and she's afraid of being alone. Option call Behavioral Health Assessment at the main [facility] phone.  Tell them I referred you to the Day treatment program. Return to office in 4 days.  04/14/2020 appointment the following is noted: Took 37.5 mg Seroquel  Monday with 2 mg Klonopin  and awoke in 3 hour. Afterwards usually took Seroquel  50 mg since then with Klonopin .  Lost another 1# and total of 11#/3 weeks.  Eating after taking alprazolam.  Can't quilt or read or focus on TV.  Heart rate is better after increase atenolol  to 25 mg daily. Now 5-6 hours of sleep. NOW REALIZES SHE STOPPED CLOMIPRAMINE  SOME MONTHS AGO ACCIDENTALLY. Plan: Start clomipramine  25 mg 1 capsule for 10 days, then 2 each night or 50 mg  nightly. Continue Klonopin  2 mg HS Increase Seroquel  75 mg for sleep and anxiety and rumination Needs increased BZ .  She is afraid increasing the clonazepam  but may be willing to take Xanax during the day.  Ok continue Xanax daytime for anxiety and allow her to eat..  Stay out of work right now.  Is on medical leave. Disc option of Day treatment bc severity of sx and she's afraid of being alone. Option call Behavioral Health Assessment at the main [facility] phone.  Tell them I referred you to the Day treatment program.  04/21/2020 urgent appointment, the following is noted: Generally sleeping better with quetiapine  75 mg HS.  Getting hangover.  Has to take Xanax to eat.  Losing weight 10#.  Just wants to lay down.  Feels shakey and weak. Did get restarted on clomipramine  25 mg HS.  Fatigue and hard to concentrate.   Plan: Continue medical leave until May 29, 2020.   The following med decisions were made: Continue clomipramine  25 mg 1 capsule for 10 days, then 2 each night or 50 mg nightly. Continue Klonopin  2 mg HS Increase Seroquel  75 mg for sleep and anxiety and rumination   05/03/2020 appointment urgently with the following noted:  Doesn't feel better yet.  Seroquel  75 gave her hangover.  With 50 mg usually 8 hours but some EMA esp if anxiety is bad.  Lays in  bed until noon bc doesn't want to do anything  In bed to escape. When first started clomipramine  25 had sig insomnia.  At 50 mg now Appetite still poor and lost 10#.  Forcing food for 6 weeks. Idleness is not good for worry but can't focus well enough to read as much as she would usually otherwise bc anxiety interferes. Taking alprazolam 0.5 mg in pm to help eat dinner.  Getting from [place]. No med changes  05/18/20 appt with the following noted:  Another urgent appt DT severity of anxiety. On clomipramine  50 mg since about 5/14. Appetite not better yet.  Lost total 12-13#.   Not depressed. Feels sleepy.  Always tired.  Wonders if  there's medical cause. Sleeping a lot with Seroquel  50 mg HS. Notices less anxiety by a little.   A little more obsessions since being at home but not severe. Not currently physically able to do the job and embarrassed about being out of work.  Disc concerns about her age and work and wondering about retirement from it fully.  No med changes.  06/07/2020 appointment with the following noted: Sleeping well with clonazepam  2 mg HS and quetiapine  50 mg HS. Still on clomipramine  50 since 04/21/20. Still exhausted from going to grocery store.  Not mentally exhausted but is physically.   Has remained on medical leave but not covered by FMLA bc not working FT. Decided to resign.  Doesn't feel able to do it yet.  Hopes to get to volunteer in the fall. Naps a good deal. Plan: Continue clomipramine  50 mg nightly (started about 5/14) Continue Klonopin  2 mg HS Continue Seroquel  25-50 mg for sleep and anxiety and rumination .  Try reducing as soon as she can to help energy. Ok continue Xanax daytime for anxiety and allow her to eat..  Meds won't work if you don't get enough protein.  08/10/20 appt with the following noted:  Nothing to do but gave job up bc didn't think she could do it physically. Did clean her house well.   Clomipramine  has kicked in but hard time bc nothing to divert her attention.   No anxiety until married. After 5-6 years later got osbessions.  Never compulsions. Not depressed or despairing anymore. Thinking of 12 hour a week job and needs it to get out of the house.  Needs stimulation and activity. Plan: no med changes.  11/13/2020 appointment with the following noted: Stopped quetiapine  a week ago.  Sleep is OK with a little more awakening but not severe.   Some chronic OCD but minimal some chronic anxiety but manageable.  Not depressed.  Sleep is adequate Son Charon Copper doing well with promotion at [employer] managing 20 people. Surprisingly well. Still has compulsions including  night eating.  Married since 2016.  Hasn't travelled lately but wants to.  [place]. [place].  Has a dog. Plan no med changes:   03/15/2021 appointment with the following noted: A lot of changes in the last year.  Health decline in physical ability to walk.  No particular reason. Lost job and friends at the job.  Lost dog of 12 years.  B died in Jan 22, 2024, lived in [place].   Was going to volunteer but didn't DT covid. Dx advancing COPD despite not smoking.  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 Clonazepam  helps her and needs the 2 mg dose.  Afraid to reduce it.  Insurance won't cover. Needed to increase clonazepam  to 2 mg HS 1  year ago and sleeping fine. No seroquel  needed. Not depressed. Son eccentric but genius. Continue clomipramine  50 mg nightly (started about 04/2013) Continue Klonopin  2 mg HS for now but hope to gradually reduce it with goal of about 1 mg eventually.  Unlikely to be able to stop it. Disc way to reduce it in detail. No  med changes today.   09/13/2021 appointment with the following noted: Still can't find things to do or places to volunteer.  Need something to do.   Son Charon Copper works for [employer] and works from home. OCD manageable but not 100% gone. Disc concerns about heart rate.  It's less than 100. Not exercising, but used to. EKG yearly but still worries her. Patient reports stable mood and denies depressed or irritable moods.  Patient denies any recent difficulty with anxiety.  Patient denies difficulty with sleep initiation or maintenance. Denies appetite disturbance.  Patient reports that energy and motivation have been good.  Patient denies any difficulty with concentration.  Patient denies any suicidal ideation. Plan no med changes  05/14/2022 appointment with the following noted: No med changes. Chronic tiredness to some degree.  Sleep good.   Gets SOB with exertion.  More physical problems.  Dental problems, gingivitis.  Some leg  swelling. Sleep well.  Not very active.  Pending card referral.  Not sleepy in the AM Son getting divorced but he's not good socially.  Pt making him dinner.  11/13/22 appt noted: Some med issues with clonazepam  RF.  Was still getting 2 mg size. Taking clonazepam  1.5 mg HS and clomipramine  50 mg HS and a little quetiapine .   Quetiapine  helps sleep at 12.5 mg HS. Has had some health problems and including pulm nodule and needs Bx.  Pending and likely lobectomy.  Disc stress of it and doesn't feel she can reduce the clonazepam . Feels SOB and tired.  Coughs in the AM when gets up. Not sig depressed.    05/15/23 appt noted: Taking clonazepam  1.5 mg HS and clomipramine  50 mg HS and a little quetiapine .   Quetiapine  helps sleep at 12.5 mg HS. Quetiapine  helps sleep a lot.  Doesn't sleep well enough with clonazepam  alone.   Had a rough time bc Had lung 9 mm adenocarcinoma resected in January.   Couldn't sleep while in the hospital.  Get crazy if can't sleep.  Was very stressful.  Doesn't feels she has fully recovered.  Hs some numbness from the nerve block.   Has not had energy to do things but so bored and needs more activity.   Some fears of recurrence.   Tiredness gradually better but still a problems. SOB with exertion and coughing.  Worries over her heart and lungs.     11/25/23 appt noted: Taking clonazepam  1.25 mg HS and clomipramine  50 mg HS and a little quetiapine .   Quetiapine  helps sleep at 12.5 mg HS. Multiple stressors health wise, hair loss, skin dry. B died and beloved Whippet died.  Water damage at home.   Had lung carcinoadenoma of lung and had surgery.   Wakes at night to urinate but seems to need an extraordinary amount of sleep. So bored all the time and can't find anything to do that she can hold up to do. Can't get up in time to volunteer at school.  Feels better at night and so wants to stay up at night and then sleep late. [place] won't travel.  Can't fly to [place]  physically.   Frustrated she doesn't have grand  kids.   Did ok with reduction in clonazepam .  Would like to try to go further Plan: Continue clomipramine  50 mg nightly (started about 04/2013) Rec trial of reducing Klonopin  to 1.5 mg HS bc of age increase risk of SE. Unlikely to be able to stop it. But trial reduce to 1.25 mg recommended. Disc slow taper process.   Continue Seroquel  25 mg HS for TR insomnia. Asks I renew atenolol  25 mg bc lost her bottle  05/26/24 appt noted:  medL  : clonazepam  1.0 mg HS, clomipramine  50 mg HS , Quetiapine  helps sleep at 25 mg HS. Pet Whippit died a couple of years ago.  May get another .   She want sto wait until more volunteer work before reducing it again.  Night person and hard to go to bed early enough to volunteer.   FU lung CA scan in August.   Done ok with less clonazepam  for sleep.   PCP died and hard getting another PCP. Atenolol  helps anxiety a little.  Not depressed.  But bored.  Trying to stay busy.   Son Charon Copper 59 yo and probably won't get kids.  Though he's living with a woman.  He doesn't want kids.  Mornings are worse with OCD and evenings are better.  Sometimes can tlak herself out of it.  Uses catch phrases to fight OCD when it first pops up.   8 hour sleep.     Past Psychiatric Medication Trials:  Clomipramine ,   Poor response SSRIs  Trazodone  100 NR Seroquel  75 hangover Clonazepam  1-2 mg HS  Review of Systems:  Review of Systems  Constitutional:  Positive for fatigue. Negative for appetite change and unexpected weight change.       Lost 10-12 # in 3 weeks  HENT:  Positive for dental problem.   Respiratory:  Positive for shortness of breath.   Cardiovascular:  Positive for leg swelling.  Gastrointestinal:  Negative for abdominal pain.  Neurological:  Negative for dizziness and tremors.  Psychiatric/Behavioral:  Negative for agitation, behavioral problems, confusion, decreased concentration, dysphoric mood, hallucinations,  self-injury, sleep disturbance and suicidal ideas. The patient is nervous/anxious. The patient is not hyperactive.     Medications: I have reviewed the patient's current medications.  Current Outpatient Medications  Medication Sig Dispense Refill   Propylene Glycol (SYSTANE BALANCE) 0.6 % SOLN Place 1 drop into both eyes as needed (dry eyes).     atenolol  (TENORMIN ) 25 MG tablet Take 1 tablet (25 mg total) by mouth daily. 90 tablet 1   clomiPRAMINE  (ANAFRANIL ) 50 MG capsule Take 1 capsule (50 mg total) by mouth at bedtime. 90 capsule 2   clonazePAM  (KLONOPIN ) 1 MG tablet Take 1.5 tablets (1.5 mg total) by mouth at bedtime. 45 tablet 5   ezetimibe  (ZETIA ) 10 MG tablet Take  1 tablet  Daily  for Cholesterol 90 tablet 1   QUEtiapine  (SEROQUEL ) 25 MG tablet Take 1 tablet (25 mg total) by mouth at bedtime. 90 tablet 3   No current facility-administered medications for this visit.    Medication Side Effects: None, dry  Allergies:  Allergies  Allergen Reactions   Prednisone     Altered mental state   Penicillins Hives and Itching   Asa [Aspirin] Diarrhea   Levaquin  [Levofloxacin ] Other (See Comments)    Joint Pain   Macrolides And Ketolides Nausea And Vomiting   Paxil [Paroxetine Hcl] Nausea And Vomiting   Tetracyclines & Related Nausea And Vomiting   Zoloft [Sertraline Hcl] Nausea  And Vomiting   Sulfa Antibiotics Rash   Trimethoprim Rash    Past Medical History:  Diagnosis Date   Anemia    mild   Anxiety    Colon polyp    hyperplastic   Diverticulosis    Esophageal stricture    GERD (gastroesophageal reflux disease)    no s/s now- off medicines   Hyperlipidemia    Palpitations    Pneumonia    PONV (postoperative nausea and vomiting)    Prediabetes    Tachycardia     Family History  Problem Relation Age of Onset   Hypertension Mother    Heart disease Mother    Kidney cancer Father    Emphysema Father        smoked   Colon cancer Paternal Aunt    Colon polyps  Neg Hx    Esophageal cancer Neg Hx    Rectal cancer Neg Hx    Stomach cancer Neg Hx     Social History   Socioeconomic History   Marital status: Married    Spouse name: Not on file   Number of children: 1   Years of education: Not on file   Highest education level: Not on file  Occupational History   Occupation: Librarian  Tobacco Use   Smoking status: Never    Passive exposure: Past   Smokeless tobacco: Never  Vaping Use   Vaping status: Never Used  Substance and Sexual Activity   Alcohol  use: No    Alcohol /week: 0.0 standard drinks of alcohol    Drug use: No   Sexual activity: Not on file  Other Topics Concern   Not on file  Social History Narrative   Not on file   Social Drivers of Health   Financial Resource Strain: Not on file  Food Insecurity: Not on file  Transportation Needs: Not on file  Physical Activity: Not on file  Stress: Not on file  Social Connections: Not on file  Intimate Partner Violence: Not on file    Past Medical History, Surgical history, Social history, and Family history were reviewed and updated as appropriate.   Son has severe anxiety and is treated also.  Please see review of systems for further details on the patient's review from today.   Objective:   Physical Exam:  LMP 12/09/1994   Physical Exam Constitutional:      General: She is not in acute distress.    Appearance: She is well-developed.   Musculoskeletal:        General: No deformity.   Neurological:     Mental Status: She is alert and oriented to person, place, and time.     Motor: No tremor.     Coordination: Coordination normal.     Gait: Gait normal.   Psychiatric:        Attention and Perception: She is attentive. She does not perceive auditory hallucinations.        Mood and Affect: Mood is anxious. Mood is not depressed. Affect is not labile, blunt, angry, tearful or inappropriate.        Speech: Speech normal. Speech is not slurred.        Behavior:  Behavior normal.        Thought Content: Thought content normal. Thought content is not delusional. Thought content does not include homicidal or suicidal ideation. Thought content does not include suicidal plan.        Cognition and Memory: Cognition normal.  Judgment: Judgment normal.     Comments: Insight intact. Residual obsessions if not busy.. No auditory or visual hallucinations. No delusions.  No rumination  Neat .  No sedation.  Pleasant and talkative, fluent      Lab Review:     Component Value Date/Time   NA 140 11/26/2023 1520   K 4.5 11/26/2023 1520   CL 103 11/26/2023 1520   CO2 28 11/26/2023 1520   GLUCOSE 87 11/26/2023 1520   BUN 10 11/26/2023 1520   CREATININE 0.90 01/28/2024 1459   CREATININE 0.81 11/26/2023 1520   CALCIUM  9.9 11/26/2023 1520   PROT 7.1 11/26/2023 1520   ALBUMIN 4.0 07/30/2023 1454   AST 8 (L) 11/26/2023 1520   AST 14 (L) 07/30/2023 1454   ALT 13 11/26/2023 1520   ALT 21 07/30/2023 1454   ALKPHOS 72 07/30/2023 1454   BILITOT 0.5 11/26/2023 1520   BILITOT 0.6 07/30/2023 1454   GFRNONAA >60 07/30/2023 1454   GFRNONAA 77 02/15/2021 1546   GFRAA 89 02/15/2021 1546       Component Value Date/Time   WBC 6.1 01/30/2024 1556   WBC 8.2 11/26/2023 1520   RBC 4.65 01/30/2024 1556   HGB 13.5 01/30/2024 1556   HCT 42.3 01/30/2024 1556   PLT 237 01/30/2024 1556   MCV 91.0 01/30/2024 1556   MCH 29.0 01/30/2024 1556   MCHC 31.9 01/30/2024 1556   RDW 13.2 01/30/2024 1556   LYMPHSABS 2.2 01/30/2024 1556   MONOABS 0.6 01/30/2024 1556   EOSABS 0.3 01/30/2024 1556   BASOSABS 0.0 01/30/2024 1556    11/2022 EKG was normal.  .res Assessment: Plan:    Mixed obsessional thoughts and acts - Plan: clonazePAM  (KLONOPIN ) 1 MG tablet, clomiPRAMINE  (ANAFRANIL ) 50 MG capsule  Generalized anxiety disorder - Plan: QUEtiapine  (SEROQUEL ) 25 MG tablet, clonazePAM  (KLONOPIN ) 1 MG tablet, clomiPRAMINE  (ANAFRANIL ) 50 MG capsule  Insomnia due to mental  condition - Plan: QUEtiapine  (SEROQUEL ) 25 MG tablet, clonazePAM  (KLONOPIN ) 1 MG tablet  Hyperlipidemia, mixed - Plan: ezetimibe  (ZETIA ) 10 MG tablet    Marked  worsening of anxiety and depresssion without obvious worsening of OCD after STOPPING CLOMIPRAMINE  ACCIDENTALLY  in 2020.  30 min session.    But cannot sleep without clonazepam  .   Disc risks TCA including cardiac and risk increasing resting heart rate.  Last EKG ok. Will renew her atenolol  per PCP for this.  We discussed the short-term risks associated with benzodiazepines including sedation and increased fall risk among others.  Discussed long-term side effect risk including dependence, potential withdrawal symptoms, and the potential eventual dose-related risk of dementia.  But recent studies from 2020 dispute this association between benzodiazepines and dementia risk. Newer studies in 2020 do not support an association with dementia. Disc her fears of addiction.  Wants to eventually consider tapering off of it using the quetiapine  to protect her sleep.  Disc her fears of dose reduction but it is time before she has SE related to age and the BZ.  Extensive discussion.  Counseling 23 min: Supportive therapy and CBT on relapse prevention and what to do to deal with multiple stressors and cannot work to distract herself.  Needs activity to manage her anxiety.  Problem solved and disc volunteer options to help her feel more useful.   She prefers to stay up late and sleep late.    Continue clomipramine  50 mg nightly (started about 04/2013)  When possible try again to reduce Klonopin  to 0.75 mg HS bc of  age increase risk of SE. Unlikely to be able to stop it. Successfully reduced to 1.0 mg.  Disc slow taper process.    Continue Seroquel  25 mg HS for TR insomnia.  Asks I renew atenolol  25 mg and Zetia  BC PCP died.  OK but needs to get new PCP.  FU 6 mos   Nori Beat, MD, DFAPA   Please see After Visit Summary for patient  specific instructions.  Future Appointments  Date Time Provider Department Center  07/27/2024  2:45 PM [facility] None  08/03/2024  3:30 PM Marlene Simas, MD [facility] None    No orders of the defined types were placed in this encounter.     -------------------------------

## 2024-06-02 ENCOUNTER — Ambulatory Visit: Payer: Medicare PPO | Admitting: Nurse Practitioner

## 2024-07-27 ENCOUNTER — Ambulatory Visit (HOSPITAL_COMMUNITY)
Admission: RE | Admit: 2024-07-27 | Discharge: 2024-07-27 | Disposition: A | Discharge status: Home / Self Care | Source: Ambulatory Visit | Attending: Internal Medicine | Admitting: Internal Medicine

## 2024-07-27 ENCOUNTER — Inpatient Hospital Stay: Payer: Medicare PPO | Attending: Internal Medicine

## 2024-07-27 DIAGNOSIS — C3490 Malignant neoplasm of unspecified part of unspecified bronchus or lung: Secondary | ICD-10-CM | POA: Diagnosis present

## 2024-07-27 DIAGNOSIS — Z85118 Personal history of other malignant neoplasm of bronchus and lung: Secondary | ICD-10-CM | POA: Insufficient documentation

## 2024-07-27 DIAGNOSIS — R1110 Vomiting, unspecified: Secondary | ICD-10-CM | POA: Insufficient documentation

## 2024-07-27 DIAGNOSIS — K5649 Other impaction of intestine: Secondary | ICD-10-CM | POA: Diagnosis not present

## 2024-07-27 DIAGNOSIS — K449 Diaphragmatic hernia without obstruction or gangrene: Secondary | ICD-10-CM | POA: Insufficient documentation

## 2024-07-27 LAB — CBC WITH DIFFERENTIAL (CANCER CENTER ONLY)
Abs Immature Granulocytes: 0.01 K/uL (ref 0.00–0.07)
Basophils Absolute: 0.1 K/uL (ref 0.0–0.1)
Basophils Relative: 1 %
Eosinophils Absolute: 0.2 K/uL (ref 0.0–0.5)
Eosinophils Relative: 3 %
HCT: 41.3 % (ref 36.0–46.0)
Hemoglobin: 13.5 g/dL (ref 12.0–15.0)
Immature Granulocytes: 0 %
Lymphocytes Relative: 32 %
Lymphs Abs: 2.2 K/uL (ref 0.7–4.0)
MCH: 28.8 pg (ref 26.0–34.0)
MCHC: 32.7 g/dL (ref 30.0–36.0)
MCV: 88.2 fL (ref 80.0–100.0)
Monocytes Absolute: 0.6 K/uL (ref 0.1–1.0)
Monocytes Relative: 9 %
Neutro Abs: 3.8 K/uL (ref 1.7–7.7)
Neutrophils Relative %: 55 %
Platelet Count: 241 K/uL (ref 150–400)
RBC: 4.68 MIL/uL (ref 3.87–5.11)
RDW: 13.3 % (ref 11.5–15.5)
WBC Count: 6.9 K/uL (ref 4.0–10.5)
nRBC: 0 % (ref 0.0–0.2)

## 2024-07-27 LAB — CMP (CANCER CENTER ONLY)
ALT: 17 U/L (ref 0–44)
AST: 10 U/L — ABNORMAL LOW (ref 15–41)
Albumin: 4.3 g/dL (ref 3.5–5.0)
Alkaline Phosphatase: 84 U/L (ref 38–126)
Anion gap: 7 (ref 5–15)
BUN: 10 mg/dL (ref 8–23)
CO2: 28 mmol/L (ref 22–32)
Calcium: 9.4 mg/dL (ref 8.9–10.3)
Chloride: 104 mmol/L (ref 98–111)
Creatinine: 0.83 mg/dL (ref 0.44–1.00)
GFR, Estimated: >60 mL/min (ref >60)
Glucose, Bld: 92 mg/dL (ref 70–99)
Potassium: 3.9 mmol/L (ref 3.5–5.1)
Sodium: 139 mmol/L (ref 135–145)
Total Bilirubin: 0.5 mg/dL (ref 0.0–1.2)
Total Protein: 6.9 g/dL (ref 6.5–8.1)

## 2024-07-27 MED ORDER — IOHEXOL 300 MG/ML  SOLN
75.0000 mL | Freq: Once | INTRAMUSCULAR | Status: AC | PRN
  Administered 2024-07-27: 75 mL via INTRAVENOUS

## 2024-08-02 ENCOUNTER — Other Ambulatory Visit: Payer: Medicare PPO

## 2024-08-02 ENCOUNTER — Ambulatory Visit: Payer: Medicare PPO | Admitting: Internal Medicine

## 2024-08-03 ENCOUNTER — Inpatient Hospital Stay (HOSPITAL_BASED_OUTPATIENT_CLINIC_OR_DEPARTMENT_OTHER): Admitting: Internal Medicine

## 2024-08-03 VITALS — BP 104/64 | HR 83 | Temp 97.6°F | Resp 17 | Wt 160.5 lb

## 2024-08-03 DIAGNOSIS — C3490 Malignant neoplasm of unspecified part of unspecified bronchus or lung: Secondary | ICD-10-CM

## 2024-08-03 DIAGNOSIS — Z85118 Personal history of other malignant neoplasm of bronchus and lung: Secondary | ICD-10-CM | POA: Diagnosis not present

## 2024-08-03 NOTE — Progress Notes (Signed)
 [facility] Telephone:(336) [phone]   Fax:(336) [phone]  OFFICE PROGRESS NOTE  Pcp, No No address on file  DIAGNOSIS: Stage IA (T1b, N0, M0) non-small cell lung cancer, adenocarcinoma presented with right upper lobe groundglass opacity  PRIOR THERAPY:  Status post right upper lobe wedge resection with lymph node dissection under the care of Dr. Kerrin on December 25, 2022.   CURRENT THERAPY: Observation  INTERVAL HISTORY: Donna Dillon 78 y.o. female returns to the clinic today for 78-month follow-up visit. Discussed the use of AI scribe software for clinical note transcription with the patient, who gave verbal consent to proceed.  History of Present Illness Donna Dillon is a 78 year old female with stage one A non-small cell lung cancer, adenocarcinoma, who presents for evaluation and repeat CT scan for restaging of her disease.  She underwent a right upper lobe wedge resection in January 2024 and has been on observation since then. She is here for a repeat CT scan to restage her disease. She feels nervous about the visit.  In early August 2025, she experienced a fall from her bed while deleting text messages in the dark, resulting in a head injury. She did not seek emergency care at the time. She was concerned about potential internal bleeding and stated that she thought her lung was bleeding, but no evidence of recurrent or metastatic disease was found.  She has a history of a hiatal hernia, which has caused her food to become stuck in her esophagus multiple times, requiring medical intervention to remove it. This has happened three or four times, most recently on a Sunday, but the food eventually went down without requiring emergency care.     MEDICAL HISTORY: Past Medical History:  Diagnosis Date   Anemia    mild   Anxiety    Colon polyp    hyperplastic   Diverticulosis    Esophageal stricture    GERD (gastroesophageal reflux disease)     no s/s now- off medicines   Hyperlipidemia    Palpitations    Pneumonia    PONV (postoperative nausea and vomiting)    Prediabetes    Tachycardia     ALLERGIES:  is allergic to prednisone, penicillins, asa [aspirin], levaquin  [levofloxacin ], macrolides and ketolides, paxil [paroxetine hcl], tetracyclines & related, zoloft [sertraline hcl], sulfa antibiotics, and trimethoprim.  MEDICATIONS:  Current Outpatient Medications  Medication Sig Dispense Refill   atenolol  (TENORMIN ) 25 MG tablet Take 1 tablet (25 mg total) by mouth daily. 90 tablet 1   clomiPRAMINE  (ANAFRANIL ) 50 MG capsule Take 1 capsule (50 mg total) by mouth at bedtime. 90 capsule 2   clonazePAM  (KLONOPIN ) 1 MG tablet Take 1.5 tablets (1.5 mg total) by mouth at bedtime. 45 tablet 5   ezetimibe  (ZETIA ) 10 MG tablet Take  1 tablet  Daily  for Cholesterol 90 tablet 1   Propylene Glycol (SYSTANE BALANCE) 0.6 % SOLN Place 1 drop into both eyes as needed (dry eyes).     QUEtiapine  (SEROQUEL ) 25 MG tablet Take 1 tablet (25 mg total) by mouth at bedtime. 90 tablet 3   No current facility-administered medications for this visit.    SURGICAL HISTORY:  Past Surgical History:  Procedure Laterality Date   BREAST BIOPSY Left    BRONCHIAL NEEDLE ASPIRATION BIOPSY  12/25/2022   Procedure: BRONCHIAL NEEDLE ASPIRATION BIOPSIES;  Surgeon: Gladis Leonor HERO, MD;  Location: [facility] ENDOSCOPY;  Service: Pulmonary;;   CESAREAN SECTION  x1   COLONOSCOPY  2017   eye lift     EYE SURGERY     lasix   FIDUCIAL MARKER PLACEMENT  12/25/2022   Procedure: FIDUCIAL DYE MARKING;  Surgeon: Gladis Leonor HERO, MD;  Location: [facility] ENDOSCOPY;  Service: Pulmonary;;   INTERCOSTAL NERVE BLOCK Right 12/25/2022   Procedure: INTERCOSTAL NERVE BLOCK;  Surgeon: Kerrin Elspeth BROCKS, MD;  Location: [facility] OR;  Service: Thoracic;  Laterality: Right;   NODE DISSECTION Right 12/25/2022   Procedure: NODE DISSECTION;  Surgeon: Kerrin Elspeth BROCKS, MD;  Location: [facility] OR;   Service: Thoracic;  Laterality: Right;   POLYPECTOMY     TONSILLECTOMY     UPPER GASTROINTESTINAL ENDOSCOPY      REVIEW OF SYSTEMS:  A comprehensive review of systems was negative except for: Behavioral/Psych: positive for anxiety   PHYSICAL EXAMINATION: General appearance: alert, cooperative, and no distress Head: Normocephalic, without obvious abnormality, atraumatic Neck: no adenopathy, no JVD, supple, symmetrical, trachea midline, and thyroid  not enlarged, symmetric, no tenderness/mass/nodules Lymph nodes: Cervical, supraclavicular, and axillary nodes normal. Resp: clear to auscultation bilaterally Back: symmetric, no curvature. ROM normal. No CVA tenderness. Cardio: regular rate and rhythm, S1, S2 normal, no murmur, click, rub or gallop GI: soft, non-tender; bowel sounds normal; no masses,  no organomegaly Extremities: extremities normal, atraumatic, no cyanosis or edema  ECOG PERFORMANCE STATUS: 1 - Symptomatic but completely ambulatory  Blood pressure 104/64, pulse 83, temperature 97.6 F (36.4 C), resp. rate 17, weight 160 lb 8 oz (72.8 kg), last menstrual period 12/09/1994, SpO2 99%.  LABORATORY DATA: Lab Results  Component Value Date   WBC 6.9 07/27/2024   HGB 13.5 07/27/2024   HCT 41.3 07/27/2024   MCV 88.2 07/27/2024   PLT 241 07/27/2024      Chemistry      Component Value Date/Time   NA 139 07/27/2024 1444   K 3.9 07/27/2024 1444   CL 104 07/27/2024 1444   CO2 28 07/27/2024 1444   BUN 10 07/27/2024 1444   CREATININE 0.83 07/27/2024 1444   CREATININE 0.81 11/26/2023 1520      Component Value Date/Time   CALCIUM  9.4 07/27/2024 1444   ALKPHOS 84 07/27/2024 1444   AST 10 (L) 07/27/2024 1444   ALT 17 07/27/2024 1444   BILITOT 0.5 07/27/2024 1444       RADIOGRAPHIC STUDIES: CT Chest W Contrast Result Date: 07/30/2024 CLINICAL DATA:  Non-small-cell lung cancer restaging, recent fall * Tracking Code: BO * EXAM: CT CHEST WITH CONTRAST TECHNIQUE:  Multidetector CT imaging of the chest was performed during intravenous contrast administration. RADIATION DOSE REDUCTION: This exam was performed according to the departmental dose-optimization program which includes automated exposure control, adjustment of the mA and/or kV according to patient size and/or use of iterative reconstruction technique. CONTRAST:  75mL OMNIPAQUE  IOHEXOL  300 MG/ML  SOLN COMPARISON:  01/28/2024 FINDINGS: Cardiovascular: Aortic atherosclerosis. Normal heart size. No pericardial effusion. Mediastinum/Nodes: No enlarged mediastinal, hilar, or axillary lymph nodes. Moderate hiatal hernia with intrathoracic position of the gastric fundus. Thyroid  gland, trachea, and esophagus demonstrate no significant findings. Lungs/Pleura: Unchanged postoperative appearance of the chest status post right upper lobe wedge resection. Unchanged bandlike scarring of the posterior lingula (series 7, image 108). No pleural effusion or pneumothorax. Upper Abdomen: No acute abnormality.  Hepatic steatosis. Musculoskeletal: No chest wall abnormality. No acute osseous findings. IMPRESSION: 1. Unchanged postoperative appearance of the chest status post right upper lobe wedge resection. 2. No evidence of recurrent or metastatic disease in the chest.  3. Moderate hiatal hernia with intrathoracic position of the gastric fundus. 4. Hepatic steatosis. Aortic Atherosclerosis (ICD10-I70.0). Electronically Signed   By: Marolyn JONETTA Jaksch M.D.   On: 07/30/2024 07:48    ASSESSMENT AND PLAN: This is a very pleasant 78 years old white female with a stage IA (T1b, N0, M0) non-small cell lung cancer, adenocarcinoma presented with right upper lobe groundglass opacity status post right upper lobe wedge resection with lymph node dissection under the care of Dr. Kerrin on December 25, 2022.  She is currently on observation and feeling fine.  She had repeat CT scan of the chest performed recently.  I personally and independently  reviewed the scan and discussed the result with the patient today.  Her scan showed no concerning findings for disease recurrence or metastasis. Assessment and Plan Assessment & Plan Non-small cell lung cancer, status post right upper lobe resection, under surveillance Stage 1A non-small cell lung cancer, adenocarcinoma, status post right upper lobe wedge resection in January 2024. Currently under observation with no evidence of recurrent or metastatic disease on recent CT scan. - Continue surveillance with CT scan of the chest in 6 months - Follow-up appointment in 6 months for evaluation  Hiatal hernia with recurrent food impaction and regurgitation Hiatal hernia with recurrent episodes of food impaction and regurgitation, causing significant discomfort. Several episodes required intervention to remove impacted food, suggesting worsening hernia with potential for increased regurgitation and discomfort. - Advise her to contact Dr. Norleen Kiang at [facility] for evaluation and management of hiatal hernia She was advised to call immediately if she has any other concerning symptoms in the interval. The patient voices understanding of current disease status and treatment options and is in agreement with the current care plan.  All questions were answered. The patient knows to call the clinic with any problems, questions or concerns. We can certainly see the patient much sooner if necessary.  The total time spent in the appointment was 20 minutes.  Disclaimer: This note was dictated with voice recognition software. Similar sounding words can inadvertently be transcribed and may not be corrected upon review.

## 2024-08-04 ENCOUNTER — Telehealth: Payer: Self-pay | Admitting: Internal Medicine

## 2024-08-04 NOTE — Telephone Encounter (Signed)
 Left message for pt to call back.  Pt scheduled to see Alan Coombs PA 08/16/24@840am . Pt aware of appt.

## 2024-08-04 NOTE — Telephone Encounter (Signed)
 Inbound call from patient stating food is starting to get stuck in her esophagus again. Patient is requesting a call back to discuss further. Please advise, thank you

## 2024-08-13 NOTE — Progress Notes (Signed)
 08/16/2024 Donna Dillon [id number] 1946/07/07  Referring provider: No ref. provider found Primary GI doctor: Dr. Abran  ASSESSMENT AND PLAN:  GERD with history of peptic stricture and food impaction, has had acute food impaction with water regurgitation about 2 weeks ago 11/2019 EGD for dysphagia previous dilations January and February 2020 showed dense fibrous stricture of the distal esophagus status post biopsy disruption and balloon dilation to 20 mm, moderate size hiatal hernia with Ole erosions otherwise unremarkable -Schedule EGD with dilatation to evaluate for stenosis, tumor, erosive/infectious esophagititis, and EOE. I discussed risks of EGD with patient today, including risk of sedation, bleeding or perforation.  Patient provides understanding and gave verbal consent to proceed. - consider barium swallow -In the interim patient advised about swallowing precautions.  -Eat slowly, chew food well before swallowing.  -Drink liquids in between each bite to avoid food impaction. - ER precautions discussed with the patient  Personal history of adenomatous polyps 03/05/2016 colonoscopy 3 polyps 4 to 8 mm in ascending colon, diverticulosis sigmoid colon recall 5 years Recall 2022 however patient is not having any symptoms Patient is also over general screening age, can discuss next OV, will not delay EGD   History of adenocarcinoma right upper lobe/COPD Follows with Dr. Gatha Status post partial lobectomy 2024 Not on oxygen, minor DOE, no chest pain  Patient Care Team: Pcp, No as PCP - General Octavia Charleston, MD as Consulting Physician (Ophthalmology) Abran Norleen SAILOR, MD as Consulting Physician (Gastroenterology) Teressa Toribio SQUIBB, MD (Inactive) as Attending Physician (Gastroenterology) Cleotilde Ronal RAMAN, MD as Consulting Physician (Gynecology)  HISTORY OF PRESENT ILLNESS: 78 y.o. female with a past medical history listed below presents for evaluation of dysphagia.    Patient last seen in the office 12/22/2018 by Dr. Abran for dysphagia and transient food impaction.  Discussed the use of AI scribe software for clinical note transcription with the patient, who gave verbal consent to proceed.  History of Present Illness   Donna Dillon is a 78 year old female with a history of hiatal hernia who presents with difficulty swallowing and esophageal obstruction.  Approximately two weeks ago, she experienced an episode of esophageal obstruction while dining at a restaurant, described as food getting 'stuck' at the bottom of her esophagus near the stomach. This episode was severe, almost causing syncope, and required drinking water to pass the obstruction. She has had similar episodes in the past, occasionally necessitating emergency room visits.  Since the recent episode, she has been cautious with her diet, choosing small, finely chewed foods to prevent further obstruction. She experiences discomfort when swallowing certain foods, such as apple peel, but has not had any further complete obstructions. No heartburn or reflux symptoms are present. The patient reports that a recent CT scan showed a moderate hiatal hernia.  In the review of symptoms, she reports needing to clear her throat in the mornings but not at other times. No hoarseness, excessive mucus, dark stools, nausea, vomiting, or changes in bowel habits. She denies the use of NSAIDs such as Aleve or ibuprofen and does not smoke. She drinks alcohol  socially but not excessively. She has a history of lung surgery, resulting in some shortness of breath, but denies any current chest pain or odynophagia.        She  reports that she has never smoked. She has been exposed to tobacco smoke. She has never used smokeless tobacco. She reports that she does not drink alcohol  and does not  use drugs.  RELEVANT GI HISTORY, IMAGING AND LABS: Results   RADIOLOGY CT scan: Moderate hiatal hernia (08/02/2024)       CBC    Component Value Date/Time   WBC 6.9 07/27/2024 1444   WBC 8.2 11/26/2023 1520   RBC 4.68 07/27/2024 1444   HGB 13.5 07/27/2024 1444   HCT 41.3 07/27/2024 1444   PLT 241 07/27/2024 1444   MCV 88.2 07/27/2024 1444   MCH 28.8 07/27/2024 1444   MCHC 32.7 07/27/2024 1444   RDW 13.3 07/27/2024 1444   LYMPHSABS 2.2 07/27/2024 1444   MONOABS 0.6 07/27/2024 1444   EOSABS 0.2 07/27/2024 1444   BASOSABS 0.1 07/27/2024 1444   Recent Labs    11/26/23 1520 01/30/24 1556 07/27/24 1444  HGB 14.0 13.5 13.5    CMP     Component Value Date/Time   NA 139 07/27/2024 1444   K 3.9 07/27/2024 1444   CL 104 07/27/2024 1444   CO2 28 07/27/2024 1444   GLUCOSE 92 07/27/2024 1444   BUN 10 07/27/2024 1444   CREATININE 0.83 07/27/2024 1444   CREATININE 0.81 11/26/2023 1520   CALCIUM  9.4 07/27/2024 1444   PROT 6.9 07/27/2024 1444   ALBUMIN 4.3 07/27/2024 1444   AST 10 (L) 07/27/2024 1444   ALT 17 07/27/2024 1444   ALKPHOS 84 07/27/2024 1444   BILITOT 0.5 07/27/2024 1444   GFRNONAA >60 07/27/2024 1444   GFRNONAA 77 02/15/2021 1546   GFRAA 89 02/15/2021 1546      Latest Ref Rng & Units 07/27/2024    2:44 PM 11/26/2023    3:20 PM 07/30/2023    2:54 PM  Hepatic Function  Total Protein 6.5 - 8.1 g/dL 6.9  7.1  7.0   Albumin 3.5 - 5.0 g/dL 4.3   4.0   AST 15 - 41 U/L 10  8  14    ALT 0 - 44 U/L 17  13  21    Alk Phosphatase 38 - 126 U/L 84   72   Total Bilirubin 0.0 - 1.2 mg/dL 0.5  0.5  0.6       Current Medications:    Current Outpatient Medications (Cardiovascular):    atenolol  (TENORMIN ) 25 MG tablet, Take 1 tablet (25 mg total) by mouth daily.   ezetimibe  (ZETIA ) 10 MG tablet, Take  1 tablet  Daily  for Cholesterol     Current Outpatient Medications (Other):    clomiPRAMINE  (ANAFRANIL ) 50 MG capsule, Take 1 capsule (50 mg total) by mouth at bedtime.   clonazePAM  (KLONOPIN ) 1 MG tablet, Take 1.5 tablets (1.5 mg total) by mouth at bedtime.   Propylene Glycol (SYSTANE  BALANCE) 0.6 % SOLN, Place 1 drop into both eyes as needed (dry eyes).   QUEtiapine  (SEROQUEL ) 25 MG tablet, Take 1 tablet (25 mg total) by mouth at bedtime.  Medical History:  Past Medical History:  Diagnosis Date   Anemia    mild   Anxiety    Colon polyp    hyperplastic   Diverticulosis    Esophageal stricture    GERD (gastroesophageal reflux disease)    no s/s now- off medicines   Hyperlipidemia    Palpitations    Pneumonia    PONV (postoperative nausea and vomiting)    Prediabetes    Tachycardia    Allergies:  Allergies  Allergen Reactions   Prednisone     Altered mental state   Penicillins Hives and Itching   Asa [Aspirin] Diarrhea   Levaquin  [Levofloxacin ] Other (See  Comments)    Joint Pain   Macrolides And Ketolides Nausea And Vomiting   Paxil [Paroxetine Hcl] Nausea And Vomiting   Tetracyclines & Related Nausea And Vomiting   Zoloft [Sertraline Hcl] Nausea And Vomiting   Sulfa Antibiotics Rash   Trimethoprim Rash     Surgical History:  She  has a past surgical history that includes Tonsillectomy; Breast biopsy (Left); Cesarean section; Upper gastrointestinal endoscopy; Polypectomy; eye lift; Colonoscopy (2017); Eye surgery; Intercostal nerve block (Right, 12/25/2022); Node dissection (Right, 12/25/2022); Bronchial needle aspiration biopsy (12/25/2022); and Fiducial marker placement (12/25/2022). Family History:  Her family history includes Colon cancer in her paternal aunt; Emphysema in her father; Heart disease in her mother; Hypertension in her mother; Kidney cancer in her father.  REVIEW OF SYSTEMS  : All other systems reviewed and negative except where noted in the History of Present Illness.  PHYSICAL EXAM: BP 100/60   Pulse 82   Ht 5' 6.5 (1.689 m)   Wt 160 lb 9.6 oz (72.8 kg)   LMP 12/09/1994   BMI 25.53 kg/m  Physical Exam   GENERAL APPEARANCE: Well nourished, in no apparent distress HEENT: No cervical lymphadenopathy, unremarkable thyroid , sclerae  anicteric, conjunctiva pink RESPIRATORY: Respiratory effort normal, BS equal bilateral without rales, rhonchi, wheezing CARDIO: RRR with no MRGs, peripheral pulses intact ABDOMEN: Soft, non distended, active bowel sounds in all 4 quadrants, no tenderness to palpation, no rebound, no mass appreciated RECTAL: declines MUSCULOSKELETAL: Full ROM, normal gait, without edema SKIN: Dry, intact without rashes or lesions. No jaundice. NEURO: Alert, oriented, no focal deficits PSYCH: Cooperative, normal mood and affect.      Alan JONELLE Coombs, PA-C 9:34 AM

## 2024-08-16 ENCOUNTER — Encounter: Payer: Self-pay | Admitting: Physician Assistant

## 2024-08-16 ENCOUNTER — Ambulatory Visit: Admitting: Physician Assistant

## 2024-08-16 VITALS — BP 100/60 | HR 82 | Ht 66.5 in | Wt 160.6 lb

## 2024-08-16 DIAGNOSIS — Z8619 Personal history of other infectious and parasitic diseases: Secondary | ICD-10-CM

## 2024-08-16 DIAGNOSIS — K219 Gastro-esophageal reflux disease without esophagitis: Secondary | ICD-10-CM | POA: Diagnosis not present

## 2024-08-16 DIAGNOSIS — R1310 Dysphagia, unspecified: Secondary | ICD-10-CM

## 2024-08-16 DIAGNOSIS — Z860101 Personal history of adenomatous and serrated colon polyps: Secondary | ICD-10-CM | POA: Diagnosis not present

## 2024-08-16 NOTE — Patient Instructions (Addendum)
 _______________________________________________________  If your blood pressure at your visit was 140/90 or greater, please contact your primary care physician to follow up on this.  _______________________________________________________  If you are age 77 or older, your body mass index should be between 23-30. Your Body mass index is 25.53 kg/m. If this is out of the aforementioned range listed, please consider follow up with your Primary Care Provider.  If you are age 22 or younger, your body mass index should be between 19-25. Your Body mass index is 25.53 kg/m. If this is out of the aformentioned range listed, please consider follow up with your Primary Care Provider.   ________________________________________________________  The [facility] providers would like to encourage you to use MYCHART to communicate with providers for non-urgent requests or questions.  Due to long hold times on the telephone, sending your provider a message by [facility] may be a faster and more efficient way to get a response.  Please allow 48 business hours for a response.  Please remember that this is for non-urgent requests.  _______________________________________________________  [facility] is using a team-based approach to care.  Your team is made up of your doctor and two to three APPS. Our APPS (Nurse Practitioners and Physician Assistants) work with your physician to ensure care continuity for you. They are fully qualified to address your health concerns and develop a treatment plan. They communicate directly with your gastroenterologist to care for you. Seeing the Advanced Practice Practitioners on your physician's team can help you by facilitating care more promptly, often allowing for earlier appointments, access to diagnostic testing, procedures, and other specialty referrals.   You have been scheduled for an endoscopy. Please follow written instructions given to you at your visit  today.  If you use inhalers (even only as needed), please bring them with you on the day of your procedure.  If you take any of the following medications, they will need to be adjusted prior to your procedure:   DO NOT TAKE 7 DAYS PRIOR TO TEST- Trulicity (dulaglutide) Ozempic, Wegovy (semaglutide) Mounjaro (tirzepatide) Bydureon Bcise (exanatide extended release)  DO NOT TAKE 1 DAY PRIOR TO YOUR TEST Rybelsus (semaglutide) Adlyxin (lixisenatide) Victoza (liraglutide) Byetta (exanatide) ___________________________________________________________________________  Due to recent changes in healthcare laws, you may see the results of your imaging and laboratory studies on MyChart before your provider has had a chance to review them.  We understand that in some cases there may be results that are confusing or concerning to you. Not all laboratory results come back in the same time frame and the provider may be waiting for multiple results in order to interpret others.  Please give us  48 hours in order for your provider to thoroughly review all the results before contacting the office for clarification of your results.   I'm a fan of Lauraine Pereyra, NP with  [facility] [employer] [facility], Tinnie Arlean Harada, NP at [facility], and Samantha J. Job, [place] at [facility].    Dysphagia precautions:  2. Begin meals with warm beverage 3. Eat smaller more frequent meals 4. Eat slowly, taking small bites and sips 5. Alternate solids and liquids 6. Avoid foods/liquids that increase acid production 7. Sit upright during and for 30+ minutes after meals to facilitate esophageal clearing 8. All meats should be chopped finely.   If something gets hung in your esophagus and will not come up or go down, proceed to the emergency room.    Thank you for entrusting me with your  care and choosing [facility].  Alan Coombs, PA-C

## 2024-08-18 ENCOUNTER — Encounter: Payer: Self-pay | Admitting: Internal Medicine

## 2024-08-18 ENCOUNTER — Ambulatory Visit (AMBULATORY_SURGERY_CENTER): Admitting: Internal Medicine

## 2024-08-18 VITALS — BP 96/64 | HR 61 | Temp 98.0°F | Resp 11 | Ht 66.5 in | Wt 160.0 lb

## 2024-08-18 DIAGNOSIS — K449 Diaphragmatic hernia without obstruction or gangrene: Secondary | ICD-10-CM | POA: Diagnosis not present

## 2024-08-18 DIAGNOSIS — K219 Gastro-esophageal reflux disease without esophagitis: Secondary | ICD-10-CM | POA: Diagnosis not present

## 2024-08-18 DIAGNOSIS — K222 Esophageal obstruction: Secondary | ICD-10-CM | POA: Diagnosis not present

## 2024-08-18 DIAGNOSIS — R1310 Dysphagia, unspecified: Secondary | ICD-10-CM | POA: Diagnosis not present

## 2024-08-18 MED ORDER — SODIUM CHLORIDE 0.9 % IV SOLN: 500.0000 mL | Freq: Once | INTRAVENOUS | Status: DC

## 2024-08-18 MED ORDER — PANTOPRAZOLE SODIUM 40 MG PO TBEC: 40.0000 mg | DELAYED_RELEASE_TABLET | Freq: Every day | ORAL | 11 refills | Status: AC

## 2024-08-18 NOTE — Op Note (Signed)
 [facility] Patient Name: Donna Dillon Procedure Date: 08/18/2024 2:36 PM MRN: [id number] Endoscopist: Norleen SAILOR. Abran , MD, [id number] Age: 78 Referring MD:  Date of Birth: 02-Jun-1946 Gender: Female Account #: [id number] Procedure:                Upper GI endoscopy with balloon dilation of the                            esophagus, 20 mm max Indications:              Dysphagia, Therapeutic procedure, Esophageal reflux Medicines:                Monitored Anesthesia Care Procedure:                Pre-Anesthesia Assessment:                           - Prior to the procedure, a History and Physical                            was performed, and patient medications and                            allergies were reviewed. The patient's tolerance of                            previous anesthesia was also reviewed. The risks                            and benefits of the procedure and the sedation                            options and risks were discussed with the patient.                            All questions were answered, and informed consent                            was obtained. Prior Anticoagulants: The patient has                            taken no anticoagulant or antiplatelet agents. ASA                            Grade Assessment: II - A patient with mild systemic                            disease. After reviewing the risks and benefits,                            the patient was deemed in satisfactory condition to                            undergo the procedure.  After obtaining informed consent, the endoscope was                            passed under direct vision. Throughout the                            procedure, the patient's blood pressure, pulse, and                            oxygen saturations were monitored continuously. The                            GIF [id number] #[id number] was introduced through the                             mouth, and advanced to the second part of duodenum.                            After obtaining informed consent, the endoscope was                            passed under direct vision. Throughout the                            procedure, the patient's blood pressure, pulse, and                            oxygen saturations were monitored continuously.The                            upper GI endoscopy was accomplished without                            difficulty. The patient tolerated the procedure                            well. Scope In: Scope Out: Findings:                 One benign-appearing, dense ringlike intrinsic                            moderate stenosis was found 35 cm from the                            incisors. There was mild associated esophagitis.                            This stenosis measured 1.4 cm (inner diameter). A                            TTS dilator was passed through the scope. Dilation                            with an 18-19-20 mm  balloon dilator was performed                            to 20 mm. The ring was disrupted. See images                           The exam of the esophagus was otherwise normal.                           The stomach revealed a large hiatal hernia, but no                            other significant abnormalities.                           The examined duodenum was normal.                           The cardia and gastric fundus were normal on                            retroflexion. Complications:            No immediate complications. Estimated Blood Loss:     Estimated blood loss: none. Impression:               - Benign-appearing esophageal stenosis. Dilated.                           - Normal stomach save large hiatal hernia.                           - Normal examined duodenum.                           - No specimens collected. Recommendation:           - Patient has a contact number available for                             emergencies. The signs and symptoms of potential                            delayed complications were discussed with the                            patient. Return to normal activities tomorrow.                            Written discharge instructions were provided to the                            patient.                           - Post dilation diet.                           -  Continue present medications.                           - Prescribed pantoprazole  40 mg daily; #30; 11                            refills. Take 1 daily to protect your esophagus                           - Office follow-up with Dr. Abran in 3 months Norleen SAILOR. Abran, MD 08/18/2024 3:02:05 PM This report has been signed electronically.

## 2024-08-18 NOTE — Progress Notes (Signed)
 Report to PACU, RN, vss, BBS= Clear.

## 2024-08-18 NOTE — Patient Instructions (Addendum)
 Follow post dilation diet today. Resume previous medications. Start taking Pantoprazole  40 mg daily as prescribed. Follow up with Dr. Abran in the office (3 months) at appointment to be scheduled.   YOU HAD AN ENDOSCOPIC PROCEDURE TODAY AT THE [facility]:   Refer to the procedure report that was given to you for any specific questions about what was found during the examination.  If the procedure report does not answer your questions, please call your gastroenterologist to clarify.  If you requested that your care partner not be given the details of your procedure findings, then the procedure report has been included in a sealed envelope for you to review at your convenience later.  YOU SHOULD EXPECT: Some feelings of bloating in the abdomen. Passage of more gas than usual.  Walking can help get rid of the air that was put into your GI tract during the procedure and reduce the bloating. If you had a lower endoscopy (such as a colonoscopy or flexible sigmoidoscopy) you may notice spotting of blood in your stool or on the toilet paper. If you underwent a bowel prep for your procedure, you may not have a normal bowel movement for a few days.  Please Note:  You might notice some irritation and congestion in your nose or some drainage.  This is from the oxygen used during your procedure.  There is no need for concern and it should clear up in a day or so.  SYMPTOMS TO REPORT IMMEDIATELY:  Following upper endoscopy (EGD)  Vomiting of blood or coffee ground material  New chest pain or pain under the shoulder blades  Painful or persistently difficult swallowing  New shortness of breath  Fever of 100F or higher  Black, tarry-looking stools  For urgent or emergent issues, a gastroenterologist can be reached at any hour by calling [phone]. Do not use MyChart messaging for urgent concerns.    DIET:  We do recommend a small meal at first, but then you may proceed to your regular diet.   Drink plenty of fluids but you should avoid alcoholic beverages for 24 hours.  ACTIVITY:  You should plan to take it easy for the rest of today and you should NOT DRIVE or use heavy machinery until tomorrow (because of the sedation medicines used during the test).    FOLLOW UP: Our staff will call the number listed on your records the next business day following your procedure.  We will call around 7:15- 8:00 am to check on you and address any questions or concerns that you may have regarding the information given to you following your procedure. If we do not reach you, we will leave a message.     If any biopsies were taken you will be contacted by phone or by letter within the next 1-3 weeks.  Please call us  at [phone] if you have not heard about the biopsies in 3 weeks.    SIGNATURES/CONFIDENTIALITY: You and/or your care partner have signed paperwork which will be entered into your electronic medical record.  These signatures attest to the fact that that the information above on your After Visit Summary has been reviewed and is understood.  Full responsibility of the confidentiality of this discharge information lies with you and/or your care-partner.

## 2024-08-18 NOTE — Progress Notes (Signed)
 Called to room to assist during endoscopic procedure.  Patient ID and intended procedure confirmed with present staff. Received instructions for my participation in the procedure from the performing physician.

## 2024-08-18 NOTE — Progress Notes (Signed)
 Expand All Collapse All        08/16/2024 Donna Dillon [id number] October 10, 1946   Referring provider: No ref. provider found Primary GI doctor: Dr. Abran   ASSESSMENT AND PLAN:  GERD with history of peptic stricture and food impaction, has had acute food impaction with water regurgitation about 2 weeks ago 11/2019 EGD for dysphagia previous dilations January and February 2020 showed dense fibrous stricture of the distal esophagus status post biopsy disruption and balloon dilation to 20 mm, moderate size hiatal hernia with Ole erosions otherwise unremarkable -Schedule EGD with dilatation to evaluate for stenosis, tumor, erosive/infectious esophagititis, and EOE. I discussed risks of EGD with patient today, including risk of sedation, bleeding or perforation.  Patient provides understanding and gave verbal consent to proceed. - consider barium swallow -In the interim patient advised about swallowing precautions.  -Eat slowly, chew food well before swallowing.  -Drink liquids in between each bite to avoid food impaction. - ER precautions discussed with the patient   Personal history of adenomatous polyps 03/05/2016 colonoscopy 3 polyps 4 to 8 mm in ascending colon, diverticulosis sigmoid colon recall 5 years Recall 2022 however patient is not having any symptoms Patient is also over general screening age, can discuss next OV, will not delay EGD    History of adenocarcinoma right upper lobe/COPD Follows with Dr. Gatha Status post partial lobectomy 2024 Not on oxygen, minor DOE, no chest pain   Patient Care Team: Pcp, No as PCP - General Octavia Charleston, MD as Consulting Physician (Ophthalmology) Abran Norleen SAILOR, MD as Consulting Physician (Gastroenterology) Teressa Toribio SQUIBB, MD (Inactive) as Attending Physician (Gastroenterology) Cleotilde Ronal RAMAN, MD as Consulting Physician (Gynecology)   HISTORY OF PRESENT ILLNESS: 78 y.o. female with a past medical history listed below presents  for evaluation of dysphagia.    Patient last seen in the office 12/22/2018 by Dr. Abran for dysphagia and transient food impaction.   Discussed the use of AI scribe software for clinical note transcription with the patient, who gave verbal consent to proceed.   History of Present Illness   Donna Dillon is a 78 year old female with a history of hiatal hernia who presents with difficulty swallowing and esophageal obstruction.   Approximately two weeks ago, she experienced an episode of esophageal obstruction while dining at a restaurant, described as food getting 'stuck' at the bottom of her esophagus near the stomach. This episode was severe, almost causing syncope, and required drinking water to pass the obstruction. She has had similar episodes in the past, occasionally necessitating emergency room visits.   Since the recent episode, she has been cautious with her diet, choosing small, finely chewed foods to prevent further obstruction. She experiences discomfort when swallowing certain foods, such as apple peel, but has not had any further complete obstructions. No heartburn or reflux symptoms are present. The patient reports that a recent CT scan showed a moderate hiatal hernia.   In the review of symptoms, she reports needing to clear her throat in the mornings but not at other times. No hoarseness, excessive mucus, dark stools, nausea, vomiting, or changes in bowel habits. She denies the use of NSAIDs such as Aleve or ibuprofen and does not smoke. She drinks alcohol  socially but not excessively. She has a history of lung surgery, resulting in some shortness of breath, but denies any current chest pain or odynophagia.           She  reports that she has never smoked. She  has been exposed to tobacco smoke. She has never used smokeless tobacco. She reports that she does not drink alcohol  and does not use drugs.   RELEVANT GI HISTORY, IMAGING AND LABS: Results   RADIOLOGY CT scan:  Moderate hiatal hernia (08/02/2024)       CBC Labs (Brief)          Component Value Date/Time    WBC 6.9 07/27/2024 1444    WBC 8.2 11/26/2023 1520    RBC 4.68 07/27/2024 1444    HGB 13.5 07/27/2024 1444    HCT 41.3 07/27/2024 1444    PLT 241 07/27/2024 1444    MCV 88.2 07/27/2024 1444    MCH 28.8 07/27/2024 1444    MCHC 32.7 07/27/2024 1444    RDW 13.3 07/27/2024 1444    LYMPHSABS 2.2 07/27/2024 1444    MONOABS 0.6 07/27/2024 1444    EOSABS 0.2 07/27/2024 1444    BASOSABS 0.1 07/27/2024 1444      Recent Labs (within last 365 days)       Recent Labs    11/26/23 1520 01/30/24 1556 07/27/24 1444  HGB 14.0 13.5 13.5        CMP     Labs (Brief)          Component Value Date/Time    NA 139 07/27/2024 1444    K 3.9 07/27/2024 1444    CL 104 07/27/2024 1444    CO2 28 07/27/2024 1444    GLUCOSE 92 07/27/2024 1444    BUN 10 07/27/2024 1444    CREATININE 0.83 07/27/2024 1444    CREATININE 0.81 11/26/2023 1520    CALCIUM  9.4 07/27/2024 1444    PROT 6.9 07/27/2024 1444    ALBUMIN 4.3 07/27/2024 1444    AST 10 (L) 07/27/2024 1444    ALT 17 07/27/2024 1444    ALKPHOS 84 07/27/2024 1444    BILITOT 0.5 07/27/2024 1444    GFRNONAA >60 07/27/2024 1444    GFRNONAA 77 02/15/2021 1546    GFRAA 89 02/15/2021 1546          Latest Ref Rng & Units 07/27/2024    2:44 PM 11/26/2023    3:20 PM 07/30/2023    2:54 PM  Hepatic Function  Total Protein 6.5 - 8.1 g/dL 6.9  7.1  7.0   Albumin 3.5 - 5.0 g/dL 4.3    4.0   AST 15 - 41 U/L 10  8  14    ALT 0 - 44 U/L 17  13  21    Alk Phosphatase 38 - 126 U/L 84    72   Total Bilirubin 0.0 - 1.2 mg/dL 0.5  0.5  0.6       Current Medications:      Current Outpatient Medications (Cardiovascular):    atenolol  (TENORMIN ) 25 MG tablet, Take 1 tablet (25 mg total) by mouth daily.   ezetimibe  (ZETIA ) 10 MG tablet, Take  1 tablet  Daily  for Cholesterol         Current Outpatient Medications (Other):    clomiPRAMINE  (ANAFRANIL ) 50  MG capsule, Take 1 capsule (50 mg total) by mouth at bedtime.   clonazePAM  (KLONOPIN ) 1 MG tablet, Take 1.5 tablets (1.5 mg total) by mouth at bedtime.   Propylene Glycol (SYSTANE BALANCE) 0.6 % SOLN, Place 1 drop into both eyes as needed (dry eyes).   QUEtiapine  (SEROQUEL ) 25 MG tablet, Take 1 tablet (25 mg total) by mouth at bedtime.   Medical History:  Past Medical History:  Diagnosis Date   Anemia      mild   Anxiety     Colon polyp      hyperplastic   Diverticulosis     Esophageal stricture     GERD (gastroesophageal reflux disease)      no s/s now- off medicines   Hyperlipidemia     Palpitations     Pneumonia     PONV (postoperative nausea and vomiting)     Prediabetes     Tachycardia          Allergies:  Allergies       Allergies  Allergen Reactions   Prednisone        Altered mental state   Penicillins Hives and Itching   Asa [Aspirin] Diarrhea   Levaquin  [Levofloxacin ] Other (See Comments)      Joint Pain   Macrolides And Ketolides Nausea And Vomiting   Paxil [Paroxetine Hcl] Nausea And Vomiting   Tetracyclines & Related Nausea And Vomiting   Zoloft [Sertraline Hcl] Nausea And Vomiting   Sulfa Antibiotics Rash   Trimethoprim Rash        Surgical History:  She  has a past surgical history that includes Tonsillectomy; Breast biopsy (Left); Cesarean section; Upper gastrointestinal endoscopy; Polypectomy; eye lift; Colonoscopy (2017); Eye surgery; Intercostal nerve block (Right, 12/25/2022); Node dissection (Right, 12/25/2022); Bronchial needle aspiration biopsy (12/25/2022); and Fiducial marker placement (12/25/2022). Family History:  Her family history includes Colon cancer in her paternal aunt; Emphysema in her father; Heart disease in her mother; Hypertension in her mother; Kidney cancer in her father.   REVIEW OF SYSTEMS  : All other systems reviewed and negative except where noted in the History of Present Illness.   PHYSICAL EXAM: BP 100/60   Pulse  82   Ht 5' 6.5 (1.689 m)   Wt 160 lb 9.6 oz (72.8 kg)   LMP 12/09/1994   BMI 25.53 kg/m  Physical Exam   GENERAL APPEARANCE: Well nourished, in no apparent distress HEENT: No cervical lymphadenopathy, unremarkable thyroid , sclerae anicteric, conjunctiva pink RESPIRATORY: Respiratory effort normal, BS equal bilateral without rales, rhonchi, wheezing CARDIO: RRR with no MRGs, peripheral pulses intact ABDOMEN: Soft, non distended, active bowel sounds in all 4 quadrants, no tenderness to palpation, no rebound, no mass appreciated RECTAL: declines MUSCULOSKELETAL: Full ROM, normal gait, without edema SKIN: Dry, intact without rashes or lesions. No jaundice. NEURO: Alert, oriented, no focal deficits PSYCH: Cooperative, normal mood and affect.       Alan JONELLE Coombs, PA-C 9:34 AM

## 2024-08-18 NOTE — Progress Notes (Signed)
 Vitals-[facility]  Pt's states no medical or surgical changes since previsit or office visit.

## 2024-08-19 ENCOUNTER — Telehealth: Payer: Self-pay

## 2024-08-19 NOTE — Telephone Encounter (Signed)
 No answer, left message to call if having any issues or concerns, B.Vester Titsworth RN

## 2024-09-06 ENCOUNTER — Encounter: Payer: Medicare PPO | Admitting: Internal Medicine

## 2024-11-16 ENCOUNTER — Encounter: Payer: Self-pay | Admitting: Psychiatry

## 2024-11-16 ENCOUNTER — Ambulatory Visit: Admitting: Psychiatry

## 2024-11-16 DIAGNOSIS — F422 Mixed obsessional thoughts and acts: Secondary | ICD-10-CM | POA: Diagnosis not present

## 2024-11-16 DIAGNOSIS — F3342 Major depressive disorder, recurrent, in full remission: Secondary | ICD-10-CM | POA: Diagnosis not present

## 2024-11-16 DIAGNOSIS — F5105 Insomnia due to other mental disorder: Secondary | ICD-10-CM | POA: Diagnosis not present

## 2024-11-16 DIAGNOSIS — F411 Generalized anxiety disorder: Secondary | ICD-10-CM

## 2024-11-16 MED ORDER — CLOMIPRAMINE HCL 50 MG PO CAPS: 50.0000 mg | ORAL_CAPSULE | Freq: Every day | ORAL | 3 refills | Status: AC

## 2024-11-16 MED ORDER — CLONAZEPAM 1 MG PO TABS: 1.5000 mg | ORAL_TABLET | Freq: Every day | ORAL | 5 refills | Status: AC

## 2024-11-16 MED ORDER — CLONAZEPAM 1 MG PO TABS: 1.5000 mg | ORAL_TABLET | Freq: Every day | ORAL | 5 refills | Status: DC

## 2024-11-16 NOTE — Progress Notes (Signed)
 Donna Dillon [id number] 1946/08/26 78 y.o.  Subjective:   Patient ID:  Donna Dillon is a 78 y.o. (DOB 02/07/1946) female.  Chief Complaint:  Chief Complaint  Patient presents with   Follow-up   Anxiety    Donna Dillon presents to the office today for follow-up of OCD.    seen December 2020.  No meds were changed.  seen Apr 10, 2020.  The following is noted: She is markedly worse. falling apart.  More fatigued since dilation of esophagus.  January more dry eyes.  Hair falling out.  TSH is normal.   More sleep problems since Feb. Called and increased clonazepam  to 2 mg HS bc also the insomnia was causing more anxiety.  Now also scared of taking the clonazepam .   Added trazodone  100 mg and it didn't help sleep. More obsessed with heart rate being elevated to 100 bpm.   Can't work DT anxiety and insomnia and fear over her heart. Scared.  She was ruminating. Plan:Increase atenolol  for anxiety and tachycardia to 25 mg daily. DC trazodone  Seroquel  25-50 mg for sleep Needs increased BZ .  She is afraid increasing the clonazepam  but may be willing to take Xanax during the day.  Stay out of work this week. Disc option of Day treatment bc severity of sx and she's afraid of being alone. Option call Behavioral Health Assessment at the main [facility] phone.  Tell them I referred you to the Day treatment program. Return to office in 4 days.  04/14/2020 appointment the following is noted: Took 37.5 mg Seroquel  Monday with 2 mg Klonopin  and awoke in 3 hour. Afterwards usually took Seroquel  50 mg since then with Klonopin .  Lost another 1# and total of 11#/3 weeks.  Eating after taking alprazolam.  Can't quilt or read or focus on TV.  Heart rate is better after increase atenolol  to 25 mg daily. Now 5-6 hours of sleep. NOW REALIZES SHE STOPPED CLOMIPRAMINE  SOME MONTHS AGO ACCIDENTALLY. Plan: Start clomipramine  25 mg 1 capsule for 10 days, then 2 each night or 50 mg nightly. Continue Klonopin  2  mg HS Increase Seroquel  75 mg for sleep and anxiety and rumination Needs increased BZ .  She is afraid increasing the clonazepam  but may be willing to take Xanax during the day.  Ok continue Xanax daytime for anxiety and allow her to eat..  Stay out of work right now.  Is on medical leave. Disc option of Day treatment bc severity of sx and she's afraid of being alone. Option call Behavioral Health Assessment at the main [facility] phone.  Tell them I referred you to the Day treatment program.  04/21/2020 urgent appointment, the following is noted: Generally sleeping better with quetiapine  75 mg HS.  Getting hangover.  Has to take Xanax to eat.  Losing weight 10#.  Just wants to lay down.  Feels shakey and weak. Did get restarted on clomipramine  25 mg HS.  Fatigue and hard to concentrate.   Plan: Continue medical leave until May 29, 2020.   The following med decisions were made: Continue clomipramine  25 mg 1 capsule for 10 days, then 2 each night or 50 mg nightly. Continue Klonopin  2 mg HS Increase Seroquel  75 mg for sleep and anxiety and rumination   05/03/2020 appointment urgently with the following noted:  Doesn't feel better yet.  Seroquel  75 gave her hangover.  With 50 mg usually 8 hours but some EMA esp if anxiety is bad.  Lays in bed until noon  bc doesn't want to do anything  In bed to escape. When first started clomipramine  25 had sig insomnia.  At 50 mg now Appetite still poor and lost 10#.  Forcing food for 6 weeks. Idleness is not good for worry but can't focus well enough to read as much as she would usually otherwise bc anxiety interferes. Taking alprazolam 0.5 mg in pm to help eat dinner.  Getting from [place]. No med changes  05/18/20 appt with the following noted:  Another urgent appt DT severity of anxiety. On clomipramine  50 mg since about 5/14. Appetite not better yet.  Lost total 12-13#.   Not depressed. Feels sleepy.  Always tired.  Wonders if there's medical cause. Sleeping  a lot with Seroquel  50 mg HS. Notices less anxiety by a little.   A little more obsessions since being at home but not severe. Not currently physically able to do the job and embarrassed about being out of work.  Disc concerns about her age and work and wondering about retirement from it fully.  No med changes.  06/07/2020 appointment with the following noted: Sleeping well with clonazepam  2 mg HS and quetiapine  50 mg HS. Still on clomipramine  50 since 04/21/20. Still exhausted from going to grocery store.  Not mentally exhausted but is physically.   Has remained on medical leave but not covered by FMLA bc not working FT. Decided to resign.  Doesn't feel able to do it yet.  Hopes to get to volunteer in the fall. Naps a good deal. Plan: Continue clomipramine  50 mg nightly (started about 5/14) Continue Klonopin  2 mg HS Continue Seroquel  25-50 mg for sleep and anxiety and rumination .  Try reducing as soon as she can to help energy. Ok continue Xanax daytime for anxiety and allow her to eat..  Meds won't work if you don't get enough protein.  08/10/20 appt with the following noted:  Nothing to do but gave job up bc didn't think she could do it physically. Did clean her house well.   Clomipramine  has kicked in but hard time bc nothing to divert her attention.   No anxiety until married. After 5-6 years later got osbessions.  Never compulsions. Not depressed or despairing anymore. Thinking of 12 hour a week job and needs it to get out of the house.  Needs stimulation and activity. Plan: no med changes.  11/13/2020 appointment with the following noted: Stopped quetiapine  a week ago.  Sleep is OK with a little more awakening but not severe.   Some chronic OCD but minimal some chronic anxiety but manageable.  Not depressed.  Sleep is adequate Son Layman doing well with promotion at [employer] managing 20 people. Surprisingly well. Still has compulsions including night eating.  Married since  2016.  Hasn't travelled lately but wants to.  [place].  Has a dog. Plan no med changes:   03/15/2021 appointment with the following noted: A lot of changes in the last year.  Health decline in physical ability to walk.  No particular reason. Lost job and friends at the job.  Lost dog of 12 years.  B died in 12-31-2023, lived in [place].   Was going to volunteer but didn't DT covid. Dx advancing COPD despite not smoking.  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 Clonazepam  helps her and needs the 2 mg dose.  Afraid to reduce it.  Insurance won't cover. Needed to increase clonazepam  to 2 mg HS 1 year ago and  sleeping fine. No seroquel  needed. Not depressed. Son eccentric but genius. Continue clomipramine  50 mg nightly (started about 04/2013) Continue Klonopin  2 mg HS for now but hope to gradually reduce it with goal of about 1 mg eventually.  Unlikely to be able to stop it. Disc way to reduce it in detail. No  med changes today.   09/13/2021 appointment with the following noted: Still can't find things to do or places to volunteer.  Need something to do.   Son Layman works for [employer] and works from home. OCD manageable but not 100% gone. Disc concerns about heart rate.  It's less than 100. Not exercising, but used to. EKG yearly but still worries her. Patient reports stable mood and denies depressed or irritable moods.  Patient denies any recent difficulty with anxiety.  Patient denies difficulty with sleep initiation or maintenance. Denies appetite disturbance.  Patient reports that energy and motivation have been good.  Patient denies any difficulty with concentration.  Patient denies any suicidal ideation. Plan no med changes  05/14/2022 appointment with the following noted: No med changes. Chronic tiredness to some degree.  Sleep good.   Gets SOB with exertion.  More physical problems.  Dental problems, gingivitis.  Some leg swelling. Sleep well.  Not very  active.  Pending card referral.  Not sleepy in the AM Son getting divorced but he's not good socially.  Pt making him dinner.  11/13/22 appt noted: Some med issues with clonazepam  RF.  Was still getting 2 mg size. Taking clonazepam  1.5 mg HS and clomipramine  50 mg HS and a little quetiapine .   Quetiapine  helps sleep at 12.5 mg HS. Has had some health problems and including pulm nodule and needs Bx.  Pending and likely lobectomy.  Disc stress of it and doesn't feel she can reduce the clonazepam . Feels SOB and tired.  Coughs in the AM when gets up. Not sig depressed.    05/15/23 appt noted: Taking clonazepam  1.5 mg HS and clomipramine  50 mg HS and a little quetiapine .   Quetiapine  helps sleep at 12.5 mg HS. Quetiapine  helps sleep a lot.  Doesn't sleep well enough with clonazepam  alone.   Had a rough time bc Had lung 9 mm adenocarcinoma resected in January.   Couldn't sleep while in the hospital.  Get crazy if can't sleep.  Was very stressful.  Doesn't feels she has fully recovered.  Hs some numbness from the nerve block.   Has not had energy to do things but so bored and needs more activity.   Some fears of recurrence.   Tiredness gradually better but still a problems. SOB with exertion and coughing.  Worries over her heart and lungs.     11/25/23 appt noted: Taking clonazepam  1.25 mg HS and clomipramine  50 mg HS and a little quetiapine .   Quetiapine  helps sleep at 12.5 mg HS. Multiple stressors health wise, hair loss, skin dry. B died and beloved Whippet died.  Water damage at home.   Had lung carcinoadenoma of lung and had surgery.   Wakes at night to urinate but seems to need an extraordinary amount of sleep. So bored all the time and can't find anything to do that she can hold up to do. Can't get up in time to volunteer at school.  Feels better at night and so wants to stay up at night and then sleep late. [place] won't travel.  Can't fly to [place] physically.   Frustrated she  doesn't have grand kids.  Did ok with reduction in clonazepam .  Would like to try to go further Plan: Continue clomipramine  50 mg nightly (started about 04/2013) Rec trial of reducing Klonopin  to 1.5 mg HS bc of age increase risk of SE. Unlikely to be able to stop it. But trial reduce to 1.25 mg recommended. Disc slow taper process.   Continue Seroquel  25 mg HS for TR insomnia. Asks I renew atenolol  25 mg bc lost her bottle  05/26/24 appt noted:  medL  : clonazepam  1.0 mg HS, clomipramine  50 mg HS , Quetiapine  helps sleep at 25 mg HS. Pet Whippit died a couple of years ago.  May get another .   She want sto wait until more volunteer work before reducing it again.  Night person and hard to go to bed early enough to volunteer.   FU lung CA scan in August.   Done ok with less clonazepam  for sleep.   PCP died and hard getting another PCP. Atenolol  helps anxiety a little.  Not depressed.  But bored.  Trying to stay busy.   Son Layman 80 yo and probably won't get kids.  Though he's living with a woman.  He doesn't want kids.  Mornings are worse with OCD and evenings are better.  Sometimes can tlak herself out of it.  Uses catch phrases to fight OCD when it first pops up.   8 hour sleep.   Plan: try again to reduce clonazepam .  11/15/24 appt noted:  meds  : clonazepam  1.0 mg HS, clomipramine  50 mg HS , Quetiapine  helps sleep at 25 mg HS. Doesn't want to reduce clonazepam  further in the winter bc can't get out much in the winter creating more stress.   Would like to be 100% free of OCD but it is status quo.  Clomipramine  is still helping a lot.   FU lung CA in Jan. After that willing to alternate clonazepam  1/0.75 mg every other day for a couple of months. 1 Seroquel  and sleeps well. Typically to bed about 2 am.  More active physically and cleaning at night.  Never done well in the morning.  Still a; night owl.  But volunteers at [employer] 1 day per week.   No sig flare ups OCD since here.  But  bored at home bc H won't get out of town.  Dry eyes interfere with driving at night.     Past Psychiatric Medication Trials:  Clomipramine ,   Poor response SSRIs  Trazodone  100 NR Seroquel  75 hangover Clonazepam  1-2 mg HS  Review of Systems:  Review of Systems  Constitutional:  Positive for fatigue. Negative for appetite change and unexpected weight change.       Lost 10-12 # in 3 weeks  HENT:  Positive for dental problem.   Respiratory:  Positive for shortness of breath.   Cardiovascular:  Positive for leg swelling.  Gastrointestinal:  Negative for abdominal pain.  Neurological:  Negative for dizziness and tremors.  Psychiatric/Behavioral:  Negative for agitation, behavioral problems, confusion, decreased concentration, dysphoric mood, hallucinations, self-injury, sleep disturbance and suicidal ideas. The patient is nervous/anxious. The patient is not hyperactive.     Medications: I have reviewed the patient's current medications.  Current Outpatient Medications  Medication Sig Dispense Refill   atenolol  (TENORMIN ) 25 MG tablet Take 1 tablet (25 mg total) by mouth daily. 90 tablet 1   clomiPRAMINE  (ANAFRANIL ) 50 MG capsule Take 1 capsule (50 mg total) by mouth at bedtime. 90 capsule 2   clonazePAM  (KLONOPIN )  1 MG tablet Take 1.5 tablets (1.5 mg total) by mouth at bedtime. 45 tablet 5   ezetimibe  (ZETIA ) 10 MG tablet Take  1 tablet  Daily  for Cholesterol 90 tablet 1   Multiple Vitamins-Minerals (MULTI FOR HER 50+ PO) as directed Orally     pantoprazole  (PROTONIX ) 40 MG tablet Take 1 tablet (40 mg total) by mouth daily. 30 tablet 11   Propylene Glycol (SYSTANE BALANCE) 0.6 % SOLN Place 1 drop into both eyes as needed (dry eyes).     QUEtiapine  (SEROQUEL ) 25 MG tablet Take 1 tablet (25 mg total) by mouth at bedtime. 90 tablet 3   Current Facility-Administered Medications  Medication Dose Route Frequency Provider Last Rate Last Admin   0.9 %  sodium chloride  infusion  500 mL  Intravenous Once Abran Norleen SAILOR, MD        Medication Side Effects: None, dry  Allergies:  Allergies  Allergen Reactions   Prednisone     Altered mental state   Macrolides And Ketolides Nausea And Vomiting   Paxil [Paroxetine Hcl] Nausea And Vomiting   Penicillins Hives and Itching   Asa [Aspirin] Diarrhea   Levaquin  [Levofloxacin ] Other (See Comments)    Joint Pain   Sulfa Antibiotics Rash   Tetracyclines & Related Nausea And Vomiting   Trimethoprim Rash   Zoloft [Sertraline Hcl] Nausea And Vomiting    Past Medical History:  Diagnosis Date   Anemia    mild   Anxiety    Colon polyp    hyperplastic   Diverticulosis    Esophageal stricture    GERD (gastroesophageal reflux disease)    no s/s now- off medicines   Hyperlipidemia    Palpitations    Pneumonia    PONV (postoperative nausea and vomiting)    Prediabetes    Tachycardia     Family History  Problem Relation Age of Onset   Hypertension Mother    Heart disease Mother    Kidney cancer Father    Emphysema Father        smoked   Colon cancer Paternal Aunt    Colon polyps Neg Hx    Esophageal cancer Neg Hx    Rectal cancer Neg Hx    Stomach cancer Neg Hx     Social History   Socioeconomic History   Marital status: Married    Spouse name: Not on file   Number of children: 1   Years of education: Not on file   Highest education level: Not on file  Occupational History   Occupation: Librarian  Tobacco Use   Smoking status: Never    Passive exposure: Past   Smokeless tobacco: Never  Vaping Use   Vaping status: Never Used  Substance and Sexual Activity   Alcohol  use: No    Alcohol /week: 0.0 standard drinks of alcohol    Drug use: No   Sexual activity: Not on file  Other Topics Concern   Not on file  Social History Narrative   Not on file   Social Drivers of Health   Financial Resource Strain: Not on file  Food Insecurity: Not on file  Transportation Needs: Not on file  Physical Activity:  Not on file  Stress: Not on file  Social Connections: Not on file  Intimate Partner Violence: Not on file    Past Medical History, Surgical history, Social history, and Family history were reviewed and updated as appropriate.   Son has severe anxiety and is treated also.  Please see  review of systems for further details on the patient's review from today.   Objective:   Physical Exam:  LMP 12/09/1994   Physical Exam Constitutional:      General: She is not in acute distress.    Appearance: She is well-developed.  Musculoskeletal:        General: No deformity.  Neurological:     Mental Status: She is alert and oriented to person, place, and time.     Motor: No tremor.     Coordination: Coordination normal.     Gait: Gait normal.  Psychiatric:        Attention and Perception: She is attentive. She does not perceive auditory hallucinations.        Mood and Affect: Mood is anxious. Mood is not depressed. Affect is not labile, blunt, angry, tearful or inappropriate.        Speech: Speech normal. Speech is not slurred.        Behavior: Behavior normal.        Thought Content: Thought content normal. Thought content is not delusional. Thought content does not include homicidal or suicidal ideation. Thought content does not include suicidal plan.        Cognition and Memory: Cognition normal.        Judgment: Judgment normal.     Comments: Insight intact. Residual obsessions if not busy.. No auditory or visual hallucinations. No delusions.  No rumination  Neat .  No sedation.  Pleasant and talkative, fluent      Lab Review:     Component Value Date/Time   NA 139 07/27/2024 1444   K 3.9 07/27/2024 1444   CL 104 07/27/2024 1444   CO2 28 07/27/2024 1444   GLUCOSE 92 07/27/2024 1444   BUN 10 07/27/2024 1444   CREATININE 0.83 07/27/2024 1444   CREATININE 0.81 11/26/2023 1520   CALCIUM  9.4 07/27/2024 1444   PROT 6.9 07/27/2024 1444   ALBUMIN 4.3 07/27/2024 1444   AST 10  (L) 07/27/2024 1444   ALT 17 07/27/2024 1444   ALKPHOS 84 07/27/2024 1444   BILITOT 0.5 07/27/2024 1444   GFRNONAA >60 07/27/2024 1444   GFRNONAA 77 02/15/2021 1546   GFRAA 89 02/15/2021 1546       Component Value Date/Time   WBC 6.9 07/27/2024 1444   WBC 8.2 11/26/2023 1520   RBC 4.68 07/27/2024 1444   HGB 13.5 07/27/2024 1444   HCT 41.3 07/27/2024 1444   PLT 241 07/27/2024 1444   MCV 88.2 07/27/2024 1444   MCH 28.8 07/27/2024 1444   MCHC 32.7 07/27/2024 1444   RDW 13.3 07/27/2024 1444   LYMPHSABS 2.2 07/27/2024 1444   MONOABS 0.6 07/27/2024 1444   EOSABS 0.2 07/27/2024 1444   BASOSABS 0.1 07/27/2024 1444    11/2022 EKG was normal.  .res Assessment: Plan:    Mixed obsessional thoughts and acts  Generalized anxiety disorder  Insomnia due to mental condition  Major depression, recurrent, full remission    Marked  worsening of anxiety and depresssion without obvious worsening of OCD after STOPPING CLOMIPRAMINE  ACCIDENTALLY  in 2020.  30 min session.    But cannot sleep without clonazepam  .   Disc risks TCA including cardiac and risk increasing resting heart rate.  Last EKG ok. Will renew her atenolol  per PCP for this.  We discussed the short-term risks associated with benzodiazepines including sedation and increased fall risk among others.  Discussed long-term side effect risk including dependence, potential withdrawal symptoms, and the potential eventual  dose-related risk of dementia.  But recent studies from 2020 dispute this association between benzodiazepines and dementia risk. Newer studies in 2020 do not support an association with dementia. Disc her fears of addiction.  Wants to eventually consider tapering off of it using the quetiapine  to protect her sleep.  Disc her fears of dose reduction but it is time before she has SE related to age and the BZ.  Extensive discussion.  Counseling 23 min: Supportive therapy and CBT on relapse prevention and what to do to  deal with multiple stressors and cannot work to distract herself.  Needs activity to manage her anxiety.  Problem solved and disc volunteer options to help her feel more useful.   She prefers to stay up late and sleep late.    Continue clomipramine  50 mg nightly (started about 04/2013)  When possible try again to reduce Klonopin  to 0.75 mg/1 mg every other day HS after  holidays bc of age increase risk of SE. Unlikely to be able to stop it. Successfully reduced to 1.0 mg.  Disc slow taper process.    Continue Seroquel  25 mg HS for TR insomnia.  Asks I renew atenolol  25 mg and Zetia  BC PCP died.  OK but needs to get new PCP.  FU 6 mos   Lorene Macintosh, MD, DFAPA   Please see After Visit Summary for patient specific instructions.  Future Appointments  Date Time Provider Department Center  01/25/2025  2:45 PM [facility] None  02/01/2025  3:30 PM Sherrod Sherrod, MD [facility] None    No orders of the defined types were placed in this encounter.     -------------------------------

## 2024-11-25 ENCOUNTER — Ambulatory Visit: Payer: Medicare PPO | Admitting: Nurse Practitioner

## 2024-12-06 ENCOUNTER — Telehealth: Payer: Self-pay | Admitting: Internal Medicine

## 2024-12-06 NOTE — Telephone Encounter (Signed)
 Patient called stating she has a piece of food stuck in her lower esophagus. States she was advised by Dr. Abran if this happens to call immediately. Please advise, thank you

## 2024-12-06 NOTE — Telephone Encounter (Signed)
 Melissa, I appreciate the follow-up. Instead of an office visit, since she is having significant recurrent dysphagia, lets set her up straightaway for repeat EGD with dilation in the [facility]. Thanks, Dr. Abran

## 2024-12-06 NOTE — Telephone Encounter (Signed)
 Spoke with patient. Patient states she had a piece of food get stuck in her lower esophagus over the weekend. Thankfully she was able to get it out. Patient states she just wanted to let Dr Abran know. Patient questioned if she needed to be scheduled for an EGD. Procedure note from 08/18/24 has follow up in 3 months. Patient scheduled to see Dr Abran on 12/28/24 at 3 PM per request.

## 2024-12-07 NOTE — Telephone Encounter (Signed)
 Per PA team insurance will not provide any information until after 12/09/24. Informed patient. Patient aware of prep instructions & will be NPO after midnight in case she can proceed with procedure. Patient only lives about 6 minutes from the office. Patient instructed to call the office around 9 AM on Friday & speak with Rock to see if the EGD has been approved.

## 2024-12-07 NOTE — Telephone Encounter (Signed)
 Spoke to patient. Informed of Dr Nancyann note below. Patient agree's with recommendation and requests for asap appointment. Office follow up canceled. Scheduled EDG with dilation with Dr Abran for 12/10/24 @ 1030. Note sent to PA team to see if we can get this done before the end of today. Will call patient to inform once response is sent.

## 2024-12-10 ENCOUNTER — Ambulatory Visit: Admitting: Internal Medicine

## 2024-12-10 ENCOUNTER — Encounter: Payer: Self-pay | Admitting: Internal Medicine

## 2024-12-10 ENCOUNTER — Other Ambulatory Visit: Payer: Self-pay

## 2024-12-10 VITALS — BP 107/54 | HR 68 | Temp 97.5°F | Resp 15 | Ht 66.5 in | Wt 160.0 lb

## 2024-12-10 DIAGNOSIS — Q399 Congenital malformation of esophagus, unspecified: Secondary | ICD-10-CM

## 2024-12-10 DIAGNOSIS — K219 Gastro-esophageal reflux disease without esophagitis: Secondary | ICD-10-CM

## 2024-12-10 DIAGNOSIS — R1310 Dysphagia, unspecified: Secondary | ICD-10-CM | POA: Diagnosis not present

## 2024-12-10 DIAGNOSIS — K222 Esophageal obstruction: Secondary | ICD-10-CM

## 2024-12-10 DIAGNOSIS — K449 Diaphragmatic hernia without obstruction or gangrene: Secondary | ICD-10-CM

## 2024-12-10 MED ORDER — SODIUM CHLORIDE 0.9 % IV SOLN: 500.0000 mL | INTRAVENOUS | Status: DC

## 2024-12-10 NOTE — Progress Notes (Signed)
 Sedate, gd SR, tolerated procedure well, VSS, report to RN

## 2024-12-10 NOTE — Op Note (Signed)
 [facility] Patient Name: Donna Dillon Procedure Date: 12/10/2024 11:56 AM MRN: [id number] Endoscopist: Norleen SAILOR. Abran , MD, [id number] Age: 79 Referring MD:  Date of Birth: 08/03/1946 Gender: Female Account #: [id number] Procedure:                Upper GI endoscopy with biopsies; balloon dilation                            of the esophagus20 mm Indications:              Therapeutic procedure, Dysphagia, Esophageal                            reflux. Last dilated August 18, 2024. Contacted                            the office recently with recurrent dysphagia and                            transient food impaction Medicines:                Monitored Anesthesia Care Procedure:                Pre-Anesthesia Assessment:                           - Prior to the procedure, a History and Physical                            was performed, and patient medications and                            allergies were reviewed. The patient's tolerance of                            previous anesthesia was also reviewed. The risks                            and benefits of the procedure and the sedation                            options and risks were discussed with the patient.                            All questions were answered, and informed consent                            was obtained. Prior Anticoagulants: The patient has                            taken no anticoagulant or antiplatelet agents. ASA                            Grade Assessment: II - A patient with mild systemic  disease. After reviewing the risks and benefits,                            the patient was deemed in satisfactory condition to                            undergo the procedure.                           After obtaining informed consent, the endoscope was                            passed under direct vision. Throughout the                            procedure, the patient's  blood pressure, pulse, and                            oxygen saturations were monitored continuously. The                            GIF [id number] #[id number] was introduced through the                            mouth, and advanced to the second part of duodenum.                            The upper GI endoscopy was accomplished without                            difficulty. The patient tolerated the procedure                            well. Scope In: Scope Out: Findings:                 The esophagus was somewhat tortuous. The                            gastroesophageal junction was at 33 cm from the                            incisors. At this level was a ringlike stricture                            measuring approximately 15 mm. Biopsies of the                            stricture and multiple areas were taken with a cold                            forceps for stricture disruption. Thereafter, A TTS                            dilator  was passed through the scope. Dilation with                            an 18-19-20 mm balloon dilator was performed to 20                            mm.                           The stomach revealed a large hiatal hernia that was                            normal.                           The examined duodenum was normal.                           The cardia and gastric fundus were normal on                            retroflexion. Complications:            No immediate complications. Estimated Blood Loss:     Estimated blood loss: none. Impression:               1. Esophageal stricture biopsy disruption then                            balloon dilation.                           2. Large hiatal hernia                           3. Otherwise unremarkable EGD                           4. GERD. Recommendation:           1. Patient has a contact number available for                            emergencies. The signs and symptoms of potential                             delayed complications were discussed with the                            patient. Return to normal activities tomorrow.                            Written discharge instructions were provided to the                            patient.                           2.  Post dilation diet.                           3. Continue present medications.                           4. Office follow-up with Dr. Abran in 3 months.                            Contact the office in the interim for any questions                            or problems Vernie Vinciguerra N. Abran, MD 12/10/2024 12:23:03 PM This report has been signed electronically.

## 2024-12-10 NOTE — Patient Instructions (Addendum)
 Post dilation diet for today Continue present medications Office visit with Dr Abran 03/01/25 at 240 PM, Call Dr Nancyann office in the interim if any problems or concerns  Handouts/information given for post dilation diet, GERD and hiatal hernia  YOU HAD AN ENDOSCOPIC PROCEDURE TODAY AT THE [facility]:   Refer to the procedure report that was given to you for any specific questions about what was found during the examination.  If the procedure report does not answer your questions, please call your gastroenterologist to clarify.  If you requested that your care partner not be given the details of your procedure findings, then the procedure report has been included in a sealed envelope for you to review at your convenience later.  YOU SHOULD EXPECT: Some feelings of bloating in the abdomen. Passage of more gas than usual.  Walking can help get rid of the air that was put into your GI tract during the procedure and reduce the bloating. If you had a lower endoscopy (such as a colonoscopy or flexible sigmoidoscopy) you may notice spotting of blood in your stool or on the toilet paper. If you underwent a bowel prep for your procedure, you may not have a normal bowel movement for a few days.  Please Note:  You might notice some irritation and congestion in your nose or some drainage.  This is from the oxygen used during your procedure.  There is no need for concern and it should clear up in a day or so.  SYMPTOMS TO REPORT IMMEDIATELY:  Following upper endoscopy (EGD)  Vomiting of blood or coffee ground material  New chest pain or pain under the shoulder blades  Painful or persistently difficult swallowing  New shortness of breath  Fever of 100F or higher  Black, tarry-looking stools For urgent or emergent issues, a gastroenterologist can be reached at any hour by calling [phone]. Do not use MyChart messaging for urgent concerns.   DIET:  POST DILATION DIET.  We do recommend a  small meal at first, but then you may proceed to your regular diet.  Drink plenty of fluids but you should avoid alcoholic beverages for 24 hours.  ACTIVITY:  You should plan to take it easy for the rest of today and you should NOT DRIVE or use heavy machinery until tomorrow (because of the sedation medicines used during the test).    FOLLOW UP: Our staff will call the number listed on your records the next business day following your procedure.  We will call around 7:15- 8:00 am to check on you and address any questions or concerns that you may have regarding the information given to you following your procedure. If we do not reach you, we will leave a message.      SIGNATURES/CONFIDENTIALITY: You and/or your care partner have signed paperwork which will be entered into your electronic medical record.  These signatures attest to the fact that that the information above on your After Visit Summary has been reviewed and is understood.  Full responsibility of the confidentiality of this discharge information lies with you and/or your care-partner.

## 2024-12-10 NOTE — Progress Notes (Signed)
 HISTORY OF PRESENT ILLNESS:  Donna Dillon is a 79 y.o. female with a history of GERD complicated by peptic stricture for which she underwent esophageal dilation August 18, 2024.  Contact the office with recurrent dysphagia and transient food impaction.  Now for repeat EGD with dilation.  REVIEW OF SYSTEMS:  All non-GI ROS negative except for  Past Medical History:  Diagnosis Date   Allergy    Anemia    mild   Anxiety    Cancer (HCC)    right lung cancer   Colon polyp    hyperplastic   Diverticulosis    Esophageal stricture    GERD (gastroesophageal reflux disease)    no s/s now- off medicines   Hyperlipidemia    Palpitations    Pneumonia    PONV (postoperative nausea and vomiting)    Prediabetes    Tachycardia     Past Surgical History:  Procedure Laterality Date   BREAST BIOPSY Left    BRONCHIAL NEEDLE ASPIRATION BIOPSY  12/25/2022   Procedure: BRONCHIAL NEEDLE ASPIRATION BIOPSIES;  Surgeon: Gladis Leonor HERO, MD;  Location: [facility] ENDOSCOPY;  Service: Pulmonary;;   CESAREAN SECTION     x1   COLONOSCOPY  2017   eye lift     EYE SURGERY     lasix   FIDUCIAL MARKER PLACEMENT  12/25/2022   Procedure: FIDUCIAL DYE MARKING;  Surgeon: Gladis Leonor HERO, MD;  Location: [facility] ENDOSCOPY;  Service: Pulmonary;;   INTERCOSTAL NERVE BLOCK Right 12/25/2022   Procedure: INTERCOSTAL NERVE BLOCK;  Surgeon: Kerrin Elspeth BROCKS, MD;  Location: [facility] OR;  Service: Thoracic;  Laterality: Right;   NODE DISSECTION Right 12/25/2022   Procedure: NODE DISSECTION;  Surgeon: Kerrin Elspeth BROCKS, MD;  Location: [facility] OR;  Service: Thoracic;  Laterality: Right;   POLYPECTOMY     TONSILLECTOMY     UPPER GASTROINTESTINAL ENDOSCOPY      Social History Donna Dillon  reports that she has never smoked. She has been exposed to tobacco smoke. She has never used smokeless tobacco. She reports that she does not drink alcohol  and does not use drugs.  family history includes Colon cancer in her  paternal aunt; Emphysema in her father; Heart disease in her mother; Hypertension in her mother; Kidney cancer in her father.  Allergies[1]     PHYSICAL EXAMINATION: Vital signs: BP 108/64   Pulse 64   Temp (!) 97.5 F (36.4 C) (Temporal)   Ht 5' 6.5 (1.689 m)   Wt 160 lb (72.6 kg)   LMP 12/09/1994   SpO2 97%   BMI 25.44 kg/m  General: Well-developed, well-nourished, no acute distress HEENT: Sclerae are anicteric, conjunctiva pink. Oral mucosa intact Lungs: Clear Heart: Regular Abdomen: soft, nontender, nondistended, no obvious ascites, no peritoneal signs, normal bowel sounds. No organomegaly. Extremities: No edema Psychiatric: alert and oriented x3. Cooperative     ASSESSMENT:  GERD complicated by stricture.  Now with recurrent dysphagia and transient food impaction.   PLAN:  EGD with dilation         [1]  Allergies Allergen Reactions   Macrolides And Ketolides Nausea And Vomiting   Paxil [Paroxetine Hcl] Nausea And Vomiting   Penicillins Hives and Itching   Prednisone Other (See Comments)    Altered mental state   Asa [Aspirin] Diarrhea   Levaquin  [Levofloxacin ] Other (See Comments)    Joint Pain   Sulfa Antibiotics Rash   Tetracyclines & Related Nausea And Vomiting   Trimethoprim Rash   Zoloft [Sertraline  Hcl] Nausea And Vomiting

## 2024-12-10 NOTE — Progress Notes (Signed)
 Called to room to assist during endoscopic procedure.  Patient ID and intended procedure confirmed with present staff. Received instructions for my participation in the procedure from the performing physician.

## 2024-12-10 NOTE — Telephone Encounter (Signed)
 Message sent to prior auth team this am.

## 2024-12-10 NOTE — Telephone Encounter (Signed)
 No PA required for egd, pt aware and knows to arrive at 10:30am.

## 2024-12-13 ENCOUNTER — Telehealth: Payer: Self-pay | Admitting: *Deleted

## 2024-12-13 NOTE — Telephone Encounter (Signed)
 No answer on follow up call. Left message.

## 2024-12-28 ENCOUNTER — Ambulatory Visit: Admitting: Internal Medicine

## 2025-01-25 ENCOUNTER — Other Ambulatory Visit

## 2025-01-25 ENCOUNTER — Ambulatory Visit (HOSPITAL_COMMUNITY)

## 2025-02-01 ENCOUNTER — Ambulatory Visit: Admitting: Internal Medicine

## 2025-03-01 ENCOUNTER — Ambulatory Visit: Admitting: Internal Medicine

## 2025-05-23 ENCOUNTER — Ambulatory Visit: Admitting: Psychiatry
